# Patient Record
Sex: Male | Born: 1970 | Race: White | Hispanic: No | Marital: Single | Smoking: Current every day smoker
Health system: Southern US, Community
[De-identification: ages and names within clinical notes are randomized; demographics above are authoritative.]

## PROBLEM LIST (undated history)

## (undated) DIAGNOSIS — M549 Dorsalgia, unspecified: Secondary | ICD-10-CM

## (undated) DIAGNOSIS — C8590 Non-Hodgkin lymphoma, unspecified, unspecified site: Secondary | ICD-10-CM

## (undated) DIAGNOSIS — K219 Gastro-esophageal reflux disease without esophagitis: Secondary | ICD-10-CM

## (undated) DIAGNOSIS — C801 Malignant (primary) neoplasm, unspecified: Secondary | ICD-10-CM

## (undated) DIAGNOSIS — C8330 Diffuse large B-cell lymphoma, unspecified site: Secondary | ICD-10-CM

## (undated) DIAGNOSIS — IMO0002 Reserved for concepts with insufficient information to code with codable children: Secondary | ICD-10-CM

## (undated) DIAGNOSIS — M542 Cervicalgia: Secondary | ICD-10-CM

## (undated) DIAGNOSIS — IMO0001 Reserved for inherently not codable concepts without codable children: Secondary | ICD-10-CM

## (undated) DIAGNOSIS — S149XXA Injury of unspecified nerves of neck, initial encounter: Secondary | ICD-10-CM

## (undated) DIAGNOSIS — J189 Pneumonia, unspecified organism: Secondary | ICD-10-CM

## (undated) DIAGNOSIS — M898X9 Other specified disorders of bone, unspecified site: Secondary | ICD-10-CM

## (undated) DIAGNOSIS — G629 Polyneuropathy, unspecified: Secondary | ICD-10-CM

## (undated) DIAGNOSIS — G8929 Other chronic pain: Secondary | ICD-10-CM

## (undated) DIAGNOSIS — R1310 Dysphagia, unspecified: Secondary | ICD-10-CM

## (undated) DIAGNOSIS — G971 Other reaction to spinal and lumbar puncture: Secondary | ICD-10-CM

## (undated) DIAGNOSIS — G589 Mononeuropathy, unspecified: Secondary | ICD-10-CM

## (undated) DIAGNOSIS — R52 Pain, unspecified: Secondary | ICD-10-CM

## (undated) DIAGNOSIS — M545 Low back pain: Secondary | ICD-10-CM

## (undated) DIAGNOSIS — M792 Neuralgia and neuritis, unspecified: Secondary | ICD-10-CM

## (undated) HISTORY — DX: Reserved for concepts with insufficient information to code with codable children: IMO0002

## (undated) HISTORY — DX: Other reaction to spinal and lumbar puncture: G97.1

## (undated) HISTORY — PX: OTHER SURGICAL HISTORY: SHX169

## (undated) HISTORY — DX: Low back pain: M54.5

## (undated) HISTORY — PX: NO PAST SURGERIES: SHX2092

## (undated) HISTORY — DX: Other specified disorders of bone, unspecified site: M89.8X9

## (undated) HISTORY — DX: Reserved for inherently not codable concepts without codable children: IMO0001

## (undated) HISTORY — DX: Polyneuropathy, unspecified: G62.9

## (undated) HISTORY — DX: Neuralgia and neuritis, unspecified: M79.2

---

## 2000-08-08 ENCOUNTER — Encounter: Payer: Self-pay | Admitting: *Deleted

## 2000-08-08 ENCOUNTER — Emergency Department (HOSPITAL_COMMUNITY): Admission: EM | Admit: 2000-08-08 | Discharge: 2000-08-08 | Payer: Self-pay | Admitting: *Deleted

## 2000-08-09 ENCOUNTER — Ambulatory Visit (HOSPITAL_COMMUNITY): Admission: RE | Admit: 2000-08-09 | Discharge: 2000-08-09 | Payer: Self-pay | Admitting: Emergency Medicine

## 2000-08-09 ENCOUNTER — Encounter: Payer: Self-pay | Admitting: Emergency Medicine

## 2000-08-12 ENCOUNTER — Emergency Department (HOSPITAL_COMMUNITY): Admission: EM | Admit: 2000-08-12 | Discharge: 2000-08-12 | Payer: Self-pay | Admitting: Emergency Medicine

## 2001-11-23 ENCOUNTER — Encounter: Payer: Self-pay | Admitting: Emergency Medicine

## 2001-11-23 ENCOUNTER — Emergency Department (HOSPITAL_COMMUNITY): Admission: EM | Admit: 2001-11-23 | Discharge: 2001-11-23 | Payer: Self-pay | Admitting: Emergency Medicine

## 2001-11-24 ENCOUNTER — Ambulatory Visit (HOSPITAL_BASED_OUTPATIENT_CLINIC_OR_DEPARTMENT_OTHER): Admission: RE | Admit: 2001-11-24 | Discharge: 2001-11-24 | Payer: Self-pay | Admitting: Oral Surgery

## 2003-03-29 ENCOUNTER — Emergency Department (HOSPITAL_COMMUNITY): Admission: EM | Admit: 2003-03-29 | Discharge: 2003-03-29 | Payer: Self-pay | Admitting: Emergency Medicine

## 2005-07-15 ENCOUNTER — Emergency Department (HOSPITAL_COMMUNITY): Admission: EM | Admit: 2005-07-15 | Discharge: 2005-07-15 | Payer: Self-pay | Admitting: Emergency Medicine

## 2005-07-18 ENCOUNTER — Emergency Department (HOSPITAL_COMMUNITY): Admission: EM | Admit: 2005-07-18 | Discharge: 2005-07-18 | Payer: Self-pay | Admitting: Emergency Medicine

## 2010-07-27 ENCOUNTER — Emergency Department (HOSPITAL_COMMUNITY)
Admission: EM | Admit: 2010-07-27 | Discharge: 2010-07-27 | Disposition: A | Discharge status: Home / Self Care | Payer: Self-pay | Attending: Emergency Medicine | Admitting: Emergency Medicine

## 2010-07-27 DIAGNOSIS — F17200 Nicotine dependence, unspecified, uncomplicated: Secondary | ICD-10-CM | POA: Insufficient documentation

## 2010-07-27 DIAGNOSIS — Y92009 Unspecified place in unspecified non-institutional (private) residence as the place of occurrence of the external cause: Secondary | ICD-10-CM | POA: Insufficient documentation

## 2010-07-27 DIAGNOSIS — S0120XA Unspecified open wound of nose, initial encounter: Secondary | ICD-10-CM | POA: Insufficient documentation

## 2012-04-17 ENCOUNTER — Emergency Department (HOSPITAL_COMMUNITY)
Admission: EM | Admit: 2012-04-17 | Discharge: 2012-04-17 | Disposition: A | Discharge status: Home / Self Care | Payer: Medicaid Other | Attending: Emergency Medicine | Admitting: Emergency Medicine

## 2012-04-17 ENCOUNTER — Encounter (HOSPITAL_COMMUNITY): Payer: Self-pay | Admitting: Emergency Medicine

## 2012-04-17 ENCOUNTER — Emergency Department (HOSPITAL_COMMUNITY): Payer: Medicaid Other

## 2012-04-17 DIAGNOSIS — F17200 Nicotine dependence, unspecified, uncomplicated: Secondary | ICD-10-CM | POA: Insufficient documentation

## 2012-04-17 DIAGNOSIS — R1310 Dysphagia, unspecified: Secondary | ICD-10-CM | POA: Insufficient documentation

## 2012-04-17 DIAGNOSIS — R6883 Chills (without fever): Secondary | ICD-10-CM | POA: Insufficient documentation

## 2012-04-17 DIAGNOSIS — J36 Peritonsillar abscess: Secondary | ICD-10-CM | POA: Insufficient documentation

## 2012-04-17 LAB — POCT I-STAT, CHEM 8
Chloride: 99 mEq/L (ref 96–112)
HCT: 39 % (ref 39.0–52.0)
Hemoglobin: 13.3 g/dL (ref 13.0–17.0)
Potassium: 4.4 mEq/L (ref 3.5–5.1)
Sodium: 136 mEq/L (ref 135–145)

## 2012-04-17 LAB — CBC WITH DIFFERENTIAL/PLATELET
Basophils Absolute: 0 10*3/uL (ref 0.0–0.1)
Basophils Relative: 0 % (ref 0–1)
Hemoglobin: 13.2 g/dL (ref 13.0–17.0)
MCHC: 34.1 g/dL (ref 30.0–36.0)
Monocytes Relative: 8 % (ref 3–12)
Neutro Abs: 5.5 10*3/uL (ref 1.7–7.7)
Neutrophils Relative %: 74 % (ref 43–77)
Platelets: 281 10*3/uL (ref 150–400)

## 2012-04-17 MED ORDER — HYDROMORPHONE HCL PF 1 MG/ML IJ SOLN
1.0000 mg | Freq: Once | INTRAMUSCULAR | Status: AC
  Administered 2012-04-17: 1 mg via INTRAVENOUS
  Filled 2012-04-17: qty 1

## 2012-04-17 MED ORDER — TRAMADOL HCL 50 MG PO TABS: 50.0000 mg | ORAL_TABLET | Freq: Four times a day (QID) | ORAL | Status: DC | PRN

## 2012-04-17 MED ORDER — PREDNISONE 20 MG PO TABS: ORAL_TABLET | ORAL | Status: DC

## 2012-04-17 MED ORDER — DEXAMETHASONE SODIUM PHOSPHATE 10 MG/ML IJ SOLN
10.0000 mg | Freq: Once | INTRAMUSCULAR | Status: AC
  Administered 2012-04-17: 10 mg via INTRAVENOUS
  Filled 2012-04-17: qty 1

## 2012-04-17 MED ORDER — AMOXICILLIN 500 MG PO CAPS: 500.0000 mg | ORAL_CAPSULE | Freq: Three times a day (TID) | ORAL | Status: DC

## 2012-04-17 MED ORDER — DEXTROSE 5 % IV SOLN
750.0000 mg | Freq: Once | INTRAVENOUS | Status: AC
  Administered 2012-04-17: 750 mg via INTRAVENOUS
  Filled 2012-04-17: qty 750

## 2012-04-17 MED ORDER — IOHEXOL 300 MG/ML  SOLN
75.0000 mL | Freq: Once | INTRAMUSCULAR | Status: AC | PRN
  Administered 2012-04-17: 75 mL via INTRAVENOUS

## 2012-04-17 MED ORDER — SODIUM CHLORIDE 0.9 % IV BOLUS (SEPSIS)
1000.0000 mL | Freq: Once | INTRAVENOUS | Status: AC
  Administered 2012-04-17: 1000 mL via INTRAVENOUS

## 2012-04-17 NOTE — ED Provider Notes (Signed)
Medical screening examination/treatment/procedure(s) were performed by non-physician practitioner and as supervising physician I was immediately available for consultation/collaboration.   Dione Booze, MD 04/17/12 2024

## 2012-04-17 NOTE — ED Provider Notes (Signed)
History    This chart was scribed for non-physician practitioner working with Dione Booze, MD by Donne Anon, ED Scribe. This patient was seen in room TR11C/TR11C and the patient's care was started at 1632.   CSN: [id number]  Arrival date & time 04/17/12  1425   First MD Initiated Contact with Patient 04/17/12 1632      Chief Complaint  Patient presents with  . Sore Throat     The history is provided by the patient. No language interpreter was used.   Nicholas King is a 42 y.o. male who presents to the Emergency Department complaining of gradual onset, gradually worsening, throat pain which began a few days ago. He was seen at a hospital in [facility] 2 days ago where he was diagnosed "pus pockets" on his tonsils, verified by CT scan. They did not drain anything and he was referred to an ENT. He was prescribed hydrocodone and clindamycin and reports little relief. He reports associated chills and difficulty swallowing.  History reviewed. No pertinent past medical history.  Past Surgical History  Procedure Laterality Date  . Brain surgery      History reviewed. No pertinent family history.  History  Substance Use Topics  . Smoking status: Current Every Day Smoker    Types: Cigarettes  . Smokeless tobacco: Not on file  . Alcohol Use: Yes     Comment: 48oz beer/day      Review of Systems  Constitutional: Positive for chills. Negative for fever.  HENT: Positive for sore throat and trouble swallowing.   Gastrointestinal: Negative for vomiting.  All other systems reviewed and are negative.    Allergies  Review of patient's allergies indicates no known allergies.  Home Medications  No current outpatient prescriptions on file.  BP 126/85  Pulse 88  Temp(Src) 97.9 F (36.6 C) (Oral)  Resp 18  Wt 135 lb (61.236 kg)  SpO2 99%  Physical Exam  Nursing note and vitals reviewed. Constitutional: He is oriented to person, place, and time. He appears well-developed  and well-nourished. No distress.  Muffled voice.  HENT:  Head: Normocephalic and atraumatic.  Mouth/Throat: Oropharyngeal exudate present.  Tonsils enlarged and inflamed bilateral with exudate. White coating with an odor covering entire oropharynx.  Eyes: Conjunctivae and EOM are normal.  Neck: Normal range of motion. Neck supple.  Cardiovascular: Normal rate, regular rhythm and normal heart sounds.   Pulmonary/Chest: Effort normal and breath sounds normal.  Abdominal: Soft. Bowel sounds are normal. He exhibits no distension.  Musculoskeletal: Normal range of motion. He exhibits no edema.  Neurological: He is alert and oriented to person, place, and time.  Skin: Skin is warm and dry.  Psychiatric: He has a normal mood and affect. His behavior is normal.    ED Course  Procedures (including critical care time) DIAGNOSTIC STUDIES: Oxygen Saturation is 99% on room air, normal by my interpretation.    COORDINATION OF CARE: 4:38 PM Discussed treatment plan which includes antibiotics and referral to ENT specialist with pt at bedside and pt agreed to plan.     Labs Reviewed - No data to display Ct Soft Tissue Neck W Contrast  04/17/2012  *RADIOLOGY REPORT*  Clinical Data: Sore throat.  Rule out tonsillar abscess  CT NECK WITH CONTRAST  Technique:  Multidetector CT imaging of the neck was performed with intravenous contrast.  Contrast: 75mL OMNIPAQUE IOHEXOL 300 MG/ML  SOLN  Comparison: None.  Findings: There is asymmetric soft tissue swelling of the left tonsil.  No abscess is present.  Right tonsil is normal.  No pathologic adenopathy in the neck.  Scattered lymph nodes are present in the neck bilaterally without abscess formation or pathologic enlargement.  Parotid and submandibular glands are normal bilaterally.  Larynx is normal  13 mm irregular nodule right lower thyroid.  This could be benign and malignant.  Thyroid ultrasound is suggested for further evaluation.  Left thyroid is normal.   Lung apices are clear.  No acute bony abnormality.  IMPRESSION: Extensive soft tissue swelling of the left tonsil region without abscess.  Shotty lymph nodes in the neck bilaterally.  13 mm irregular nodule right thyroid.  Recommend thyroid ultrasound for further evaluation.   Original Report Authenticated By: Janeece Riggers, M.D.      1. Tonsillar abscess       MDM  CT scan from [facility] on March 3 showing left greater than right retropharyngeal abscess with probable rupture of left tonsillar abscess. On March 11 repeat CT scan shows left tonsillar enlargement and edema improved somewhat, otherwise stable appearance compared to March 3. No significant improvement in the soft tissue fullness in edema involving the left retropharyngeal space. Possibly retropharyngeal adenopathy or retropharyngeal abscess. I discussed this with Dr. Preston Fleeting and we feel a repeat CT scan today is appropriate. He has been on clindamycin without any change. I will begin IV cefuroxime. Am checking labs including CBC with differential and a mastectomy. Pending CT scan, I will consult ENT. 8:12 PM CT scan showing extensive soft tissue swelling of the left tonsil, however no abscess present. This is improvement from scan obtained on 3/11. Patient already aware of the thyroid nodule which were also noted on prior CT scan obtained earlier this month. He received IV antibiotics along with IV dexamethasone. He is able to speak in full sentences without difficulty and swallowing secretions well. I will discharge him home with amoxicillin and prednisone. He will followup with ENT. I discussed this case with Dr. Preston Fleeting who agrees with plan of care. No need for ENT to consult in the emergency department at this time. Return precautions discussed. Patient states understanding of plan and is agreeable. I personally performed the services described in this documentation, which was scribed in my presence. The recorded information  has been reviewed and is accurate.         Trevor Mace, PA-C 04/17/12 2014

## 2012-04-17 NOTE — ED Notes (Signed)
Pt seen at [facility] twice in last 2 wks with recommendation to have tonsils removed. Pt here for second opinion/follow up. Able to speak in complete sentences, good airway maintained.

## 2012-04-17 NOTE — ED Notes (Signed)
Pt states that he was visiting a friend out of town and went to the hospital dx with peritonsillar abscess and told to followup with ENT. Arrives stating he needs referral for an ENT here and sx have not begun to improve.

## 2012-04-20 ENCOUNTER — Emergency Department (HOSPITAL_COMMUNITY)
Admission: EM | Admit: 2012-04-20 | Discharge: 2012-04-20 | Disposition: A | Discharge status: Home / Self Care | Payer: Medicaid Other | Attending: Emergency Medicine | Admitting: Emergency Medicine

## 2012-04-20 ENCOUNTER — Encounter (HOSPITAL_COMMUNITY): Payer: Self-pay

## 2012-04-20 DIAGNOSIS — R21 Rash and other nonspecific skin eruption: Secondary | ICD-10-CM | POA: Insufficient documentation

## 2012-04-20 DIAGNOSIS — F17200 Nicotine dependence, unspecified, uncomplicated: Secondary | ICD-10-CM | POA: Insufficient documentation

## 2012-04-20 DIAGNOSIS — Z79899 Other long term (current) drug therapy: Secondary | ICD-10-CM | POA: Insufficient documentation

## 2012-04-20 DIAGNOSIS — R059 Cough, unspecified: Secondary | ICD-10-CM | POA: Insufficient documentation

## 2012-04-20 DIAGNOSIS — T4995XA Adverse effect of unspecified topical agent, initial encounter: Secondary | ICD-10-CM | POA: Insufficient documentation

## 2012-04-20 DIAGNOSIS — J390 Retropharyngeal and parapharyngeal abscess: Secondary | ICD-10-CM | POA: Insufficient documentation

## 2012-04-20 LAB — BASIC METABOLIC PANEL
BUN: 7 mg/dL (ref 6–23)
CO2: 29 mEq/L (ref 19–32)
Chloride: 96 mEq/L (ref 96–112)
Creatinine, Ser: 0.67 mg/dL (ref 0.50–1.35)
Potassium: 3.8 mEq/L (ref 3.5–5.1)

## 2012-04-20 LAB — CBC WITH DIFFERENTIAL/PLATELET
HCT: 37.5 % — ABNORMAL LOW (ref 39.0–52.0)
Hemoglobin: 12.3 g/dL — ABNORMAL LOW (ref 13.0–17.0)
Lymphocytes Relative: 7 % — ABNORMAL LOW (ref 12–46)
Monocytes Absolute: 0.2 10*3/uL (ref 0.1–1.0)
Monocytes Relative: 3 % (ref 3–12)
Neutro Abs: 6.9 10*3/uL (ref 1.7–7.7)
Neutrophils Relative %: 90 % — ABNORMAL HIGH (ref 43–77)
RBC: 4.44 MIL/uL (ref 4.22–5.81)
WBC: 7.7 10*3/uL (ref 4.0–10.5)

## 2012-04-20 MED ORDER — CEPHALEXIN 500 MG PO CAPS: 500.0000 mg | ORAL_CAPSULE | Freq: Two times a day (BID) | ORAL | Status: DC

## 2012-04-20 MED ORDER — DIPHENHYDRAMINE HCL 25 MG PO CAPS: 25.0000 mg | ORAL_CAPSULE | Freq: Four times a day (QID) | ORAL | Status: DC

## 2012-04-20 MED ORDER — SULFAMETHOXAZOLE-TMP DS 800-160 MG PO TABS
1.0000 | ORAL_TABLET | Freq: Once | ORAL | Status: AC
  Administered 2012-04-20: 1 via ORAL
  Filled 2012-04-20: qty 1

## 2012-04-20 MED ORDER — FAMOTIDINE 20 MG PO TABS: 20.0000 mg | ORAL_TABLET | Freq: Two times a day (BID) | ORAL | Status: DC

## 2012-04-20 MED ORDER — SULFAMETHOXAZOLE-TRIMETHOPRIM 800-160 MG PO TABS: 1.0000 | ORAL_TABLET | Freq: Two times a day (BID) | ORAL | Status: DC

## 2012-04-20 MED ORDER — LIDOCAINE VISCOUS 2 % MT SOLN: 20.0000 mL | OROMUCOSAL | Status: DC | PRN

## 2012-04-20 MED ORDER — FAMOTIDINE IN NACL 20-0.9 MG/50ML-% IV SOLN
20.0000 mg | Freq: Once | INTRAVENOUS | Status: AC
  Administered 2012-04-20: 20 mg via INTRAVENOUS
  Filled 2012-04-20: qty 50

## 2012-04-20 MED ORDER — LIDOCAINE VISCOUS 2 % MT SOLN
20.0000 mL | Freq: Once | OROMUCOSAL | Status: AC
  Administered 2012-04-20: 20 mL via OROMUCOSAL
  Filled 2012-04-20: qty 15

## 2012-04-20 MED ORDER — METHYLPREDNISOLONE SODIUM SUCC 125 MG IJ SOLR
125.0000 mg | Freq: Once | INTRAMUSCULAR | Status: AC
  Administered 2012-04-20: 125 mg via INTRAVENOUS
  Filled 2012-04-20: qty 2

## 2012-04-20 MED ORDER — CEPHALEXIN 500 MG PO CAPS
500.0000 mg | ORAL_CAPSULE | Freq: Once | ORAL | Status: AC
  Administered 2012-04-20: 500 mg via ORAL
  Filled 2012-04-20: qty 1

## 2012-04-20 MED ORDER — SODIUM CHLORIDE 0.9 % IV SOLN
Freq: Once | INTRAVENOUS | Status: AC
  Administered 2012-04-20: 21:00:00 via INTRAVENOUS

## 2012-04-20 NOTE — ED Provider Notes (Signed)
History     CSN: [id number]  Arrival date & time 04/20/12  1821   First MD Initiated Contact with Patient 04/20/12 1954      Chief Complaint  Patient presents with  . Allergic Reaction    (Consider location/radiation/quality/duration/timing/severity/associated sxs/prior treatment) HPI  The patient presents 3 days after an initial presentation to our network for sore throat, now with concern of continuing sore throat, as well as diffuse rash.  Prior to 2 weeks ago the patient was in his usual state of health.  About that time he developed sore throat, full sensation.  He went to another facility in an other area of the state.  He was diagnosed with bilateral retropharyngeal abscesses and started on oral antibiotics.  He notes that in the interval he continued to have sore throat, but no new fever, no new chest pain, dyspnea, syncope, vomiting, diarrhea. He was advised to follow up with an ENT specialist, but did not do so 3 days ago the patient started having a scattered, erythematous, pruritic rash on his left knee.  That day he presented to our other facility to 2 concerns of lack of progress of his sore throat.  That evaluation included a CT scan, which demonstrated diffuse inflammatory changes in the neck, patent airway, concern for infection.  Given the patient's patent airway, the absence of distress he was discharged with outpatient ENT followup. Now, over the past 2 days he developed a diffuse pruritic erythematous scattered minimally raised rash.  There are no lesions in the mucosa.  There is no fever, there is no dyspnea, there is no confusion.  No change with Benadryl or steroids.  The patient had been prescribed amoxicillin, and took one dose.  However, the patient states that his rash began prior to that individual dose.  History reviewed. No pertinent past medical history.  Past Surgical History  Procedure Laterality Date  . Brain surgery      No family history on  file.  History  Substance Use Topics  . Smoking status: Current Every Day Smoker    Types: Cigarettes  . Smokeless tobacco: Not on file  . Alcohol Use: Yes     Comment: 48oz beer/day      Review of Systems  Constitutional:       Per HPI, otherwise negative  HENT:       Per HPI, otherwise negative  Respiratory:       Per HPI, otherwise negative  Cardiovascular:       Per HPI, otherwise negative  Gastrointestinal: Negative for vomiting.  Endocrine:       Negative aside from HPI  Genitourinary:       Neg aside from HPI   Musculoskeletal:       Per HPI, otherwise negative  Skin: Positive for rash.  Neurological: Negative for syncope.    Allergies  Hydrocodone  Home Medications   Current Outpatient Rx  Name  Route  Sig  Dispense  Refill  . amoxicillin (AMOXIL) 500 MG capsule   Oral   Take 1 capsule (500 mg total) by mouth 3 (three) times daily.   21 capsule   0   . clindamycin (CLEOCIN) 150 MG capsule   Oral   Take 150-300 mg by mouth See admin instructions. 2 capsules three times a day for 2 days then 1 capsule 4 times a day for 8 days         . HYDROcodone-acetaminophen (NORCO/VICODIN) 5-325 MG per tablet   Oral  Take 1 tablet by mouth every 6 (six) hours as needed for pain.         . predniSONE (DELTASONE) 20 MG tablet      Take 3 tabs PO x 2 days followed by 2 tabs PO x 2 days followed by 1 tab PO x 2 days   12 tablet   0   . Pseudoeph-Doxylamine-DM-APAP (DAYQUIL/NYQUIL COLD/FLU RELIEF PO)   Oral   Take 1 capsule by mouth 2 (two) times daily as needed (for cold/flu symptoms).         . traMADol (ULTRAM) 50 MG tablet   Oral   Take 1 tablet (50 mg total) by mouth every 6 (six) hours as needed for pain.   15 tablet   0     BP 161/103  Pulse 114  Temp(Src) 98 F (36.7 C) (Oral)  Resp 24  Ht 5\' 8"  (1.727 m)  Wt 130 lb (58.968 kg)  BMI 19.77 kg/m2  SpO2 100%  Physical Exam  Nursing note and vitals reviewed. Constitutional: He is  oriented to person, place, and time. He appears well-developed and well-nourished.  Very thin male who appears older than stated age  HENT:  Head: Normocephalic and atraumatic.  Mouth/Throat: Oropharyngeal exudate present.  Tonsils enlarged and inflamed bilateral with exudate. White coating with an odor covering entire oropharynx.  Eyes: Conjunctivae and EOM are normal.  Neck: Neck supple. No tracheal deviation present. No thyromegaly present.  Cardiovascular: Normal rate, regular rhythm and normal heart sounds.   Pulmonary/Chest: Effort normal and breath sounds normal. No stridor.  Abdominal: Soft. Bowel sounds are normal. He exhibits no distension.  Musculoskeletal: Normal range of motion. He exhibits no edema.  Lymphadenopathy:    He has cervical adenopathy.  Neurological: He is alert and oriented to person, place, and time.  Skin: Skin is warm and dry. Rash noted. No purpura noted. Rash is maculopapular. Rash is not nodular and not vesicular. No cyanosis. Nails show no clubbing.  Diffuse maculopapular rash, with areas of confluence.  No drainage, no mucosal lesions  Psychiatric: He has a normal mood and affect. His behavior is normal.    ED Course  Procedures (including critical care time)  Following initial evaluation I reviewed the patient's chart, including looking at the CT scan from 3 days ago.  The patient has prior evidence of retropharyngeal abscess, though with seeming improvement of the abscess, with development of diffuse inflammatory changes.   Labs Reviewed  CBC WITH DIFFERENTIAL  BASIC METABOLIC PANEL  C-REACTIVE PROTEIN  LACTIC ACID, PLASMA   No results found.   No diagnosis found.  On repeat exam the patient's rash appears better with diminished erythema.  He is eating a cheeseburger.  10:28 PM Patient remains in no distress.  I shared all results with him and his family.  We discussed the working differential diagnosis and the need for f/u / return  precautions.  MDM  This patient presents with concern of rash, while also having a ongoing oral pharyngeal infection.  On exam the patient is afebrile, with clear phonation, no dysphagia, no dyspnea.  The patient is mildly tachycardic, though this improved with IV fluids.  Given his presentation there suspicion for bacteremia versus allergic reaction versus infectious exanthem.  With improvement following IV fluids, antihistamines, steroids, and no decompensation, with no leukocytosis, no lactic acidosis, no fever, there is low suspicion for acute systemic illness, the patient does need to followup with ENT, as scheduled for tomorrow.  Gerhard Munch, MD 04/20/12 2229

## 2012-04-20 NOTE — ED Notes (Addendum)
Patient presents to ER with c/o of an allergic reaction. Pt has significant hives/rash all over body that started on Thursday night. Pt began taking hydrocodone early last week but only took 2 pills. Pt went to [facility] this past Thursday night for a throat infection and was given amoxicillin and prednisone and since then the rash has spread all over his body. Pt is itching and burning all over. Denies chest pain. No audible wheezing.

## 2012-04-21 LAB — C-REACTIVE PROTEIN: CRP: 2.9 mg/dL — ABNORMAL HIGH

## 2012-04-28 ENCOUNTER — Encounter (HOSPITAL_COMMUNITY): Payer: Self-pay | Admitting: Pharmacy Technician

## 2012-04-28 ENCOUNTER — Other Ambulatory Visit: Payer: Self-pay | Admitting: Otolaryngology

## 2012-05-02 NOTE — Pre-Procedure Instructions (Signed)
ROLLO King  05/02/2012   Your procedure is scheduled on:  Thursday, April 3rd  Report to [facility] at 0530 AM.  Call this number if you have problems the morning of surgery: [phone]   Remember:   Do not eat food or drink liquids after midnight.    Take these medicines the morning of surgery with A SIP OF WATER: Prednisone, Pepcid   Do not wear jewelry, make-up or nail polish.  Do not wear lotions, powders, or perfumes,deodorant.  Do not shave 48 hours prior to surgery. Men may shave face and neck.  Do not bring valuables to the hospital.  Contacts, dentures or bridgework may not be worn into surgery.  Leave suitcase in the car. After surgery it may be brought to your room.  For patients admitted to the hospital, checkout time is 11:00 AM the day of discharge.   Patients discharged the day of surgery will not be allowed to drive home.    Special Instructions: Shower using CHG 2 nights before surgery and the night before surgery.  If you shower the day of surgery use CHG.  Use special wash - you have one bottle of CHG for all showers.  You should use approximately 1/3 of the bottle for each shower.   Please read over the following fact sheets that you were given: Pain Booklet, Coughing and Deep Breathing and Surgical Site Infection Prevention

## 2012-05-05 ENCOUNTER — Encounter (HOSPITAL_COMMUNITY)
Admission: RE | Admit: 2012-05-05 | Discharge: 2012-05-05 | Disposition: A | Discharge status: Home / Self Care | Payer: Medicaid Other | Source: Ambulatory Visit | Attending: Otolaryngology | Admitting: Otolaryngology

## 2012-05-05 ENCOUNTER — Encounter (HOSPITAL_COMMUNITY): Payer: Self-pay

## 2012-05-05 HISTORY — DX: Gastro-esophageal reflux disease without esophagitis: K21.9

## 2012-05-05 LAB — COMPREHENSIVE METABOLIC PANEL
ALT: 22 U/L (ref 0–53)
AST: 36 U/L (ref 0–37)
Albumin: 2.8 g/dL — ABNORMAL LOW (ref 3.5–5.2)
Alkaline Phosphatase: 51 U/L (ref 39–117)
Chloride: 101 mEq/L (ref 96–112)
Potassium: 4.7 mEq/L (ref 3.5–5.1)
Sodium: 139 mEq/L (ref 135–145)
Total Bilirubin: 0.3 mg/dL (ref 0.3–1.2)

## 2012-05-05 LAB — CBC
Platelets: 180 10*3/uL (ref 150–400)
RBC: 4.73 MIL/uL (ref 4.22–5.81)
RDW: 14.9 % (ref 11.5–15.5)
WBC: 5.2 10*3/uL (ref 4.0–10.5)

## 2012-05-05 LAB — SURGICAL PCR SCREEN: Staphylococcus aureus: NEGATIVE

## 2012-05-05 NOTE — Progress Notes (Addendum)
Primary Physician - Does not have a primary physician Does not have a cardiologist  No prior cardiac workup  Patient states he has lost 30 pounds over the last month and multiple questions regarding upcoming surgery and severe pain in throat.  Encouraged patient to call Dr. Jearld Fenton office.

## 2012-05-08 ENCOUNTER — Encounter (HOSPITAL_COMMUNITY): Payer: Self-pay | Admitting: Anesthesiology

## 2012-05-08 ENCOUNTER — Encounter (HOSPITAL_COMMUNITY): Payer: Self-pay | Admitting: *Deleted

## 2012-05-08 ENCOUNTER — Ambulatory Visit (HOSPITAL_COMMUNITY)
Admission: RE | Admit: 2012-05-08 | Discharge: 2012-05-08 | Disposition: A | Discharge status: Home / Self Care | Payer: Medicaid Other | Source: Ambulatory Visit | Attending: Otolaryngology | Admitting: Otolaryngology

## 2012-05-08 ENCOUNTER — Ambulatory Visit (HOSPITAL_COMMUNITY): Payer: Medicaid Other | Admitting: Anesthesiology

## 2012-05-08 ENCOUNTER — Encounter (HOSPITAL_COMMUNITY)
Admission: RE | Disposition: A | Discharge status: Home / Self Care | Payer: Self-pay | Source: Ambulatory Visit | Attending: Otolaryngology

## 2012-05-08 DIAGNOSIS — K219 Gastro-esophageal reflux disease without esophagitis: Secondary | ICD-10-CM | POA: Insufficient documentation

## 2012-05-08 DIAGNOSIS — J392 Other diseases of pharynx: Secondary | ICD-10-CM

## 2012-05-08 DIAGNOSIS — Z885 Allergy status to narcotic agent status: Secondary | ICD-10-CM | POA: Insufficient documentation

## 2012-05-08 DIAGNOSIS — R634 Abnormal weight loss: Secondary | ICD-10-CM | POA: Insufficient documentation

## 2012-05-08 DIAGNOSIS — C8589 Other specified types of non-Hodgkin lymphoma, extranodal and solid organ sites: Secondary | ICD-10-CM | POA: Insufficient documentation

## 2012-05-08 DIAGNOSIS — Z79899 Other long term (current) drug therapy: Secondary | ICD-10-CM | POA: Insufficient documentation

## 2012-05-08 DIAGNOSIS — F1210 Cannabis abuse, uncomplicated: Secondary | ICD-10-CM | POA: Insufficient documentation

## 2012-05-08 HISTORY — PX: DIRECT LARYNGOSCOPY: SHX5326

## 2012-05-08 SURGERY — LARYNGOSCOPY, DIRECT
Anesthesia: General | Site: Throat | Laterality: Left | Wound class: Contaminated

## 2012-05-08 MED ORDER — EPHEDRINE SULFATE 50 MG/ML IJ SOLN
INTRAMUSCULAR | Status: DC | PRN
  Administered 2012-05-08: 15 mg via INTRAVENOUS

## 2012-05-08 MED ORDER — EPINEPHRINE HCL (NASAL) 0.1 % NA SOLN
NASAL | Status: DC | PRN
  Administered 2012-05-08: 30 mL via TOPICAL

## 2012-05-08 MED ORDER — LIDOCAINE HCL (CARDIAC) 20 MG/ML IV SOLN
INTRAVENOUS | Status: DC | PRN
  Administered 2012-05-08: 70 mg via INTRAVENOUS

## 2012-05-08 MED ORDER — EPINEPHRINE HCL (NASAL) 0.1 % NA SOLN
NASAL | Status: AC
  Filled 2012-05-08: qty 30

## 2012-05-08 MED ORDER — DEXAMETHASONE SODIUM PHOSPHATE 4 MG/ML IJ SOLN
INTRAMUSCULAR | Status: DC | PRN
Start: 2012-05-08 — End: 2012-05-08
  Administered 2012-05-08: 8 mg via INTRAVENOUS

## 2012-05-08 MED ORDER — OXYCODONE-ACETAMINOPHEN 7.5-325 MG PO TABS: 1.0000 | ORAL_TABLET | ORAL | Status: DC | PRN

## 2012-05-08 MED ORDER — MIDAZOLAM HCL 5 MG/5ML IJ SOLN
INTRAMUSCULAR | Status: DC | PRN
  Administered 2012-05-08: 2 mg via INTRAVENOUS

## 2012-05-08 MED ORDER — HYDROMORPHONE HCL PF 1 MG/ML IJ SOLN
0.2500 mg | INTRAMUSCULAR | Status: DC | PRN
  Administered 2012-05-08 (×2): 0.5 mg via INTRAVENOUS

## 2012-05-08 MED ORDER — LIDOCAINE HCL 4 % MT SOLN
OROMUCOSAL | Status: DC | PRN
  Administered 2012-05-08: 4 mL via TOPICAL

## 2012-05-08 MED ORDER — FENTANYL CITRATE 0.05 MG/ML IJ SOLN
INTRAMUSCULAR | Status: DC | PRN
  Administered 2012-05-08: 125 ug via INTRAVENOUS

## 2012-05-08 MED ORDER — FENTANYL CITRATE 0.05 MG/ML IJ SOLN: 50.0000 ug | Freq: Once | INTRAMUSCULAR | Status: DC

## 2012-05-08 MED ORDER — ARTIFICIAL TEARS OP OINT
TOPICAL_OINTMENT | OPHTHALMIC | Status: DC | PRN
  Administered 2012-05-08: 1 via OPHTHALMIC

## 2012-05-08 MED ORDER — PROPOFOL 10 MG/ML IV BOLUS
INTRAVENOUS | Status: DC | PRN
  Administered 2012-05-08: 100 mg via INTRAVENOUS
  Administered 2012-05-08: 50 mg via INTRAVENOUS
  Administered 2012-05-08: 30 mg via INTRAVENOUS
  Administered 2012-05-08: 50 mg via INTRAVENOUS

## 2012-05-08 MED ORDER — HYDROMORPHONE HCL PF 1 MG/ML IJ SOLN
INTRAMUSCULAR | Status: AC
  Filled 2012-05-08: qty 1

## 2012-05-08 MED ORDER — SUCCINYLCHOLINE CHLORIDE 20 MG/ML IJ SOLN
INTRAMUSCULAR | Status: DC | PRN
  Administered 2012-05-08: 100 mg via INTRAVENOUS

## 2012-05-08 MED ORDER — OXYMETAZOLINE HCL 0.05 % NA SOLN
NASAL | Status: AC
  Filled 2012-05-08: qty 15

## 2012-05-08 MED ORDER — MIDAZOLAM HCL 2 MG/2ML IJ SOLN: 1.0000 mg | INTRAMUSCULAR | Status: DC | PRN

## 2012-05-08 MED ORDER — 0.9 % SODIUM CHLORIDE (POUR BTL) OPTIME
TOPICAL | Status: DC | PRN
  Administered 2012-05-08: 1000 mL

## 2012-05-08 MED ORDER — ONDANSETRON HCL 4 MG/2ML IJ SOLN
INTRAMUSCULAR | Status: DC | PRN
  Administered 2012-05-08: 4 mg via INTRAVENOUS

## 2012-05-08 MED ORDER — LACTATED RINGERS IV SOLN
INTRAVENOUS | Status: DC | PRN
  Administered 2012-05-08 (×2): via INTRAVENOUS

## 2012-05-08 SURGICAL SUPPLY — 31 items
BLADE SURG 15 STRL LF DISP TIS (BLADE) IMPLANT
BLADE SURG 15 STRL SS (BLADE)
CANISTER SUCTION 2500CC (MISCELLANEOUS) ×2 IMPLANT
CLOTH BEACON ORANGE TIMEOUT ST (SAFETY) ×2 IMPLANT
COAGULATOR SUCT SWTCH 10FR 6 (ELECTROSURGICAL) ×1 IMPLANT
CONT SPEC 4OZ CLIKSEAL STRL BL (MISCELLANEOUS) ×2 IMPLANT
COVER MAYO STAND STRL (DRAPES) ×3 IMPLANT
COVER TABLE BACK 60X90 (DRAPES) ×2 IMPLANT
DECANTER SPIKE VIAL GLASS SM (MISCELLANEOUS) ×2 IMPLANT
DRAPE PROXIMA HALF (DRAPES) ×2 IMPLANT
ELECT COATED BLADE 2.86 ST (ELECTRODE) IMPLANT
ELECT REM PT RETURN 9FT ADLT (ELECTROSURGICAL) ×2
ELECTRODE REM PT RTRN 9FT ADLT (ELECTROSURGICAL) IMPLANT
GLOVE BIO SURGEON STRL SZ7 (GLOVE) ×1 IMPLANT
GLOVE BIOGEL PI IND STRL 7.0 (GLOVE) IMPLANT
GLOVE BIOGEL PI INDICATOR 7.0 (GLOVE) ×1
GLOVE ECLIPSE 7.5 STRL STRAW (GLOVE) ×1 IMPLANT
GLOVE SS BIOGEL STRL SZ 7.5 (GLOVE) ×1 IMPLANT
GLOVE SUPERSENSE BIOGEL SZ 7.5 (GLOVE) ×1
GUARD TEETH (MISCELLANEOUS) ×1 IMPLANT
KIT ROOM TURNOVER OR (KITS) ×2 IMPLANT
NS IRRIG 1000ML POUR BTL (IV SOLUTION) ×2 IMPLANT
PAD ARMBOARD 7.5X6 YLW CONV (MISCELLANEOUS) ×4 IMPLANT
PATTIES SURGICAL .5 X3 (DISPOSABLE) ×2 IMPLANT
PENCIL FOOT CONTROL (ELECTRODE) IMPLANT
SPECIMEN JAR SMALL (MISCELLANEOUS) ×2 IMPLANT
SPONGE GAUZE 4X4 12PLY (GAUZE/BANDAGES/DRESSINGS) ×2 IMPLANT
SPONGE TONSIL 1 RF SGL (DISPOSABLE) ×2 IMPLANT
SURGILUBE 2OZ TUBE FLIPTOP (MISCELLANEOUS) ×2 IMPLANT
TOWEL OR 17X24 6PK STRL BLUE (TOWEL DISPOSABLE) ×4 IMPLANT
TUBE CONNECTING 12X1/4 (SUCTIONS) ×2 IMPLANT

## 2012-05-08 NOTE — Transfer of Care (Signed)
Immediate Anesthesia Transfer of Care Note  Patient: Nicholas King  Procedure(s) Performed: Procedure(s): DIRECT LARYNGOSCOPY with biopsy left tonsil (Left)  Patient Location: PACU  Anesthesia Type:General  Level of Consciousness: awake, alert , oriented and patient cooperative  Airway & Oxygen Therapy: Patient Spontanous Breathing and Patient connected to nasal cannula oxygen  Post-op Assessment: Report given to PACU RN and Post -op Vital signs reviewed and stable  Post vital signs: Reviewed and stable  Complications: No apparent anesthesia complications

## 2012-05-08 NOTE — Op Note (Signed)
Preop/postop diagnoses: Posterior and left pharyngeal mass Procedure: Direct laryngoscopy with biopsy of posterior pharyngeal mass Anesthesia: Gen. Estimated blood loss: Less than 10 cc Indications: 42 year old with a mass in his posterior pharyngeal wall that was treated with antibiotics through primary care physician and emergency room facilities. He underwent a CT scan which showed no evidence of any abscess but a mass in the left tonsil area and posterior pharyngeal wall. He's had significant pain and mostly left-sided ear pain. He is lost approximately 35 pounds. He was informed of the risks, benefits, and options of the direct laryngoscopy procedure. All his questions are answered and consent was obtained. Operation: Patient was taken to the operating room placed in the supine position after general endotracheal tube anesthesia the Dedo scope was inserted and the patient was evaluated. The hypopharynx had no evidence of any lesions. The post cricoid area looked without any lesions. The glottis had no evidence of mass on the vocal cords. The mass which was covered with an exudative material started approximately the level of the epiglottis on the posterior pharyngeal wall and extended up the lateral left pharyngeal wall then expanded cross the midline about the level the base of tongue and then extended up past the soft palate into the nasopharyngeal region. It was very firm and it did seem to be indurated type mass which means it did not have obvious exophytic tissue on its surface. This was evident once all the exudate material was suctioned off and cleaned off the surface. Biopsies were taken from the mid line mass area and sent for frozen section. Frozen section had atypical cells and they would not call it a malignancy but it had high suspicion. They wanted more tissue as a lymphoma was also suspected. Deeper biopsies were taken from the posterior pharyngeal wall with a much greater amount of tissue  harvested. The tissue seemed almost avascular as it bled very little from the biopsies. The bleeding areas were controlled with suction cautery. The examination of this area and all these biopsies was performed after placing a Crowe-Davis mouth gag. A patient of this area it did reveal the mass to extend deep and probably into the muscle area of the posterior pharyngeal wall into the left tonsil area and deep into the parapharyngeal space. Its upward extent into the nasopharynx was not assessed. He had good hemostasis after completion of the biopsies which were sent fresh and from both the left side and right side posterior pharynx. He was awake and brought to cover stable condition counts correct

## 2012-05-08 NOTE — Preoperative (Signed)
Beta Blockers   Reason not to administer Beta Blockers:Not Applicable 

## 2012-05-08 NOTE — Anesthesia Procedure Notes (Signed)
Procedure Name: Intubation Date/Time: 05/08/2012 7:48 AM Performed by: Tyrone Nine Pre-anesthesia Checklist: Patient identified, Timeout performed, Emergency Drugs available, Suction available and Patient being monitored Patient Re-evaluated:Patient Re-evaluated prior to inductionOxygen Delivery Method: Circle system utilized Preoxygenation: Pre-oxygenation with 100% oxygen Intubation Type: IV induction Ventilation: Mask ventilation without difficulty Laryngoscope Size: Mac and 3 Grade View: Grade II Tube type: Oral Tube size: 7.5 mm Number of attempts: 1 Airway Equipment and Method: Stylet Placement Confirmation: ETT inserted through vocal cords under direct vision,  positive ETCO2 and breath sounds checked- equal and bilateral Secured at: 21 cm Tube secured with: Tape Dental Injury: Teeth and Oropharynx as per pre-operative assessment  Comments: Pinched lip on intubation due to fractured carious tooth upper right.

## 2012-05-08 NOTE — Anesthesia Postprocedure Evaluation (Signed)
  Anesthesia Post-op Note  Patient: Nicholas King  Procedure(s) Performed: Procedure(s): DIRECT LARYNGOSCOPY with biopsy left tonsil (Left)  Patient Location: PACU  Anesthesia Type:General  Level of Consciousness: awake and alert   Airway and Oxygen Therapy: Patient Spontanous Breathing  Post-op Pain: mild  Post-op Assessment: Post-op Vital signs reviewed, Patient's Cardiovascular Status Stable, Respiratory Function Stable, Patent Airway, No signs of Nausea or vomiting and Pain level controlled  Post-op Vital Signs: stable  Complications: No apparent anesthesia complications

## 2012-05-08 NOTE — Anesthesia Preprocedure Evaluation (Addendum)
Anesthesia Evaluation  Patient identified by MRN, date of birth, ID band Patient awake    Reviewed: Allergy & Precautions, H&P , NPO status , Patient's Chart, lab work & pertinent test results  Airway Mallampati: I TM Distance: >3 FB Neck ROM: Limited    Dental  (+) Poor Dentition and Dental Advisory Given,    Pulmonary shortness of breath and with exertion, COPDCurrent Smoker,  + rhonchi         Cardiovascular Exercise Tolerance: Good Rhythm:Regular Rate:Normal  ? undx htn   Neuro/Psych negative neurological ROS  negative psych ROS   GI/Hepatic GERD-  Medicated and Controlled,Dysphagia   Endo/Other  negative endocrine ROS  Renal/GU negative Renal ROS     Musculoskeletal negative musculoskeletal ROS (+)   Abdominal   Peds  Hematology negative hematology ROS (+)   Anesthesia Other Findings Upper front Left central incisor and lateral incisor carious. Fragmented tooth upper lateral.  30 lb weight loss in last 2 weeks.  Difficulty swallowing.  Reproductive/Obstetrics                        Anesthesia Physical Anesthesia Plan  ASA: II  Anesthesia Plan: General   Post-op Pain Management:    Induction: Intravenous  Airway Management Planned: Oral ETT  Additional Equipment:   Intra-op Plan:   Post-operative Plan: Extubation in OR  Informed Consent: I have reviewed the patients History and Physical, chart, labs and discussed the procedure including the risks, benefits and alternatives for the proposed anesthesia with the patient or authorized representative who has indicated his/her understanding and acceptance.     Plan Discussed with: CRNA and Surgeon  Anesthesia Plan Comments:         Anesthesia Quick Evaluation

## 2012-05-08 NOTE — Progress Notes (Signed)
NOTIFIED DR. BYERS OF ORDERS NEEDING SIGNED.

## 2012-05-08 NOTE — H&P (Signed)
Nicholas King is an 42 y.o. male.   Chief Complaint: sore throat HPI: hx of mass in pharynx consistent with tumor  Past Medical History  Diagnosis Date  . GERD (gastroesophageal reflux disease)     Past Surgical History  Procedure Laterality Date  . No past surgeries      History reviewed. No pertinent family history. Social History:  reports that he has been smoking Cigarettes.  He has a 37.5 pack-year smoking history. He does not have any smokeless tobacco history on file. He reports that he uses illicit drugs (Marijuana). He reports that he does not drink alcohol.  Allergies:  Allergies  Allergen Reactions  . Hydrocodone Rash    Medications Prior to Admission  Medication Sig Dispense Refill  . cephALEXin (KEFLEX) 500 MG capsule Take 1 capsule (500 mg total) by mouth 2 (two) times daily.  14 capsule  0  . diphenhydrAMINE (BENADRYL) 25 mg capsule Take 1 capsule (25 mg total) by mouth every 6 (six) hours.  12 capsule  0  . lidocaine (XYLOCAINE) 2 % solution Take 20 mLs by mouth as needed for pain.  100 mL  0  . predniSONE (DELTASONE) 20 MG tablet Take 3 tabs PO x 2 days followed by 2 tabs PO x 2 days followed by 1 tab PO x 2 days  12 tablet  0  . sulfamethoxazole-trimethoprim (SEPTRA DS) 800-160 MG per tablet Take 1 tablet by mouth 2 (two) times daily.  14 tablet  0  . famotidine (PEPCID) 20 MG tablet Take 1 tablet (20 mg total) by mouth 2 (two) times daily.  6 tablet  0    No results found for this or any previous visit (from the past 48 hour(s)). No results found.  Review of Systems  Constitutional: Negative.   HENT: Positive for ear pain and sore throat.   Eyes: Negative.   Respiratory: Negative.   Cardiovascular: Negative.   Skin: Negative.     Blood pressure 111/73, pulse 69, temperature 97.3 F (36.3 C), temperature source Oral, resp. rate 20, SpO2 100.00%. Physical Exam  Constitutional: He appears well-developed.  HENT:  Head: Normocephalic.  Nose: Nose  normal.  Mouth/Throat: Oropharyngeal exudate present.  Large area in posterior pharynx consistent with mass  Cardiovascular: Normal rate.   Respiratory: Effort normal.     Assessment/Plan Pharyngeal mass- here for DL and have discussed the procedure  Suzanna Obey 05/08/2012, 7:29 AM

## 2012-05-09 ENCOUNTER — Encounter (HOSPITAL_COMMUNITY): Payer: Self-pay | Admitting: Otolaryngology

## 2012-05-14 ENCOUNTER — Telehealth: Payer: Self-pay | Admitting: Oncology

## 2012-05-14 NOTE — Telephone Encounter (Signed)
C/D 05/14/12 for appt. 05/15/12

## 2012-05-14 NOTE — Telephone Encounter (Signed)
S/W PT IN REF TO NP APPT. ON 05/15/12@1 :30 REFERRING DR BYERS DX-LYMPHOMA NO NEED TO SEND NP PACKET. PT COMING TOMORROW

## 2012-05-15 ENCOUNTER — Telehealth: Payer: Self-pay | Admitting: Oncology

## 2012-05-15 ENCOUNTER — Ambulatory Visit (HOSPITAL_BASED_OUTPATIENT_CLINIC_OR_DEPARTMENT_OTHER): Payer: Self-pay | Admitting: Oncology

## 2012-05-15 ENCOUNTER — Other Ambulatory Visit (HOSPITAL_BASED_OUTPATIENT_CLINIC_OR_DEPARTMENT_OTHER): Payer: Self-pay | Admitting: Lab

## 2012-05-15 ENCOUNTER — Encounter: Payer: Self-pay | Admitting: Oncology

## 2012-05-15 ENCOUNTER — Telehealth: Payer: Self-pay | Admitting: *Deleted

## 2012-05-15 ENCOUNTER — Ambulatory Visit: Payer: Self-pay

## 2012-05-15 VITALS — BP 110/70 | HR 110 | Temp 102.2°F | Resp 19 | Ht 67.0 in | Wt 96.1 lb

## 2012-05-15 DIAGNOSIS — C8589 Other specified types of non-Hodgkin lymphoma, extranodal and solid organ sites: Secondary | ICD-10-CM

## 2012-05-15 DIAGNOSIS — C8330 Diffuse large B-cell lymphoma, unspecified site: Secondary | ICD-10-CM

## 2012-05-15 DIAGNOSIS — K1379 Other lesions of oral mucosa: Secondary | ICD-10-CM

## 2012-05-15 DIAGNOSIS — E46 Unspecified protein-calorie malnutrition: Secondary | ICD-10-CM

## 2012-05-15 DIAGNOSIS — F17200 Nicotine dependence, unspecified, uncomplicated: Secondary | ICD-10-CM

## 2012-05-15 DIAGNOSIS — J36 Peritonsillar abscess: Secondary | ICD-10-CM

## 2012-05-15 LAB — LACTATE DEHYDROGENASE (CC13): LDH: 296 U/L — ABNORMAL HIGH (ref 125–245)

## 2012-05-15 MED ORDER — PREDNISONE 20 MG PO TABS: 40.0000 mg/m2 | ORAL_TABLET | Freq: Every day | ORAL | Status: DC

## 2012-05-15 MED ORDER — MORPHINE SULFATE ER 15 MG PO TBCR: 15.0000 mg | EXTENDED_RELEASE_TABLET | Freq: Two times a day (BID) | ORAL | Status: DC

## 2012-05-15 MED ORDER — OXYCODONE-ACETAMINOPHEN 7.5-325 MG PO TABS: 1.0000 | ORAL_TABLET | Freq: Four times a day (QID) | ORAL | Status: DC | PRN

## 2012-05-15 MED ORDER — LIDOCAINE-PRILOCAINE 2.5-2.5 % EX CREA: TOPICAL_CREAM | CUTANEOUS | Status: DC | PRN

## 2012-05-15 MED ORDER — ONDANSETRON HCL 8 MG PO TABS: 8.0000 mg | ORAL_TABLET | Freq: Two times a day (BID) | ORAL | Status: DC | PRN

## 2012-05-15 MED ORDER — PROCHLORPERAZINE MALEATE 10 MG PO TABS: 10.0000 mg | ORAL_TABLET | Freq: Four times a day (QID) | ORAL | Status: DC | PRN

## 2012-05-15 MED ORDER — AMOXICILLIN-POT CLAVULANATE 875-125 MG PO TABS: 1.0000 | ORAL_TABLET | Freq: Two times a day (BID) | ORAL | Status: DC

## 2012-05-15 NOTE — Telephone Encounter (Signed)
gv and printed appt schedule for pt for April...michelle added tx...gv echo order to Mrs. Bonita Quin...s/w Victorino Dike in IR she will gv msg to Marcy tomorrow and she will contact pt...pt aware cs will contact him with d/t of PET and Biopsy

## 2012-05-15 NOTE — Patient Instructions (Addendum)
1.  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   Lymphoma is not commonly known to be hereditary in most cases.  - Stage:  To be determined with further work up:  PET scan.  If PET scan shows stage 2 or 3 disease, I will then recommend bone marrow biopsy to rule out stage IV disease. - Prognosis:  To be determined with further staging work up.  The chance of cure can be up to 90% for early stage disease - Treatment:  Lymphoma is often not cured by surgery since it is often a systemic disease. Radiation if normally reserved for low grade, localized indolent lymphoma as upfront treatment or as consolidative therapy for bulky disease after chemotherapy.  - Chemotherapy:  R-CHOP chemo is given once every 3 weeks; with day #2 injection of Neulasta to decrease risk of infection.  This regimen contains:  Rituxan, Cytoxan, Adriamycin, Vincristine, Prednisone.  The first 4 agents are given intravenously on day 1 here at the [facility].  Prednisone pill is taken at home, in the morning, on days 1-5 (ONLY) of each chemo cycle.   - Potential side effects of R-CHOP which include but not limited to infusion reaction, rare seizure disorder, reactivated hepatitis, fatigue, low blood count, hair loss, mouth sore, infection, bleeding, irritated bladder lining with blood in urine, constipation, numbness and tingling of fingers and toes, secondary cancer, congestive heart failure.   - Number of cycles of chemo depends on stage/prognosis.  If stage I disease, then 3 cycles of chemo followed by radiation.  If stage III or IV disease, then chemo alone for at least 3 cycles without radiation.  - I also recommend intrathecal chemo methotrexate (in addition to R-CHOP) to decrease risk of lymphoma in the brain in the future.   - To assess response to chemo, a repeat scan will be obtained after a few cycles of chemo.   - In preparation for therapy:  * PET scan to complete staging work up.   * Bone marrow  biopsy to rule out lymphoma in bone marrow space.   * Referral to 2-D echocardiogram to assess baseline heart function.   * Referral to Interventional Radiology for Portacath placement.   * Referral to chemo class to learn about practical tips while on chemo.   * Pick up nausea medications from your preferred pharmacy.   - Follow up:  In about 10 days to go over result of work up and start chemo that day.

## 2012-05-15 NOTE — Progress Notes (Signed)
[facility]  [phone] [phone]     INITIAL HEMATOLOGY CONSULTATION    Referral MD:   Dr. Suzanna Obey, M.D.   Reason for Referral: newly diagnosed tonsil DLBCL.     HPI: Nicholas King is a 42 year-old man with no PMH.  He developed left side sore throat; progressive; 2 months. Hoarse voice. Dysphagia with solid (better with liquid).  40 lb weight loss 3 weeks. Boost x 1-2 cans/day.  Left neck node.  CT soft tissue the neck on 04/17/2012 showed extensive soft tissue swelling of the left tonsil without abscess. There were shotty lymph nodes in the neck bilaterally.   Biopsy by Dr. Jearld Fenton on 05/08/2012 showed large B-cell lymphoma in the posterior pharynx, and in the right posterior pharyngeal wall. He was kindly referred to the cancer Center for evaluation.  Nicholas King presented to the clinic for the first time today with his sister and his girl friend. He reported that he ran out of Percocet and is having moderate to severe pain when he swallows. He is able to eat much. He is only drinks about 1-2 cans of boost a day. He has fatigue and hasn't worked for the past 2 months due to be mouth pain. He denies palpable lymph node swelling anywhere else besides left neck. He has had low-grade persistent fever.  He is foul-smelling breath. He denies nausea vomiting, headache, vision changes, confusion, shortness of breath, chest pain, abdominal pain, melena, hematochezia, skin rash, low back pain, dysuria, pyuria, neuropathy, focal motor weakness. The rest of the 14 point review of system was negative.   Past Medical History  Diagnosis Date  . GERD (gastroesophageal reflux disease)   :    Past Surgical History  Procedure Laterality Date  . No past surgeries    . Direct laryngoscopy Left 05/08/2012    Procedure: DIRECT LARYNGOSCOPY with biopsy left tonsil;  Surgeon: Suzanna Obey, MD;  Location: [facility] OR;  Service: ENT;  Laterality: Left;  . Right eyebrown  bone    :   CURRENT MEDS: Current Outpatient Prescriptions  Medication Sig Dispense Refill  . diphenhydrAMINE (BENADRYL) 25 mg capsule Take 1 capsule (25 mg total) by mouth every 6 (six) hours.  12 capsule  0  . lidocaine (XYLOCAINE) 2 % solution Take 20 mLs by mouth as needed for pain.  100 mL  0  . oxyCODONE-acetaminophen (PERCOCET) 7.5-325 MG per tablet Take 1 tablet by mouth every 4 (four) hours as needed for pain.  30 tablet  0  . ondansetron (ZOFRAN) 8 MG tablet Take 1 tablet (8 mg total) by mouth every 12 (twelve) hours as needed. Take two times a day starting the day after chemo for 3 days. Then take two times a day as needed for nausea or vomiting.  30 tablet  1  . predniSONE (DELTASONE) 20 MG tablet Take 3 tablets (60 mg total) by mouth daily. Take on days 1-5 of chemotherapy.  45 tablet  0  . prochlorperazine (COMPAZINE) 10 MG tablet Take 1 tablet (10 mg total) by mouth every 6 (six) hours as needed (Nausea or vomiting).  30 tablet  3   No current facility-administered medications for this visit.      Allergies  Allergen Reactions  . Amoxicillin Rash  . Hydrocodone Rash  :  Family History  Problem Relation Age of Onset  . Cancer Father     lung  . Diabetes Father   . Cancer Maternal Aunt  gyn cancer, pancreas  . Cancer Paternal Aunt     pancreas cancer  . Cancer Paternal Uncle     unk  :  History   Social History  . Marital Status: Single    Spouse Name: N/A    Number of Children: 0  . Years of Education: N/A   Occupational History  .      [employer] [place]; maintenance   Social History Main Topics  . Smoking status: Current Every Day Smoker -- 1.50 packs/day for 25 years    Types: Cigarettes  . Smokeless tobacco: Not on file  . Alcohol Use: No  . Drug Use: Yes    Special: Marijuana     Comment: few days  . Sexually Active: No   Other Topics Concern  . Not on file   Social History Narrative  . No narrative on file  :  REVIEW OF SYSTEM:   The rest of the 14-point review of sytem was negative.   Exam: ECOG 1-2  General:  Thin, cachectic man, in no acute distress.  Eyes:  no scleral icterus.  ENT:  There was thickening of left tonsil.  There was foul odor.  Neck was without thyromegaly.  Lymphatics:  Negative for supraclavicular or axillary adenopathy. Positive for a 2cm left level II node.   Respiratory: lungs were clear bilaterally without wheezing or crackles.  Cardiovascular:  Regular rate and rhythm, S1/S2, without murmur, rub or gallop.  There was no pedal edema.  GI:  abdomen was soft, flat, nontender, nondistended, without organomegaly.  Muscoloskeletal:  no spinal tenderness of palpation of vertebral spine.  Skin exam was without echymosis, petichae.  Neuro exam was nonfocal.  Patient was able to get on and off exam table without assistance.  Gait was normal.  Patient was alerted and oriented.  Attention was good.   Language was appropriate.  Mood was normal without depression.  Speech was not pressured.  Thought content was not tangential.    LABS:  Lab Results  Component Value Date   WBC 5.2 05/05/2012   HGB 12.9* 05/05/2012   HCT 38.0* 05/05/2012   PLT 180 05/05/2012   GLUCOSE 90 05/05/2012   ALT 22 05/05/2012   AST 36 05/05/2012   NA 139 05/05/2012   K 4.7 05/05/2012   CL 101 05/05/2012   CREATININE 0.90 05/05/2012   BUN 25* 05/05/2012   CO2 30 05/05/2012    Ct Soft Tissue Neck W Contrast  04/17/2012  *RADIOLOGY REPORT*  Clinical Data: Sore throat.  Rule out tonsillar abscess  CT NECK WITH CONTRAST  Technique:  Multidetector CT imaging of the neck was performed with intravenous contrast.  Contrast: 75mL OMNIPAQUE IOHEXOL 300 MG/ML  SOLN  Comparison: None.  Findings: There is asymmetric soft tissue swelling of the left tonsil.  No abscess is present.  Right tonsil is normal.  No pathologic adenopathy in the neck.  Scattered lymph nodes are present in the neck bilaterally without abscess formation or pathologic enlargement.   Parotid and submandibular glands are normal bilaterally.  Larynx is normal  13 mm irregular nodule right lower thyroid.  This could be benign and malignant.  Thyroid ultrasound is suggested for further evaluation.  Left thyroid is normal.  Lung apices are clear.  No acute bony abnormality.  IMPRESSION: Extensive soft tissue swelling of the left tonsil region without abscess.  Shotty lymph nodes in the neck bilaterally.  13 mm irregular nodule right thyroid.  Recommend thyroid ultrasound for further  evaluation.   Original Report Authenticated By: Janeece Riggers, M.D.      ASSESSMENT AND PLAN:   1.  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   Lymphoma is not commonly known to be hereditary in most cases.  - Stage:  To be determined with further work up:  PET scan.  If PET scan shows stage 2 or 3 disease, I will then recommend bone marrow biopsy to rule out stage IV disease. - Prognosis:  To be determined with further staging work up.  The chance of cure can be up to 90% for early stage disease - Treatment:  Lymphoma is often not cured by surgery since it is often a systemic disease. Radiation if normally reserved for low grade, localized indolent lymphoma as upfront treatment or as consolidative therapy for bulky disease after chemotherapy.  - Chemotherapy:  R-CHOP chemo is given once every 3 weeks; with day #2 injection of Neulasta to decrease risk of infection.  This regimen contains:  Rituxan, Cytoxan, Adriamycin, Vincristine, Prednisone.  The first 4 agents are given intravenously on day 1 here at the [facility].  Prednisone pill is taken at home, in the morning, on days 1-5 (ONLY) of each chemo cycle.   - Potential side effects of R-CHOP which include but not limited to infusion reaction, rare seizure disorder, reactivated hepatitis, fatigue, low blood count, hair loss, mouth sore, infection, bleeding, irritated bladder lining with blood in urine, constipation, numbness and tingling  of fingers and toes, secondary cancer, congestive heart failure.   - Number of cycles of chemo depends on stage/prognosis.  If stage I disease, then 3 cycles of chemo followed by radiation.  If stage III or IV disease, then chemo alone for at least 3 cycles without radiation.  - I also recommend intrathecal chemo methotrexate (in addition to R-CHOP) to decrease risk of lymphoma in the brain in the future.   - To assess response to chemo, a repeat scan will be obtained after a few cycles of chemo.   - In preparation for therapy:  * PET scan to complete staging work up.   * Bone marrow biopsy to rule out lymphoma in bone marrow space.   * Referral to 2-D echocardiogram to assess baseline heart function.   * Referral to Interventional Radiology for Portacath placement.   * Referral to chemo class to learn about practical tips while on chemo.   * Pick up nausea medications from your preferred pharmacy.   - Follow up:  In about 10 days to go over result of work up and start chemo that day.   2.  Fever:  Most likely tumor fever.  However, given his foul-smelling breath, I prescribed Augmentin en case he is having abscess formation from rapid tumor progression.   3.  Moderate calorie protein malnutrition:  I referred him to Nutrition.  I advised him to increase his boost to 3-4 cans/day.  4.  Smoking:  I strongly urged him to stop smoking.    5.  Dysphagia, pain control:  I started him on MS Contin 15mg  PO BID and reserve Percocet every 6 hours prn breakthrough pain.   6.  Follow up:  In about 10 days to go over last minute question and start chemo.      The length of time of the face-to-face encounter was 60 minutes. More than 50% of time was spent counseling and coordination of care.     Thank you for this referral.

## 2012-05-15 NOTE — Telephone Encounter (Signed)
Per staff phone call and POF I have schedueld appts.  JMW  

## 2012-05-15 NOTE — Telephone Encounter (Signed)
lvm for pt regarding to 4.15 echo.Marland KitchenMarland KitchenMarland Kitchen

## 2012-05-15 NOTE — Progress Notes (Signed)
Checked in new pt with no insurance.  I gave him a Medicaid application and an EPP application.  He will turn in the [facility] application in [place].  I advised him to turn in completed application to Raquel for processing.

## 2012-05-16 ENCOUNTER — Other Ambulatory Visit: Payer: Self-pay

## 2012-05-16 ENCOUNTER — Emergency Department (HOSPITAL_COMMUNITY): Payer: Medicaid Other

## 2012-05-16 ENCOUNTER — Inpatient Hospital Stay (HOSPITAL_COMMUNITY): Payer: Medicaid Other

## 2012-05-16 ENCOUNTER — Inpatient Hospital Stay (HOSPITAL_COMMUNITY)
Admission: EM | Admit: 2012-05-16 | Discharge: 2012-05-20 | DRG: 177 | Disposition: A | Discharge status: Home / Self Care | Payer: Medicaid Other | Attending: Internal Medicine | Admitting: Internal Medicine

## 2012-05-16 ENCOUNTER — Encounter (HOSPITAL_COMMUNITY): Payer: Self-pay

## 2012-05-16 DIAGNOSIS — D649 Anemia, unspecified: Secondary | ICD-10-CM | POA: Diagnosis present

## 2012-05-16 DIAGNOSIS — Z681 Body mass index (BMI) 19 or less, adult: Secondary | ICD-10-CM

## 2012-05-16 DIAGNOSIS — T50905A Adverse effect of unspecified drugs, medicaments and biological substances, initial encounter: Secondary | ICD-10-CM

## 2012-05-16 DIAGNOSIS — R1310 Dysphagia, unspecified: Secondary | ICD-10-CM | POA: Diagnosis present

## 2012-05-16 DIAGNOSIS — F17200 Nicotine dependence, unspecified, uncomplicated: Secondary | ICD-10-CM | POA: Diagnosis present

## 2012-05-16 DIAGNOSIS — L270 Generalized skin eruption due to drugs and medicaments taken internally: Secondary | ICD-10-CM | POA: Diagnosis not present

## 2012-05-16 DIAGNOSIS — R21 Rash and other nonspecific skin eruption: Secondary | ICD-10-CM | POA: Diagnosis present

## 2012-05-16 DIAGNOSIS — K219 Gastro-esophageal reflux disease without esophagitis: Secondary | ICD-10-CM | POA: Diagnosis present

## 2012-05-16 DIAGNOSIS — T360X5A Adverse effect of penicillins, initial encounter: Secondary | ICD-10-CM | POA: Diagnosis not present

## 2012-05-16 DIAGNOSIS — C8589 Other specified types of non-Hodgkin lymphoma, extranodal and solid organ sites: Secondary | ICD-10-CM | POA: Diagnosis present

## 2012-05-16 DIAGNOSIS — R636 Underweight: Secondary | ICD-10-CM | POA: Diagnosis present

## 2012-05-16 DIAGNOSIS — B370 Candidal stomatitis: Secondary | ICD-10-CM | POA: Diagnosis present

## 2012-05-16 DIAGNOSIS — J15211 Pneumonia due to Methicillin susceptible Staphylococcus aureus: Principal | ICD-10-CM | POA: Diagnosis present

## 2012-05-16 DIAGNOSIS — Z8572 Personal history of non-Hodgkin lymphomas: Secondary | ICD-10-CM | POA: Diagnosis present

## 2012-05-16 DIAGNOSIS — R079 Chest pain, unspecified: Secondary | ICD-10-CM | POA: Diagnosis present

## 2012-05-16 DIAGNOSIS — F17210 Nicotine dependence, cigarettes, uncomplicated: Secondary | ICD-10-CM | POA: Diagnosis present

## 2012-05-16 DIAGNOSIS — E43 Unspecified severe protein-calorie malnutrition: Secondary | ICD-10-CM | POA: Diagnosis present

## 2012-05-16 DIAGNOSIS — C8330 Diffuse large B-cell lymphoma, unspecified site: Secondary | ICD-10-CM

## 2012-05-16 DIAGNOSIS — D630 Anemia in neoplastic disease: Secondary | ICD-10-CM | POA: Diagnosis present

## 2012-05-16 DIAGNOSIS — J984 Other disorders of lung: Secondary | ICD-10-CM | POA: Diagnosis present

## 2012-05-16 DIAGNOSIS — E41 Nutritional marasmus: Secondary | ICD-10-CM | POA: Diagnosis present

## 2012-05-16 DIAGNOSIS — R64 Cachexia: Secondary | ICD-10-CM | POA: Diagnosis present

## 2012-05-16 DIAGNOSIS — E041 Nontoxic single thyroid nodule: Secondary | ICD-10-CM | POA: Diagnosis present

## 2012-05-16 DIAGNOSIS — R739 Hyperglycemia, unspecified: Secondary | ICD-10-CM | POA: Insufficient documentation

## 2012-05-16 DIAGNOSIS — R509 Fever, unspecified: Secondary | ICD-10-CM | POA: Diagnosis present

## 2012-05-16 HISTORY — DX: Non-Hodgkin lymphoma, unspecified, unspecified site: C85.90

## 2012-05-16 HISTORY — DX: Dysphagia, unspecified: R13.10

## 2012-05-16 HISTORY — DX: Malignant (primary) neoplasm, unspecified: C80.1

## 2012-05-16 LAB — URINE CULTURE
Colony Count: NO GROWTH
Culture: NO GROWTH

## 2012-05-16 LAB — CBC
HCT: 35.5 % — ABNORMAL LOW (ref 39.0–52.0)
Hemoglobin: 11.7 g/dL — ABNORMAL LOW (ref 13.0–17.0)
MCH: 27.1 pg (ref 26.0–34.0)
MCHC: 33 g/dL (ref 30.0–36.0)
MCV: 82.4 fL (ref 78.0–100.0)
Platelets: 300 10*3/uL (ref 150–400)
RBC: 3.54 MIL/uL — ABNORMAL LOW (ref 4.22–5.81)
RDW: 15 % (ref 11.5–15.5)
WBC: 7.6 10*3/uL (ref 4.0–10.5)

## 2012-05-16 LAB — BASIC METABOLIC PANEL
BUN: 16 mg/dL (ref 6–23)
Calcium: 8.6 mg/dL (ref 8.4–10.5)
Creatinine, Ser: 0.92 mg/dL (ref 0.50–1.35)
GFR calc non Af Amer: >90 mL/min (ref >90)
Glucose, Bld: 99 mg/dL (ref 70–99)
Potassium: 4.8 mEq/L (ref 3.5–5.1)

## 2012-05-16 LAB — CREATININE, SERUM
Creatinine, Ser: 0.72 mg/dL (ref 0.50–1.35)
GFR calc Af Amer: >90 mL/min (ref >90)
GFR calc non Af Amer: >90 mL/min (ref >90)

## 2012-05-16 LAB — PROTIME-INR: Prothrombin Time: 16.6 seconds — ABNORMAL HIGH (ref 11.6–15.2)

## 2012-05-16 LAB — URINALYSIS, ROUTINE W REFLEX MICROSCOPIC
Glucose, UA: NEGATIVE mg/dL
Ketones, ur: NEGATIVE mg/dL
Leukocytes, UA: NEGATIVE
Protein, ur: 30 mg/dL — AB
Urobilinogen, UA: 2 mg/dL — ABNORMAL HIGH (ref 0.0–1.0)

## 2012-05-16 LAB — HEPATITIS B SURFACE ANTIBODY,QUALITATIVE: Hep B S Ab: NONREACTIVE

## 2012-05-16 LAB — MAGNESIUM: Magnesium: 1.8 mg/dL (ref 1.5–2.5)

## 2012-05-16 LAB — PHOSPHORUS: Phosphorus: 3 mg/dL (ref 2.3–4.6)

## 2012-05-16 LAB — HEPATITIS B SURFACE ANTIGEN: Hepatitis B Surface Ag: NEGATIVE

## 2012-05-16 LAB — HEPATIC FUNCTION PANEL
Bilirubin, Direct: 0.1 mg/dL (ref 0.0–0.3)
Indirect Bilirubin: 0.2 mg/dL — ABNORMAL LOW (ref 0.3–0.9)

## 2012-05-16 LAB — URINE MICROSCOPIC-ADD ON

## 2012-05-16 LAB — T3, FREE: T3, Free: 2.4 pg/mL (ref 2.3–4.2)

## 2012-05-16 LAB — TSH: TSH: 3.454 u[IU]/mL (ref 0.350–4.500)

## 2012-05-16 LAB — TROPONIN I: Troponin I: <0.3 ng/mL (ref <0.30)

## 2012-05-16 LAB — APTT: aPTT: 47 seconds — ABNORMAL HIGH (ref 24–37)

## 2012-05-16 LAB — BETA 2 MICROGLOBULIN, SERUM: Beta-2 Microglobulin: 3.6 mg/L — ABNORMAL HIGH (ref 1.01–1.73)

## 2012-05-16 LAB — T3: T3, Total: 63.5 ng/dl — ABNORMAL LOW (ref 80.0–204.0)

## 2012-05-16 MED ORDER — SODIUM CHLORIDE 0.9 % IV BOLUS (SEPSIS)
1000.0000 mL | Freq: Once | INTRAVENOUS | Status: AC
  Administered 2012-05-16: 1000 mL via INTRAVENOUS

## 2012-05-16 MED ORDER — FLUCONAZOLE IN SODIUM CHLORIDE 200-0.9 MG/100ML-% IV SOLN
200.0000 mg | Freq: Once | INTRAVENOUS | Status: AC
  Administered 2012-05-16: 200 mg via INTRAVENOUS
  Filled 2012-05-16: qty 100

## 2012-05-16 MED ORDER — MORPHINE SULFATE 2 MG/ML IJ SOLN
1.0000 mg | INTRAMUSCULAR | Status: DC | PRN
  Administered 2012-05-16: 1 mg via INTRAVENOUS
  Filled 2012-05-16: qty 1

## 2012-05-16 MED ORDER — ALUM & MAG HYDROXIDE-SIMETH 200-200-20 MG/5ML PO SUSP: 30.0000 mL | Freq: Four times a day (QID) | ORAL | Status: DC | PRN

## 2012-05-16 MED ORDER — BOOST PLUS PO LIQD
237.0000 mL | Freq: Four times a day (QID) | ORAL | Status: DC
  Administered 2012-05-16 – 2012-05-19 (×10): 237 mL via ORAL
  Filled 2012-05-16 (×19): qty 237

## 2012-05-16 MED ORDER — SORBITOL 70 % SOLN: 30.0000 mL | Freq: Every day | Status: DC | PRN

## 2012-05-16 MED ORDER — IOHEXOL 300 MG/ML  SOLN
80.0000 mL | Freq: Once | INTRAMUSCULAR | Status: AC | PRN
  Administered 2012-05-16: 80 mL via INTRAVENOUS

## 2012-05-16 MED ORDER — PROCHLORPERAZINE MALEATE 10 MG PO TABS: 10.0000 mg | ORAL_TABLET | Freq: Four times a day (QID) | ORAL | Status: DC | PRN

## 2012-05-16 MED ORDER — FLUCONAZOLE 40 MG/ML PO SUSR
100.0000 mg | Freq: Every day | ORAL | Status: DC
  Administered 2012-05-17 – 2012-05-20 (×4): 100 mg via ORAL
  Filled 2012-05-16 (×4): qty 2.5

## 2012-05-16 MED ORDER — MORPHINE SULFATE 4 MG/ML IJ SOLN
4.0000 mg | Freq: Once | INTRAMUSCULAR | Status: AC
  Administered 2012-05-16: 4 mg via INTRAVENOUS
  Filled 2012-05-16: qty 1

## 2012-05-16 MED ORDER — MAGIC MOUTHWASH W/LIDOCAINE
15.0000 mL | Freq: Four times a day (QID) | ORAL | Status: DC | PRN
  Administered 2012-05-16 – 2012-05-17 (×2): 15 mL via ORAL
  Filled 2012-05-16 (×2): qty 15

## 2012-05-16 MED ORDER — CLINDAMYCIN PHOSPHATE 900 MG/50ML IV SOLN
900.0000 mg | Freq: Three times a day (TID) | INTRAVENOUS | Status: DC
  Administered 2012-05-16 – 2012-05-20 (×12): 900 mg via INTRAVENOUS
  Filled 2012-05-16 (×14): qty 50

## 2012-05-16 MED ORDER — ENOXAPARIN SODIUM 40 MG/0.4ML ~~LOC~~ SOLN
40.0000 mg | SUBCUTANEOUS | Status: DC
  Administered 2012-05-16: 40 mg via SUBCUTANEOUS
  Filled 2012-05-16: qty 0.4

## 2012-05-16 MED ORDER — IPRATROPIUM BROMIDE 0.02 % IN SOLN: 0.5000 mg | RESPIRATORY_TRACT | Status: DC | PRN

## 2012-05-16 MED ORDER — SODIUM CHLORIDE 0.9 % IV SOLN
500.0000 mg | Freq: Two times a day (BID) | INTRAVENOUS | Status: DC
  Administered 2012-05-16 – 2012-05-19 (×7): 500 mg via INTRAVENOUS
  Filled 2012-05-16 (×8): qty 500

## 2012-05-16 MED ORDER — SODIUM CHLORIDE 0.9 % IV SOLN
1000.0000 mL | INTRAVENOUS | Status: DC
  Administered 2012-05-16 – 2012-05-18 (×6): 1000 mL via INTRAVENOUS

## 2012-05-16 MED ORDER — ALBUTEROL SULFATE (5 MG/ML) 0.5% IN NEBU: 2.5000 mg | INHALATION_SOLUTION | RESPIRATORY_TRACT | Status: DC | PRN

## 2012-05-16 MED ORDER — CEFTRIAXONE SODIUM 1 G IJ SOLR
1.0000 g | INTRAMUSCULAR | Status: DC
  Administered 2012-05-16 – 2012-05-18 (×3): 1 g via INTRAVENOUS
  Filled 2012-05-16 (×5): qty 10

## 2012-05-16 MED ORDER — NICOTINE 21 MG/24HR TD PT24
21.0000 mg | MEDICATED_PATCH | Freq: Every day | TRANSDERMAL | Status: DC
  Administered 2012-05-16 – 2012-05-20 (×5): 21 mg via TRANSDERMAL
  Filled 2012-05-16 (×5): qty 1

## 2012-05-16 MED ORDER — SODIUM CHLORIDE 0.9 % IV SOLN
1000.0000 mL | Freq: Once | INTRAVENOUS | Status: AC
  Administered 2012-05-16: 1000 mL via INTRAVENOUS

## 2012-05-16 MED ORDER — ENSURE PUDDING PO PUDG
1.0000 | Freq: Three times a day (TID) | ORAL | Status: DC
  Administered 2012-05-17 – 2012-05-18 (×3): 1 via ORAL
  Filled 2012-05-16 (×13): qty 1

## 2012-05-16 MED ORDER — OXYCODONE HCL 5 MG PO TABS: 2.5000 mg | ORAL_TABLET | ORAL | Status: DC | PRN

## 2012-05-16 MED ORDER — PANTOPRAZOLE SODIUM 40 MG PO TBEC
40.0000 mg | DELAYED_RELEASE_TABLET | Freq: Every day | ORAL | Status: DC
  Administered 2012-05-16 – 2012-05-20 (×5): 40 mg via ORAL
  Filled 2012-05-16 (×5): qty 1

## 2012-05-16 MED ORDER — FLEET ENEMA 7-19 GM/118ML RE ENEM: 1.0000 | ENEMA | Freq: Once | RECTAL | Status: AC | PRN

## 2012-05-16 MED ORDER — OXYCODONE-ACETAMINOPHEN 7.5-325 MG PO TABS: 1.0000 | ORAL_TABLET | ORAL | Status: DC | PRN

## 2012-05-16 MED ORDER — ONDANSETRON HCL 4 MG/2ML IJ SOLN: 4.0000 mg | Freq: Four times a day (QID) | INTRAMUSCULAR | Status: DC | PRN

## 2012-05-16 MED ORDER — ACETAMINOPHEN 650 MG RE SUPP: 650.0000 mg | Freq: Four times a day (QID) | RECTAL | Status: DC | PRN

## 2012-05-16 MED ORDER — ADULT MULTIVITAMIN W/MINERALS CH: 1.0000 | ORAL_TABLET | Freq: Every day | ORAL | Status: DC

## 2012-05-16 MED ORDER — POLYETHYLENE GLYCOL 3350 17 G PO PACK
17.0000 g | PACK | Freq: Every day | ORAL | Status: DC | PRN
  Filled 2012-05-16: qty 1

## 2012-05-16 MED ORDER — ACETAMINOPHEN 325 MG PO TABS: 650.0000 mg | ORAL_TABLET | Freq: Four times a day (QID) | ORAL | Status: DC | PRN

## 2012-05-16 MED ORDER — ONDANSETRON HCL 4 MG PO TABS: 4.0000 mg | ORAL_TABLET | Freq: Four times a day (QID) | ORAL | Status: DC | PRN

## 2012-05-16 MED ORDER — LEVOFLOXACIN IN D5W 500 MG/100ML IV SOLN
500.0000 mg | Freq: Once | INTRAVENOUS | Status: AC
  Administered 2012-05-16: 500 mg via INTRAVENOUS
  Filled 2012-05-16: qty 100

## 2012-05-16 MED ORDER — MORPHINE SULFATE 2 MG/ML IJ SOLN
1.0000 mg | INTRAMUSCULAR | Status: DC | PRN
  Administered 2012-05-16 – 2012-05-17 (×4): 2 mg via INTRAVENOUS
  Filled 2012-05-16 (×4): qty 1

## 2012-05-16 MED ORDER — OXYCODONE-ACETAMINOPHEN 5-325 MG PO TABS: 1.0000 | ORAL_TABLET | ORAL | Status: DC | PRN

## 2012-05-16 MED ORDER — DOCUSATE SODIUM 100 MG PO CAPS
100.0000 mg | ORAL_CAPSULE | Freq: Two times a day (BID) | ORAL | Status: DC
  Administered 2012-05-16 – 2012-05-17 (×3): 100 mg via ORAL
  Filled 2012-05-16 (×5): qty 1

## 2012-05-16 MED ORDER — MORPHINE SULFATE ER 15 MG PO TBCR: 15.0000 mg | EXTENDED_RELEASE_TABLET | Freq: Two times a day (BID) | ORAL | Status: DC

## 2012-05-16 MED ORDER — ADULT MULTIVITAMIN LIQUID CH
5.0000 mL | Freq: Every day | ORAL | Status: DC
  Administered 2012-05-16 – 2012-05-20 (×5): 5 mL via ORAL
  Filled 2012-05-16 (×5): qty 5

## 2012-05-16 NOTE — ED Notes (Signed)
Notified by [name] that she is unable to do CT scan at this time.

## 2012-05-16 NOTE — H&P (Signed)
[facility] History and Physical  Nicholas King BJY:[id number] DOB: March 12, 1970 DOA: 05/16/2012  Referring physician: Dr Devoria Albe PCP: No PCP Per Patient  Specialists: Oncology: Dr Gaylyn Rong ENT: Dr Jearld Fenton  Chief Complaint: chest pain  HPI: Nicholas King is a 42 y.o. 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 Patient states that he developed left-sided sore throat which has been progressive in nature over the past 2 months with a hoarse voice and dysphagia to solid foods. Patient also had an approximately 40 pound weight loss over the past 3 weeks and is on boost supplements. Patient was noted per CT of the soft tissue neck on 04/17/2012 to have extensive soft tissue swelling of the left tonsil without abscess. Patient was also noted to have shotty lymph nodes in the neck bilaterally. Patient underwent a biopsy per Dr. Jearld Fenton on 05/08/2012 which showed a large B-cell lymphoma in the posterior pharynx and in the right posterior pharyngeal wall. Patient was seen in consultation for initial oncology consult by Dr. Carney Bern 1 day prior to admission at which point in time patient was noted to be having fevers, foul-smelling breath and placed empirically on Augmentin for possible abscess formation. Patient was getting preparation to start therapy with PET scan for staging workup, bone marrow biopsy to rule out lymphoma in the bone marrow space, her for 2-D echo to assess baseline heart function, referral to interventional radiology for Port-A-Cath placement, referral to chemotherapy class and antinausea medications per Dr. Gaylyn Rong. Patient states that he took one pill of Augmentin and developed a rash on his knees. Patient does state that the chest pain he developed awoke him on the morning of admission it was  sharp as well as dull in nature left-sided nonradiating lasting several hours. Patient does endorse fevers with temperatures going up as high as 103.4 and as low as 101, night sweats, weight loss, some associated shortness of breath. Patient also endorses an occasional productive cough of yellowish to whitish sputum. Patient does endorse some chills. Patient denies any abdominal pain, no nausea, no vomiting, no palpitations, no diarrhea, no constipation, no melena, no hematemesis, no hematochezia. Patient does endorse generalized weakness and significantly decreased oral intake. Patient was given some morphine in the ED with resolution of his chest pain. In the ED chest x-ray which was done showed focal airspace opacification in the left upper lung zone suspicious for mass. Suggestion of associated cavitation. CT of the chest which was done showed multiple Shiley cavitary lesions within the left upper lobe measuring up to 3.9 cm in size. Patchy airspace opacity extending posterior left hilum with nearly entirely cavitary 8mm focus in the left lung apex. Peripheral nodules also noted within the right lung. Mediastinal and bilateral hilar lymphadenopathy with nodes measuring up to 1.5 cm also noted. A 1.5 cm hypodensity within the right thyroid lobe was also noted. ED physician called for admission and further evaluation and management due to concern for possible tuberculosis.   Review of Systems: The patient denies anorexia, fever, weight loss,, vision loss, decreased hearing, hoarseness, chest pain, syncope, dyspnea on exertion, peripheral edema, balance deficits, hemoptysis, abdominal pain, melena, hematochezia, severe indigestion/heartburn, hematuria, incontinence, genital sores, muscle weakness, suspicious skin lesions, transient blindness, difficulty walking, depression, unusual weight change, abnormal bleeding, enlarged lymph nodes, angioedema, and breast masses.  Past Medical History  Diagnosis Date   . GERD (gastroesophageal reflux disease)   . Cancer   . Lymphoma   . Dysphagia, unspecified 05/16/2012   Past Surgical History  Procedure Laterality Date  . No past surgeries    . Direct laryngoscopy Left 05/08/2012    Procedure: DIRECT LARYNGOSCOPY with biopsy left tonsil;  Surgeon: Suzanna Obey, MD;  Location: [facility] OR;  Service: ENT;  Laterality: Left;  . Right eyebrown bone     Social History:  reports that he has been smoking Cigarettes.  He has a 37.5 pack-year smoking history. He does not have any smokeless tobacco history on file. He reports that he uses illicit drugs (Marijuana). He reports that he does not drink alcohol.  Allergies  Allergen Reactions  . Penicillins   . Amoxicillin Rash  . Hydrocodone Rash    Family History  Problem Relation Age of Onset  . Cancer Father     lung  . Diabetes Father   . Cancer Maternal Aunt     gyn cancer, pancreas  . Cancer Paternal Aunt     pancreas cancer  . Cancer Paternal Uncle     unk    Prior to Admission medications   Medication Sig Start Date End Date Taking? Authorizing Provider  amoxicillin-clavulanate (AUGMENTIN) 875-125 MG per tablet Take 1 tablet by mouth 2 (two) times daily. 05/15/12  Yes Exie Parody, MD  oxyCODONE-acetaminophen (PERCOCET) 7.5-325 MG per tablet Take 1 tablet by mouth every 6 (six) hours as needed for pain. 05/15/12  Yes Exie Parody, MD  lidocaine-prilocaine (EMLA) cream Apply topically as needed. 05/15/12   Exie Parody, MD  morphine (MS CONTIN) 15 MG 12 hr tablet Take 1 tablet (15 mg total) by mouth 2 (two) times daily. 05/15/12   Exie Parody, MD  ondansetron (ZOFRAN) 8 MG tablet Take 1 tablet (8 mg total) by mouth every 12 (twelve) hours as needed. Take two times a day starting the day after chemo for 3 days. Then take two times a day as needed for nausea or vomiting. 05/15/12   Exie Parody, MD  predniSONE (DELTASONE) 20 MG tablet Take 3 tablets (60 mg total) by mouth daily. Take on days 1-5 of chemotherapy. 05/15/12    Exie Parody, MD  prochlorperazine (COMPAZINE) 10 MG tablet Take 1 tablet (10 mg total) by mouth every 6 (six) hours as needed (Nausea or vomiting). 05/15/12   Exie Parody, MD   Physical Exam: Filed Vitals:   05/16/12 1610 05/16/12 0831 05/16/12 0900 05/16/12 1036  BP:  90/54 95/57 109/58  Pulse: 95 80 80 87  Temp: 101.2 F (38.4 C) 99.3 F (37.4 C)  99.3 F (37.4 C)  TempSrc: Oral Oral    Resp: 20 20 16 18   Height:    5\' 7"  (1.702 m)  Weight:    45.133 kg (99 lb 8 oz)  SpO2: 98% 98% 99% 96%     General:  Cachectic in no acute cardiopulmonary distress.  Eyes: Pupils equal round and reactive to light and accommodation. No scleral icterus.  ENT: Oropharynx with a foul odor, some thickening of the left lung chills, thrush, whitish exudate in the posterior pharynx. Dry mucous membranes.  Neck: Supple with no lymphadenopathy.  Cardiovascular: Regular rate rhythm no murmurs rubs or gallops.  Respiratory: Clear to auscultation bilaterally. No wheezes. No crackles. No rhonchi.  Abdomen: Scaphoid. Nondistended, nontender, soft, positive bowel sounds.  Skin: No rashes or lesions.  Musculoskeletal:  4/5 BUE stress. Father 5 bilateral lower extremity strength.  Psychiatric: Normal mood. Normal affect. Fair insight. Fair judgment.  Neurologic: Alert and oriented x3. Cranial nerves II through XII are grossly intact. No focal deficits. Gait not tested secondary to safety.  Labs on Admission:  Basic Metabolic Panel:  Recent Labs Lab 05/16/12 0417  NA 132*  K 4.8  CL 94*  CO2 29  GLUCOSE 99  BUN 16  CREATININE 0.92  CALCIUM 8.6   Liver Function Tests: No results found for this basename: AST, ALT, ALKPHOS, BILITOT, PROT, ALBUMIN,  in the last 168 hours No results found for this basename: LIPASE, AMYLASE,  in the last 168 hours No results found for this basename: AMMONIA,  in the last 168 hours CBC:  Recent Labs Lab 05/16/12 0417  WBC 8.0  HGB 11.7*  HCT 35.5*  MCV 82.4   PLT 309   Cardiac Enzymes:  Recent Labs Lab 05/16/12 0417  TROPONINI <0.30    BNP (last 3 results) No results found for this basename: PROBNP,  in the last 8760 hours CBG: No results found for this basename: GLUCAP,  in the last 168 hours  Radiological Exams on Admission: Ct Chest W Contrast  05/16/2012  *RADIOLOGY REPORT*  Clinical Data: Chest pain; abnormal chest radiograph.  Recent diagnosis of lymphoma.  40 lbs weight loss over three months, with night sweats, fever and cough.  CT CHEST WITH CONTRAST  Technique:  Multidetector CT imaging of the chest was performed following the standard protocol during bolus administration of intravenous contrast.  Contrast: 80mL OMNIPAQUE IOHEXOL 300 MG/ML  SOLN  Comparison: Chest radiograph performed earlier today at 04:35 a.m.  Findings: Multiple centrally cavitary lesions are identified within the left upper lobe, measuring up to 3.9 cm in size.  Patchy airspace opacification extends toward the hilum, with an additional small focal opacity seen along the left major fissure.  There appears to be a nearly entirely cavitary 8 mm focus near the left lung apex.  The appearance is rather atypical for malignancy, and raises suspicion for tuberculosis, particularly as this would match the patient's symptoms.  Minimal nodular opacity, measuring at 6 mm, at the right upper lobe (image 30 of 67) could also reflect a small focus of infection. Minimal peripheral nodules are seen within the right lung, nonspecific in appearance.  The right lung is otherwise clear.  No pleural effusion or pneumothorax is identified.  Scattered mediastinal and bilateral hilar lymphadenopathy is seen, with mildly enlarged right paratracheal and aortopulmonary window nodes measuring up to 1.3 cm in short axis, and bilateral hilar nodes measuring up to 1.5 cm in short axis.  The mediastinum is otherwise unremarkable in appearance.  No pericardial effusion is identified.  The great vessels  are grossly unremarkable.  A 1.5 cm hypodensity within the right thyroid lobe is nonspecific. The thyroid gland is otherwise unremarkable in appearance.  No axillary lymphadenopathy is seen.  The visualized portions of the liver and spleen are unremarkable in appearance.  The visualized portions of the adrenal glands and both kidneys are within normal limits.  No acute osseous abnormalities are identified.  IMPRESSION:  1.  Multiple centrally cavitary lesions within the left upper lobe, measuring up to 3.9 cm in size.  Additional patchy airspace opacity extends towards the left hilum, with a nearly entirely cavitary 8 mm focus near the left lung apex.  Minimal nodular opacity also noted at the right upper lobe.  This raises suspicion for tuberculosis, particularly given  the patient's symptoms; the appearance is atypical for malignancy. 2.  Minimal peripheral nodules within the right lung, nonspecific in appearance. 3.  Scattered mediastinal and bilateral hilar lymphadenopathy, with nodes measuring up to 1.5 cm in short axis.  This may reflect tuberculosis, or possibly the patient's underlying lymphoma. 4.  1.5 cm hypodensity within the right thyroid lobe.  Consider further evaluation with thyroid ultrasound.  If patient is clinically hyperthyroid, consider nuclear medicine thyroid uptake and scan.  These results were called by telephone on 05/16/2012 at 06:52 a.m. to Dr. Devoria Albe, who verbally acknowledged these results.   Original Report Authenticated By: Tonia Ghent, M.D.    Dg Chest Port 1 View  05/16/2012  *RADIOLOGY REPORT*  Clinical Data: Left-sided chest pain and pressure.  Recent diagnosis of lymphoma.  PORTABLE CHEST - 1 VIEW  Comparison: Chest radiograph performed 07/15/2005  Findings: The lungs are well-aerated.  Focal airspace opacification is noted at the left upper lung zone; its appearance is suspicious for a mass.  There is suggestion of associated cavitation, and it could reflect either  malignancy or less likely tuberculosis, depending on the patient's symptoms.  No pleural effusion or pneumothorax is seen.  The cardiomediastinal silhouette is within normal limits.  No acute osseous abnormalities are seen.  IMPRESSION: Focal airspace opacification at the left upper lung zone, suspicious for a mass.  Suggestion of associated cavitation; this could reflect either malignancy or less likely tuberculosis, depending on the patient's symptoms.   Original Report Authenticated By: Tonia Ghent, M.D.     EKG: Independently reviewed. Sinus tachycardia  Assessment/Plan Principal Problem:   Cavitary lesion of lung Active Problems:   Other malignant lymphomas, unspecified site, extranodal and solid organ sites   Fever   Cachexia   Diffuse large B cell lymphoma   Thrush, oral   Dysphagia, unspecified   Chest pain   Malnutrition   GERD (gastroesophageal reflux disease)   Thyroid nodule   #1 cavitary lung lesion Questionable etiology. May be likely a cavitary pneumonia as patient is in an immunocompromise state versus TB. Patient has been placed in the airborne isolation room. Quanterferon TB is pending. AFB is pending. Will get a sputum Gram stain and culture. Blood cultures x2 are pending. Will place empirically on IV vancomycin, IV Rocephin, IV clindamycin. We'll consult with infectious diseases for further evaluation and management.  #2 fever Likely secondary to large B cell lymphoma versus secondary to problem #1. Sputum Gram stain and cultures are pending. AFB is pending. Quantity on TB is pending. Blood cultures x2 are pending. Will place empirically on IV vancomycin, IV Rocephin, IV clindamycin. ID consultation is pending. Follow.  #3 malnutrition Likely secondary to problem #1. Continue boost. [facility] consult with nutrition.  #4 tobacco abuse Tobacco cessation. We'll place on a nicotine patch.  #5 dysphagia/pain control Likely secondary to large B cell lymphoma. Will get a  speech therapy evaluation. Will place on full liquid diet for now. Pain management.  #6 diffuse large B cell lymphoma Will check a 2-D echo to assess baseline heart function. Will place on antibiotics. Will consult with oncology for further evaluation and management. Spoke with Dr Darrold Span.  #7 oral thrush Place on Diflucan.  #8 gastroesophageal reflux disease PPI.  #9 right thyroid lobe lesion Will check a TSH, free T4, T3. Will check a thyroid ultrasound. Follow.  #10 chest pain Likely secondary to problem #1. EKG with no ST-T wave abnormalities. Will cycle cardiac enzymes. Check a 2-D echo. Follow. Pain  management.  #11 prophylaxis PPI for GI prophylaxis. Lovenox for DVT prophylaxis.  Code Status: Full Family Communication: Updated patient, sister and girlfriend at bedside. Disposition Plan: Admit to med surg  Time spent: 70 mins  [facility] Pager [phone]  If 7PM-7AM, please contact night-coverage [email or web address] Password [email or web address] 05/16/2012, 1:23 PM

## 2012-05-16 NOTE — Progress Notes (Signed)
CARE MANAGEMENT NOTE 05/16/2012  Patient:  Nicholas King, Nicholas King   Account Number:  [id number]  Date Initiated:  05/16/2012  Documentation initiated by:  Ezekiel Ina  Subjective/Objective Assessment:   Pt admitted with cco chest pain, fever, lymphoma, malnautrition, thrush     Action/Plan:   home   Anticipated DC Date:  05/19/2012   Anticipated DC Plan:  HOME W HOME HEALTH SERVICES      DC Planning Services  CM consult      Choice offered to / List presented to:             Status of service:  In process, will continue to follow Medicare Important Message given?  NA - LOS <3 / Initial given by admissions (If response is "NO", the following Medicare IM given date fields will be blank) Date Medicare IM given:   Date Additional Medicare IM given:    Discharge Disposition:    Per UR Regulation:  Reviewed for med. necessity/level of care/duration of stay  If discussed at Long Length of Stay Meetings, dates discussed:    Comments:  05/16/12 MMcGibboney, RN, BSN Chart reviewed. [facility] called for medication assistance//will also check MATCH program when pt is reaady for discharge. Pt may benefit with HHRN, HHCSW at discharge.

## 2012-05-16 NOTE — ED Notes (Signed)
Patient to CT at this time

## 2012-05-16 NOTE — ED Provider Notes (Signed)
History     CSN: [id number]  Arrival date & time 05/16/12  0351   First MD Initiated Contact with Patient 05/16/12 [phone]      Chief Complaint  Patient presents with  . Chest Pain    (Consider location/radiation/quality/duration/timing/severity/associated sxs/prior treatment) HPI  Patient reports Nicholas King was working at [employer] and started having a sore throat and trouble swallowing at least a month ago. Nicholas King was seen in the emergency department had a CT scan done. Nicholas King states Nicholas King was given prescriptions. Nicholas King however continued to have sore throat and states Nicholas King had another CT scan done in [place]. Nicholas King can receive more prescriptions. Nicholas King was referred to Dr. Jearld Fenton, ENT and had a laryngoscopy in the office one week ago with biopsy. Nicholas King states the biopsy showed Nicholas King had a lymphoma and Nicholas King was called 4 days ago and Nicholas King was referred to Dr. Gaylyn Rong, oncologist whom Nicholas King saw yesterday. Nicholas King reports is going to get a PET scan and that Nicholas King needed to gain weight. Nicholas King states Nicholas King has lost 40 pounds in the last 3-4 weeks and his weight is now only 94 pounds. Nicholas King states they want his weight over 100 so Nicholas King can start chemotherapy. Nicholas King relates Nicholas King was called after Nicholas King might home and was prescribed a prescription for Augmentin because Nicholas King had fever of 102 when seen in the office. Nicholas King relates Nicholas King is allergic to amoxicillin and Nicholas King did take one dose of the Augmentin last night.  Patient reports Nicholas King has been having night sweats for a few weeks. Nicholas King also describes cough with white and yellow sputum production. Nicholas King states Nicholas King did have some nausea earlier this morning. Nicholas King denies rhinorrhea, vomiting or diarrhea. Nicholas King states Nicholas King was awakened from sleep at 3 AM with a left-sided sharp chest pain that was fleeting Nicholas King then had a constant pressure chest pain that lasted about an hour and 45 minutes which Nicholas King was ages resolved now. Nicholas King denies radiation of the pain, Nicholas King states Nicholas King's never had it before.  Patient states Nicholas King's had a PPD in the past that was  negative.  PCP none Oncologist Dr. Gaylyn Rong  Past Medical History  Diagnosis Date  . GERD (gastroesophageal reflux disease)   . Cancer   . Lymphoma     Past Surgical History  Procedure Laterality Date  . No past surgeries    . Direct laryngoscopy Left 05/08/2012    Procedure: DIRECT LARYNGOSCOPY with biopsy left tonsil;  Surgeon: Suzanna Obey, MD;  Location: [facility] OR;  Service: ENT;  Laterality: Left;  . Right eyebrown bone      Family History  Problem Relation Age of Onset  . Cancer Father     lung  . Diabetes Father   . Cancer Maternal Aunt     gyn cancer, pancreas  . Cancer Paternal Aunt     pancreas cancer  . Cancer Paternal Uncle     unk    History  Substance Use Topics  . Smoking status: Current Every Day Smoker -- 1.50 packs/day for 25 years    Types: Cigarettes  . Smokeless tobacco: Not on file  . Alcohol Use: No   Used to drink a few beers every night has quit since Nicholas King was diagnosed with cancer this week Patient trying to quit smoking Nicholas King was smoking one pack per day Patient employed painting water towers   Review of Systems  All other systems reviewed and are negative.    Allergies  Penicillins; Amoxicillin; and Hydrocodone  Home  Medications   Current Outpatient Rx  Name  Route  Sig  Dispense  Refill  . amoxicillin-clavulanate (AUGMENTIN) 875-125 MG per tablet   Oral   Take 1 tablet by mouth 2 (two) times daily.   14 tablet   0   . diphenhydrAMINE (BENADRYL) 25 mg capsule   Oral   Take 1 capsule (25 mg total) by mouth every 6 (six) hours.   12 capsule   0   . lidocaine (XYLOCAINE) 2 % solution   Oral   Take 20 mLs by mouth as needed for pain.   100 mL   0   . oxyCODONE-acetaminophen (PERCOCET) 7.5-325 MG per tablet   Oral   Take 1 tablet by mouth every 6 (six) hours as needed for pain.   90 tablet   0   . lidocaine-prilocaine (EMLA) cream   Topical   Apply topically as needed.   30 g   2   . morphine (MS CONTIN) 15 MG 12 hr tablet    Oral   Take 1 tablet (15 mg total) by mouth 2 (two) times daily.   60 tablet   0   . ondansetron (ZOFRAN) 8 MG tablet   Oral   Take 1 tablet (8 mg total) by mouth every 12 (twelve) hours as needed. Take two times a day starting the day after chemo for 3 days. Then take two times a day as needed for nausea or vomiting.   30 tablet   1   . predniSONE (DELTASONE) 20 MG tablet   Oral   Take 3 tablets (60 mg total) by mouth daily. Take on days 1-5 of chemotherapy.   45 tablet   0   . prochlorperazine (COMPAZINE) 10 MG tablet   Oral   Take 1 tablet (10 mg total) by mouth every 6 (six) hours as needed (Nausea or vomiting).   30 tablet   3     BP 109/78  Pulse 119  Temp(Src) 102.7 F (39.3 C) (Oral)  Resp 18  Ht 5\' 7"  (1.702 m)  Wt 94 lb (42.638 kg)  BMI 14.72 kg/m2  SpO2 100%  Vital signs normal except tachycardia and fever   Physical Exam  Nursing note and vitals reviewed. Constitutional: Nicholas King is oriented to person, place, and time.  Non-toxic appearance. Nicholas King does not appear ill. No distress.  Cachectic  HENT:  Head: Normocephalic and atraumatic.  Right Ear: External ear normal.  Left Ear: External ear normal.  Nose: Nose normal. No mucosal edema or rhinorrhea.  Mouth/Throat: Mucous membranes are normal. No dental abscesses or edematous.  Very dry, and mucous membranes. There is white material seen in the posterior pharynx which patient states has been present for while  Eyes: Conjunctivae and EOM are normal. Pupils are equal, round, and reactive to light.  Neck: Normal range of motion and full passive range of motion without pain. Neck supple.  Cardiovascular: Normal rate, regular rhythm and normal heart sounds.  Exam reveals no gallop and no friction rub.   No murmur heard. Pulmonary/Chest: Effort normal and breath sounds normal. No respiratory distress. Nicholas King has no wheezes. Nicholas King has no rhonchi. Nicholas King has no rales. Nicholas King exhibits no tenderness and no crepitus.  Abdominal: Soft.  Normal appearance and bowel sounds are normal. Nicholas King exhibits no distension. There is no tenderness. There is no rebound and no guarding.  Musculoskeletal: Normal range of motion. Nicholas King exhibits no edema and no tenderness.  Moves all extremities well.   Neurological: Nicholas King  is alert and oriented to person, place, and time. Nicholas King has normal strength. No cranial nerve deficit.  Skin: Skin is warm, dry and intact. No rash noted. No erythema. No pallor.  Psychiatric: Nicholas King has a normal mood and affect. His speech is normal and behavior is normal. His mood appears not anxious.    ED Course  Procedures (including critical care time)  Medications  0.9 %  sodium chloride infusion (0 mLs Intravenous Stopped 05/16/12 0738)    Followed by  0.9 %  sodium chloride infusion (1,000 mLs Intravenous New Bag/Given 05/16/12 0739)  levofloxacin (LEVAQUIN) IVPB 500 mg (500 mg Intravenous New Bag/Given 05/16/12 0739)  morphine 4 MG/ML injection 4 mg (4 mg Intravenous Given 05/16/12 0512)  iohexol (OMNIPAQUE) 300 MG/ML solution 80 mL (80 mLs Intravenous Contrast Given 05/16/12 4098)     Pt states Nicholas King has had a negative PPD in the past, however Nicholas King was placed in respiratory isolation until Nicholas King gets her CT scan to clarify his abnormal CXR and possible TB.   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   07:17 Dr Kerry Hough, states to have hospitalist at [facility] admit so Nicholas King can be seen by oncology and ID.  07:36 Although I and [facility] had requested the hospitalist from [facility], the flow manager sent the call to the hospitalist at [facility], Dr Dhungel  07:46 Dr Thedore Mins, accepts in transfer to [facility] to airborn isolation, med-surg, team 8, to page flow manager at [phone] on arrival   Results for orders placed during the hospital encounter of 05/16/12  CULTURE, BLOOD (ROUTINE X 2)      Result Value Range   Specimen Description BLOOD RIGHT ANTECUBITAL     Special  Requests BOTTLES DRAWN AEROBIC AND ANAEROBIC [facility] EACH     Culture PENDING     Report Status PENDING    CULTURE, BLOOD (ROUTINE X 2)      Result Value Range   Specimen Description BLOOD RIGHT ANTECUBITAL     Special Requests BOTTLES DRAWN AEROBIC AND ANAEROBIC 8CC EACH     Culture PENDING     Report Status PENDING    CBC      Result Value Range   WBC 8.0  4.0 - 10.5 K/uL   RBC 4.31  4.22 - 5.81 MIL/uL   Hemoglobin 11.7 (*) 13.0 - 17.0 g/dL   HCT 78.2 (*) 95.6 - 21.3 %   MCV 82.4  78.0 - 100.0 fL   MCH 27.1  26.0 - 34.0 pg   MCHC 33.0  30.0 - 36.0 g/dL   RDW 08.6  57.8 - 46.9 %   Platelets 309  150 - 400 K/uL  BASIC METABOLIC PANEL      Result Value Range   Sodium 132 (*) 135 - 145 mEq/L   Potassium 4.8  3.5 - 5.1 mEq/L   Chloride 94 (*) 96 - 112 mEq/L   CO2 29  19 - 32 mEq/L   Glucose, Bld 99  70 - 99 mg/dL   BUN 16  6 - 23 mg/dL   Creatinine, Ser 6.29  0.50 - 1.35 mg/dL   Calcium 8.6  8.4 - 52.8 mg/dL   GFR calc non Af Amer >90  >90 mL/min   GFR calc Af Amer >90  >90 mL/min  TROPONIN I      Result Value Range   Troponin I <0.30  <0.30 ng/mL  URINALYSIS, ROUTINE W REFLEX MICROSCOPIC      Result Value Range   Color, Urine YELLOW  YELLOW   APPearance CLEAR  CLEAR   Specific Gravity, Urine 1.015  1.005 - 1.030   pH 7.0  5.0 - 8.0   Glucose, UA NEGATIVE  NEGATIVE mg/dL   Hgb urine dipstick NEGATIVE  NEGATIVE   Bilirubin Urine NEGATIVE  NEGATIVE   Ketones, ur NEGATIVE  NEGATIVE mg/dL   Protein, ur 30 (*) NEGATIVE mg/dL   Urobilinogen, UA 2.0 (*) 0.0 - 1.0 mg/dL   Nitrite NEGATIVE  NEGATIVE   Leukocytes, UA NEGATIVE  NEGATIVE  URINE MICROSCOPIC-ADD ON      Result Value Range   Squamous Epithelial / LPF RARE  RARE   WBC, UA 0-2  <3 WBC/hpf   Bacteria, UA MANY (*) RARE   Urine-Other MUCOUS PRESENT     Laboratory interpretation all normal except mild hyponatremia and low chloride consistent with dehydration   Dg Chest Port 1 View  05/16/2012  *RADIOLOGY REPORT*   Clinical Data: Left-sided chest pain and pressure.  Recent diagnosis of lymphoma.  PORTABLE CHEST - 1 VIEW  Comparison: Chest radiograph performed 07/15/2005  Findings: The lungs are well-aerated.  Focal airspace opacification is noted at the left upper lung zone; its appearance is suspicious for a mass.  There is suggestion of associated cavitation, and it could reflect either malignancy or less likely tuberculosis, depending on the patient's symptoms.  No pleural effusion or pneumothorax is seen.  The cardiomediastinal silhouette is within normal limits.  No acute osseous abnormalities are seen.  IMPRESSION: Focal airspace opacification at the left upper lung zone, suspicious for a mass.  Suggestion of associated cavitation; this could reflect either malignancy or less likely tuberculosis, depending on the patient's symptoms.   Original Report Authenticated By: Tonia Ghent, M.D.     Ct Chest W Contrast  05/16/2012  *RADIOLOGY REPORT*  Clinical Data: Chest pain; abnormal chest radiograph.  Recent diagnosis of lymphoma.  40 lbs weight loss over three months, with night sweats, fever and cough.  CT CHEST WITH CONTRAST  Technique:  Multidetector CT imaging of the chest was performed following the standard protocol during bolus administration of intravenous contrast.  Contrast: 80mL OMNIPAQUE IOHEXOL 300 MG/ML  SOLN  Comparison: Chest radiograph performed earlier today at 04:35 a.m.  Findings: Multiple centrally cavitary lesions are identified within the left upper lobe, measuring up to 3.9 cm in size.  Patchy airspace opacification extends toward the hilum, with an additional small focal opacity seen along the left major fissure.  There appears to be a nearly entirely cavitary 8 mm focus near the left lung apex.  The appearance is rather atypical for malignancy, and raises suspicion for tuberculosis, particularly as this would match the patient's symptoms.  Minimal nodular opacity, measuring at 6 mm, at the  right upper lobe (image 30 of 67) could also reflect a small focus of infection. Minimal peripheral nodules are seen within the right lung, nonspecific in appearance.  The right lung is otherwise clear.  No pleural effusion or pneumothorax is identified.  Scattered mediastinal and bilateral hilar lymphadenopathy is seen, with mildly enlarged right paratracheal and aortopulmonary window nodes measuring up to 1.3 cm in short axis, and bilateral hilar nodes measuring up to 1.5 cm in short axis.  The mediastinum is otherwise unremarkable in appearance.  No pericardial effusion is identified.  The great vessels are grossly unremarkable.  A 1.5 cm hypodensity within the right thyroid lobe  is nonspecific. The thyroid gland is otherwise unremarkable in appearance.  No axillary lymphadenopathy is seen.  The visualized portions of the liver and spleen are unremarkable in appearance.  The visualized portions of the adrenal glands and both kidneys are within normal limits.  No acute osseous abnormalities are identified.  IMPRESSION:  1.  Multiple centrally cavitary lesions within the left upper lobe, measuring up to 3.9 cm in size.  Additional patchy airspace opacity extends towards the left hilum, with a nearly entirely cavitary 8 mm focus near the left lung apex.  Minimal nodular opacity also noted at the right upper lobe.  This raises suspicion for tuberculosis, particularly given the patient's symptoms; the appearance is atypical for malignancy. 2.  Minimal peripheral nodules within the right lung, nonspecific in appearance. 3.  Scattered mediastinal and bilateral hilar lymphadenopathy, with nodes measuring up to 1.5 cm in short axis.  This may reflect tuberculosis, or possibly the patient's underlying lymphoma. 4.  1.5 cm hypodensity within the right thyroid lobe.  Consider further evaluation with thyroid ultrasound.  If patient is clinically hyperthyroid, consider nuclear medicine thyroid uptake and scan.  These results  were called by telephone on 05/16/2012 at 06:52 a.m. to Dr. Devoria Albe, who verbally acknowledged these results.   Original Report Authenticated By: Tonia Ghent, M.D.      Ct Soft Tissue Neck W Contrast  04/17/2012    IMPRESSION: Extensive soft tissue swelling of the left tonsil region without abscess.  Shotty lymph nodes in the neck bilaterally.  13 mm irregular nodule right thyroid.  Recommend thyroid ultrasound for further evaluation.   Original Report Authenticated By: Janeece Riggers, M.D.      Date: 05/16/2012  Rate: 114  Rhythm: sinus tachycardia  QRS Axis: normal  Intervals: normal  ST/T Wave abnormalities: normal  Conduction Disutrbances:LVH  Narrative Interpretation:   Old EKG Reviewed: none available    1. Fever   2. Cavitary lesion of lung   3. Diffuse large B cell lymphoma   4. Cachexia     Plan admission   Devoria Albe, MD, FACEP   MDM          Ward Givens, MD 05/16/12 [phone]

## 2012-05-16 NOTE — ED Notes (Signed)
Pt states he awoke suddenly with left side chest pressure,  States he saw his cancer doctor yesterday and has been running high fever.

## 2012-05-16 NOTE — Progress Notes (Signed)
Went by patient's room to complete consult. Saw precaution sign and spoke to charge nurse. Because of the potential risk for TB we decided I would not visit at this time--at least until that is ruled out. Will continue to follow.

## 2012-05-16 NOTE — Consult Note (Signed)
[facility]    Date of Admission:  05/16/2012   Total days of antibiotics 2        Day 1 vancomycin        Day 1 ceftriaxone        Day 1 clindamycin       Reason for Consult: Cavitary pneumonia in the setting of recently diagnosed B-cell lymphoma    Referring Physician: Dr. Ramiro Harvest  Principal Problem:   Cavitary lesion of lung Active Problems:   Fever   Diffuse large B cell lymphoma   Other malignant lymphomas, unspecified site, extranodal and solid organ sites   Cachexia   Thrush, oral   Dysphagia, unspecified   Chest pain   Malnutrition   GERD (gastroesophageal reflux disease)   Thyroid nodule   Anemia   Hyperglycemia   Cigarette smoker   . cefTRIAXone (ROCEPHIN)  IV  1 g Intravenous Q24H  . clindamycin (CLEOCIN) IV  900 mg Intravenous Q8H  . docusate sodium  100 mg Oral BID  . enoxaparin (LOVENOX) injection  40 mg Subcutaneous Q24H  . [START ON 05/17/2012] fluconazole  100 mg Oral Daily  . lactose free nutrition  237 mL Oral QID  . nicotine  21 mg Transdermal Daily  . pantoprazole  40 mg Oral Daily  . vancomycin  500 mg Intravenous Q12H    Recommendations: 1. Continue current antibiotics 2. Sputum for Gram stain, routine culture, AFB stain and AFB culture 3. Agree with airborne isolation 4. Blood cultures 5. Check HIV antibody   Assessment: His presentation and CT findings suggest acute bacterial pneumonia. I think tuberculosis is less likely would agree with isolation for now. He does have some hilar adenopathy on the left side and may have some degree of obstruction causing postobstructive pneumonia. He feels like he can produce some sputum so we need to collect as soon as possible and obtain blood cultures. I will follow with you this weekend.    HPI: Nicholas King is a 42 y.o. male who has been feeling progressively worse for the past 3 months. He began to notice some scratchy throat and dry cough that has  slowly progressed. Over the last month he has had increasing problems with throat pain has made it difficult to eat and drink. He has lost about 40 pounds unintentionally. He was seen in the ED here are and found to have a pharyngeal mass. 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 For the past 3 days he has developed fever, chills, sweats and increased cough. Early this morning he woke with severe left chest pain that was somewhat pleuritic in nature. He presented back to the hospital where a chest CT showed a large cavitary mass in the left upper lobe. There is no air-fluid level in the cavity. He does have some hilar adenopathy but no clear evidence of bronchial obstruction and volume loss suggesting postobstructive pneumonia. He has noted very bad breath recently. He says he has some bad teeth but no current dental pain. He does not recall any episodes of choking.  He has no known exposure to anyone with tuberculosis. He had a negative PPD skin test in 2008 when he was incarcerated. Has a remote history of pneumonia. He is long-time cigarette smoker and also smokes marijuana. He has no history of MRSA infections. He says that he was in good health until he started to have 6 several  months ago. He works as a Therapist, nutritional.  He was started on Augmentin because of his fevers yesterday when he saw Dr. Gaylyn Rong for his first oncology appointment. He developed a pruritic red rash on his legs after the first dose.   Review of Systems: Pertinent items are noted in HPI.  Past Medical History  Diagnosis Date  . GERD (gastroesophageal reflux disease)   . Cancer   . Lymphoma   . Dysphagia, unspecified 05/16/2012    History  Substance Use Topics  . Smoking status: Current Every Day Smoker -- 1.50 packs/day for 25 years    Types: Cigarettes  . Smokeless tobacco: Not on file  . Alcohol Use: No    Family History  Problem Relation Age of Onset  . Cancer Father      lung  . Diabetes Father   . Cancer Maternal Aunt     gyn cancer, pancreas  . Cancer Paternal Aunt     pancreas cancer  . Cancer Paternal Uncle     unk   Allergies  Allergen Reactions  . Penicillins   . Amoxicillin Rash  . Hydrocodone Rash    OBJECTIVE: Blood pressure 103/53, pulse 90, temperature 100.4 F (38 C), temperature source Oral, resp. rate 18, height 5\' 7"  (1.702 m), weight 45.133 kg (99 lb 8 oz), SpO2 100.00%. General: He is alert and comfortable. He is cachectic.  Skin: Has fading erythematous rash over his knee caps. He is tanned.  Lymph nodes: 1 small nontender left axillary node Oral: He has a coated tongue.  Lungs: Clear  Cor: Regular S1 and S2 no murmurs  Abdomen: Soft and nontender  Microbiology: Recent Results (from the past 240 hour(s))  CULTURE, BLOOD (ROUTINE X 2)     Status: None   Collection Time    05/16/12  5:02 AM      Result Value Range Status   Specimen Description BLOOD RIGHT ANTECUBITAL   Final   Special Requests BOTTLES DRAWN AEROBIC AND ANAEROBIC 6CC EACH   Final   Culture NO GROWTH <24 HRS   Final   Report Status PENDING   Incomplete  CULTURE, BLOOD (ROUTINE X 2)     Status: None   Collection Time    05/16/12  5:02 AM      Result Value Range Status   Specimen Description BLOOD RIGHT ANTECUBITAL   Final   Special Requests BOTTLES DRAWN AEROBIC AND ANAEROBIC 8CC EACH   Final   Culture NO GROWTH <24 HRS   Final   Report Status PENDING   Incomplete    Cliffton Asters, MD [facility] [phone] pager   [phone] cell 05/16/2012, 3:35 PM

## 2012-05-16 NOTE — ED Notes (Signed)
Patient was given a Sprite 

## 2012-05-16 NOTE — Progress Notes (Signed)
INITIAL NUTRITION ASSESSMENT  Pt meets criteria for severe MALNUTRITION in the context of chronic illness as evidenced by 26.7% weight loss in the past 6 weeks in addition to pt with visible severe muscle wasting and subcutaneous fat loss in temporal, patellar, anterior thigh, and posterior calf region.  DOCUMENTATION CODES Per approved criteria  -Severe malnutrition in the context of chronic illness -Underweight   INTERVENTION: - Continue Boost Plus QID - Multivitamin 1 tablet PO daily - Ensure pudding TID - Magic Cup BID - Encouraged high calorie/protein diet, pt states his goal is to weigh 100 pounds.  - Medications for painful swallowing per MD - pt states lidocaine only helps temporarily, recommend MD consider Carafate and Magic Mouthwash.  - Per pt request, provided pt with Bible and pamphlets from chaplain. Emotional and spiritual support provided.  - Recommend PT consult as pt reports lying in bed all day for the past 2 weeks.  - Will continue to monitor   NUTRITION DIAGNOSIS: Predicted suboptimal energy intake related to dysphagia, painful swallowing, lethargy as evidenced by pt report of how he was eating at home and report of nutritional barriers.   Goal: 1. Resolution of painful swallowing 2. Pt able to safely consume 100% of meals/supplements  Monitor:  Weights, labs, intake   Reason for Assessment: Nutrition risk   42 y.o. 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 Pt found to have large cavitary mass in left upper lobe on CT suspicious for tuberculosis and is on airborne isolation precautions.   Met with pt who reports 40 pound unintended weight loss in the past 6 weeks. Pt also c/o painful swallowing and dysphagia for the past 2 months. Pt with thrush per RN.  Pt reports at home he was mainly only consuming Gatorade and soups and only in the past few days started adding 3-4 Boost/day to his nutritional intake. Pt reports yesterday the only food he ate was a bacon burger and potato and had to chew it very thoroughly r/t his dysphagia. Pt has only had part of a peanut butter [employer] milkshake today. Pt reports just lying around in bed all day for the past 2 weeks and feeling very weak and just wanted to sleep. Performed nutrition focused physical exam.   Nutrition Focused Physical Exam:  Subcutaneous Fat:  Orbital Region: mild/moderate wasting Upper Arm Region: mild/moderate wasting Thoracic and Lumbar Region: NA  Muscle:  Temple Region: severe wasting Clavicle Bone Region: mild/moderate wasting Clavicle and Acromion Bone Region: mild/moderate wasting Scapular Bone Region: NA Dorsal Hand: mild/moderate wasting Patellar Region: severe wasting Anterior Thigh Region: severe wasting Posterior Calf Region: severe wasting  Edema: None observed   Height: Ht Readings from Last 1 Encounters:  05/16/12 5\' 7"  (1.702 m)    Weight: Wt Readings from Last 1 Encounters:  05/16/12 99 lb 8 oz (45.133 kg)    Ideal Body Weight: 148 lb  % Ideal Body Weight: 67  Wt Readings from Last 10 Encounters:  05/16/12 99 lb 8 oz (45.133 kg)  05/15/12 96 lb 1.6 oz (43.591 kg)  05/05/12 100 lb 8 oz (45.587 kg)  04/20/12 130 lb (58.968 kg)  04/17/12 135 lb (61.236 kg)    Usual Body Weight: 135 lb  % Usual Body Weight: 73  BMI:  Body mass index is 15.58  kg/(m^2).  Estimated Nutritional Needs: Kcal: 1800-2025 Protein: 90-100g Fluid: 1.8-2L/day  Skin: Intact  Diet Order: Full Liquid  EDUCATION NEEDS: -No education needs identified at this time  No intake or output data in the 24 hours ending 05/16/12 1709  Last BM: 4/10  Labs:   Recent Labs Lab 05/16/12 0417 05/16/12 1329 05/16/12 1330  NA 132*  --   --   K 4.8  --   --   CL 94*  --    --   CO2 29  --   --   BUN 16  --   --   CREATININE 0.92  --  0.72  CALCIUM 8.6  --   --   MG  --   --  1.8  PHOS  --  3.0  --   GLUCOSE 99  --   --     CBG (last 3)  No results found for this basename: GLUCAP,  in the last 72 hours  Scheduled Meds: . cefTRIAXone (ROCEPHIN)  IV  1 g Intravenous Q24H  . clindamycin (CLEOCIN) IV  900 mg Intravenous Q8H  . docusate sodium  100 mg Oral BID  . enoxaparin (LOVENOX) injection  40 mg Subcutaneous Q24H  . [START ON 05/17/2012] fluconazole  100 mg Oral Daily  . lactose free nutrition  237 mL Oral QID  . nicotine  21 mg Transdermal Daily  . pantoprazole  40 mg Oral Daily  . vancomycin  500 mg Intravenous Q12H    Continuous Infusions: . sodium chloride 1,000 mL (05/16/12 0739)    Past Medical History  Diagnosis Date  . GERD (gastroesophageal reflux disease)   . Cancer   . Lymphoma   . Dysphagia, unspecified 05/16/2012    Past Surgical History  Procedure Laterality Date  . No past surgeries    . Direct laryngoscopy Left 05/08/2012    Procedure: DIRECT LARYNGOSCOPY with biopsy left tonsil;  Surgeon: Suzanna Obey, MD;  Location: [facility] OR;  Service: ENT;  Laterality: Left;  . Right eyebrown bone       Levon Hedger MS, RD, LDN [phone] Pager [phone] After Hours Pager

## 2012-05-16 NOTE — ED Notes (Signed)
Patient c/o left sided chest pressure that woke him up from sleep.  Patient states he has a recent diagnosis of lymphoma; had appointment with cancer MD at [facility] yesterday.  Patient was given multiple prescriptions that he has not had filled yet.  Patient denies nausea or vomiting, but states has been having shortness of breath.  Patient states at times he feels like he is wheezing.  Patient states his lymphoma is in his throat.  Patient states he has lost 40 pounds in the past 3 months.  A&O; skin w/d.  Patient has emaciated appearance.  Respirations even and unlabored; able to speak in complete sentences without difficulty.

## 2012-05-16 NOTE — Progress Notes (Signed)
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 Gaylyn Rong, he presented to [facility] with low-grade fevers, during his workup a CT scan of the chest was done per radiologist highly suspicious for tuberculosis, patient being transferred here for further management, patient does appear cachectic weighs only 94 pounds, denies any previous was PPDs, sputum AFB and blood quanteferon ordered. Will need ID, pulmonary and Dr. Gaylyn Rong.

## 2012-05-16 NOTE — Consult Note (Signed)
[facility]  [phone]    ONCOLOGY  HOSPITAL CONSULTATION NOTE  Nicholas King                                MR#: [id number]  DOB: 1970-11-03                       CSN#: [id number]  Referring MD: Dr. Emmie Niemann Hospitalists  IM Teaching Service    MD Primary MD: Dr.  Jaquita Rector for Consult:Diffuse large B-cell lymphoma    NWG:Nicholas King is a 42 y.o. male just seen at the office of Dr. Lodema Pilot at the 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  He had Hoarse voice, dysphagia with solid (better with liquid),a 40 lb weight loss 3 weeks ( Boost x 1-2 cans/day) . Left neck node was noted. CT soft tissue the neck on 04/17/2012 showed extensive soft tissue swelling of the left tonsil without abscess. There were shotty lymph nodes in the neck bilaterally. Biopsy by Dr. Jearld Fenton on 05/08/2012 showed large B-cell lymphoma in the posterior pharynx, and in the right posterior pharyngeal wall. . He has fatigue and has not been able to  work for the past 2 months due to be odynophagia. He denies palpable lymph node swelling anywhere else besides left neck. He has had low-grade persistent fever and foul breath for which he was placed empirically on Augmentin for possible abscess formation. He had no nausea vomiting, headache, vision changes, confusion, shortness of breath, chest pain, abdominal pain, melena, hematochezia, skin rash, low back pain, dysuria, pyuria, neuropathy, focal motor weakness.  Dr. Gaylyn Rong recommended other workup in preparation for therapy, including PET scan to complete staging work up,Bone marrow biopsy to rule out lymphoma in bone marrow space, Referral to 2-D echocardiogram to assess baseline heart function, Referral to Interventional Radiology for Portacath placement,Referral to chemo class to learn  about practical tips while on chemo, which likely is to be Rituxan, Cytoxan, Adriamycin, Vincristine, Prednisone. The first 4 agents are given intravenously on day 1 here at the [facility]. Prednisone pill is taken at home, in the morning, on days 1-5 (ONLY) of each chemo cycle. Number of cycles of chemo depends on stage/prognosis. If stage I disease, then 3 cycles of chemo followed by radiation. If stage III or IV disease, then chemo alone for at least 3 cycles without radiation Intrathecal chemo methotrexate (in addition to R-CHOP) to decrease risk of lymphoma in the brain in the future was recommended as well. ECOG was 1-2.  He was transferred from [facility] with spiking fevers ranging from 101 to 103.4 F, chills, night sweats, mild shortness of breath  and 4 to 5 hour history of dull  left chest pain. He had yellow productive sputum as well. In addition, he reported bilateral knee rash after only one dose of Augmentin. Pain resolved after initiation of Morphine.  CT of the chest with contrast on 05/16/12 showed  multiple Shiley cavitary lesions within the left upper lobe measuring up to 3.9 cm in size. Patchy airspace opacity extending posterior left hilum with nearly entirely cavitary 8mm focus in the left lung apex. Peripheral nodules also noted within the right lung. Mediastinal and bilateral hilar lymphadenopathy with  nodes measuring up to 1.5 cm also noted. A 1.5 cm hypodensity within the right thyroid lobe was also noted. ID is to see the patient.  He denies any previous  PPDs,thus one was ordered.  sputum AFB and blood quanteferon ordered. He was placed  empirically on IV vancomycin, IV Rocephin, IV clindamycin and Diflucan . A 2-D echo was ordered to assess baseline heart function. He is on Lovenox for DVT prophylaxis.      PMH:  Past Medical History  Diagnosis Date  . GERD (gastroesophageal reflux disease)   . Cancer   . Lymphoma   . Dysphagia, unspecified 05/16/2012     Surgeries:  Past Surgical History  Procedure Laterality Date  . No past surgeries    . Direct laryngoscopy Left 05/08/2012    Procedure: DIRECT LARYNGOSCOPY with biopsy left tonsil;  Surgeon: Suzanna Obey, MD;  Location: [facility] OR;  Service: ENT;  Laterality: Left;  . Right eyebrown bone      Allergies:  Allergies  Allergen Reactions  . Penicillins   . Amoxicillin Rash  . Hydrocodone Rash    Medications:   Prior to Admission:  Prescriptions prior to admission  Medication Sig Dispense Refill  . amoxicillin-clavulanate (AUGMENTIN) 875-125 MG per tablet Take 1 tablet by mouth 2 (two) times daily.  14 tablet  0  . oxyCODONE-acetaminophen (PERCOCET) 7.5-325 MG per tablet Take 1 tablet by mouth every 6 (six) hours as needed for pain.  90 tablet  0  . lidocaine-prilocaine (EMLA) cream Apply topically as needed.  30 g  2  . morphine (MS CONTIN) 15 MG 12 hr tablet Take 1 tablet (15 mg total) by mouth 2 (two) times daily.  60 tablet  0  . ondansetron (ZOFRAN) 8 MG tablet Take 1 tablet (8 mg total) by mouth every 12 (twelve) hours as needed. Take two times a day starting the day after chemo for 3 days. Then take two times a day as needed for nausea or vomiting.  30 tablet  1  . predniSONE (DELTASONE) 20 MG tablet Take 3 tablets (60 mg total) by mouth daily. Take on days 1-5 of chemotherapy.  45 tablet  0  . prochlorperazine (COMPAZINE) 10 MG tablet Take 1 tablet (10 mg total) by mouth every 6 (six) hours as needed (Nausea or vomiting).  30 tablet  3    ZOX:[id number], acetaminophen, albuterol, alum & mag hydroxide-simeth, ipratropium, morphine injection, ondansetron (ZOFRAN) IV, ondansetron, oxyCODONE, oxyCODONE-acetaminophen, polyethylene glycol, prochlorperazine, sodium phosphate, sorbitol  ROS: See HPI for significant positives, rest of ROS is negative.   Family History:    Family History  Problem Relation Age of Onset  . Cancer Father     lung  . Diabetes Father   . Cancer  Maternal Aunt     gyn cancer, pancreas  . Cancer Paternal Aunt     pancreas cancer  . Cancer Paternal Uncle     unk      Social History:  reports that he has been smoking Cigarettes.  He has a 37.5 pack-year smoking history. He does not have any smokeless tobacco history on file. He reports that he uses illicit drugs (Marijuana). He reports that he does not drink alcohol.  Physical Exam    Filed Vitals:   05/16/12 1036  BP: 109/58  Pulse: 87  Temp: 99.3 F (37.4 C)  Resp: 18     Filed Weights   05/16/12 0406 05/16/12 1036  Weight: 94 lb (42.638 kg) 99 lb 8 oz (  45.133 kg)   General:  89 -year-old  in no acute distress A. and O. x3  well-developed,cachectic HEENT: Normocephalic, atraumatic, PERRLA. Sclerae anicteric. Oral cavity thickening of left tonsil with possible white material. There was foul odor.   without thrush or lesions. NECK:supple. no thyromegaly, no cervical or supraclavicular adenopathy  LUNGS: clear bilaterally . No wheezing, rhonchi or rales. No axillary masses. BREASTS: not examined. CARDIOVASCULAR: regular rate and rhythm,no murmur , rubs or gallops ABDOMEN: soft, nontender, bowel sounds x4. No HSM. No masses palpable.  GU/rectal: deferred. EXTREMITIES: no clubbing cyanosis or edema. No bruising or petechial rash MUSCULOSKELETAL: no spinal tenderness.  NEURO: Non Focal. No Horner's.   Labs:  CBC   Recent Labs Lab 05/16/12 0417  WBC 8.0  HGB 11.7*  HCT 35.5*  PLT 309  MCV 82.4  MCH 27.1  MCHC 33.0  RDW 15.0     CMP    Recent Labs Lab 05/16/12 0417  NA 132*  K 4.8  CL 94*  CO2 29  GLUCOSE 99  BUN 16  CREATININE 0.92  CALCIUM 8.6        Component Value Date/Time   BILITOT 0.3 05/05/2012 1203     Imaging Studies:  Ct Soft Tissue Neck W Contrast  04/17/2012  *RADIOLOGY REPORT*  Clinical Data: Sore throat.  Rule out tonsillar abscess  CT NECK WITH CONTRAST  Technique:  Multidetector CT imaging of the neck was performed with  intravenous contrast.  Contrast: 75mL OMNIPAQUE IOHEXOL 300 MG/ML  SOLN  Comparison: None.  Findings: There is asymmetric soft tissue swelling of the left tonsil.  No abscess is present.  Right tonsil is normal.  No pathologic adenopathy in the neck.  Scattered lymph nodes are present in the neck bilaterally without abscess formation or pathologic enlargement.  Parotid and submandibular glands are normal bilaterally.  Larynx is normal  13 mm irregular nodule right lower thyroid.  This could be benign and malignant.  Thyroid ultrasound is suggested for further evaluation.  Left thyroid is normal.  Lung apices are clear.  No acute bony abnormality.  IMPRESSION: Extensive soft tissue swelling of the left tonsil region without abscess.  Shotty lymph nodes in the neck bilaterally.  13 mm irregular nodule right thyroid.  Recommend thyroid ultrasound for further evaluation.   Original Report Authenticated By: Janeece Riggers, M.D.    Ct Chest W Contrast  05/16/2012  *RADIOLOGY REPORT*  Clinical Data: Chest pain; abnormal chest radiograph.  Recent diagnosis of lymphoma.  40 lbs weight loss over three months, with night sweats, fever and cough.  CT CHEST WITH CONTRAST  Technique:  Multidetector CT imaging of the chest was performed following the standard protocol during bolus administration of intravenous contrast.  Contrast: 80mL OMNIPAQUE IOHEXOL 300 MG/ML  SOLN  Comparison: Chest radiograph performed earlier today at 04:35 a.m.  Findings: Multiple centrally cavitary lesions are identified within the left upper lobe, measuring up to 3.9 cm in size.  Patchy airspace opacification extends toward the hilum, with an additional small focal opacity seen along the left major fissure.  There appears to be a nearly entirely cavitary 8 mm focus near the left lung apex.  The appearance is rather atypical for malignancy, and raises suspicion for tuberculosis, particularly as this would match the patient's symptoms.  Minimal nodular  opacity, measuring at 6 mm, at the right upper lobe (image 30 of 67) could also reflect a small focus of infection. Minimal peripheral nodules are seen within the right lung, nonspecific  in appearance.  The right lung is otherwise clear.  No pleural effusion or pneumothorax is identified.  Scattered mediastinal and bilateral hilar lymphadenopathy is seen, with mildly enlarged right paratracheal and aortopulmonary window nodes measuring up to 1.3 cm in short axis, and bilateral hilar nodes measuring up to 1.5 cm in short axis.  The mediastinum is otherwise unremarkable in appearance.  No pericardial effusion is identified.  The great vessels are grossly unremarkable.  A 1.5 cm hypodensity within the right thyroid lobe is nonspecific. The thyroid gland is otherwise unremarkable in appearance.  No axillary lymphadenopathy is seen.  The visualized portions of the liver and spleen are unremarkable in appearance.  The visualized portions of the adrenal glands and both kidneys are within normal limits.  No acute osseous abnormalities are identified.  IMPRESSION:  1.  Multiple centrally cavitary lesions within the left upper lobe, measuring up to 3.9 cm in size.  Additional patchy airspace opacity extends towards the left hilum, with a nearly entirely cavitary 8 mm focus near the left lung apex.  Minimal nodular opacity also noted at the right upper lobe.  This raises suspicion for tuberculosis, particularly given the patient's symptoms; the appearance is atypical for malignancy. 2.  Minimal peripheral nodules within the right lung, nonspecific in appearance. 3.  Scattered mediastinal and bilateral hilar lymphadenopathy, with nodes measuring up to 1.5 cm in short axis.  This may reflect tuberculosis, or possibly the patient's underlying lymphoma. 4.  1.5 cm hypodensity within the right thyroid lobe.  Consider further evaluation with thyroid ultrasound.  If patient is clinically hyperthyroid, consider nuclear medicine  thyroid uptake and scan.  These results were called by telephone on 05/16/2012 at 06:52 a.m. to Dr. Devoria Albe, who verbally acknowledged these results.   Original Report Authenticated By: Tonia Ghent, M.D.    Dg Chest Port 1 View  05/16/2012  *RADIOLOGY REPORT*  Clinical Data: Left-sided chest pain and pressure.  Recent diagnosis of lymphoma.  PORTABLE CHEST - 1 VIEW  Comparison: Chest radiograph performed 07/15/2005  Findings: The lungs are well-aerated.  Focal airspace opacification is noted at the left upper lung zone; its appearance is suspicious for a mass.  There is suggestion of associated cavitation, and it could reflect either malignancy or less likely tuberculosis, depending on the patient's symptoms.  No pleural effusion or pneumothorax is seen.  The cardiomediastinal silhouette is within normal limits.  No acute osseous abnormalities are seen.  IMPRESSION: Focal airspace opacification at the left upper lung zone, suspicious for a mass.  Suggestion of associated cavitation; this could reflect either malignancy or less likely tuberculosis, depending on the patient's symptoms.   Original Report Authenticated By: Tonia Ghent, M.D.       A/P: 42 y.o. male with a new diagnosis of Diffuse large B-cell lymphomaWorkup was initiated but his status was complicated by fevers, chills, and left sided pain. CT of the chest revealed cavitary lesions. Workup for TB is pending. We were informed of the patient's admission.  Dr. Darrold Span, for Dr. Gaylyn Rong who is out of the office today, is to see the patient following this consult with recommendations Addendum to this note to be written. Thank you for the referral.  Marcos Eke, PA-C 05/16/2012 1:40 PM  Appreciate notification of admission by hospitalist service. Discussed with Dr Janee Morn earlier today. ID recommendations and workup in process noted, including TB testing, HIV and routine cultures. Spiking fevers may be from the diffuse large B cell NHL, but  need to be  sure no other obvious infectious etiology prior to starting chemotherapy. He needs PET, which probably is best done outpatient for insurance reasons, and likely bone marrow exam. He will need to be started on allopurinol shortly prior to start of chemo. Echocardiogram can be done inpatient, and [facility] inpatient as long as infection workup negative.  Our service will follow with you.  Thank you L.Darrold Span, MD [phone]

## 2012-05-16 NOTE — ED Notes (Signed)
Patient wants to know if he was being transferred to another hospital and if he needed to be wearing a mask. Explained to him that as long as he is in the pressure room he did not need one. But if he is transferred that they will make sure he has one on.Explained that the nurse would be in to see him.

## 2012-05-16 NOTE — Progress Notes (Signed)
ANTIBIOTIC CONSULT NOTE - INITIAL  Pharmacy Consult for Vancomycin and Ceftriaxone Indication: fever in immunocompromised patient, r/o tuberculosis.  Allergies  Allergen Reactions  . Penicillins   . Amoxicillin Rash  . Hydrocodone Rash    Patient Measurements: Height: 5\' 7"  (170.2 cm) Weight: 99 lb 8 oz (45.133 kg) IBW/kg (Calculated) : 66.1   Vital Signs: Temp: 99.3 F (37.4 C) (04/11 1036) Temp src: Oral (04/11 0831) BP: 109/58 mmHg (04/11 1036) Pulse Rate: 87 (04/11 1036) Intake/Output from previous day:   Intake/Output from this shift:    Labs:  Recent Labs  05/16/12 0417  WBC 8.0  HGB 11.7*  PLT 309  CREATININE 0.92   Estimated Creatinine Clearance: 67.4 ml/min (by C-G formula based on Cr of 0.92). No results found for this basename: VANCOTROUGH, Leodis Binet, VANCORANDOM, GENTTROUGH, GENTPEAK, GENTRANDOM, TOBRATROUGH, TOBRAPEAK, TOBRARND, AMIKACINPEAK, AMIKACINTROU, AMIKACIN,  in the last 72 hours   Microbiology: Recent Results (from the past 720 hour(s))  SURGICAL PCR SCREEN     Status: None   Collection Time    05/05/12 11:54 AM      Result Value Range Status   MRSA, PCR NEGATIVE  NEGATIVE Final   Staphylococcus aureus NEGATIVE  NEGATIVE Final   Comment:            The Xpert SA Assay (FDA     approved for NASAL specimens     in patients over 61 years of age),     is one component of     a comprehensive surveillance     program.  Test performance has     been validated by [employer] for patients greater     than or equal to 37 year old.     It is not intended     to diagnose infection nor to     guide or monitor treatment.  CULTURE, BLOOD (ROUTINE X 2)     Status: None   Collection Time    05/16/12  5:02 AM      Result Value Range Status   Specimen Description BLOOD RIGHT ANTECUBITAL   Final   Special Requests BOTTLES DRAWN AEROBIC AND ANAEROBIC 6CC EACH   Final   Culture NO GROWTH <24 HRS   Final   Report Status PENDING   Incomplete   CULTURE, BLOOD (ROUTINE X 2)     Status: None   Collection Time    05/16/12  5:02 AM      Result Value Range Status   Specimen Description BLOOD RIGHT ANTECUBITAL   Final   Special Requests BOTTLES DRAWN AEROBIC AND ANAEROBIC 8CC EACH   Final   Culture NO GROWTH <24 HRS   Final   Report Status PENDING   Incomplete    Medical History: Past Medical History  Diagnosis Date  . GERD (gastroesophageal reflux disease)   . Cancer   . Lymphoma   . Dysphagia, unspecified 05/16/2012    Medications:  Prescriptions prior to admission  Medication Sig Dispense Refill  . amoxicillin-clavulanate (AUGMENTIN) 875-125 MG per tablet Take 1 tablet by mouth 2 (two) times daily.  14 tablet  0  . oxyCODONE-acetaminophen (PERCOCET) 7.5-325 MG per tablet Take 1 tablet by mouth every 6 (six) hours as needed for pain.  90 tablet  0  . lidocaine-prilocaine (EMLA) cream Apply topically as needed.  30 g  2  . morphine (MS CONTIN) 15 MG 12 hr tablet Take 1 tablet (15 mg total) by mouth 2 (two) times  daily.  60 tablet  0  . ondansetron (ZOFRAN) 8 MG tablet Take 1 tablet (8 mg total) by mouth every 12 (twelve) hours as needed. Take two times a day starting the day after chemo for 3 days. Then take two times a day as needed for nausea or vomiting.  30 tablet  1  . predniSONE (DELTASONE) 20 MG tablet Take 3 tablets (60 mg total) by mouth daily. Take on days 1-5 of chemotherapy.  45 tablet  0  . prochlorperazine (COMPAZINE) 10 MG tablet Take 1 tablet (10 mg total) by mouth every 6 (six) hours as needed (Nausea or vomiting).  30 tablet  3   Anti-infectives   Start     Dose/Rate Route Frequency Ordered Stop   05/17/12 1000  fluconazole (DIFLUCAN) 40 MG/ML suspension 100 mg     100 mg Oral Daily 05/16/12 1259     05/16/12 1400  clindamycin (CLEOCIN) IVPB 900 mg     900 mg 100 mL/hr over 30 Minutes Intravenous 3 times per day 05/16/12 1257     05/16/12 1300  fluconazole (DIFLUCAN) IVPB 200 mg     200 mg 100 mL/hr over  60 Minutes Intravenous  Once 05/16/12 1259     05/16/12 0700  levofloxacin (LEVAQUIN) IVPB 500 mg     500 mg 100 mL/hr over 60 Minutes Intravenous  Once 05/16/12 4098 05/16/12 1191     Assessment:  42 yo M recently diagnosed with B-cell lymphoma planning to see Dr. Gaylyn Rong and start R-CHOP soon, presented [facility]11 with low-grade fevers, and CT showed cavitary lung lesion suspicious for tuberculosis, patient transferred to [facility] for further management.  Received Levaquin x 1.  Starting fluconazole and clindamycin per MD.  Pharmacy asked to dose Rocephin and Vanc.  SCr is wnl, CrCl 100 ml/min/1.62m2.  Blood and sputum/AFB cultures in process.  Goal of Therapy:  Vancomycin trough level 15-20 mcg/ml  Plan:   Vancomycin 500mg  IV q12h.  Rocephin 1g IV q24h.  Measure Vanc trough at steady state.  Follow up renal fxn and culture results.  Consider changing Rocephin to Levaquin for better coverage of possible TB. Is an ID consult indicated?  Charolotte Eke, PharmD, pager [phone]. 05/16/2012,1:19 PM.

## 2012-05-17 DIAGNOSIS — R509 Fever, unspecified: Secondary | ICD-10-CM

## 2012-05-17 DIAGNOSIS — I369 Nonrheumatic tricuspid valve disorder, unspecified: Secondary | ICD-10-CM

## 2012-05-17 DIAGNOSIS — J159 Unspecified bacterial pneumonia: Secondary | ICD-10-CM

## 2012-05-17 DIAGNOSIS — J984 Other disorders of lung: Secondary | ICD-10-CM

## 2012-05-17 DIAGNOSIS — R21 Rash and other nonspecific skin eruption: Secondary | ICD-10-CM | POA: Diagnosis present

## 2012-05-17 DIAGNOSIS — R636 Underweight: Secondary | ICD-10-CM | POA: Diagnosis present

## 2012-05-17 DIAGNOSIS — C8589 Other specified types of non-Hodgkin lymphoma, extranodal and solid organ sites: Secondary | ICD-10-CM

## 2012-05-17 DIAGNOSIS — J189 Pneumonia, unspecified organism: Secondary | ICD-10-CM

## 2012-05-17 DIAGNOSIS — T50905A Adverse effect of unspecified drugs, medicaments and biological substances, initial encounter: Secondary | ICD-10-CM

## 2012-05-17 LAB — CBC
Hemoglobin: 10.3 g/dL — ABNORMAL LOW (ref 13.0–17.0)
MCH: 26.6 pg (ref 26.0–34.0)
Platelets: 260 10*3/uL (ref 150–400)
RBC: 3.87 MIL/uL — ABNORMAL LOW (ref 4.22–5.81)
WBC: 4.4 10*3/uL (ref 4.0–10.5)

## 2012-05-17 LAB — COMPREHENSIVE METABOLIC PANEL
AST: 19 U/L (ref 0–37)
BUN: 6 mg/dL (ref 6–23)
CO2: 26 mEq/L (ref 19–32)
Calcium: 7.7 mg/dL — ABNORMAL LOW (ref 8.4–10.5)
Chloride: 98 mEq/L (ref 96–112)
Creatinine, Ser: 0.68 mg/dL (ref 0.50–1.35)
GFR calc Af Amer: >90 mL/min (ref >90)
GFR calc non Af Amer: >90 mL/min (ref >90)
Glucose, Bld: 92 mg/dL (ref 70–99)
Total Bilirubin: 0.4 mg/dL (ref 0.3–1.2)

## 2012-05-17 LAB — HIV ANTIBODY (ROUTINE TESTING W REFLEX): HIV: NONREACTIVE

## 2012-05-17 LAB — GLUCOSE, CAPILLARY: Glucose-Capillary: 78 mg/dL (ref 70–99)

## 2012-05-17 MED ORDER — MORPHINE SULFATE 2 MG/ML IJ SOLN
2.0000 mg | INTRAMUSCULAR | Status: DC | PRN
  Administered 2012-05-17 – 2012-05-20 (×22): 2 mg via INTRAVENOUS
  Filled 2012-05-17 (×23): qty 1

## 2012-05-17 NOTE — Progress Notes (Signed)
  Echocardiogram 2D Echocardiogram has been performed.  Nathaneil Feagans 05/17/2012, 8:36 AM

## 2012-05-17 NOTE — Evaluation (Signed)
Clinical/Bedside Swallow Evaluation Patient Details  Name: Nicholas King MRN: [id number] Date of Birth: 14-Aug-1970  Today's Date: 05/17/2012 Time: 1340-1404 SLP Time Calculation (min): 24 min  Past Medical History:  Past Medical History  Diagnosis Date  . GERD (gastroesophageal reflux disease)   . Cancer   . Lymphoma   . Dysphagia, unspecified 05/16/2012   Past Surgical History:  Past Surgical History  Procedure Laterality Date  . No past surgeries    . Direct laryngoscopy Left 05/08/2012    Procedure: DIRECT LARYNGOSCOPY with biopsy left tonsil;  Surgeon: Suzanna Obey, MD;  Location: [facility] OR;  Service: ENT;  Laterality: Left;  . Right eyebrown bone     HPI:  Nicholas King is a 42 y.o. male who has been feeling progressively worse for the past 3 months. He began to notice some scratchy throat and dry cough that has slowly progressed. Over the last month he has had increasing problems with throat pain has made it difficult to eat and drink. He has lost about 40 pounds unintentionally. He was seen in the ED here are and found to have a pharyngeal mass. 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 For the past 3 days he has developed fever, chills, sweats and increased cough. Early this morning he woke with severe left chest pain that was somewhat pleuritic in nature. He presented back to the hospital where a chest CT showed a large cavitary mass in the left upper lobe. There is no air-fluid level in the cavity. He does have some hilar adenopathy but no clear evidence of bronchial obstruction and volume loss suggesting postobstructive pneumonia. He has noted very bad breath recently. He says he has some bad teeth but no current dental pain. He does not recall any episodes of choking. Today pt reports that his ability to swallow has improved since getting pain medication. He has requested a return to solid foods and MD has started a Dys 3 (mechanical soft) diet.    Assessment /  Plan / Recommendation Clinical Impression  Pt demonstrates adequate oral and oropharyngeal function with thin liquids and soft solids. He reports pain that previously limited his oral intake is now relieved with medication. Pt did not demonstrate any evidence of aspiration or difficutly managing soft solids (bowl of pinto beans, banana pudding, potato salad). Pt is making good choices regarding diet texture. SLP provided further education regarding preparing soft solids at home (extra gravy, sauce, softening foods with liquids, over cooking vegetables, using a blender to make smoothies for fruit and vegetables).   Noted that there are no plans for surgical intervention, but pt likely to have chemo and radiation. Discussed signs of aspiration with pt for furture reference, handouts provided. Confident that pt will have appropriate education regarding management of oral health from preradiation education (candida, mucositis, xerostomia, change in swallow function following radiation). Appropriate education at this stage complete, will sign off.     Aspiration Risk  Mild    Diet Recommendation Dysphagia 3 (Mechanical Soft);Nectar-thick liquid   Liquid Administration via: Cup;Straw Medication Administration: Whole meds with liquid Supervision: Patient able to self feed Compensations: Slow rate;Small sips/bites;Follow solids with liquid (extra gravy/sauce. moist solids) Postural Changes and/or Swallow Maneuvers: Seated upright 90 degrees    Other  Recommendations Recommended Consults: FEES (Pt may benefit from FEES prior to/following radiation ) Oral Care Recommendations: Oral care QID Other Recommendations: Have oral suction available   Follow Up Recommendations  Outpatient SLP (if dysphagia persists. )  Frequency and Duration        Pertinent Vitals/Pain NA    SLP Swallow Goals     Swallow Study Prior Functional Status       General HPI: Nicholas King is a 42 y.o. male who has  been feeling progressively worse for the past 3 months. He began to notice some scratchy throat and dry cough that has slowly progressed. Over the last month he has had increasing problems with throat pain has made it difficult to eat and drink. He has lost about 40 pounds unintentionally. He was seen in the ED here are and found to have a pharyngeal mass. 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 For the past 3 days he has developed fever, chills, sweats and increased cough. Early this morning he woke with severe left chest pain that was somewhat pleuritic in nature. He presented back to the hospital where a chest CT showed a large cavitary mass in the left upper lobe. There is no air-fluid level in the cavity. He does have some hilar adenopathy but no clear evidence of bronchial obstruction and volume loss suggesting postobstructive pneumonia. He has noted very bad breath recently. He says he has some bad teeth but no current dental pain. He does not recall any episodes of choking. Today pt reports that his ability to swallow has improved since getting pain medication. He has requested a return to solid foods and MD has started a Dys 3 (mechanical soft) diet.  Type of Study: Bedside swallow evaluation Diet Prior to this Study: Dysphagia 3 (soft);Thin liquids Temperature Spikes Noted: Yes Respiratory Status: Room air History of Recent Intubation: No Behavior/Cognition: Alert;Cooperative;Pleasant mood Oral Cavity - Dentition: Adequate natural dentition Self-Feeding Abilities: Able to feed self Patient Positioning: Upright in bed Baseline Vocal Quality: Clear Volitional Cough: Strong Volitional Swallow: Able to elicit    Oral/Motor/Sensory Function Overall Oral Motor/Sensory Function: Appears within functional limits for tasks assessed   Ice Chips     Thin Liquid Thin Liquid: Within functional limits Presentation: Cup;Straw;Self Fed    Nectar Thick Nectar Thick Liquid: Not tested    Honey Thick Honey Thick Liquid: Not tested   Puree Puree: Within functional limits   Solid   GO    Solid: Within functional limits      [facility], [place] CCC-SLP [phone]  Claudine Mouton 05/17/2012,2:22 PM

## 2012-05-17 NOTE — Progress Notes (Signed)
[facility] PROGRESS NOTE  AZAI GAFFIN ZOX:[id number] DOB: [birth year] DOA: 05/16/2012 PCP: No PCP Per Patient  Brief narrative: Nicholas King is an 42 y.o. male with a past medical history of large B cell lymphoma, diagnosed by biopsy of a pharyngeal mass on 05/08/2012. He presented to the hospital on 05/16/2012 with left-sided chest pain, fever, chills, and increased cough and was found to have a cavitary pneumonia. Both oncology and infectious disease consultantions were requested on admission.  Assessment/Plan: Principal Problem:   Cavitary lesion of lung with fever and chest pain -Admitted and placed on empiric antibiotics. -Followup sputum for Gram stain, culture, AFB stain and culture. Quantiferon TB test pending. -Continue airborne isolation. -Followup blood cultures and HIV antibody. -Followup two-dimensional echocardiogram to assess baseline heart function. Active Problems:   Other malignant lymphomas, unspecified site, extranodal and solid organ sites / Diffuse large B cell lymphoma -Dr. Clelia Croft following.  Staging workup is ongoing. -Treatment to be initiated once his fever workup is complete and active infection under control.   Cachexia with severe malnutrition malnutrition in the context of chronic illness/underweight -Seen and evaluated by dietitian on 05/16/2012. Continue interventions as recommended.   Thrush, oral -Continue Diflucan and Magic mouthwash.   Dysphagia, unspecified -Speech therapy evaluation pending.   GERD (gastroesophageal reflux disease) -Continue PPI therapy.   Thyroid nodule -T3 low, free T3, free T4 and TSH within normal limits. -Status post thyroid ultrasound which showed a solid/cystic mass. Will need a fine-needle aspiration.   Anemia -Likely anemia of chronic disease. Hemoglobin stable.   Cigarette smoker -Tobacco cessation counseling and nursing staff. Continue nicotine patch.   Adverse drug reaction/rash -Macular erythematous  rash, temporally related to treatment with Augmentin.  Code Status: Full. Family Communication: None at bedside. Disposition Plan: Home when stable.   Medical Consultants:  Dr. Cliffton Asters, ID  Dr. Eli Hose, Oncology.  Other Consultants:  Speech therapy  Occupational therapy  Anti-infectives:  Diflucan 05/16/2012--->  Vancomycin 05/16/2012--->  Rocephin 05/16/2012--->  Clindamycin 05/16/2012--->  Levaquin for/12/2012--->05/16/12   HPI/Subjective: Nicholas King is mainly complaining of dysphasia and odynophagia. No shortness of breath or cough. Continues to run low-grade fevers. No nausea or vomiting.  Objective: Filed Vitals:   05/16/12 1036 05/16/12 1442 05/16/12 1934 05/17/12 0613  BP: 109/58 103/53 104/61 95/53  Pulse: 87 90 86 81  Temp: 99.3 F (37.4 C) 100.4 F (38 C) 99.4 F (37.4 C) 98.9 F (37.2 C)  TempSrc:  Oral Oral Oral  Resp: 18 18 18 16   Height: 5\' 7"  (1.702 m)     Weight: 45.133 kg (99 lb 8 oz)   48.535 kg (107 lb)  SpO2: 96% 100% 100% 100%    Intake/Output Summary (Last 24 hours) at 05/17/12 0807 Last data filed at 05/16/12 1438  Gross per 24 hour  Intake    240 ml  Output      0 ml  Net    240 ml    Exam: Gen:  NAD, cachectic appearing Cardiovascular:  RRR, No M/R/G Respiratory:  Lungs CTAB Gastrointestinal:  Abdomen soft, NT/ND, + BS Extremities:  No C/E/C Skin: Diffuse macular erythematous rash   Data Reviewed: Basic Metabolic Panel:  Recent Labs Lab 05/16/12 0417 05/16/12 1329 05/16/12 1330 05/17/12 0410  NA 132*  --   --  132*  K 4.8  --   --  3.6  CL 94*  --   --  98  CO2 29  --   --  26  GLUCOSE 99  --   --  92  BUN 16  --   --  6  CREATININE 0.92  --  0.72 0.68  CALCIUM 8.6  --   --  7.7*  MG  --   --  1.8  --   PHOS  --  3.0  --   --    GFR Estimated Creatinine Clearance: 83.4 ml/min (by C-G formula based on Cr of 0.68). Liver Function Tests:  Recent Labs Lab 05/16/12 1329 05/17/12 0410  AST  16 19  ALT 14 13  ALKPHOS 45 44  BILITOT 0.3 0.4  PROT 5.5* 5.4*  ALBUMIN 2.0* 2.0*   Coagulation profile  Recent Labs Lab 05/16/12 1329  INR 1.38    CBC:  Recent Labs Lab 05/16/12 0417 05/16/12 1330 05/17/12 0410  WBC 8.0 7.6 4.4  HGB 11.7* 9.5* 10.3*  HCT 35.5* 29.5* 32.1*  MCV 82.4 83.3 82.9  PLT 309 300 260   Cardiac Enzymes:  Recent Labs Lab 05/16/12 0417 05/16/12 1330 05/16/12 1842  TROPONINI <0.30 <0.30 <0.30   Thyroid function studies  Recent Labs  05/16/12 0718 05/16/12 1329  TSH 3.454  --   T3FREE  --  2.4   Microbiology Recent Results (from the past 240 hour(s))  CULTURE, BLOOD (ROUTINE X 2)     Status: None   Collection Time    05/16/12  5:02 AM      Result Value Range Status   Specimen Description BLOOD RIGHT ANTECUBITAL   Final   Special Requests BOTTLES DRAWN AEROBIC AND ANAEROBIC 6CC EACH   Final   Culture NO GROWTH <24 HRS   Final   Report Status PENDING   Incomplete  CULTURE, BLOOD (ROUTINE X 2)     Status: None   Collection Time    05/16/12  5:02 AM      Result Value Range Status   Specimen Description BLOOD RIGHT ANTECUBITAL   Final   Special Requests BOTTLES DRAWN AEROBIC AND ANAEROBIC 8CC EACH   Final   Culture NO GROWTH <24 HRS   Final   Report Status PENDING   Incomplete  URINE CULTURE     Status: None   Collection Time    05/16/12  5:20 AM      Result Value Range Status   Specimen Description URINE, CLEAN CATCH   Final   Special Requests NONE   Final   Culture  Setup Time 05/16/2012 15:36   Final   Colony Count NO GROWTH   Final   Culture NO GROWTH   Final   Report Status 05/16/2012 FINAL   Final     Procedures and Diagnostic Studies: Ct Chest W Contrast  05/16/2012  *RADIOLOGY REPORT*  Clinical Data: Chest pain; abnormal chest radiograph.  Recent diagnosis of lymphoma.  40 lbs weight loss over three months, with night sweats, fever and cough.  CT CHEST WITH CONTRAST  Technique:  Multidetector CT imaging of the  chest was performed following the standard protocol during bolus administration of intravenous contrast.  Contrast: 80mL OMNIPAQUE IOHEXOL 300 MG/ML  SOLN  Comparison: Chest radiograph performed earlier today at 04:35 a.m.  Findings: Multiple centrally cavitary lesions are identified within the left upper lobe, measuring up to 3.9 cm in size.  Patchy airspace opacification extends toward the hilum, with an additional small focal opacity seen along the left major fissure.  There appears to be a nearly entirely cavitary 8 mm focus near the left lung apex.  The  appearance is rather atypical for malignancy, and raises suspicion for tuberculosis, particularly as this would match the patient's symptoms.  Minimal nodular opacity, measuring at 6 mm, at the right upper lobe (image 30 of 67) could also reflect a small focus of infection. Minimal peripheral nodules are seen within the right lung, nonspecific in appearance.  The right lung is otherwise clear.  No pleural effusion or pneumothorax is identified.  Scattered mediastinal and bilateral hilar lymphadenopathy is seen, with mildly enlarged right paratracheal and aortopulmonary window nodes measuring up to 1.3 cm in short axis, and bilateral hilar nodes measuring up to 1.5 cm in short axis.  The mediastinum is otherwise unremarkable in appearance.  No pericardial effusion is identified.  The great vessels are grossly unremarkable.  A 1.5 cm hypodensity within the right thyroid lobe is nonspecific. The thyroid gland is otherwise unremarkable in appearance.  No axillary lymphadenopathy is seen.  The visualized portions of the liver and spleen are unremarkable in appearance.  The visualized portions of the adrenal glands and both kidneys are within normal limits.  No acute osseous abnormalities are identified.  IMPRESSION:  1.  Multiple centrally cavitary lesions within the left upper lobe, measuring up to 3.9 cm in size.  Additional patchy airspace opacity extends towards  the left hilum, with a nearly entirely cavitary 8 mm focus near the left lung apex.  Minimal nodular opacity also noted at the right upper lobe.  This raises suspicion for tuberculosis, particularly given the patient's symptoms; the appearance is atypical for malignancy. 2.  Minimal peripheral nodules within the right lung, nonspecific in appearance. 3.  Scattered mediastinal and bilateral hilar lymphadenopathy, with nodes measuring up to 1.5 cm in short axis.  This may reflect tuberculosis, or possibly the patient's underlying lymphoma. 4.  1.5 cm hypodensity within the right thyroid lobe.  Consider further evaluation with thyroid ultrasound.  If patient is clinically hyperthyroid, consider nuclear medicine thyroid uptake and scan.  These results were called by telephone on 05/16/2012 at 06:52 a.m. to Dr. Devoria Albe, who verbally acknowledged these results.   Original Report Authenticated By: Tonia Ghent, M.D.    US Soft Tissue Head/neck  05/16/2012  *RADIOLOGY REPORT*  Clinical Data: Thyroid nodule identified on neck CT scan.  THYROID ULTRASOUND  Technique: Ultrasound examination of the thyroid gland and adjacent soft tissues was performed.  Comparison:  Neck CT scan 04/17/2012.  Findings:  Right thyroid lobe:  Measures 6.2 x 1.9 x 1.6 cm. Left thyroid lobe:  Measures 4.9 x 1.8 x 1.0 cm. Isthmus:  Measures 0.4 cm in thickness.  Focal nodules:  A cystic and solid nodule measuring 2.5 x 1.2 x 2.2 cm is seen in the lower pole of the right lobe of the thyroid.  No microcalcifications are identified in association with the lesion.  Lymphadenopathy:  Small lymph nodes are seen in both the right and left neck.  Large is in the left neck measuring 1.4 cm in diameter.  IMPRESSION: 2.5 cm solid and cystic nodule in the lower pole of the right lobe of the thyroid.  Recommend fine needle aspiration.  This recommendation follows the consensus statement:  Management of Thyroid Nodules Detected as [place]:  Society of  Radiologists in Ultrasound Consensus Conference Statement.  Radiology 2005; 237:794- 800.   Original Report Authenticated By: Holley Dexter, M.D.    Dg Chest Port 1 View  05/16/2012  *RADIOLOGY REPORT*  Clinical Data: Left-sided chest pain and pressure.  Recent diagnosis of lymphoma.  PORTABLE  CHEST - 1 VIEW  Comparison: Chest radiograph performed 07/15/2005  Findings: The lungs are well-aerated.  Focal airspace opacification is noted at the left upper lung zone; its appearance is suspicious for a mass.  There is suggestion of associated cavitation, and it could reflect either malignancy or less likely tuberculosis, depending on the patient's symptoms.  No pleural effusion or pneumothorax is seen.  The cardiomediastinal silhouette is within normal limits.  No acute osseous abnormalities are seen.  IMPRESSION: Focal airspace opacification at the left upper lung zone, suspicious for a mass.  Suggestion of associated cavitation; this could reflect either malignancy or less likely tuberculosis, depending on the patient's symptoms.   Original Report Authenticated By: Tonia Ghent, M.D.     Scheduled Meds: . cefTRIAXone (ROCEPHIN)  IV  1 g Intravenous Q24H  . clindamycin (CLEOCIN) IV  900 mg Intravenous Q8H  . docusate sodium  100 mg Oral BID  . feeding supplement  1 Container Oral TID BM  . fluconazole  100 mg Oral Daily  . lactose free nutrition  237 mL Oral QID  . multivitamin  5 mL Oral Daily  . nicotine  21 mg Transdermal Daily  . pantoprazole  40 mg Oral Daily  . vancomycin  500 mg Intravenous Q12H   Continuous Infusions: . sodium chloride 1,000 mL (05/17/12 0358)    Time spent: 40 minutes.   LOS: 1 day   RAMA,CHRISTINA  [facility] Pager [phone].  If 8PM-8AM, please contact night-coverage at [email or web address], password [facility] 05/17/2012, 8:07 AM

## 2012-05-17 NOTE — Progress Notes (Signed)
Patient ID: Nicholas King, male   DOB: July 25, 1970, 43 y.o.   MRN: [id number]         [facility]    Date of Admission:  05/16/2012   Total days of antibiotics 3        Day 2 vancomycin        Day 2 ceftriaxone        Day 2 clindamycin  Principal Problem:   Cavitary lesion of lung Active Problems:   Fever   Diffuse large B cell lymphoma   Other malignant lymphomas, unspecified site, extranodal and solid organ sites   Cachexia   Thrush, oral   Dysphagia, unspecified   Chest pain   Severe malnutrition in the context of chronic illness   GERD (gastroesophageal reflux disease)   Thyroid nodule   Anemia   Cigarette smoker   Underweight   Adverse drug reaction   Rash and nonspecific skin eruption   . cefTRIAXone (ROCEPHIN)  IV  1 g Intravenous Q24H  . clindamycin (CLEOCIN) IV  900 mg Intravenous Q8H  . docusate sodium  100 mg Oral BID  . feeding supplement  1 Container Oral TID BM  . fluconazole  100 mg Oral Daily  . lactose free nutrition  237 mL Oral QID  . multivitamin  5 mL Oral Daily  . nicotine  21 mg Transdermal Daily  . pantoprazole  40 mg Oral Daily  . vancomycin  500 mg Intravenous Q12H    Subjective: He says that he is feeling much better and has improved strength. He has not had any further chest pain. He is hungry and thinks that he may want to advance his diet by tomorrow morning. He is still bringing up some thick white sputum.  Objective: Temp:  [98.9 F (37.2 C)-100.4 F (38 C)] 98.9 F (37.2 C) (04/12 0613) Pulse Rate:  [81-90] 81 (04/12 0613) Resp:  [16-18] 16 (04/12 0613) BP: (95-104)/(53-61) 95/53 mmHg (04/12 0613) SpO2:  [100 %] 100 % (04/12 0613) Weight:  [48.535 kg (107 lb)] 48.535 kg (107 lb) (04/12 2130)  General: He looks better and more comfortable Skin: No acute rash Lungs: Clear Cor: Regular S1 and S2 no murmurs Abdomen: Nontender  Lab Results Lab Results  Component Value Date   WBC 4.4 05/17/2012   HGB 10.3* 05/17/2012   HCT 32.1* 05/17/2012   MCV 82.9 05/17/2012   PLT 260 05/17/2012    Lab Results  Component Value Date   CREATININE 0.68 05/17/2012   BUN 6 05/17/2012   NA 132* 05/17/2012   K 3.6 05/17/2012   CL 98 05/17/2012   CO2 26 05/17/2012    Lab Results  Component Value Date   ALT 13 05/17/2012   AST 19 05/17/2012   ALKPHOS 44 05/17/2012   BILITOT 0.4 05/17/2012      Microbiology: Recent Results (from the past 240 hour(s))  CULTURE, BLOOD (ROUTINE X 2)     Status: None   Collection Time    05/16/12  5:02 AM      Result Value Range Status   Specimen Description BLOOD RIGHT ANTECUBITAL   Final   Special Requests BOTTLES DRAWN AEROBIC AND ANAEROBIC 6CC EACH   Final   Culture NO GROWTH <24 HRS   Final   Report Status PENDING   Incomplete  CULTURE, BLOOD (ROUTINE X 2)     Status: None   Collection Time    05/16/12  5:02 AM  Result Value Range Status   Specimen Description BLOOD RIGHT ANTECUBITAL   Final   Special Requests BOTTLES DRAWN AEROBIC AND ANAEROBIC 8CC EACH   Final   Culture NO GROWTH <24 HRS   Final   Report Status PENDING   Incomplete  URINE CULTURE     Status: None   Collection Time    05/16/12  5:20 AM      Result Value Range Status   Specimen Description URINE, CLEAN CATCH   Final   Special Requests NONE   Final   Culture  Setup Time 05/16/2012 15:36   Final   Colony Count NO GROWTH   Final   Culture NO GROWTH   Final   Report Status 05/16/2012 FINAL   Final   Assessment: He is improving on broad empiric therapy for cavitary pneumonia. Blood cultures are negative so far and a sputum sample has just been sent to the lab this morning. I suspect that he has bacterial pneumonia as opposed to tuberculosis but will keep him in isolation pending final stains and cultures.  Plan: 1. Continue current empiric antibiotics 2. Await results of sputum and blood analyses  Cliffton Asters, MD [facility] [phone] pager   [phone] cell 05/17/2012, 11:08 AM

## 2012-05-17 NOTE — Evaluation (Signed)
Physical Therapy Evaluation Patient Details Name: Nicholas King MRN: [id number] DOB: 09-25-1970 Today's Date: 05/17/2012 Time: 9147-8295 PT Time Calculation (min): 24 min  PT Assessment / Plan / Recommendation Clinical Impression  pt presents at grossly at supervision to mod I level for functional mobility; Pt has been getting up in room with familiy present without difficulty; Encouraged pt to continue to mobilize  to maintain current status; discussed with RN as well    PT Assessment  Patent does not need any further PT services    Follow Up Recommendations  No PT follow up    Does the patient have the potential to tolerate intense rehabilitation      Barriers to Discharge        Equipment Recommendations       Recommendations for Other Services     Frequency      Precautions / Restrictions Precautions Precautions: None   Pertinent Vitals/Pain No c/o pain; pt had just had meds prior to PT     Mobility  Bed Mobility Bed Mobility: Supine to Sit Supine to Sit: 6: Modified independent (Device/Increase time) Transfers Transfers: Sit to Stand;Stand to Sit Sit to Stand: 6: Modified independent (Device/Increase time) Stand to Sit: 6: Modified independent (Device/Increase time) Ambulation/Gait Ambulation/Gait Assistance: 5: Supervision;6: Modified independent (Device/Increase time) Ambulation Distance (Feet): 400 Feet Assistive device: None Gait Pattern: Within Functional Limits    Exercises     PT Diagnosis:    PT Problem List:   PT Treatment Interventions:     PT Goals    Visit Information  Last PT Received On: 05/17/12 Assistance Needed: +1    Subjective Data  Subjective: I feel pretty good Patient Stated Goal: home   Prior Functioning  Home Living Available Help at Discharge: Family;Available 24 hours/day Type of Home: House Home Access: Stairs to enter [employer] of Steps: 4 Home Layout: One level Prior Function Level of  Independence: Independent Communication Communication: No difficulties    Cognition  Cognition Overall Cognitive Status: Appears within functional limits for tasks assessed/performed Arousal/Alertness: Awake/alert Orientation Level: Appears intact for tasks assessed Behavior During Session: [facility] for tasks performed    Extremity/Trunk Assessment Right Upper Extremity Assessment RUE ROM/Strength/Tone: [facility] for tasks assessed Left Upper Extremity Assessment LUE ROM/Strength/Tone: [facility] for tasks assessed Right Lower Extremity Assessment RLE ROM/Strength/Tone: [facility] for tasks assessed Left Lower Extremity Assessment LLE ROM/Strength/Tone: [facility] for tasks assessed   Balance    End of Session PT - End of Session Activity Tolerance: Patient tolerated treatment well Patient left: in chair;with call bell/phone within reach Nurse Communication: Mobility status  GP     [facility] 05/17/2012, 3:54 PM

## 2012-05-17 NOTE — Progress Notes (Signed)
IP PROGRESS NOTE  Subjective:   Patient feels Ok. Had night sweats and low grade temp (100.5).   Objective:  Vital signs in last 24 hours: Temp:  [98.9 F (37.2 C)-100.4 F (38 C)] 98.9 F (37.2 C) (04/12 7846) Pulse Rate:  [80-90] 81 (04/12 0613) Resp:  [16-20] 16 (04/12 0613) BP: (90-109)/(53-61) 95/53 mmHg (04/12 0613) SpO2:  [96 %-100 %] 100 % (04/12 0613) Weight:  [99 lb 8 oz (45.133 kg)-107 lb (48.535 kg)] 107 lb (48.535 kg) (04/12 9629) Weight change: 5 lb 8 oz (2.495 kg) Last BM Date: 05/15/12  Intake/Output from previous day: 04/11 0701 - 04/12 0700 In: 240 [P.O.:240] Out: -   Mouth: mucous membranes moist, pharynx normal without lesions Resp: clear to auscultation bilaterally Cardio: regular rate and rhythm, S1, S2 normal, no murmur, click, rub or gallop GI: soft, non-tender; bowel sounds normal; no masses,  no organomegaly Extremities: extremities normal, atraumatic, no cyanosis or edema   Lab Results:  Recent Labs  05/16/12 1330 05/17/12 0410  WBC 7.6 4.4  HGB 9.5* 10.3*  HCT 29.5* 32.1*  PLT 300 260    BMET  Recent Labs  05/16/12 0417 05/16/12 1330 05/17/12 0410  NA 132*  --  132*  K 4.8  --  3.6  CL 94*  --  98  CO2 29  --  26  GLUCOSE 99  --  92  BUN 16  --  6  CREATININE 0.92 0.72 0.68  CALCIUM 8.6  --  7.7*    Studies/Results: Ct Chest W Contrast  05/16/2012  *RADIOLOGY REPORT*  Clinical Data: Chest pain; abnormal chest radiograph.  Recent diagnosis of lymphoma.  40 lbs weight loss over three months, with night sweats, fever and cough.  CT CHEST WITH CONTRAST  Technique:  Multidetector CT imaging of the chest was performed following the standard protocol during bolus administration of intravenous contrast.  Contrast: 80mL OMNIPAQUE IOHEXOL 300 MG/ML  SOLN  Comparison: Chest radiograph performed earlier today at 04:35 a.m.  Findings: Multiple centrally cavitary lesions are identified within the left upper lobe, measuring up to 3.9 cm in  size.  Patchy airspace opacification extends toward the hilum, with an additional small focal opacity seen along the left major fissure.  There appears to be a nearly entirely cavitary 8 mm focus near the left lung apex.  The appearance is rather atypical for malignancy, and raises suspicion for tuberculosis, particularly as this would match the patient's symptoms.  Minimal nodular opacity, measuring at 6 mm, at the right upper lobe (image 30 of 67) could also reflect a small focus of infection. Minimal peripheral nodules are seen within the right lung, nonspecific in appearance.  The right lung is otherwise clear.  No pleural effusion or pneumothorax is identified.  Scattered mediastinal and bilateral hilar lymphadenopathy is seen, with mildly enlarged right paratracheal and aortopulmonary window nodes measuring up to 1.3 cm in short axis, and bilateral hilar nodes measuring up to 1.5 cm in short axis.  The mediastinum is otherwise unremarkable in appearance.  No pericardial effusion is identified.  The great vessels are grossly unremarkable.  A 1.5 cm hypodensity within the right thyroid lobe is nonspecific. The thyroid gland is otherwise unremarkable in appearance.  No axillary lymphadenopathy is seen.  The visualized portions of the liver and spleen are unremarkable in appearance.  The visualized portions of the adrenal glands and both kidneys are within normal limits.  No acute osseous abnormalities are identified.  IMPRESSION:  1.  Multiple centrally cavitary lesions within the left upper lobe, measuring up to 3.9 cm in size.  Additional patchy airspace opacity extends towards the left hilum, with a nearly entirely cavitary 8 mm focus near the left lung apex.  Minimal nodular opacity also noted at the right upper lobe.  This raises suspicion for tuberculosis, particularly given the patient's symptoms; the appearance is atypical for malignancy. 2.  Minimal peripheral nodules within the right lung, nonspecific  in appearance. 3.  Scattered mediastinal and bilateral hilar lymphadenopathy, with nodes measuring up to 1.5 cm in short axis.  This may reflect tuberculosis, or possibly the patient's underlying lymphoma. 4.  1.5 cm hypodensity within the right thyroid lobe.  Consider further evaluation with thyroid ultrasound.  If patient is clinically hyperthyroid, consider nuclear medicine thyroid uptake and scan.  These results were called by telephone on 05/16/2012 at 06:52 a.m. to Dr. Devoria Albe, who verbally acknowledged these results.   Original Report Authenticated By: Tonia Ghent, M.D.    US Soft Tissue Head/neck  05/16/2012  *RADIOLOGY REPORT*  Clinical Data: Thyroid nodule identified on neck CT scan.  THYROID ULTRASOUND  Technique: Ultrasound examination of the thyroid gland and adjacent soft tissues was performed.  Comparison:  Neck CT scan 04/17/2012.  Findings:  Right thyroid lobe:  Measures 6.2 x 1.9 x 1.6 cm. Left thyroid lobe:  Measures 4.9 x 1.8 x 1.0 cm. Isthmus:  Measures 0.4 cm in thickness.  Focal nodules:  A cystic and solid nodule measuring 2.5 x 1.2 x 2.2 cm is seen in the lower pole of the right lobe of the thyroid.  No microcalcifications are identified in association with the lesion.  Lymphadenopathy:  Small lymph nodes are seen in both the right and left neck.  Large is in the left neck measuring 1.4 cm in diameter.  IMPRESSION: 2.5 cm solid and cystic nodule in the lower pole of the right lobe of the thyroid.  Recommend fine needle aspiration.  This recommendation follows the consensus statement:  Management of Thyroid Nodules Detected as [place]:  Society of Radiologists in Ultrasound Consensus Conference Statement.  Radiology 2005; 237:794- 800.   Original Report Authenticated By: Holley Dexter, M.D.    Dg Chest Port 1 View  05/16/2012  *RADIOLOGY REPORT*  Clinical Data: Left-sided chest pain and pressure.  Recent diagnosis of lymphoma.  PORTABLE CHEST - 1 VIEW  Comparison: Chest radiograph  performed 07/15/2005  Findings: The lungs are well-aerated.  Focal airspace opacification is noted at the left upper lung zone; its appearance is suspicious for a mass.  There is suggestion of associated cavitation, and it could reflect either malignancy or less likely tuberculosis, depending on the patient's symptoms.  No pleural effusion or pneumothorax is seen.  The cardiomediastinal silhouette is within normal limits.  No acute osseous abnormalities are seen.  IMPRESSION: Focal airspace opacification at the left upper lung zone, suspicious for a mass.  Suggestion of associated cavitation; this could reflect either malignancy or less likely tuberculosis, depending on the patient's symptoms.   Original Report Authenticated By: Tonia Ghent, M.D.     Medications: I have reviewed the patient's current medications.  Assessment/Plan:  42 year old with:  1. Diffuse large B-cell lymphoma, recently diagnosed. His staging work up is ongoing. Treatment will start once his fever work up is complete and active infection has subsided.   2. CT findings suggest acute bacterial pneumonia: On Rocephin at this time. Still has low grade temp. ID work up is ongoing.  LOS: 1 day   [facility] 05/17/2012, 7:34 AM

## 2012-05-18 LAB — EXPECTORATED SPUTUM ASSESSMENT W GRAM STAIN, RFLX TO RESP C

## 2012-05-18 MED ORDER — DOCUSATE SODIUM 100 MG PO CAPS
100.0000 mg | ORAL_CAPSULE | Freq: Two times a day (BID) | ORAL | Status: DC | PRN
  Filled 2012-05-18: qty 1

## 2012-05-18 MED ORDER — SODIUM CHLORIDE 0.9 % IV SOLN
1000.0000 mL | INTRAVENOUS | Status: DC
  Administered 2012-05-18 – 2012-05-19 (×4): 1000 mL via INTRAVENOUS

## 2012-05-18 NOTE — Progress Notes (Signed)
Patient ID: Nicholas King, male   DOB: 1970/10/25, 42 y.o.   MRN: [id number]         [facility]    Date of Admission:  05/16/2012   Total days of antibiotics 4        Day 3 vancomycin        Day 3 ceftriaxone        Day  3 clindamycin  Principal Problem:   Cavitary lesion of lung Active Problems:   Fever   Diffuse large B cell lymphoma   Other malignant lymphomas, unspecified site, extranodal and solid organ sites   Cachexia   Thrush, oral   Dysphagia, unspecified   Chest pain   Severe malnutrition in the context of chronic illness   GERD (gastroesophageal reflux disease)   Thyroid nodule   Anemia   Cigarette smoker   Underweight   Adverse drug reaction   Rash and nonspecific skin eruption   . cefTRIAXone (ROCEPHIN)  IV  1 g Intravenous Q24H  . clindamycin (CLEOCIN) IV  900 mg Intravenous Q8H  . feeding supplement  1 Container Oral TID BM  . fluconazole  100 mg Oral Daily  . lactose free nutrition  237 mL Oral QID  . multivitamin  5 mL Oral Daily  . nicotine  21 mg Transdermal Daily  . pantoprazole  40 mg Oral Daily  . vancomycin  500 mg Intravenous Q12H    Subjective: He says that he is feeling markedly better. His cough is nearly resolved. He is still having pain with swallowing but is eating much better.  Objective: Temp:  [98 F (36.7 C)-98.9 F (37.2 C)] 98.9 F (37.2 C) (04/13 0620) Pulse Rate:  [77-93] 77 (04/13 0620) Resp:  [18] 18 (04/12 1402) BP: (107-110)/(59-66) 107/59 mmHg (04/13 0620) SpO2:  [100 %] 100 % (04/13 0620)  General: He has just finished a shower and he is up walking in his room. He appears much stronger Skin: No rash Lungs: Clear Cor: Distant but regular S1 and S2 no murmurs  Lab Results Lab Results  Component Value Date   WBC 4.4 05/17/2012   HGB 10.3* 05/17/2012   HCT 32.1* 05/17/2012   MCV 82.9 05/17/2012   PLT 260 05/17/2012    Lab Results  Component Value Date   CREATININE 0.68 05/17/2012   BUN 6 05/17/2012   NA 132* 05/17/2012   K 3.6 05/17/2012   CL 98 05/17/2012   CO2 26 05/17/2012    Lab Results  Component Value Date   ALT 13 05/17/2012   AST 19 05/17/2012   ALKPHOS 44 05/17/2012   BILITOT 0.4 05/17/2012    HIV antibody negative   Microbiology: Recent Results (from the past 240 hour(s))  CULTURE, BLOOD (ROUTINE X 2)     Status: None   Collection Time    05/16/12  5:02 AM      Result Value Range Status   Specimen Description BLOOD RIGHT ANTECUBITAL   Final   Special Requests BOTTLES DRAWN AEROBIC AND ANAEROBIC 6CC EACH   Final   Culture NO GROWTH 1 DAY   Final   Report Status PENDING   Incomplete  CULTURE, BLOOD (ROUTINE X 2)     Status: None   Collection Time    05/16/12  5:02 AM      Result Value Range Status   Specimen Description BLOOD RIGHT ANTECUBITAL   Final   Special Requests BOTTLES DRAWN AEROBIC AND ANAEROBIC 8CC EACH  Final   Culture NO GROWTH 1 DAY   Final   Report Status PENDING   Incomplete  URINE CULTURE     Status: None   Collection Time    05/16/12  5:20 AM      Result Value Range Status   Specimen Description URINE, CLEAN CATCH   Final   Special Requests NONE   Final   Culture  Setup Time 05/16/2012 15:36   Final   Colony Count NO GROWTH   Final   Culture NO GROWTH   Final   Report Status 05/16/2012 FINAL   Final  CULTURE, EXPECTORATED SPUTUM-ASSESSMENT     Status: None   Collection Time    05/18/12  6:35 AM      Result Value Range Status   Specimen Description SPUTUM   Final   Special Requests NONE   Final   Sputum evaluation     Final   Value: THIS SPECIMEN IS ACCEPTABLE. RESPIRATORY CULTURE REPORT TO FOLLOW.   Report Status 05/18/2012 FINAL   Final   Assessment: He has had marked improvement with empiric therapy targeting bacterial acquired pneumonia and lung abscess. This is at all likely to be tuberculosis since he would not have improved on these antibiotics. I will discontinue his isolation and track down the Gram stain results  of his sputum. We'll consider switching him to oral flora quinolone therapy tomorrow. He will need at least 2 weeks of therapy and close followup before starting chemotherapy.  Plan: 1. Continue current empiric antibiotics 2. Await results of sputum Gram stain and culture and final blood cultures 3. Discontinue airborne isolation  Cliffton Asters, MD [facility] [phone] pager   [phone] cell 05/18/2012, 12:00 PM

## 2012-05-18 NOTE — Progress Notes (Signed)
[facility] PROGRESS NOTE  ROONEY SWAILS ZOX:[id number] DOB: Nov 16, 1970 DOA: 05/16/2012 PCP: No PCP Per Patient  Brief narrative: GURNIE DURIS is an 42 y.o. male with a past medical history of large B cell lymphoma, diagnosed by biopsy of a pharyngeal mass on 05/08/2012. He presented to the hospital on 05/16/2012 with left-sided chest pain, fever, chills, and increased cough and was found to have a cavitary pneumonia. Both oncology and infectious disease consultantions were requested on admission.  Assessment/Plan: Principal Problem:   Cavitary lesion of lung with fever and chest pain -Admitted and placed on empiric antibiotics. Clinically improving on empiric therapy. -Followup sputum for Gram stain, culture, AFB stain and culture. Quantiferon TB test pending. -Continue airborne isolation. -Followup blood culture. -HIV antibody negative. -Followup two-dimensional echocardiogram to assess baseline heart function. Active Problems:   Other malignant lymphomas, unspecified site, extranodal and solid organ sites / Diffuse large B cell lymphoma -Dr. Clelia Croft following.  Staging workup is ongoing. -Treatment to be initiated once his fever workup is complete and active infection under control.   Cachexia with severe malnutrition malnutrition in the context of chronic illness/underweight -Seen and evaluated by dietitian on 05/16/2012. Continue interventions as recommended.   Thrush, oral -Continue Diflucan and Magic mouthwash.   Dysphagia, unspecified -Seen by speech therapy on 05/17/2012. Adequate oral and oral pharyngeal function with thin liquids and soft solids.   GERD (gastroesophageal reflux disease) -Continue PPI therapy.   Thyroid nodule -T3 low, free T3, free T4 and TSH within normal limits. -Status post thyroid ultrasound which showed a solid/cystic mass. Will need a fine-needle aspiration (requested per IR).   Anemia -Likely anemia of chronic disease. Hemoglobin stable.    Cigarette smoker -Tobacco cessation counseling and nursing staff. Continue nicotine patch.   Adverse drug reaction/rash -Macular erythematous rash, temporally related to treatment with Augmentin.  Code Status: Full. Family Communication: None at bedside. Disposition Plan: Home when stable.   Medical Consultants:  Dr. Cliffton Asters, ID  Dr. Eli Hose, Oncology.  Other Consultants:  Speech therapy  Occupational therapy  Physical therapy  Anti-infectives:  Diflucan 05/16/2012--->  Vancomycin 05/16/2012--->  Rocephin 05/16/2012--->  Clindamycin 05/16/2012--->  Levaquin for/12/2012--->05/16/12   HPI/Subjective: [name] continues to have throat pain.  Fever curve down.  Eating soft foods.  No nausea or vomiting. Bowels moved twice yesterday and once so far today.  Objective: Filed Vitals:   05/17/12 0613 05/17/12 1402 05/17/12 2100 05/18/12 0620  BP: 95/53 110/62 109/66 107/59  Pulse: 81 93 84 77  Temp: 98.9 F (37.2 C) 98 F (36.7 C) 98.7 F (37.1 C) 98.9 F (37.2 C)  TempSrc: Oral Oral Oral Oral  Resp: 16 18    Height:      Weight: 48.535 kg (107 lb)     SpO2: 100% 100% 100% 100%   No intake or output data in the 24 hours ending 05/18/12 0901  Exam: Gen:  NAD, cachectic appearing Cardiovascular:  RRR, No M/R/G Respiratory:  Lungs CTAB Gastrointestinal:  Abdomen soft, NT/ND, + BS Extremities:  No C/E/C Skin: Diffuse macular erythematous rash   Data Reviewed: Basic Metabolic Panel:  Recent Labs Lab 05/16/12 0417 05/16/12 1329 05/16/12 1330 05/17/12 0410  NA 132*  --   --  132*  K 4.8  --   --  3.6  CL 94*  --   --  98  CO2 29  --   --  26  GLUCOSE 99  --   --  92  BUN  16  --   --  6  CREATININE 0.92  --  0.72 0.68  CALCIUM 8.6  --   --  7.7*  MG  --   --  1.8  --   PHOS  --  3.0  --   --    GFR Estimated Creatinine Clearance: 83.4 ml/min (by C-G formula based on Cr of 0.68). Liver Function Tests:  Recent Labs Lab  05/16/12 1329 05/17/12 0410  AST 16 19  ALT 14 13  ALKPHOS 45 44  BILITOT 0.3 0.4  PROT 5.5* 5.4*  ALBUMIN 2.0* 2.0*   Coagulation profile  Recent Labs Lab 05/16/12 1329  INR 1.38    CBC:  Recent Labs Lab 05/16/12 0417 05/16/12 1330 05/17/12 0410  WBC 8.0 7.6 4.4  HGB 11.7* 9.5* 10.3*  HCT 35.5* 29.5* 32.1*  MCV 82.4 83.3 82.9  PLT 309 300 260   Cardiac Enzymes:  Recent Labs Lab 05/16/12 0417 05/16/12 1330 05/16/12 1842  TROPONINI <0.30 <0.30 <0.30   Thyroid function studies  Recent Labs  05/16/12 0718 05/16/12 1329  TSH 3.454  --   T3FREE  --  2.4   Microbiology Recent Results (from the past 240 hour(s))  CULTURE, BLOOD (ROUTINE X 2)     Status: None   Collection Time    05/16/12  5:02 AM      Result Value Range Status   Specimen Description BLOOD RIGHT ANTECUBITAL   Final   Special Requests BOTTLES DRAWN AEROBIC AND ANAEROBIC 6CC EACH   Final   Culture NO GROWTH 1 DAY   Final   Report Status PENDING   Incomplete  CULTURE, BLOOD (ROUTINE X 2)     Status: None   Collection Time    05/16/12  5:02 AM      Result Value Range Status   Specimen Description BLOOD RIGHT ANTECUBITAL   Final   Special Requests BOTTLES DRAWN AEROBIC AND ANAEROBIC 8CC EACH   Final   Culture NO GROWTH 1 DAY   Final   Report Status PENDING   Incomplete  URINE CULTURE     Status: None   Collection Time    05/16/12  5:20 AM      Result Value Range Status   Specimen Description URINE, CLEAN CATCH   Final   Special Requests NONE   Final   Culture  Setup Time 05/16/2012 15:36   Final   Colony Count NO GROWTH   Final   Culture NO GROWTH   Final   Report Status 05/16/2012 FINAL   Final  CULTURE, EXPECTORATED SPUTUM-ASSESSMENT     Status: None   Collection Time    05/18/12  6:35 AM      Result Value Range Status   Specimen Description SPUTUM   Final   Special Requests NONE   Final   Sputum evaluation     Final   Value: THIS SPECIMEN IS ACCEPTABLE. RESPIRATORY CULTURE  REPORT TO FOLLOW.   Report Status 05/18/2012 FINAL   Final     Procedures and Diagnostic Studies: Ct Chest W Contrast  05/16/2012  *RADIOLOGY REPORT*  Clinical Data: Chest pain; abnormal chest radiograph.  Recent diagnosis of lymphoma.  40 lbs weight loss over three months, with night sweats, fever and cough.  CT CHEST WITH CONTRAST  Technique:  Multidetector CT imaging of the chest was performed following the standard protocol during bolus administration of intravenous contrast.  Contrast: 80mL OMNIPAQUE IOHEXOL 300 MG/ML  SOLN  Comparison: Chest radiograph  performed earlier today at 04:35 a.m.  Findings: Multiple centrally cavitary lesions are identified within the left upper lobe, measuring up to 3.9 cm in size.  Patchy airspace opacification extends toward the hilum, with an additional small focal opacity seen along the left major fissure.  There appears to be a nearly entirely cavitary 8 mm focus near the left lung apex.  The appearance is rather atypical for malignancy, and raises suspicion for tuberculosis, particularly as this would match the patient's symptoms.  Minimal nodular opacity, measuring at 6 mm, at the right upper lobe (image 30 of 67) could also reflect a small focus of infection. Minimal peripheral nodules are seen within the right lung, nonspecific in appearance.  The right lung is otherwise clear.  No pleural effusion or pneumothorax is identified.  Scattered mediastinal and bilateral hilar lymphadenopathy is seen, with mildly enlarged right paratracheal and aortopulmonary window nodes measuring up to 1.3 cm in short axis, and bilateral hilar nodes measuring up to 1.5 cm in short axis.  The mediastinum is otherwise unremarkable in appearance.  No pericardial effusion is identified.  The great vessels are grossly unremarkable.  A 1.5 cm hypodensity within the right thyroid lobe is nonspecific. The thyroid gland is otherwise unremarkable in appearance.  No axillary lymphadenopathy is  seen.  The visualized portions of the liver and spleen are unremarkable in appearance.  The visualized portions of the adrenal glands and both kidneys are within normal limits.  No acute osseous abnormalities are identified.  IMPRESSION:  1.  Multiple centrally cavitary lesions within the left upper lobe, measuring up to 3.9 cm in size.  Additional patchy airspace opacity extends towards the left hilum, with a nearly entirely cavitary 8 mm focus near the left lung apex.  Minimal nodular opacity also noted at the right upper lobe.  This raises suspicion for tuberculosis, particularly given the patient's symptoms; the appearance is atypical for malignancy. 2.  Minimal peripheral nodules within the right lung, nonspecific in appearance. 3.  Scattered mediastinal and bilateral hilar lymphadenopathy, with nodes measuring up to 1.5 cm in short axis.  This may reflect tuberculosis, or possibly the patient's underlying lymphoma. 4.  1.5 cm hypodensity within the right thyroid lobe.  Consider further evaluation with thyroid ultrasound.  If patient is clinically hyperthyroid, consider nuclear medicine thyroid uptake and scan.  These results were called by telephone on 05/16/2012 at 06:52 a.m. to Dr. Devoria Albe, who verbally acknowledged these results.   Original Report Authenticated By: Tonia Ghent, M.D.    US Soft Tissue Head/neck  05/16/2012  *RADIOLOGY REPORT*  Clinical Data: Thyroid nodule identified on neck CT scan.  THYROID ULTRASOUND  Technique: Ultrasound examination of the thyroid gland and adjacent soft tissues was performed.  Comparison:  Neck CT scan 04/17/2012.  Findings:  Right thyroid lobe:  Measures 6.2 x 1.9 x 1.6 cm. Left thyroid lobe:  Measures 4.9 x 1.8 x 1.0 cm. Isthmus:  Measures 0.4 cm in thickness.  Focal nodules:  A cystic and solid nodule measuring 2.5 x 1.2 x 2.2 cm is seen in the lower pole of the right lobe of the thyroid.  No microcalcifications are identified in association with the lesion.   Lymphadenopathy:  Small lymph nodes are seen in both the right and left neck.  Large is in the left neck measuring 1.4 cm in diameter.  IMPRESSION: 2.5 cm solid and cystic nodule in the lower pole of the right lobe of the thyroid.  Recommend fine needle aspiration.  This recommendation follows the consensus statement:  Management of Thyroid Nodules Detected as [place]:  Society of Radiologists in Ultrasound Consensus Conference Statement.  Radiology 2005; 237:794- 800.   Original Report Authenticated By: Holley Dexter, M.D.    Dg Chest Port 1 View  05/16/2012  *RADIOLOGY REPORT*  Clinical Data: Left-sided chest pain and pressure.  Recent diagnosis of lymphoma.  PORTABLE CHEST - 1 VIEW  Comparison: Chest radiograph performed 07/15/2005  Findings: The lungs are well-aerated.  Focal airspace opacification is noted at the left upper lung zone; its appearance is suspicious for a mass.  There is suggestion of associated cavitation, and it could reflect either malignancy or less likely tuberculosis, depending on the patient's symptoms.  No pleural effusion or pneumothorax is seen.  The cardiomediastinal silhouette is within normal limits.  No acute osseous abnormalities are seen.  IMPRESSION: Focal airspace opacification at the left upper lung zone, suspicious for a mass.  Suggestion of associated cavitation; this could reflect either malignancy or less likely tuberculosis, depending on the patient's symptoms.   Original Report Authenticated By: Tonia Ghent, M.D.     Scheduled Meds: . cefTRIAXone (ROCEPHIN)  IV  1 g Intravenous Q24H  . clindamycin (CLEOCIN) IV  900 mg Intravenous Q8H  . docusate sodium  100 mg Oral BID  . feeding supplement  1 Container Oral TID BM  . fluconazole  100 mg Oral Daily  . lactose free nutrition  237 mL Oral QID  . multivitamin  5 mL Oral Daily  . nicotine  21 mg Transdermal Daily  . pantoprazole  40 mg Oral Daily  . vancomycin  500 mg Intravenous Q12H   Continuous  Infusions: . sodium chloride 1,000 mL (05/18/12 0548)    Time spent: 25 minutes.   LOS: 2 days   Cambria Osten  [facility] Pager [phone].  If 8PM-8AM, please contact night-coverage at [email or web address], password [facility] 05/18/2012, 9:01 AM

## 2012-05-18 NOTE — Progress Notes (Signed)
Clinical Social Work Department BRIEF PSYCHOSOCIAL ASSESSMENT 05/18/2012  Patient:  Nicholas King, Nicholas King     Account Number:  [id number]     Admit date:  05/16/2012  Clinical Social Worker:  Doroteo Glassman  Date/Time:  05/18/2012 09:50 AM  Referred by:  Physician  Date Referred:  05/18/2012 Referred for  Other - See comment   Other Referral:   Financial concerns   Interview type:  Patient Other interview type:    PSYCHOSOCIAL DATA Living Status:  FAMILY Admitted from facility:   Level of care:   Primary support name:  Elwyn Lade Primary support relationship to patient:  PARENT Degree of support available:   unknown    CURRENT CONCERNS Current Concerns  Financial Resources   Other Concerns:    SOCIAL WORK ASSESSMENT / PLAN Met with Pt to discuss financial concerns.    Pt stated that he doesn't have any insurance and that he also needs assistance with his medications.  He would like assistance in applying for [place].    CSW to notify RNCM to assist Pt with his medications and CSW to notify financial counselor to assist Pt with [employer] application.    No further CSW needs identified.    CSW thanked Pt for his time.    Notified RNCM, Olegario Messier.    LM for financial counselor.    CSW to sign off.   Assessment/plan status:  No Further Intervention Required Other assessment/ plan:   Information/referral to community resources:    PATIENT'S/FAMILY'S RESPONSE TO PLAN OF CARE: Pt thanked CSW for time and assistance.   Providence Crosby, LCSWA Clinical Social Work [phone]

## 2012-05-18 NOTE — Care Management Note (Addendum)
    Page 1 of 1   05/18/2012     5:28:38 PM   CARE MANAGEMENT NOTE 05/18/2012  Patient:  Nicholas King, Nicholas King   Account Number:  [id number]  Date Initiated:  05/16/2012  Documentation initiated by:  Ezekiel Ina  Subjective/Objective Assessment:   Pt admitted with cco chest pain, fever, lymphoma, malnautrition, thrush     Action/Plan:   home.   Anticipated DC Date:  05/19/2012   Anticipated DC Plan:  HOME/SELF CARE  In-house referral  Financial Counselor      DC Planning Services  CM consult      Choice offered to / List presented to:             Status of service:  In process, will continue to follow Medicare Important Message given?  NA - LOS <3 / Initial given by admissions (If response is "NO", the following Medicare IM given date fields will be blank) Date Medicare IM given:   Date Additional Medicare IM given:    Discharge Disposition:    Per UR Regulation:  Reviewed for med. necessity/level of care/duration of stay  If discussed at Long Length of Stay Meetings, dates discussed:    Comments:  05/19/12 Kailoni Vahle RN,BSN NCM [phone] NOTED PRIOR CM NOTE(SEE NOTE).IN ADDITION, PROVIDED PATIENT W/$4[facility] MED LIST,MED ASST WEBSITE,DEPT OF SOCIAL SERVICES TEL#,OTHER COMMUNITY RESOURCES,UNEMPLOYED,LEFT VM W/FINANCIAL COUNSELOR [id number] OF REFERRAL.EXPLAINED MATCH PROGRAM POLICY:1XUSE WITHIN 12MONTHS/$3 CO-PAY/MUST FILL SCRIPTS W/IN 7DAYS-PATIENT VOICED UNDERSTANDING.NO PT/OT F/U.  05/16/12 MMcGibboney, RN, BSN Chart reviewed. [facility] called for medication assistance//will also check MATCH program when pt is ready for discharge. Pt may benefit with HHRN, HHCSW at discharge.

## 2012-05-18 NOTE — Progress Notes (Signed)
IP PROGRESS NOTE  Subjective:   Patient feels a lot better. Afebrile and eating well.   Objective:  Vital signs in last 24 hours: Temp:  [98 F (36.7 C)-98.9 F (37.2 C)] 98.9 F (37.2 C) (04/13 0620) Pulse Rate:  [77-93] 77 (04/13 0620) Resp:  [18] 18 (04/12 1402) BP: (107-110)/(59-66) 107/59 mmHg (04/13 0620) SpO2:  [100 %] 100 % (04/13 0620) Weight change:  Last BM Date: 05/17/12  Intake/Output from previous day:    Mouth: mucous membranes moist, pharynx normal without lesions Resp: clear to auscultation bilaterally Cardio: regular rate and rhythm, S1, S2 normal, no murmur, click, rub or gallop GI: soft, non-tender; bowel sounds normal; no masses,  no organomegaly Extremities: extremities normal, atraumatic, no cyanosis or edema   Lab Results:  Recent Labs  05/16/12 1330 05/17/12 0410  WBC 7.6 4.4  HGB 9.5* 10.3*  HCT 29.5* 32.1*  PLT 300 260    BMET  Recent Labs  05/16/12 0417 05/16/12 1330 05/17/12 0410  NA 132*  --  132*  K 4.8  --  3.6  CL 94*  --  98  CO2 29  --  26  GLUCOSE 99  --  92  BUN 16  --  6  CREATININE 0.92 0.72 0.68  CALCIUM 8.6  --  7.7*    Studies/Results: US Soft Tissue Head/neck  05/16/2012  *RADIOLOGY REPORT*  Clinical Data: Thyroid nodule identified on neck CT scan.  THYROID ULTRASOUND  Technique: Ultrasound examination of the thyroid gland and adjacent soft tissues was performed.  Comparison:  Neck CT scan 04/17/2012.  Findings:  Right thyroid lobe:  Measures 6.2 x 1.9 x 1.6 cm. Left thyroid lobe:  Measures 4.9 x 1.8 x 1.0 cm. Isthmus:  Measures 0.4 cm in thickness.  Focal nodules:  A cystic and solid nodule measuring 2.5 x 1.2 x 2.2 cm is seen in the lower pole of the right lobe of the thyroid.  No microcalcifications are identified in association with the lesion.  Lymphadenopathy:  Small lymph nodes are seen in both the right and left neck.  Large is in the left neck measuring 1.4 cm in diameter.  IMPRESSION: 2.5 cm solid and  cystic nodule in the lower pole of the right lobe of the thyroid.  Recommend fine needle aspiration.  This recommendation follows the consensus statement:  Management of Thyroid Nodules Detected as [place]:  Society of Radiologists in Ultrasound Consensus Conference Statement.  Radiology 2005; 237:794- 800.   Original Report Authenticated By: Holley Dexter, M.D.     Medications: I have reviewed the patient's current medications.  Assessment/Plan:  42 year old with:  1. Diffuse large B-cell lymphoma, recently diagnosed. His staging work up is ongoing. Treatment will start once his fever work up is complete and active infection has subsided. That can be done as outpatient.   2. CT findings suggest acute bacterial pneumonia: On Rocephin at this time. TB work up ongoing but less likely. He is clinically improving.      LOS: 2 days   [facility] 05/18/2012, 8:58 AM

## 2012-05-18 NOTE — Evaluation (Signed)
Occupational Therapy Evaluation Patient Details Name: Nicholas King MRN: [id number] DOB: [birth year] Today's Date: 05/18/2012 Time: 2595-6387 OT Time Calculation (min): 14 min  OT Assessment / Plan / Recommendation Clinical Impression  This 42 year old mane was admitted with L side chest pain.  He has a h/o large B cell lymphoma and recently found to have a pharyngeal mass.  Will follow pt in acute to address safety with showering and dynamic balance when gathering clothes.  do not anticipate pt will need follow up OT after acute stay    OT Assessment  Patient needs continued OT Services    Follow Up Recommendations  No OT follow up    Barriers to Discharge      Equipment Recommendations   (recommend tub bench:  will further assess.  Pt does not haveinsurance) .     Recommendations for Other Services    Frequency  Min 2X/week    Precautions / Restrictions Precautions Precautions: Fall Restrictions Weight Bearing Restrictions: No   Pertinent Vitals/Pain No pain    ADL  Equipment Used:  (none) Transfers/Ambulation Related to ADLs: Pt mod I ambulating in room.  Attempted to step over garbage can twice to simulate tub.  Pt lost balance both times.  Educated on tub bench vs. seat with assist to step in, vs. sponge bathing ADL Comments: pt able to don clothing with set up    OT Diagnosis: Generalized weakness  OT Problem List: Impaired balance (sitting and/or standing) OT Treatment Interventions: Self-care/ADL training;Patient/family education;Balance training   OT Goals Acute Rehab OT Goals OT Goal Formulation: With patient Time For Goal Achievement: 06/01/12 Potential to Achieve Goals: Good ADL Goals Pt Will Perform Tub/Shower Transfer: with modified independence;Transfer tub bench (vs supervision stepping into tub for seat) ADL Goal: Tub/Shower Transfer - Progress: Goal set today Miscellaneous OT Goals Miscellaneous OT Goal #1: pt will gather clothes at supervision  level OT Goal: Miscellaneous Goal #1 - Progress: Goal set today  Visit Information  Last OT Received On: 05/18/12 Assistance Needed: +1    Subjective Data  Subjective: I feel like I'm getting back to baseline Patient Stated Goal: start chemo   Prior Functioning     Home Living Lives With:  (mother) Available Help at Discharge: Family;Available 24 hours/day Bathroom Shower/Tub: Engineer, manufacturing systems: Standard Prior Function Level of Independence: Independent Communication Communication: No difficulties         Vision/Perception     Cognition  Cognition Overall Cognitive Status: Appears within functional limits for tasks assessed/performed Behavior During Session: [facility] for tasks performed    Extremity/Trunk Assessment Right Upper Extremity Assessment RUE ROM/Strength/Tone: [facility] for tasks assessed Left Upper Extremity Assessment LUE ROM/Strength/Tone: WFL for tasks assessed     Mobility Bed Mobility Supine to Sit: 7: Independent;HOB flat Transfers Sit to Stand: 6: Modified independent (Device/Increase time)     Exercise     Balance     End of Session OT - End of Session Activity Tolerance: Patient tolerated treatment well Patient left: in bed;with call bell/phone within reach;with family/visitor present  GO     Merrit Friesen 05/18/2012, 11:25 AM Marica Otter, OTR/L [phone] 05/18/2012

## 2012-05-19 ENCOUNTER — Telehealth: Payer: Self-pay | Admitting: Oncology

## 2012-05-19 ENCOUNTER — Inpatient Hospital Stay (HOSPITAL_COMMUNITY): Payer: Medicaid Other

## 2012-05-19 ENCOUNTER — Telehealth: Payer: Self-pay | Admitting: Dietician

## 2012-05-19 LAB — CULTURE, RESPIRATORY W GRAM STAIN

## 2012-05-19 MED ORDER — VANCOMYCIN HCL 500 MG IV SOLR
500.0000 mg | Freq: Once | INTRAVENOUS | Status: AC
  Administered 2012-05-19: 500 mg via INTRAVENOUS
  Filled 2012-05-19: qty 500

## 2012-05-19 MED ORDER — VANCOMYCIN HCL IN DEXTROSE 750-5 MG/150ML-% IV SOLN
750.0000 mg | Freq: Three times a day (TID) | INTRAVENOUS | Status: DC
  Administered 2012-05-20: 750 mg via INTRAVENOUS
  Filled 2012-05-19 (×3): qty 150

## 2012-05-19 NOTE — Progress Notes (Signed)
Request for US guided FNA of recently found right thyroid nodule. Discussed procedure with pt in detail. Will proceed with biopsy today. Consent signed in chart  Brayton El PA-C 05/19/2012 12:19 PM

## 2012-05-19 NOTE — Progress Notes (Signed)
[facility] PROGRESS NOTE  Nicholas King WUJ:[id number] DOB: [birth year] DOA: 05/16/2012 PCP: No PCP Per Patient  Brief narrative: Nicholas King is an 42 y.o. male with a past medical history of large B cell lymphoma, diagnosed by biopsy of a pharyngeal mass on 05/08/2012. He presented to the hospital on 05/16/2012 with left-sided chest pain, fever, chills, and increased cough and was found to have a cavitary pneumonia. Both oncology and infectious disease consultantions were requested on admission. Patient has improved on empiric antibiotics.  Assessment/Plan: Principal Problem:   Staph Aureus pneumonia with cavitary lesion of lung, fever and chest pain -Admitted and placed on empiric antibiotics. Clinically improving on empiric therapy. -Followup final sputum culture (+S. Aureus---sensitivities pending), AFB culture. AFB smear negative. -D/C rocephin.  Continue Clindamycin and Vancomycin until ID makes further recommendations regarding choice of antibiotic, duration of therapy. -F/U Quantiferon TB test. -D/C airborne isolation. -Followup blood cultures (negative to date). -HIV antibody negative. -Two-dimensional echocardiogram: EF 50-55%, normal LV function. Active Problems:   Other malignant lymphomas, unspecified site, extranodal and solid organ sites / Diffuse large B cell lymphoma -Dr. Clelia Croft following.  Staging workup is ongoing. -Treatment to be initiated once his fever workup is complete and active infection under control.   Cachexia with severe malnutrition malnutrition in the context of chronic illness/underweight -Seen and evaluated by dietitian on 05/16/2012. Continue interventions as recommended.   Thrush, oral -Continue Diflucan and Magic mouthwash.   Dysphagia, unspecified -Seen by speech therapy on 05/17/2012. Adequate oral and oral pharyngeal function with thin liquids and soft solids.   GERD (gastroesophageal reflux disease) -Continue PPI therapy.   Thyroid  nodule -T3 low, free T3, free T4 and TSH within normal limits. -Status post thyroid ultrasound which showed a solid/cystic mass. Will need a fine-needle aspiration (requested per IR).   Anemia -Likely anemia of chronic disease. Hemoglobin stable.   Cigarette smoker -Tobacco cessation counseling and nursing staff. Continue nicotine patch.   Adverse drug reaction/rash -Macular erythematous rash, temporally related to treatment with Augmentin.  Code Status: Full. Family Communication: None at bedside. Disposition Plan: Home when stable.   Medical Consultants:  Dr. Cliffton Asters, ID  Dr. Eli Hose, Oncology.  Other Consultants:  Speech therapy  Occupational therapy  Physical therapy  Anti-infectives:  Diflucan 05/16/2012--->  Vancomycin 05/16/2012--->  Rocephin 05/16/2012--->  Clindamycin 05/16/2012--->  Levaquin for/12/2012--->05/16/12   HPI/Subjective: Nicholas King feels well enough to go home. No dyspnea. No nausea or vomiting. No diarrhea. Appetite improved. Still has some dysphasia, but can tolerate orals without difficulty.  Objective: Filed Vitals:   05/18/12 0620 05/18/12 1448 05/18/12 2139 05/19/12 0559  BP: 107/59 114/65 105/71 106/70  Pulse: 77 87 79 66  Temp: 98.9 F (37.2 C) 98.3 F (36.8 C) 98.3 F (36.8 C) 98.4 F (36.9 C)  TempSrc: Oral Oral Oral Oral  Resp:  16 18 16   Height:      Weight:    48.8 kg (107 lb 9.4 oz)  SpO2: 100% 99% 100% 100%    Intake/Output Summary (Last 24 hours) at 05/19/12 0738 Last data filed at 05/18/12 1900  Gross per 24 hour  Intake   1195 ml  Output      0 ml  Net   1195 ml    Exam: Gen:  NAD, cachectic appearing Cardiovascular:  RRR, No M/R/G Respiratory:  Lungs CTAB Gastrointestinal:  Abdomen soft, NT/ND, + BS Extremities:  No C/E/C Skin: Diffuse macular erythematous rash   Data Reviewed: Basic Metabolic Panel:  Recent Labs Lab 05/16/12 0417 05/16/12 1329 05/16/12 1330 05/17/12 0410  NA  132*  --   --  132*  K 4.8  --   --  3.6  CL 94*  --   --  98  CO2 29  --   --  26  GLUCOSE 99  --   --  92  BUN 16  --   --  6  CREATININE 0.92  --  0.72 0.68  CALCIUM 8.6  --   --  7.7*  MG  --   --  1.8  --   PHOS  --  3.0  --   --    GFR Estimated Creatinine Clearance: 83.9 ml/min (by C-G formula based on Cr of 0.68). Liver Function Tests:  Recent Labs Lab 05/16/12 1329 05/17/12 0410  AST 16 19  ALT 14 13  ALKPHOS 45 44  BILITOT 0.3 0.4  PROT 5.5* 5.4*  ALBUMIN 2.0* 2.0*   Coagulation profile  Recent Labs Lab 05/16/12 1329  INR 1.38    CBC:  Recent Labs Lab 05/16/12 0417 05/16/12 1330 05/17/12 0410  WBC 8.0 7.6 4.4  HGB 11.7* 9.5* 10.3*  HCT 35.5* 29.5* 32.1*  MCV 82.4 83.3 82.9  PLT 309 300 260   Cardiac Enzymes:  Recent Labs Lab 05/16/12 0417 05/16/12 1330 05/16/12 1842  TROPONINI <0.30 <0.30 <0.30   Thyroid function studies  Recent Labs  05/16/12 1329  T3FREE 2.4   Microbiology Recent Results (from the past 240 hour(s))  CULTURE, BLOOD (ROUTINE X 2)     Status: None   Collection Time    05/16/12  5:02 AM      Result Value Range Status   Specimen Description BLOOD RIGHT ANTECUBITAL   Final   Special Requests BOTTLES DRAWN AEROBIC AND ANAEROBIC 6CC EACH   Final   Culture NO GROWTH 1 DAY   Final   Report Status PENDING   Incomplete  CULTURE, BLOOD (ROUTINE X 2)     Status: None   Collection Time    05/16/12  5:02 AM      Result Value Range Status   Specimen Description BLOOD RIGHT ANTECUBITAL   Final   Special Requests BOTTLES DRAWN AEROBIC AND ANAEROBIC 8CC EACH   Final   Culture NO GROWTH 1 DAY   Final   Report Status PENDING   Incomplete  URINE CULTURE     Status: None   Collection Time    05/16/12  5:20 AM      Result Value Range Status   Specimen Description URINE, CLEAN CATCH   Final   Special Requests NONE   Final   Culture  Setup Time 05/16/2012 15:36   Final   Colony Count NO GROWTH   Final   Culture NO GROWTH    Final   Report Status 05/16/2012 FINAL   Final  CULTURE, RESPIRATORY (NON-EXPECTORATED)     Status: None   Collection Time    05/16/12  7:39 AM      Result Value Range Status   Specimen Description SPUTUM EXPECTORATED   Final   Special Requests NONE   Final   Gram Stain PENDING   Incomplete   Culture     Final   Value: MODERATE STAPHYLOCOCCUS AUREUS     Note: RIFAMPIN AND GENTAMICIN SHOULD NOT BE USED AS SINGLE DRUGS FOR TREATMENT OF STAPH INFECTIONS.   Report Status PENDING   Incomplete  AFB CULTURE WITH SMEAR     Status:  None   Collection Time    05/17/12  7:33 AM      Result Value Range Status   Specimen Description ENDOTRACHEAL   Final   Special Requests Immunocompromised   Final   ACID FAST SMEAR NO ACID FAST BACILLI SEEN   Final   Culture     Final   Value: CULTURE WILL BE EXAMINED FOR 6 WEEKS BEFORE ISSUING A FINAL REPORT   Report Status PENDING   Incomplete  CULTURE, EXPECTORATED SPUTUM-ASSESSMENT     Status: None   Collection Time    05/18/12  6:35 AM      Result Value Range Status   Specimen Description SPUTUM   Final   Special Requests NONE   Final   Sputum evaluation     Final   Value: THIS SPECIMEN IS ACCEPTABLE. RESPIRATORY CULTURE REPORT TO FOLLOW.   Report Status 05/18/2012 FINAL   Final     Procedures and Diagnostic Studies: Ct Chest W Contrast  05/16/2012  *RADIOLOGY REPORT*  Clinical Data: Chest pain; abnormal chest radiograph.  Recent diagnosis of lymphoma.  40 lbs weight loss over three months, with night sweats, fever and cough.  CT CHEST WITH CONTRAST  Technique:  Multidetector CT imaging of the chest was performed following the standard protocol during bolus administration of intravenous contrast.  Contrast: 80mL OMNIPAQUE IOHEXOL 300 MG/ML  SOLN  Comparison: Chest radiograph performed earlier today at 04:35 a.m.  Findings: Multiple centrally cavitary lesions are identified within the left upper lobe, measuring up to 3.9 cm in size.  Patchy airspace  opacification extends toward the hilum, with an additional small focal opacity seen along the left major fissure.  There appears to be a nearly entirely cavitary 8 mm focus near the left lung apex.  The appearance is rather atypical for malignancy, and raises suspicion for tuberculosis, particularly as this would match the patient's symptoms.  Minimal nodular opacity, measuring at 6 mm, at the right upper lobe (image 30 of 67) could also reflect a small focus of infection. Minimal peripheral nodules are seen within the right lung, nonspecific in appearance.  The right lung is otherwise clear.  No pleural effusion or pneumothorax is identified.  Scattered mediastinal and bilateral hilar lymphadenopathy is seen, with mildly enlarged right paratracheal and aortopulmonary window nodes measuring up to 1.3 cm in short axis, and bilateral hilar nodes measuring up to 1.5 cm in short axis.  The mediastinum is otherwise unremarkable in appearance.  No pericardial effusion is identified.  The great vessels are grossly unremarkable.  A 1.5 cm hypodensity within the right thyroid lobe is nonspecific. The thyroid gland is otherwise unremarkable in appearance.  No axillary lymphadenopathy is seen.  The visualized portions of the liver and spleen are unremarkable in appearance.  The visualized portions of the adrenal glands and both kidneys are within normal limits.  No acute osseous abnormalities are identified.  IMPRESSION:  1.  Multiple centrally cavitary lesions within the left upper lobe, measuring up to 3.9 cm in size.  Additional patchy airspace opacity extends towards the left hilum, with a nearly entirely cavitary 8 mm focus near the left lung apex.  Minimal nodular opacity also noted at the right upper lobe.  This raises suspicion for tuberculosis, particularly given the patient's symptoms; the appearance is atypical for malignancy. 2.  Minimal peripheral nodules within the right lung, nonspecific in appearance. 3.   Scattered mediastinal and bilateral hilar lymphadenopathy, with nodes measuring up to 1.5 cm in short axis.  This may reflect tuberculosis, or possibly the patient's underlying lymphoma. 4.  1.5 cm hypodensity within the right thyroid lobe.  Consider further evaluation with thyroid ultrasound.  If patient is clinically hyperthyroid, consider nuclear medicine thyroid uptake and scan.  These results were called by telephone on 05/16/2012 at 06:52 a.m. to Dr. Devoria Albe, who verbally acknowledged these results.   Original Report Authenticated By: Tonia Ghent, M.D.    US Soft Tissue Head/neck  05/16/2012  *RADIOLOGY REPORT*  Clinical Data: Thyroid nodule identified on neck CT scan.  THYROID ULTRASOUND  Technique: Ultrasound examination of the thyroid gland and adjacent soft tissues was performed.  Comparison:  Neck CT scan 04/17/2012.  Findings:  Right thyroid lobe:  Measures 6.2 x 1.9 x 1.6 cm. Left thyroid lobe:  Measures 4.9 x 1.8 x 1.0 cm. Isthmus:  Measures 0.4 cm in thickness.  Focal nodules:  A cystic and solid nodule measuring 2.5 x 1.2 x 2.2 cm is seen in the lower pole of the right lobe of the thyroid.  No microcalcifications are identified in association with the lesion.  Lymphadenopathy:  Small lymph nodes are seen in both the right and left neck.  Large is in the left neck measuring 1.4 cm in diameter.  IMPRESSION: 2.5 cm solid and cystic nodule in the lower pole of the right lobe of the thyroid.  Recommend fine needle aspiration.  This recommendation follows the consensus statement:  Management of Thyroid Nodules Detected as [place]:  Society of Radiologists in Ultrasound Consensus Conference Statement.  Radiology 2005; 237:794- 800.   Original Report Authenticated By: Holley Dexter, M.D.    Dg Chest Port 1 View  05/16/2012  *RADIOLOGY REPORT*  Clinical Data: Left-sided chest pain and pressure.  Recent diagnosis of lymphoma.  PORTABLE CHEST - 1 VIEW  Comparison: Chest radiograph performed 07/15/2005   Findings: The lungs are well-aerated.  Focal airspace opacification is noted at the left upper lung zone; its appearance is suspicious for a mass.  There is suggestion of associated cavitation, and it could reflect either malignancy or less likely tuberculosis, depending on the patient's symptoms.  No pleural effusion or pneumothorax is seen.  The cardiomediastinal silhouette is within normal limits.  No acute osseous abnormalities are seen.  IMPRESSION: Focal airspace opacification at the left upper lung zone, suspicious for a mass.  Suggestion of associated cavitation; this could reflect either malignancy or less likely tuberculosis, depending on the patient's symptoms.   Original Report Authenticated By: Tonia Ghent, M.D.     Scheduled Meds: . cefTRIAXone (ROCEPHIN)  IV  1 g Intravenous Q24H  . clindamycin (CLEOCIN) IV  900 mg Intravenous Q8H  . feeding supplement  1 Container Oral TID BM  . fluconazole  100 mg Oral Daily  . lactose free nutrition  237 mL Oral QID  . multivitamin  5 mL Oral Daily  . nicotine  21 mg Transdermal Daily  . pantoprazole  40 mg Oral Daily  . vancomycin  500 mg Intravenous Q12H   Continuous Infusions: . sodium chloride 1,000 mL (05/19/12 0535)    Time spent: 25 minutes.   LOS: 3 days   RAMA,CHRISTINA  [facility] Pager [phone].  If 8PM-8AM, please contact night-coverage at [email or web address], password [facility] 05/19/2012, 7:38 AM

## 2012-05-19 NOTE — Progress Notes (Signed)
PHARMACY BRIEF NOTE - Drug Level Result  Consult for:  Vancomycin Indication:  Cavitary pneumonia  The current Vancomycin dose is 500 mg IV every 12 hours.  The Vancomycin trough level drawn prior to the 4 pm dose today is reported as < 5.0 mcg/ml.  This level is below the therapeutic range (15-20 mcg/ml).  Plan:  Give 1000 mg IV x 1 as a loading dose.  Increase the maintenance dose to 750 mg IV every 8 hours.  Repeat the trough level as needed.  Polo Riley R.Ph. 05/19/2012 6:09 PM

## 2012-05-19 NOTE — Progress Notes (Signed)
Patient ID: Nicholas King, male   DOB: [birth year], 42 y.o.   MRN: [id number]         [facility]    Date of Admission:  05/16/2012   Total days of antibiotics 5         Principal Problem:   Staph Aureus Pneumonia with Cavitary lesion of lung, fever and chest pain Active Problems:   Fever   Diffuse large B cell lymphoma   Other malignant lymphomas, unspecified site, extranodal and solid organ sites   Cachexia   Thrush, oral   Dysphagia, unspecified   Chest pain   Severe malnutrition in the context of chronic illness   GERD (gastroesophageal reflux disease)   Thyroid nodule   Anemia   Cigarette smoker   Underweight   Adverse drug reaction   Rash and nonspecific skin eruption   . clindamycin (CLEOCIN) IV  900 mg Intravenous Q8H  . feeding supplement  1 Container Oral TID BM  . fluconazole  100 mg Oral Daily  . lactose free nutrition  237 mL Oral QID  . multivitamin  5 mL Oral Daily  . nicotine  21 mg Transdermal Daily  . pantoprazole  40 mg Oral Daily  . vancomycin  500 mg Intravenous Q12H    Subjective: He is feeling much better. His cough and sputum production have decreased dramatically since admission. He is eager to go home.  Objective: Temp:  [98.3 F (36.8 C)-98.4 F (36.9 C)] 98.4 F (36.9 C) (04/14 0559) Pulse Rate:  [66-87] 66 (04/14 0559) Resp:  [16-18] 16 (04/14 0559) BP: (105-114)/(65-71) 106/70 mmHg (04/14 0559) SpO2:  [99 %-100 %] 100 % (04/14 0559) Weight:  [48.8 kg (107 lb 9.4 oz)] 48.8 kg (107 lb 9.4 oz) (04/14 0559)  General: He is in good spirits. He is looking much better. Skin: No rash Lungs: Clear Cor: Regular S1-S2 no murmurs  Microbiology: Recent Results (from the past 240 hour(s))  CULTURE, BLOOD (ROUTINE X 2)     Status: None   Collection Time    05/16/12  5:02 AM      Result Value Range Status   Specimen Description BLOOD RIGHT ANTECUBITAL   Final   Special Requests BOTTLES DRAWN AEROBIC AND  ANAEROBIC 6CC EACH   Final   Culture NO GROWTH 1 DAY   Final   Report Status PENDING   Incomplete  CULTURE, BLOOD (ROUTINE X 2)     Status: None   Collection Time    05/16/12  5:02 AM      Result Value Range Status   Specimen Description BLOOD RIGHT ANTECUBITAL   Final   Special Requests BOTTLES DRAWN AEROBIC AND ANAEROBIC 8CC EACH   Final   Culture NO GROWTH 1 DAY   Final   Report Status PENDING   Incomplete  URINE CULTURE     Status: None   Collection Time    05/16/12  5:20 AM      Result Value Range Status   Specimen Description URINE, CLEAN CATCH   Final   Special Requests NONE   Final   Culture  Setup Time 05/16/2012 15:36   Final   Colony Count NO GROWTH   Final   Culture NO GROWTH   Final   Report Status 05/16/2012 FINAL   Final  CULTURE, RESPIRATORY (NON-EXPECTORATED)     Status: None   Collection Time    05/16/12  7:39 AM      Result Value Range Status  Specimen Description SPUTUM EXPECTORATED   Final   Special Requests NONE   Final   Gram Stain PENDING   Incomplete   Culture     Final   Value: MODERATE STAPHYLOCOCCUS AUREUS     Note: RIFAMPIN AND GENTAMICIN SHOULD NOT BE USED AS SINGLE DRUGS FOR TREATMENT OF STAPH INFECTIONS.   Report Status PENDING   Incomplete  AFB CULTURE WITH SMEAR     Status: None   Collection Time    05/17/12  7:33 AM      Result Value Range Status   Specimen Description ENDOTRACHEAL   Final   Special Requests Immunocompromised   Final   ACID FAST SMEAR NO ACID FAST BACILLI SEEN   Final   Culture     Final   Value: CULTURE WILL BE EXAMINED FOR 6 WEEKS BEFORE ISSUING A FINAL REPORT   Report Status PENDING   Incomplete  CULTURE, EXPECTORATED SPUTUM-ASSESSMENT     Status: None   Collection Time    05/18/12  6:35 AM      Result Value Range Status   Specimen Description SPUTUM   Final   Special Requests NONE   Final   Sputum evaluation     Final   Value: THIS SPECIMEN IS ACCEPTABLE. RESPIRATORY CULTURE REPORT TO FOLLOW.   Report Status  05/18/2012 FINAL   Final  CULTURE, RESPIRATORY (NON-EXPECTORATED)     Status: None   Collection Time    05/18/12  6:35 AM      Result Value Range Status   Specimen Description SPUTUM   Final   Special Requests NONE   Final   Gram Stain     Final   Value: MODERATE WBC PRESENT, PREDOMINANTLY PMN     RARE SQUAMOUS EPITHELIAL CELLS PRESENT     MODERATE GRAM NEGATIVE RODS     FEW GRAM POSITIVE RODS   Culture Culture reincubated for better growth   Final   Report Status PENDING   Incomplete   Assessment: He is improving on empiric therapy for lung abscess. His first sputum culture is growing staph aureus but susceptibilities are pending. Unfortunately a Gram stain was not done on that initial specimen from April 11. A repeat specimen on April 13 showed gram-positive cocci and gram-negative rods on stain. That culture is growing an alpha hemolytic strep species. No gram-negative rods have been isolated. His AFB stain is negative. His lung abscess could be do to appear staph aureus infection but with multiple organisms seen on stain and cultures this may be a mixed aerobic anaerobic abscess. I would continue vancomycin and clindamycin pending final culture results.  Plan: 1. Continue vancomycin and clindamycin 2. I will followup tomorrow  Cliffton Asters, MD [facility] [phone] pager   [phone] cell 05/19/2012, 9:45 AM

## 2012-05-19 NOTE — Telephone Encounter (Signed)
appt made for NUT on 4.21.14 while pt is in chemo

## 2012-05-19 NOTE — Progress Notes (Signed)
ANTIBIOTIC CONSULT NOTE - Follow Up  Pharmacy Consult for Vancomycin  Indication: Cavitary PNA  Allergies  Allergen Reactions  . Penicillins   . Amoxicillin Rash  . Hydrocodone Rash    Patient Measurements: Height: 5\' 7"  (170.2 cm) Weight: 107 lb 9.4 oz (48.8 kg) IBW/kg (Calculated) : 66.1   Vital Signs: Temp: 98.4 F (36.9 C) (04/14 0559) Temp src: Oral (04/14 0559) BP: 106/70 mmHg (04/14 0559) Pulse Rate: 66 (04/14 0559) Intake/Output from previous day: 04/13 0701 - 04/14 0700 In: 1195 [P.O.:720; I.V.:475] Out: -  Intake/Output from this shift: Total I/O In: 360 [P.O.:360] Out: -   Labs:  Recent Labs  05/16/12 1330 05/17/12 0410  WBC 7.6 4.4  HGB 9.5* 10.3*  PLT 300 260  CREATININE 0.72 0.68   Estimated Creatinine Clearance: 83.9 ml/min (by C-G formula based on Cr of 0.68). No results found for this basename: VANCOTROUGH, Leodis Binet, VANCORANDOM, GENTTROUGH, GENTPEAK, GENTRANDOM, TOBRATROUGH, TOBRAPEAK, TOBRARND, AMIKACINPEAK, AMIKACINTROU, AMIKACIN,  in the last 72 hours   Microbiology: Recent Results (from the past 720 hour(s))  SURGICAL PCR SCREEN     Status: None   Collection Time    05/05/12 11:54 AM      Result Value Range Status   MRSA, PCR NEGATIVE  NEGATIVE Final   Staphylococcus aureus NEGATIVE  NEGATIVE Final   Comment:            The Xpert SA Assay (FDA     approved for NASAL specimens     in patients over 44 years of age),     is one component of     a comprehensive surveillance     program.  Test performance has     been validated by [employer] for patients greater     than or equal to 110 year old.     It is not intended     to diagnose infection nor to     guide or monitor treatment.  CULTURE, BLOOD (ROUTINE X 2)     Status: None   Collection Time    05/16/12  5:02 AM      Result Value Range Status   Specimen Description BLOOD RIGHT ANTECUBITAL   Final   Special Requests BOTTLES DRAWN AEROBIC AND ANAEROBIC 6CC EACH   Final    Culture NO GROWTH 3 DAYS   Final   Report Status PENDING   Incomplete  CULTURE, BLOOD (ROUTINE X 2)     Status: None   Collection Time    05/16/12  5:02 AM      Result Value Range Status   Specimen Description BLOOD RIGHT ANTECUBITAL   Final   Special Requests BOTTLES DRAWN AEROBIC AND ANAEROBIC 8CC EACH   Final   Culture NO GROWTH 3 DAYS   Final   Report Status PENDING   Incomplete  URINE CULTURE     Status: None   Collection Time    05/16/12  5:20 AM      Result Value Range Status   Specimen Description URINE, CLEAN CATCH   Final   Special Requests NONE   Final   Culture  Setup Time 05/16/2012 15:36   Final   Colony Count NO GROWTH   Final   Culture NO GROWTH   Final   Report Status 05/16/2012 FINAL   Final  CULTURE, RESPIRATORY (NON-EXPECTORATED)     Status: None   Collection Time    05/16/12  7:39 AM  Result Value Range Status   Specimen Description SPUTUM EXPECTORATED   Final   Special Requests NONE   Final   Gram Stain     Final   Value: RARE WBC PRESENT,BOTH PMN AND MONONUCLEAR     RARE SQUAMOUS EPITHELIAL CELLS PRESENT     FEW YEAST     FEW GRAM POSITIVE RODS     FEW GRAM POSITIVE COCCI IN PAIRS   Culture     Final   Value: MODERATE STAPHYLOCOCCUS AUREUS     Note: RIFAMPIN AND GENTAMICIN SHOULD NOT BE USED AS SINGLE DRUGS FOR TREATMENT OF STAPH INFECTIONS.   Report Status 05/19/2012 FINAL   Final   Organism ID, Bacteria STAPHYLOCOCCUS AUREUS   Final  AFB CULTURE WITH SMEAR     Status: None   Collection Time    05/17/12  7:33 AM      Result Value Range Status   Specimen Description ENDOTRACHEAL   Final   Special Requests Immunocompromised   Final   ACID FAST SMEAR NO ACID FAST BACILLI SEEN   Final   Culture     Final   Value: CULTURE WILL BE EXAMINED FOR 6 WEEKS BEFORE ISSUING A FINAL REPORT   Report Status PENDING   Incomplete  CULTURE, EXPECTORATED SPUTUM-ASSESSMENT     Status: None   Collection Time    05/18/12  6:35 AM      Result Value Range Status    Specimen Description SPUTUM   Final   Special Requests NONE   Final   Sputum evaluation     Final   Value: THIS SPECIMEN IS ACCEPTABLE. RESPIRATORY CULTURE REPORT TO FOLLOW.   Report Status 05/18/2012 FINAL   Final  CULTURE, RESPIRATORY (NON-EXPECTORATED)     Status: None   Collection Time    05/18/12  6:35 AM      Result Value Range Status   Specimen Description SPUTUM   Final   Special Requests NONE   Final   Gram Stain     Final   Value: MODERATE WBC PRESENT, PREDOMINANTLY PMN     RARE SQUAMOUS EPITHELIAL CELLS PRESENT     MODERATE GRAM NEGATIVE RODS     FEW GRAM POSITIVE RODS   Culture Culture reincubated for better growth   Final   Report Status PENDING   Incomplete    Medical History: Past Medical History  Diagnosis Date  . GERD (gastroesophageal reflux disease)   . Cancer   . Lymphoma   . Dysphagia, unspecified 05/16/2012    Medications:  Prescriptions prior to admission  Medication Sig Dispense Refill  . amoxicillin-clavulanate (AUGMENTIN) 875-125 MG per tablet Take 1 tablet by mouth 2 (two) times daily.  14 tablet  0  . oxyCODONE-acetaminophen (PERCOCET) 7.5-325 MG per tablet Take 1 tablet by mouth every 6 (six) hours as needed for pain.  90 tablet  0  . lidocaine-prilocaine (EMLA) cream Apply topically as needed.  30 g  2  . morphine (MS CONTIN) 15 MG 12 hr tablet Take 1 tablet (15 mg total) by mouth 2 (two) times daily.  60 tablet  0  . ondansetron (ZOFRAN) 8 MG tablet Take 1 tablet (8 mg total) by mouth every 12 (twelve) hours as needed. Take two times a day starting the day after chemo for 3 days. Then take two times a day as needed for nausea or vomiting.  30 tablet  1  . predniSONE (DELTASONE) 20 MG tablet Take 3 tablets (60 mg total) by mouth  daily. Take on days 1-5 of chemotherapy.  45 tablet  0  . prochlorperazine (COMPAZINE) 10 MG tablet Take 1 tablet (10 mg total) by mouth every 6 (six) hours as needed (Nausea or vomiting).  30 tablet  3   Anti-infectives    Start     Dose/Rate Route Frequency Ordered Stop   05/17/12 1000  fluconazole (DIFLUCAN) 40 MG/ML suspension 100 mg     100 mg Oral Daily 05/16/12 1259     05/16/12 1600  vancomycin (VANCOCIN) 500 mg in sodium chloride 0.9 % 100 mL IVPB     500 mg 100 mL/hr over 60 Minutes Intravenous Every 12 hours 05/16/12 1323     05/16/12 1500  cefTRIAXone (ROCEPHIN) 1 g in dextrose 5 % 50 mL IVPB  Status:  Discontinued     1 g 100 mL/hr over 30 Minutes Intravenous Every 24 hours 05/16/12 1323 05/19/12 0741   05/16/12 1400  clindamycin (CLEOCIN) IVPB 900 mg     900 mg 100 mL/hr over 30 Minutes Intravenous 3 times per day 05/16/12 1257     05/16/12 1400  fluconazole (DIFLUCAN) IVPB 200 mg     200 mg 100 mL/hr over 60 Minutes Intravenous  Once 05/16/12 1259 05/16/12 1531   05/16/12 0700  levofloxacin (LEVAQUIN) IVPB 500 mg     500 mg 100 mL/hr over 60 Minutes Intravenous  Once 05/16/12 0659 05/16/12 1610     Assessment: 42 yo M recently diagnosed with B-cell lymphoma planning to see Dr. Gaylyn Rong and start R-CHOP soon, presented to [facility] 4/11 with low-grade fevers, and CT showed cavitary lung lesion  Day #4 vancomycin 500mg  IV q12h - pharmacy dosing  Pt is also on D#4 Fluconazole 100mg  daily and Clindaycin 957m gIV q8h - MD dosing  Scr WNL, CrCL 85ml/min (>128ml/min/1.73m2)  WBC wnl, afebrile  Cx:   4/12 endotrach sample = No AFB  4/13 Sputum cx in process  4/11 Sputum cx = MSSA  4/11 AFB pending  4/11 Urine cx (NG Final)  4/11 Blood cx x 2 NGTD  4/11 Quantiferon TB gold in process  Noted ID MD doubts TB given improvement on current abx  Goal of Therapy:  Vancomycin trough level 15-20 mcg/ml  Plan:   Continue Vancomycin 500mg  IV q12h.  VT tonight  Gwen Her PharmD  [phone] 05/19/2012 12:52 PM

## 2012-05-19 NOTE — Procedures (Signed)
Interventional Radiology Procedure Note  Procedure: US guided FNA right thyroid nodule Complications: None Recommendations: - resume normal activity - No restrictions  Signed,  Sterling Big, MD Vascular & Interventional Radiologist [facility] Radiology

## 2012-05-19 NOTE — Progress Notes (Signed)
Occupational Therapy Treatment and Discharge Summary Patient Details Name: BEJAMIN HACKBART MRN: [id number] DOB: [birth year] Today's Date: 05/19/2012 Time: 4034-7425 OT Time Calculation (min): 9 min  OT Assessment / Plan / Recommendation Comments on Treatment Session Pt no longer in need of OT.    Follow Up Recommendations  No OT follow up    Barriers to Discharge       Equipment Recommendations  Other (comment) (No need for tub bench at this time)    Recommendations for Other Services    Frequency Other (comment) (d/c OT)   Plan Discharge plan remains appropriate    Precautions / Restrictions Precautions Precautions: Fall Precaution Comments: Pt more steady on his feet today Restrictions Weight Bearing Restrictions: No   Pertinent Vitals/Pain Pt with 5/10 pain in throat.    ADL  Eating/Feeding: Performed;Independent Where Assessed - Eating/Feeding: Edge of bed Grooming: Performed;Wash/dry hands;Independent Where Assessed - Grooming: Unsupported standing Tub/Shower Transfer: Performed;Independent Tub/Shower Transfer Method: Ambulating Transfers/Ambulation Related to ADLs: Pt able to safely simulate shower transfer standing.  Do not feel pt needs a tub bench.  Rec shower chair (pt has) if he gets fatigued when showering. ADL Comments: Pt gathered all clothes Ily from closets.    OT Diagnosis:    OT Problem List:   OT Treatment Interventions:     OT Goals Acute Rehab OT Goals OT Goal Formulation: With patient Time For Goal Achievement: 06/01/12 Potential to Achieve Goals: Good ADL Goals Pt Will Perform Tub/Shower Transfer: with modified independence;Transfer tub bench ADL Goal: Tub/Shower Transfer - Progress: Met Miscellaneous OT Goals Miscellaneous OT Goal #1: pt will gather clothes at supervision level OT Goal: Miscellaneous Goal #1 - Progress: Met  Visit Information  Last OT Received On: 05/19/12 Assistance Needed: +1    Subjective Data      Prior  Functioning       Cognition  Cognition Overall Cognitive Status: Appears within functional limits for tasks assessed/performed Arousal/Alertness: Awake/alert Orientation Level: Appears intact for tasks assessed Behavior During Session: [facility] for tasks performed    Mobility  Bed Mobility Bed Mobility: Supine to Sit;Sit to Supine Supine to Sit: 7: Independent;HOB flat Sit to Supine: 7: Independent Transfers Transfers: Sit to Stand;Stand to Sit Sit to Stand: 7: Independent Stand to Sit: 7: Independent Details for Transfer Assistance: no assist needed with mobility    Exercises      Balance     End of Session OT - End of Session Activity Tolerance: Patient tolerated treatment well Patient left: in bed;with call bell/phone within reach;with family/visitor present Nurse Communication: Mobility status  GO     Hope Budds 05/19/2012, 9:37 AM [phone]

## 2012-05-20 ENCOUNTER — Other Ambulatory Visit: Payer: Self-pay

## 2012-05-20 ENCOUNTER — Inpatient Hospital Stay (HOSPITAL_COMMUNITY): Admission: RE | Admit: 2012-05-20 | Payer: Medicaid Other | Source: Ambulatory Visit

## 2012-05-20 DIAGNOSIS — J15211 Pneumonia due to Methicillin susceptible Staphylococcus aureus: Principal | ICD-10-CM

## 2012-05-20 LAB — CULTURE, RESPIRATORY W GRAM STAIN

## 2012-05-20 MED ORDER — MAGIC MOUTHWASH W/LIDOCAINE: 15.0000 mL | Freq: Four times a day (QID) | ORAL | Status: DC | PRN

## 2012-05-20 MED ORDER — SULFAMETHOXAZOLE-TRIMETHOPRIM 800-160 MG PO TABS: 1.0000 | ORAL_TABLET | Freq: Two times a day (BID) | ORAL | Status: DC

## 2012-05-20 MED ORDER — FLUCONAZOLE 100 MG PO TABS: 100.0000 mg | ORAL_TABLET | ORAL | Status: DC

## 2012-05-20 NOTE — Discharge Summary (Signed)
Physician Discharge Summary  Nicholas King NWG:[id number] DOB: 05/11/1970 DOA: 05/16/2012  PCP: No PCP Per Patient  Admit date: 05/16/2012 Discharge date: 05/20/2012  Recommendations for Outpatient Follow-up:  1. Followup with Dr. Orvan Falconer of infectious diseases in 1 week. 2. Followup with Dr. Clelia Croft of oncology in 2 weeks. 3. Case manager to assist with medications. Patient is uninsured and did not fill his previous prescriptions. 4. Followup final AFB sputum cultures. 5. Followup final blood culture results (negative to date). 6. Followup pathology from thyroid biopsy.  Discharge Diagnoses:  Principal Problem:    Staph Aureus Pneumonia with Cavitary lesion of lung, fever and chest pain Active Problems:    Other malignant lymphomas, unspecified site, extranodal and solid organ sites    Fever    Cachexia    Diffuse large B cell lymphoma    Thrush, oral    Dysphagia, unspecified    Chest pain    Severe malnutrition in the context of chronic illness    GERD (gastroesophageal reflux disease)    Thyroid nodule    Anemia    Cigarette smoker    Underweight    Adverse drug reaction    Rash and nonspecific skin eruption   Discharge Condition: Improved.  Diet recommendation: Regular.  History of present illness:  Nicholas King is an 42 y.o. male with a past medical history of large B cell lymphoma, diagnosed by biopsy of a pharyngeal mass on 05/08/2012. He presented to the hospital on 05/16/2012 with left-sided chest pain, fever, chills, and increased cough and was found to have a cavitary pneumonia. Both oncology and infectious disease consultantions were requested on admission. Patient has improved on empiric antibiotics.  Hospital Course by problem:  Principal Problem:  Staph Aureus pneumonia with cavitary lesion of lung, fever and chest pain  -Admitted and placed on empiric antibiotics. Clinically improved on empiric therapy.  -Sputum cultures grew MSSA.   AFB smear negative. Followup final AFB cultures. -Treated with empiric Rocephin, vancomycin, and clindamycin while in the hospital. We'll send home on 3 weeks of therapy with Septra based on sensitivity data. Penicillin allergic. -Quantiferon TB test was negative.  -Followup blood cultures (negative to date).  -HIV antibody negative.  -Two-dimensional echocardiogram: EF 50-55%, normal LV function.  Active Problems:  Other malignant lymphomas, unspecified site, extranodal and solid organ sites / Diffuse large B cell lymphoma  -Dr. Clelia Croft following. Staging workup is ongoing.  -Treatment to be initiated once his fever workup is complete and active infection under control.  Cachexia with severe malnutrition malnutrition in the context of chronic illness/underweight  -Seen and evaluated by dietitian on 05/16/2012. Continue interventions as recommended.  Thrush, oral  -Treated with Diflucan and Magic mouthwash while in the hospital.  -Will place on once weekly suppressive therapy at discharge. Dysphagia, unspecified  -Seen by speech therapy on 05/17/2012. Adequate oral and oral pharyngeal function with thin liquids and soft solids.  GERD (gastroesophageal reflux disease)  -Treated with inpatient PPI therapy.  Thyroid nodule  -T3 low, free T3, free T4 and TSH within normal limits.  -Status post thyroid ultrasound which showed a solid/cystic mass. -Status post fine-needle aspiration with biopsy. Will need to be followed up on post discharge.  Anemia  -Likely anemia of chronic disease. Hemoglobin stable.  Cigarette smoker  -Tobacco cessation counseling and nursing staff. Maintained on a nicotine patch while in the hospital.  Adverse drug reaction/rash  -Macular erythematous rash, temporally related to treatment with Augmentin. -Penicillins now listed as allergy.  Medical Consultants:  Dr. Cliffton Asters, ID  Dr. Eli Hose, Oncology. Other Consultants:  Speech therapy  Occupational therapy   Physical therapy   Discharge Exam: Filed Vitals:   05/20/12 0721  BP: 118/85  Pulse: 90  Temp: 98.1 F (36.7 C)  Resp: 16   Filed Vitals:   05/19/12 1421 05/19/12 2315 05/20/12 0500 05/20/12 0721  BP: 130/87 129/79  118/85  Pulse: 87 73  90  Temp: 98.2 F (36.8 C) 98.3 F (36.8 C)  98.1 F (36.7 C)  TempSrc:  Oral  Oral  Resp: 18 16  16   Height:      Weight:   51.03 kg (112 lb 8 oz)   SpO2: 97% 100%  100%    Gen:  NAD Cardiovascular:  RRR, No M/R/G Respiratory: Lungs CTAB Gastrointestinal: Abdomen soft, NT/ND with normal active bowel sounds. Extremities: No C/E/C   Discharge Instructions      Discharge Orders   Future Appointments Provider Department Dept Phone   05/20/2012 12:00 PM [facility] [phone]   05/26/2012 10:30 AM Exie Parody, MD [place] [facility] [phone]   05/26/2012 12:00 PM [facility] [place] [facility] [phone]   05/26/2012 2:30 PM Anabel Bene, RD [place] [facility] [phone]   05/27/2012 2:00 PM [facility] Inj Nurse [place] [facility] [phone]   06/16/2012 10:00 AM [facility] D13 [place] [facility] [phone]   06/17/2012 2:00 PM [facility] Inj Nurse [facility] [place] [facility] [phone]   Future Orders Complete By Expires     Call MD for:  persistant nausea and vomiting  As directed     Call MD for:  severe uncontrolled pain  As directed     Call MD for:  temperature >100.4  As directed     Diet general  As directed     Increase activity slowly  As directed         Medication List    STOP taking these medications       amoxicillin-clavulanate 875-125 MG per tablet  Commonly known as:  AUGMENTIN      TAKE these medications       fluconazole 100 MG tablet  Commonly known as:  DIFLUCAN  Take 1 tablet (100 mg total) by  mouth once a week.     lidocaine-prilocaine cream  Commonly known as:  EMLA  Apply topically as needed.     magic mouthwash w/lidocaine Soln  Take 15 mLs by mouth 4 (four) times daily as needed.     morphine 15 MG 12 hr tablet  Commonly known as:  MS CONTIN  Take 1 tablet (15 mg total) by mouth 2 (two) times daily.     ondansetron 8 MG tablet  Commonly known as:  ZOFRAN  Take 1 tablet (8 mg total) by mouth every 12 (twelve) hours as needed. Take two times a day starting the day after chemo for 3 days. Then take two times a day as needed for nausea or vomiting.     oxyCODONE-acetaminophen 7.5-325 MG per tablet  Commonly known as:  PERCOCET  Take 1 tablet by mouth every 6 (six) hours as needed for pain.     predniSONE 20 MG tablet  Commonly known as:  DELTASONE  Take 3 tablets (60 mg total) by mouth daily. Take on days 1-5 of chemotherapy.     prochlorperazine 10  MG tablet  Commonly known as:  COMPAZINE  Take 1 tablet (10 mg total) by mouth every 6 (six) hours as needed (Nausea or vomiting).     sulfamethoxazole-trimethoprim 800-160 MG per tablet  Commonly known as:  BACTRIM DS  Take 1 tablet by mouth 2 (two) times daily.       Follow-up Information   Follow up with [facility], MD. Schedule an appointment as soon as possible for a visit in 2 weeks. ((Or Dr. Clelia Croft))    Contact information:   [address] [place] [address] [phone]       Follow up with Cliffton Asters, MD. Schedule an appointment as soon as possible for a visit in 1 week.   Contact information:   [address] [place] [address] [phone]        The results of significant diagnostics from this hospitalization (including imaging, microbiology, ancillary and laboratory) are listed below for reference.    Significant Diagnostic Studies: Ct Chest W Contrast  05/16/2012  *RADIOLOGY REPORT*  Clinical Data: Chest pain; abnormal chest radiograph.  Recent diagnosis of lymphoma.   40 lbs weight loss over three months, with night sweats, fever and cough.  CT CHEST WITH CONTRAST  Technique:  Multidetector CT imaging of the chest was performed following the standard protocol during bolus administration of intravenous contrast.  Contrast: 80mL OMNIPAQUE IOHEXOL 300 MG/ML  SOLN  Comparison: Chest radiograph performed earlier today at 04:35 a.m.  Findings: Multiple centrally cavitary lesions are identified within the left upper lobe, measuring up to 3.9 cm in size.  Patchy airspace opacification extends toward the hilum, with an additional small focal opacity seen along the left major fissure.  There appears to be a nearly entirely cavitary 8 mm focus near the left lung apex.  The appearance is rather atypical for malignancy, and raises suspicion for tuberculosis, particularly as this would match the patient's symptoms.  Minimal nodular opacity, measuring at 6 mm, at the right upper lobe (image 30 of 67) could also reflect a small focus of infection. Minimal peripheral nodules are seen within the right lung, nonspecific in appearance.  The right lung is otherwise clear.  No pleural effusion or pneumothorax is identified.  Scattered mediastinal and bilateral hilar lymphadenopathy is seen, with mildly enlarged right paratracheal and aortopulmonary window nodes measuring up to 1.3 cm in short axis, and bilateral hilar nodes measuring up to 1.5 cm in short axis.  The mediastinum is otherwise unremarkable in appearance.  No pericardial effusion is identified.  The great vessels are grossly unremarkable.  A 1.5 cm hypodensity within the right thyroid lobe is nonspecific. The thyroid gland is otherwise unremarkable in appearance.  No axillary lymphadenopathy is seen.  The visualized portions of the liver and spleen are unremarkable in appearance.  The visualized portions of the adrenal glands and both kidneys are within normal limits.  No acute osseous abnormalities are identified.  IMPRESSION:  1.   Multiple centrally cavitary lesions within the left upper lobe, measuring up to 3.9 cm in size.  Additional patchy airspace opacity extends towards the left hilum, with a nearly entirely cavitary 8 mm focus near the left lung apex.  Minimal nodular opacity also noted at the right upper lobe.  This raises suspicion for tuberculosis, particularly given the patient's symptoms; the appearance is atypical for malignancy. 2.  Minimal peripheral nodules within the right lung, nonspecific in appearance. 3.  Scattered mediastinal and bilateral hilar lymphadenopathy, with nodes measuring up  to 1.5 cm in short axis.  This may reflect tuberculosis, or possibly the patient's underlying lymphoma. 4.  1.5 cm hypodensity within the right thyroid lobe.  Consider further evaluation with thyroid ultrasound.  If patient is clinically hyperthyroid, consider nuclear medicine thyroid uptake and scan.  These results were called by telephone on 05/16/2012 at 06:52 a.m. to Dr. Devoria Albe, who verbally acknowledged these results.   Original Report Authenticated By: Tonia Ghent, M.D.    US Soft Tissue Head/neck  05/16/2012  *RADIOLOGY REPORT*  Clinical Data: Thyroid nodule identified on neck CT scan.  THYROID ULTRASOUND  Technique: Ultrasound examination of the thyroid gland and adjacent soft tissues was performed.  Comparison:  Neck CT scan 04/17/2012.  Findings:  Right thyroid lobe:  Measures 6.2 x 1.9 x 1.6 cm. Left thyroid lobe:  Measures 4.9 x 1.8 x 1.0 cm. Isthmus:  Measures 0.4 cm in thickness.  Focal nodules:  A cystic and solid nodule measuring 2.5 x 1.2 x 2.2 cm is seen in the lower pole of the right lobe of the thyroid.  No microcalcifications are identified in association with the lesion.  Lymphadenopathy:  Small lymph nodes are seen in both the right and left neck.  Large is in the left neck measuring 1.4 cm in diameter.  IMPRESSION: 2.5 cm solid and cystic nodule in the lower pole of the right lobe of the thyroid.  Recommend  fine needle aspiration.  This recommendation follows the consensus statement:  Management of Thyroid Nodules Detected as [place]:  Society of Radiologists in Ultrasound Consensus Conference Statement.  Radiology 2005; 237:794- 800.   Original Report Authenticated By: Holley Dexter, M.D.    US Thyroid Biopsy  05/19/2012  *RADIOLOGY REPORT*  Clinical Data:  42 year old male with a 2.5 cm mixed cystic and solid right thyroid nodule which meets consensus criteria for fine needle aspiration biopsy.  ULTRASOUND GUIDED NEEDLE ASPIRATE BIOPSY OF THE THYROID GLAND  Comparison: Thyroid ultrasound 05/16/2012  Thyroid biopsy was thoroughly discussed with the patient and questions were answered.  The benefits, risks, alternatives, and complications were also discussed.  The patient understands and wishes to proceed with the procedure.  Written consent was obtained.  Ultrasound was performed to localize and mark an adequate site for the biopsy.  The patient was then prepped and draped in a normal sterile fashion.  Local anesthesia was provided with 1% lidocaine. Using direct ultrasound guidance, for passes were made using 25 gauge needles into the nodule within the right lobe of the thyroid. Ultrasound was used to confirm needle placements on all occasions. Specimens were sent to Pathology for analysis.  Complications:  None  Findings:  2.5 cm mixed cystic and solid thyroid nodule.  IMPRESSION:  Technically successful ultrasound guided needle aspirate biopsy performed of the right thyroid nodule.  Signed,  Sterling Big, MD Vascular & Interventional Radiologist [facility] Radiology   Original Report Authenticated By: Malachy Moan, M.D.    Dg Chest Port 1 View  05/16/2012  *RADIOLOGY REPORT*  Clinical Data: Left-sided chest pain and pressure.  Recent diagnosis of lymphoma.  PORTABLE CHEST - 1 VIEW  Comparison: Chest radiograph performed 07/15/2005  Findings: The lungs are well-aerated.  Focal airspace opacification  is noted at the left upper lung zone; its appearance is suspicious for a mass.  There is suggestion of associated cavitation, and it could reflect either malignancy or less likely tuberculosis, depending on the patient's symptoms.  No pleural effusion or pneumothorax is seen.  The cardiomediastinal silhouette  is within normal limits.  No acute osseous abnormalities are seen.  IMPRESSION: Focal airspace opacification at the left upper lung zone, suspicious for a mass.  Suggestion of associated cavitation; this could reflect either malignancy or less likely tuberculosis, depending on the patient's symptoms.   Original Report Authenticated By: Tonia Ghent, M.D.     Labs:  Basic Metabolic Panel:  Recent Labs Lab 05/16/12 0417 05/16/12 1329 05/16/12 1330 05/17/12 0410  NA 132*  --   --  132*  K 4.8  --   --  3.6  CL 94*  --   --  98  CO2 29  --   --  26  GLUCOSE 99  --   --  92  BUN 16  --   --  6  CREATININE 0.92  --  0.72 0.68  CALCIUM 8.6  --   --  7.7*  MG  --   --  1.8  --   PHOS  --  3.0  --   --    GFR Estimated Creatinine Clearance: 87.7 ml/min (by C-G formula based on Cr of 0.68). Liver Function Tests:  Recent Labs Lab 05/16/12 1329 05/17/12 0410  AST 16 19  ALT 14 13  ALKPHOS 45 44  BILITOT 0.3 0.4  PROT 5.5* 5.4*  ALBUMIN 2.0* 2.0*   Coagulation profile  Recent Labs Lab 05/16/12 1329  INR 1.38    CBC:  Recent Labs Lab 05/16/12 0417 05/16/12 1330 05/17/12 0410  WBC 8.0 7.6 4.4  HGB 11.7* 9.5* 10.3*  HCT 35.5* 29.5* 32.1*  MCV 82.4 83.3 82.9  PLT 309 300 260   Cardiac Enzymes:  Recent Labs Lab 05/16/12 0417 05/16/12 1330 05/16/12 1842  TROPONINI <0.30 <0.30 <0.30   CBG:  Recent Labs Lab 05/17/12 0803  GLUCAP 78   Microbiology Recent Results (from the past 240 hour(s))  CULTURE, BLOOD (ROUTINE X 2)     Status: None   Collection Time    05/16/12  5:02 AM      Result Value Range Status   Specimen Description BLOOD RIGHT ANTECUBITAL    Final   Special Requests BOTTLES DRAWN AEROBIC AND ANAEROBIC 6CC EACH   Final   Culture NO GROWTH 4 DAYS   Final   Report Status PENDING   Incomplete  CULTURE, BLOOD (ROUTINE X 2)     Status: None   Collection Time    05/16/12  5:02 AM      Result Value Range Status   Specimen Description BLOOD RIGHT ANTECUBITAL   Final   Special Requests BOTTLES DRAWN AEROBIC AND ANAEROBIC 8CC EACH   Final   Culture NO GROWTH 4 DAYS   Final   Report Status PENDING   Incomplete  URINE CULTURE     Status: None   Collection Time    05/16/12  5:20 AM      Result Value Range Status   Specimen Description URINE, CLEAN CATCH   Final   Special Requests NONE   Final   Culture  Setup Time 05/16/2012 15:36   Final   Colony Count NO GROWTH   Final   Culture NO GROWTH   Final   Report Status 05/16/2012 FINAL   Final  CULTURE, RESPIRATORY (NON-EXPECTORATED)     Status: None   Collection Time    05/16/12  7:39 AM      Result Value Range Status   Specimen Description SPUTUM EXPECTORATED   Final   Special Requests NONE  Final   Gram Stain     Final   Value: RARE WBC PRESENT,BOTH PMN AND MONONUCLEAR     RARE SQUAMOUS EPITHELIAL CELLS PRESENT     FEW YEAST     FEW GRAM POSITIVE RODS     FEW GRAM POSITIVE COCCI IN PAIRS   Culture     Final   Value: MODERATE STAPHYLOCOCCUS AUREUS     Note: RIFAMPIN AND GENTAMICIN SHOULD NOT BE USED AS SINGLE DRUGS FOR TREATMENT OF STAPH INFECTIONS.   Report Status 05/19/2012 FINAL   Final   Organism ID, Bacteria STAPHYLOCOCCUS AUREUS   Final  AFB CULTURE WITH SMEAR     Status: None   Collection Time    05/17/12  7:33 AM      Result Value Range Status   Specimen Description ENDOTRACHEAL   Final   Special Requests Immunocompromised   Final   ACID FAST SMEAR NO ACID FAST BACILLI SEEN   Final   Culture     Final   Value: CULTURE WILL BE EXAMINED FOR 6 WEEKS BEFORE ISSUING A FINAL REPORT   Report Status PENDING   Incomplete  CULTURE, EXPECTORATED SPUTUM-ASSESSMENT      Status: None   Collection Time    05/18/12  6:35 AM      Result Value Range Status   Specimen Description SPUTUM   Final   Special Requests NONE   Final   Sputum evaluation     Final   Value: THIS SPECIMEN IS ACCEPTABLE. RESPIRATORY CULTURE REPORT TO FOLLOW.   Report Status 05/18/2012 FINAL   Final  CULTURE, RESPIRATORY (NON-EXPECTORATED)     Status: None   Collection Time    05/18/12  6:35 AM      Result Value Range Status   Specimen Description SPUTUM   Final   Special Requests NONE   Final   Gram Stain     Final   Value: MODERATE WBC PRESENT, PREDOMINANTLY PMN     RARE SQUAMOUS EPITHELIAL CELLS PRESENT     MODERATE GRAM NEGATIVE RODS     FEW GRAM POSITIVE RODS   Culture NORMAL OROPHARYNGEAL FLORA   Final   Report Status 05/20/2012 FINAL   Final    Time coordinating discharge: 40 minutes.  Signed:  Aleasha Fregeau  Pager [phone] [facility] 05/20/2012, 11:16 AM

## 2012-05-20 NOTE — Progress Notes (Signed)
Patient ID: Nicholas King, male   DOB: Jul 16, 1970, 42 y.o.   MRN: [id number]         [facility]    Date of Admission:  05/16/2012   Total days of antibiotics 6         Principal Problem:   Staph Aureus Pneumonia with Cavitary lesion of lung, fever and chest pain Active Problems:   Fever   Diffuse large B cell lymphoma   Other malignant lymphomas, unspecified site, extranodal and solid organ sites   Cachexia   Thrush, oral   Dysphagia, unspecified   Chest pain   Severe malnutrition in the context of chronic illness   GERD (gastroesophageal reflux disease)   Thyroid nodule   Anemia   Cigarette smoker   Underweight   Adverse drug reaction   Rash and nonspecific skin eruption   . clindamycin (CLEOCIN) IV  900 mg Intravenous Q8H  . feeding supplement  1 Container Oral TID BM  . fluconazole  100 mg Oral Daily  . lactose free nutrition  237 mL Oral QID  . multivitamin  5 mL Oral Daily  . nicotine  21 mg Transdermal Daily  . pantoprazole  40 mg Oral Daily  . vancomycin  750 mg Intravenous Q8H    Subjective: He is feeling much better.  Objective: Temp:  [98.1 F (36.7 C)-98.3 F (36.8 C)] 98.1 F (36.7 C) (04/15 0721) Pulse Rate:  [73-90] 90 (04/15 0721) Resp:  [16-18] 16 (04/15 0721) BP: (118-130)/(79-87) 118/85 mmHg (04/15 0721) SpO2:  [97 %-100 %] 100 % (04/15 0721) Weight:  [112 lb 8 oz (51.03 kg)] 112 lb 8 oz (51.03 kg) (04/15 0500)  Lab Results Lab Results  Component Value Date   WBC 4.4 05/17/2012   HGB 10.3* 05/17/2012   HCT 32.1* 05/17/2012   MCV 82.9 05/17/2012   PLT 260 05/17/2012    Lab Results  Component Value Date   CREATININE 0.68 05/17/2012   BUN 6 05/17/2012   NA 132* 05/17/2012   K 3.6 05/17/2012   CL 98 05/17/2012   CO2 26 05/17/2012    Lab Results  Component Value Date   ALT 13 05/17/2012   AST 19 05/17/2012   ALKPHOS 44 05/17/2012   BILITOT 0.4 05/17/2012      Microbiology: Recent Results (from the past 240  hour(s))  CULTURE, BLOOD (ROUTINE X 2)     Status: None   Collection Time    05/16/12  5:02 AM      Result Value Range Status   Specimen Description BLOOD RIGHT ANTECUBITAL   Final   Special Requests BOTTLES DRAWN AEROBIC AND ANAEROBIC 6CC EACH   Final   Culture NO GROWTH 4 DAYS   Final   Report Status PENDING   Incomplete  CULTURE, BLOOD (ROUTINE X 2)     Status: None   Collection Time    05/16/12  5:02 AM      Result Value Range Status   Specimen Description BLOOD RIGHT ANTECUBITAL   Final   Special Requests BOTTLES DRAWN AEROBIC AND ANAEROBIC 8CC EACH   Final   Culture NO GROWTH 4 DAYS   Final   Report Status PENDING   Incomplete  URINE CULTURE     Status: None   Collection Time    05/16/12  5:20 AM      Result Value Range Status   Specimen Description URINE, CLEAN CATCH   Final   Special Requests NONE  Final   Culture  Setup Time 05/16/2012 15:36   Final   Colony Count NO GROWTH   Final   Culture NO GROWTH   Final   Report Status 05/16/2012 FINAL   Final  CULTURE, RESPIRATORY (NON-EXPECTORATED)     Status: None   Collection Time    05/16/12  7:39 AM      Result Value Range Status   Specimen Description SPUTUM EXPECTORATED   Final   Special Requests NONE   Final   Gram Stain     Final   Value: RARE WBC PRESENT,BOTH PMN AND MONONUCLEAR     RARE SQUAMOUS EPITHELIAL CELLS PRESENT     FEW YEAST     FEW GRAM POSITIVE RODS     FEW GRAM POSITIVE COCCI IN PAIRS   Culture     Final   Value: MODERATE STAPHYLOCOCCUS AUREUS     Note: RIFAMPIN AND GENTAMICIN SHOULD NOT BE USED AS SINGLE DRUGS FOR TREATMENT OF STAPH INFECTIONS.   Report Status 05/19/2012 FINAL   Final   Organism ID, Bacteria STAPHYLOCOCCUS AUREUS   Final  AFB CULTURE WITH SMEAR     Status: None   Collection Time    05/17/12  7:33 AM      Result Value Range Status   Specimen Description ENDOTRACHEAL   Final   Special Requests Immunocompromised   Final   ACID FAST SMEAR NO ACID FAST BACILLI SEEN   Final    Culture     Final   Value: CULTURE WILL BE EXAMINED FOR 6 WEEKS BEFORE ISSUING A FINAL REPORT   Report Status PENDING   Incomplete  CULTURE, EXPECTORATED SPUTUM-ASSESSMENT     Status: None   Collection Time    05/18/12  6:35 AM      Result Value Range Status   Specimen Description SPUTUM   Final   Special Requests NONE   Final   Sputum evaluation     Final   Value: THIS SPECIMEN IS ACCEPTABLE. RESPIRATORY CULTURE REPORT TO FOLLOW.   Report Status 05/18/2012 FINAL   Final  CULTURE, RESPIRATORY (NON-EXPECTORATED)     Status: None   Collection Time    05/18/12  6:35 AM      Result Value Range Status   Specimen Description SPUTUM   Final   Special Requests NONE   Final   Gram Stain     Final   Value: MODERATE WBC PRESENT, PREDOMINANTLY PMN     RARE SQUAMOUS EPITHELIAL CELLS PRESENT     MODERATE GRAM NEGATIVE RODS     FEW GRAM POSITIVE RODS   Culture NORMAL OROPHARYNGEAL FLORA   Final   Report Status 05/20/2012 FINAL   Final    Studies/Results: US Thyroid Biopsy  05/19/2012  *RADIOLOGY REPORT*  Clinical Data:  42 year old male with a 2.5 cm mixed cystic and solid right thyroid nodule which meets consensus criteria for fine needle aspiration biopsy.  ULTRASOUND GUIDED NEEDLE ASPIRATE BIOPSY OF THE THYROID GLAND  Comparison: Thyroid ultrasound 05/16/2012  Thyroid biopsy was thoroughly discussed with the patient and questions were answered.  The benefits, risks, alternatives, and complications were also discussed.  The patient understands and wishes to proceed with the procedure.  Written consent was obtained.  Ultrasound was performed to localize and mark an adequate site for the biopsy.  The patient was then prepped and draped in a normal sterile fashion.  Local anesthesia was provided with 1% lidocaine. Using direct ultrasound guidance, for passes were made using 25  gauge needles into the nodule within the right lobe of the thyroid. Ultrasound was used to confirm needle placements on all  occasions. Specimens were sent to Pathology for analysis.  Complications:  None  Findings:  2.5 cm mixed cystic and solid thyroid nodule.  IMPRESSION:  Technically successful ultrasound guided needle aspirate biopsy performed of the right thyroid nodule.  Signed,  Sterling Big, MD Vascular & Interventional Radiologist [facility]   Original Report Authenticated By: Malachy Moan, M.D.     Assessment: He has grown methicillin sensitive staph aureus from sputum. I suspect that this is the culprit for his acute cavitary pneumonia. I will discharge him on oral cephalexin and follow him up in clinic next week. He will probably need several weeks of total antibiotic therapy to achieve cure.  Plan: 1. Agree with discharge today on oral cephalexin 2. I will arrange followup in our clinic on Thursday, April 24  Cliffton Asters, MD [facility] [phone] pager   [phone] cell 05/20/2012, 12:34 PM

## 2012-05-21 ENCOUNTER — Other Ambulatory Visit (HOSPITAL_COMMUNITY): Payer: Self-pay

## 2012-05-21 ENCOUNTER — Encounter: Payer: Self-pay | Admitting: Oncology

## 2012-05-21 ENCOUNTER — Other Ambulatory Visit: Payer: Self-pay | Admitting: *Deleted

## 2012-05-21 LAB — CULTURE, BLOOD (ROUTINE X 2): Culture: NO GROWTH

## 2012-05-21 NOTE — Progress Notes (Signed)
Processed EPP- 100% ind 05/21/12-11/20/12. I will send the letter and card to patient. All documents are scanned in.

## 2012-05-22 ENCOUNTER — Other Ambulatory Visit: Payer: Self-pay | Admitting: *Deleted

## 2012-05-22 ENCOUNTER — Other Ambulatory Visit: Payer: Self-pay | Admitting: Radiology

## 2012-05-23 ENCOUNTER — Telehealth: Payer: Self-pay | Admitting: *Deleted

## 2012-05-23 ENCOUNTER — Ambulatory Visit (HOSPITAL_COMMUNITY)
Admission: RE | Admit: 2012-05-23 | Discharge: 2012-05-23 | Disposition: A | Discharge status: Home / Self Care | Payer: Medicaid Other | Source: Ambulatory Visit | Attending: Oncology | Admitting: Oncology

## 2012-05-23 ENCOUNTER — Encounter (HOSPITAL_COMMUNITY): Payer: Self-pay

## 2012-05-23 DIAGNOSIS — J36 Peritonsillar abscess: Secondary | ICD-10-CM

## 2012-05-23 DIAGNOSIS — C8330 Diffuse large B-cell lymphoma, unspecified site: Secondary | ICD-10-CM

## 2012-05-23 DIAGNOSIS — C8589 Other specified types of non-Hodgkin lymphoma, extranodal and solid organ sites: Secondary | ICD-10-CM | POA: Insufficient documentation

## 2012-05-23 DIAGNOSIS — Z538 Procedure and treatment not carried out for other reasons: Secondary | ICD-10-CM | POA: Insufficient documentation

## 2012-05-23 DIAGNOSIS — K1379 Other lesions of oral mucosa: Secondary | ICD-10-CM

## 2012-05-23 DIAGNOSIS — R509 Fever, unspecified: Secondary | ICD-10-CM | POA: Insufficient documentation

## 2012-05-23 HISTORY — DX: Pneumonia, unspecified organism: J18.9

## 2012-05-23 LAB — CBC WITH DIFFERENTIAL/PLATELET
Basophils Absolute: 0 10*3/uL (ref 0.0–0.1)
Basophils Relative: 0 % (ref 0–1)
Eosinophils Absolute: 0 10*3/uL (ref 0.0–0.7)
Eosinophils Relative: 0 % (ref 0–5)
HCT: 39.6 % (ref 39.0–52.0)
MCH: 26.3 pg (ref 26.0–34.0)
MCHC: 32.1 g/dL (ref 30.0–36.0)
MCV: 82.2 fL (ref 78.0–100.0)
Monocytes Absolute: 0.6 10*3/uL (ref 0.1–1.0)
RDW: 15.3 % (ref 11.5–15.5)

## 2012-05-23 MED ORDER — SODIUM CHLORIDE 0.9 % IV SOLN
INTRAVENOUS | Status: DC
  Administered 2012-05-23: 20 mL/h via INTRAVENOUS

## 2012-05-23 NOTE — Telephone Encounter (Signed)
Called pt's home and s/w mother.  Pt was not home.   Wanted to make sure pt aware that he does not have appts here on Monday 4/21 to see Dr. Gaylyn Rong and chemo as originally scheduled.  Chemo and office visit will be delayed by 2 weeks to give pt a chance to recover from his lung abscess per Dr. Gaylyn Rong.  Mother verbalized understanding and asked that I try pt's cell phone,  But she will relay message to pt as well.  Called cell phone and no answer,  Mail box full.

## 2012-05-23 NOTE — Progress Notes (Signed)
Pt presented for port placement. Recently diagnosed with lymphoma but was hospitalized with pneumonia. Has been on PO antibiotics since discharge and states he is feeling ok, but upon vitals check in short stay, he is found to be febrile at 101. Labs reviewed, WBC ok. D/W Dr. Fredia Sorrow and then called Dr. Gaylyn Rong, who requests we delay his port placement for approx 10 days. He will follow up with ID next week and hopefully any infectious issues will be resolved.  I discussed port procedure with the pt and his family, who completely understand and are agreeable with plan to delay port/treatment.  He is rescheduled for 4/30.

## 2012-05-25 ENCOUNTER — Other Ambulatory Visit: Payer: Self-pay

## 2012-05-25 ENCOUNTER — Emergency Department (HOSPITAL_COMMUNITY): Payer: Medicaid Other

## 2012-05-25 ENCOUNTER — Emergency Department (HOSPITAL_COMMUNITY)
Admission: EM | Admit: 2012-05-25 | Discharge: 2012-05-25 | Disposition: A | Discharge status: Home / Self Care | Payer: Medicaid Other | Attending: Emergency Medicine | Admitting: Emergency Medicine

## 2012-05-25 ENCOUNTER — Encounter (HOSPITAL_COMMUNITY): Payer: Self-pay | Admitting: Emergency Medicine

## 2012-05-25 DIAGNOSIS — Z8719 Personal history of other diseases of the digestive system: Secondary | ICD-10-CM | POA: Insufficient documentation

## 2012-05-25 DIAGNOSIS — R55 Syncope and collapse: Secondary | ICD-10-CM

## 2012-05-25 DIAGNOSIS — R51 Headache: Secondary | ICD-10-CM | POA: Insufficient documentation

## 2012-05-25 DIAGNOSIS — F17200 Nicotine dependence, unspecified, uncomplicated: Secondary | ICD-10-CM | POA: Insufficient documentation

## 2012-05-25 DIAGNOSIS — Z880 Allergy status to penicillin: Secondary | ICD-10-CM | POA: Insufficient documentation

## 2012-05-25 DIAGNOSIS — Z79899 Other long term (current) drug therapy: Secondary | ICD-10-CM | POA: Insufficient documentation

## 2012-05-25 DIAGNOSIS — J984 Other disorders of lung: Secondary | ICD-10-CM

## 2012-05-25 DIAGNOSIS — R509 Fever, unspecified: Secondary | ICD-10-CM | POA: Insufficient documentation

## 2012-05-25 DIAGNOSIS — R079 Chest pain, unspecified: Secondary | ICD-10-CM | POA: Insufficient documentation

## 2012-05-25 DIAGNOSIS — R42 Dizziness and giddiness: Secondary | ICD-10-CM | POA: Insufficient documentation

## 2012-05-25 DIAGNOSIS — C8590 Non-Hodgkin lymphoma, unspecified, unspecified site: Secondary | ICD-10-CM

## 2012-05-25 DIAGNOSIS — J15211 Pneumonia due to Methicillin susceptible Staphylococcus aureus: Secondary | ICD-10-CM | POA: Insufficient documentation

## 2012-05-25 DIAGNOSIS — C8589 Other specified types of non-Hodgkin lymphoma, extranodal and solid organ sites: Secondary | ICD-10-CM | POA: Insufficient documentation

## 2012-05-25 DIAGNOSIS — Z8589 Personal history of malignant neoplasm of other organs and systems: Secondary | ICD-10-CM | POA: Insufficient documentation

## 2012-05-25 LAB — BASIC METABOLIC PANEL
Chloride: 101 mEq/L (ref 96–112)
Creatinine, Ser: 0.67 mg/dL (ref 0.50–1.35)
GFR calc Af Amer: >90 mL/min (ref >90)
GFR calc non Af Amer: >90 mL/min (ref >90)
Potassium: 4.3 mEq/L (ref 3.5–5.1)

## 2012-05-25 LAB — CBC WITH DIFFERENTIAL/PLATELET
Basophils Absolute: 0 10*3/uL (ref 0.0–0.1)
Basophils Relative: 0 % (ref 0–1)
MCHC: 32.9 g/dL (ref 30.0–36.0)
Monocytes Absolute: 0.5 10*3/uL (ref 0.1–1.0)
Neutro Abs: 3.2 10*3/uL (ref 1.7–7.7)
Neutrophils Relative %: 68 % (ref 43–77)
RDW: 15.5 % (ref 11.5–15.5)

## 2012-05-25 MED ORDER — SODIUM CHLORIDE 0.9 % IV BOLUS (SEPSIS)
1000.0000 mL | Freq: Once | INTRAVENOUS | Status: AC
  Administered 2012-05-25: 1000 mL via INTRAVENOUS

## 2012-05-25 NOTE — ED Notes (Signed)
Pt stood to be weighed, no complaints of dizziness. Talking and alert, eating am meal.

## 2012-05-25 NOTE — ED Provider Notes (Signed)
History     CSN: [id number]  Arrival date & time 05/25/12  0435   First MD Initiated Contact with Patient 05/25/12 0510      Chief Complaint  Patient presents with  . Loss of Consciousness    (Consider location/radiation/quality/duration/timing/severity/associated sxs/prior treatment) HPI Nicholas King is a 42 y.o. male  With a h/o weight loss, biopsy proven large B cell lymphoma, staph aureus pneumonia on antibiotics,   brought in by ambulance, who presents to the Emergency Department complaining of LOC while staying at a motel here in town.  He apparently got up out of bed and passed out on the floor.He does not recall the episode. He is c/o dizziness and a headache. His girlfriend woke to see him on the ground. He was at [facility] yesterday for possible port a cath placement. He is still running fevers from the pneumonia and the placement was postponed until 06/04/12. He is to have chemotherapy after he had gained weight up to 100 pounds. He has an appointment with Dr. Orvan Falconer, ID next week.   Oncology Dr. Gaylyn Rong Past Medical History  Diagnosis Date  . GERD (gastroesophageal reflux disease)   . Cancer   . Lymphoma   . Dysphagia, unspecified 05/16/2012  . Pneumonia     April 2014    Past Surgical History  Procedure Laterality Date  . No past surgeries    . Direct laryngoscopy Left 05/08/2012    Procedure: DIRECT LARYNGOSCOPY with biopsy left tonsil;  Surgeon: Suzanna Obey, MD;  Location: [facility] OR;  Service: ENT;  Laterality: Left;  . Right eyebrown bone      Family History  Problem Relation Age of Onset  . Cancer Father     lung  . Diabetes Father   . Cancer Maternal Aunt     gyn cancer, pancreas  . Cancer Paternal Aunt     pancreas cancer  . Cancer Paternal Uncle     unk    History  Substance Use Topics  . Smoking status: Current Every Day Smoker -- 1.50 packs/day for 25 years    Types: Cigarettes  . Smokeless tobacco: Not on file  . Alcohol Use: No      Review  of Systems  Constitutional: Negative for fever.       10 Systems reviewed and are negative for acute change except as noted in the HPI.  HENT: Negative for congestion.   Eyes: Negative for discharge and redness.  Respiratory: Negative for cough and shortness of breath.   Cardiovascular: Negative for chest pain.  Gastrointestinal: Negative for vomiting and abdominal pain.  Musculoskeletal: Negative for back pain.  Skin: Negative for rash.  Neurological: Positive for dizziness and headaches. Negative for syncope and numbness.  Psychiatric/Behavioral:       No behavior change.    Allergies  Penicillins; Amoxicillin; and Hydrocodone  Home Medications   Current Outpatient Rx  Name  Route  Sig  Dispense  Refill  . cephALEXin (KEFLEX) 500 MG capsule   Oral   Take 500 mg by mouth 4 (four) times daily.         . fluconazole (DIFLUCAN) 100 MG tablet   Oral   Take 100 mg by mouth once a week. Taken on Wednesdays.         Marland Kitchen morphine (MS CONTIN) 15 MG 12 hr tablet   Oral   Take 1 tablet (15 mg total) by mouth 2 (two) times daily.   60 tablet   0   .  oxyCODONE-acetaminophen (PERCOCET) 7.5-325 MG per tablet   Oral   Take 1 tablet by mouth every 6 (six) hours as needed for pain.   90 tablet   0   . prochlorperazine (COMPAZINE) 10 MG tablet   Oral   Take 1 tablet (10 mg total) by mouth every 6 (six) hours as needed (Nausea or vomiting).   30 tablet   3   . Alum & Mag Hydroxide-Simeth (MAGIC MOUTHWASH W/LIDOCAINE) SOLN   Oral   Take 15 mLs by mouth 4 (four) times daily as needed (for mouth pain.).         Marland Kitchen lidocaine-prilocaine (EMLA) cream   Topical   Apply 1 application topically as needed (for port-a-cath access.).         Marland Kitchen ondansetron (ZOFRAN) 8 MG tablet   Oral   Take 8 mg by mouth every 8 (eight) hours as needed for nausea.         . predniSONE (DELTASONE) 20 MG tablet   Oral   Take 3 tablets (60 mg total) by mouth daily. Take on days 1-5 of  chemotherapy.   45 tablet   0   . sulfamethoxazole-trimethoprim (BACTRIM DS) 800-160 MG per tablet   Oral   Take 1 tablet by mouth 2 (two) times daily. 21 day course of therapy started 05/20/12.           BP 95/63  Pulse 69  Temp(Src) 97.9 F (36.6 C) (Oral)  Resp 12  Ht 5\' 7"  (1.702 m)  Wt 98 lb (44.453 kg)  BMI 15.35 kg/m2  SpO2 100%  Physical Exam  Nursing note and vitals reviewed. Constitutional:  Thin, cachectic gentleman. Awake, alert.  HENT:  Head: Normocephalic.  Right Ear: External ear normal.  Left Ear: External ear normal.  Mouth/Throat: Oropharynx is clear and moist.  Eyes: EOM are normal. Pupils are equal, round, and reactive to light.  Neck: Neck supple.  Cardiovascular: Normal rate and intact distal pulses.   Pulmonary/Chest: Effort normal. He has no wheezes. He has rales. He exhibits no tenderness.  Abdominal: Soft. There is no tenderness. There is no rebound.  Musculoskeletal: He exhibits no tenderness.  Baseline ROM, no obvious new focal weakness.  Neurological:  Mental status and motor strength appears baseline for patient and situation.  Skin: No rash noted.  Psychiatric: He has a normal mood and affect.    ED Course  Procedures (including critical care time) Results for orders placed during the hospital encounter of 05/25/12  CBC WITH DIFFERENTIAL      Result Value Range   WBC 4.7  4.0 - 10.5 K/uL   RBC 3.51 (*) 4.22 - 5.81 MIL/uL   Hemoglobin 9.6 (*) 13.0 - 17.0 g/dL   HCT 47.8 (*) 29.5 - 62.1 %   MCV 83.2  78.0 - 100.0 fL   MCH 27.4  26.0 - 34.0 pg   MCHC 32.9  30.0 - 36.0 g/dL   RDW 30.8  65.7 - 84.6 %   Platelets 287  150 - 400 K/uL   Neutrophils Relative 68  43 - 77 %   Neutro Abs 3.2  1.7 - 7.7 K/uL   Lymphocytes Relative 21  12 - 46 %   Lymphs Abs 1.0  0.7 - 4.0 K/uL   Monocytes Relative 11  3 - 12 %   Monocytes Absolute 0.5  0.1 - 1.0 K/uL   Eosinophils Relative 0  0 - 5 %   Eosinophils Absolute 0.0  0.0 - 0.7  K/uL    Basophils Relative 0  0 - 1 %   Basophils Absolute 0.0  0.0 - 0.1 K/uL  BASIC METABOLIC PANEL      Result Value Range   Sodium 136  135 - 145 mEq/L   Potassium 4.3  3.5 - 5.1 mEq/L   Chloride 101  96 - 112 mEq/L   CO2 32  19 - 32 mEq/L   Glucose, Bld 101 (*) 70 - 99 mg/dL   BUN 5 (*) 6 - 23 mg/dL   Creatinine, Ser 4.09  0.50 - 1.35 mg/dL   Calcium 7.7 (*) 8.4 - 10.5 mg/dL   GFR calc non Af Amer >90  >90 mL/min   GFR calc Af Amer >90  >90 mL/min     Date: 05/25/2012   0449  Rate: 45  Rhythm: sinus bradycardia  QRS Axis: normal  Intervals: normal  ST/T Wave abnormalities: nonspecific T wave changes  Conduction Disutrbances:none  Narrative Interpretation:   Old EKG Reviewed: changes noted c/q 05/16/12 rate is slower  Dg Chest 1 View  05/25/2012  *RADIOLOGY REPORT*  Clinical Data:  Altered mental status  CHEST - 1 VIEW  Comparison: Most recent prior chest x-ray and chest CT 05/16/2012  Findings: Stable position of cavitary left upper lobe pulmonary mass.  Lungs are negative for new focal airspace consolidation, pneumothorax or large pleural effusion.  No pulmonary edema. Cardiac and mediastinal contours are within normal limits.  No acute osseous abnormality.  IMPRESSION: No acute cardiopulmonary process.  Stable cavitary left upper lobe mass.   Original Report Authenticated By: Malachy Moan, M.D.    Ct Head Wo Contrast  05/25/2012  *RADIOLOGY REPORT*  Clinical Data:  Syncope, fall, headache, bilateral neck pain, recently diagnosed with lymphoma  CT HEAD WITHOUT CONTRAST CT CERVICAL SPINE WITHOUT CONTRAST  Technique:  Multidetector CT imaging of the head and cervical spine was performed following the standard protocol without intravenous contrast.  Multiplanar CT image reconstructions of the cervical spine were also generated.  Comparison:  CT neck dated 04/17/2012.  CT HEAD  Findings: No evidence of parenchymal hemorrhage or extra-axial fluid collection. No mass lesion, mass effect, or  midline shift.  No CT evidence of acute infarction.  Cerebral volume is age appropriate.  No ventriculomegaly.  Leftward nasal septal deviation.  Visualized paranasal sinuses are essentially clear.  Opacification of the right mastoid air cells. Partial opacification of the left mastoid air cells.  No evidence of calvarial fracture.  IMPRESSION: No evidence of acute intracranial abnormality.  Mastoid effusions, right greater than left.  CT CERVICAL SPINE  Findings: Normal cervical lordosis.  No evidence of fracture or dislocation.  The vertebral body heights and intervertebral disc spaces are maintained.  The dens appears intact.  No prevertebral soft tissue swelling.  Mild degenerative changes at C6-7.  Enlargement of the left palatine tonsil (series 5/image 49) with multiple small bilateral cervical lymph nodes, likely related to history of lymphoma.  Visualized thyroid is unremarkable.  Visualized lung apices are clear.  IMPRESSION:  No evidence of traumatic injury to the cervical spine.  Enlargement of the left palatine tonsil with bilateral cervical lymphadenopathy, likely related to known lymphoma.   Original Report Authenticated By: Charline Bills, M.D.    Ct Cervical Spine Wo Contrast  05/25/2012  *RADIOLOGY REPORT*  Clinical Data:  Syncope, fall, headache, bilateral neck pain, recently diagnosed with lymphoma  CT HEAD WITHOUT CONTRAST CT CERVICAL SPINE WITHOUT CONTRAST  Technique:  Multidetector CT imaging of the head  and cervical spine was performed following the standard protocol without intravenous contrast.  Multiplanar CT image reconstructions of the cervical spine were also generated.  Comparison:  CT neck dated 04/17/2012.  CT HEAD  Findings: No evidence of parenchymal hemorrhage or extra-axial fluid collection. No mass lesion, mass effect, or midline shift.  No CT evidence of acute infarction.  Cerebral volume is age appropriate.  No ventriculomegaly.  Leftward nasal septal deviation.   Visualized paranasal sinuses are essentially clear.  Opacification of the right mastoid air cells. Partial opacification of the left mastoid air cells.  No evidence of calvarial fracture.  IMPRESSION: No evidence of acute intracranial abnormality.  Mastoid effusions, right greater than left.  CT CERVICAL SPINE  Findings: Normal cervical lordosis.  No evidence of fracture or dislocation.  The vertebral body heights and intervertebral disc spaces are maintained.  The dens appears intact.  No prevertebral soft tissue swelling.  Mild degenerative changes at C6-7.  Enlargement of the left palatine tonsil (series 5/image 49) with multiple small bilateral cervical lymph nodes, likely related to history of lymphoma.  Visualized thyroid is unremarkable.  Visualized lung apices are clear.  IMPRESSION:  No evidence of traumatic injury to the cervical spine.  Enlargement of the left palatine tonsil with bilateral cervical lymphadenopathy, likely related to known lymphoma.   Original Report Authenticated By: Charline Bills, M.D.     MDM  Patient with lymphoma, pneumonia, who had a syncopal episode. Labs are normal. Orthostatics were negative. He has received IVF, eaten a meal. CT of head and cervical spine with no acute injuries. Chest xray with continued stable cavitary lesion. He is currently on antibiotics. He is to follow up with ID next week and with oncology on 06/04/12. The patient appears reasonably screened and/or stabilized for discharge and I doubt any other medical condition or other [facility] requiring further screening, evaluation, or treatment in the ED at this time prior to discharge.  MDM Reviewed: nursing note and vitals Interpretation: labs, ECG, CT scan and x-ray          Nicoletta Dress. Colon Branch, MD 05/25/12 1610

## 2012-05-25 NOTE — ED Notes (Signed)
Patient's girlfriend reports patient got up to go to bathroom and fell out. Patient reports does not remember passing out, reports woke up lying on floor and did not recall what happened. Also reports dizziness and headache.

## 2012-05-26 ENCOUNTER — Telehealth: Payer: Self-pay | Admitting: Oncology

## 2012-05-26 ENCOUNTER — Ambulatory Visit: Payer: Self-pay

## 2012-05-26 ENCOUNTER — Encounter: Payer: Self-pay | Admitting: Nutrition

## 2012-05-26 ENCOUNTER — Ambulatory Visit: Payer: Self-pay | Admitting: Oncology

## 2012-05-26 NOTE — Telephone Encounter (Signed)
appts already changed per 4/18 pof. S/w pt today confirming next appt for 5/5.

## 2012-05-27 ENCOUNTER — Ambulatory Visit: Payer: Self-pay

## 2012-05-27 ENCOUNTER — Telehealth: Payer: Self-pay | Admitting: *Deleted

## 2012-05-27 NOTE — Telephone Encounter (Signed)
Call from Marciano Sequin to schedule appt w/ Dr. Gaylyn Rong for next week per pt's hospital d/c instructions.   Informed her appt already made to see dr. Gaylyn Rong on 5/05 and start chemo same day if everything goes well.  She states pt missed his chemo education appt d/t being in hospital.  Informed her I will send a new order to schedule the chemo education prior to 06/09/12.  She will come by scheduling tomorrow while pt having PET scan to get schedule.

## 2012-05-28 ENCOUNTER — Encounter (HOSPITAL_COMMUNITY)
Admission: RE | Admit: 2012-05-28 | Discharge: 2012-05-28 | Disposition: A | Discharge status: Home / Self Care | Payer: Medicaid Other | Source: Ambulatory Visit | Attending: Oncology | Admitting: Oncology

## 2012-05-28 ENCOUNTER — Telehealth: Payer: Self-pay | Admitting: Oncology

## 2012-05-28 DIAGNOSIS — J479 Bronchiectasis, uncomplicated: Secondary | ICD-10-CM | POA: Insufficient documentation

## 2012-05-28 DIAGNOSIS — C8330 Diffuse large B-cell lymphoma, unspecified site: Secondary | ICD-10-CM

## 2012-05-28 DIAGNOSIS — K1379 Other lesions of oral mucosa: Secondary | ICD-10-CM

## 2012-05-28 DIAGNOSIS — C8589 Other specified types of non-Hodgkin lymphoma, extranodal and solid organ sites: Secondary | ICD-10-CM | POA: Insufficient documentation

## 2012-05-28 DIAGNOSIS — J36 Peritonsillar abscess: Secondary | ICD-10-CM

## 2012-05-28 DIAGNOSIS — R599 Enlarged lymph nodes, unspecified: Secondary | ICD-10-CM | POA: Insufficient documentation

## 2012-05-28 LAB — GLUCOSE, CAPILLARY: Glucose-Capillary: 91 mg/dL (ref 70–99)

## 2012-05-28 MED ORDER — FLUDEOXYGLUCOSE F - 18 (FDG) INJECTION
19.3000 | Freq: Once | INTRAVENOUS | Status: AC | PRN
  Administered 2012-05-28: 19.3 via INTRAVENOUS

## 2012-05-29 ENCOUNTER — Telehealth: Payer: Self-pay | Admitting: *Deleted

## 2012-05-29 ENCOUNTER — Other Ambulatory Visit: Payer: Self-pay | Admitting: Radiology

## 2012-05-29 ENCOUNTER — Encounter: Payer: Self-pay | Admitting: Internal Medicine

## 2012-05-29 ENCOUNTER — Ambulatory Visit (INDEPENDENT_AMBULATORY_CARE_PROVIDER_SITE_OTHER): Payer: Self-pay | Admitting: Internal Medicine

## 2012-05-29 VITALS — BP 142/95 | HR 99 | Temp 98.8°F | Ht 67.0 in | Wt 97.2 lb

## 2012-05-29 DIAGNOSIS — J984 Other disorders of lung: Secondary | ICD-10-CM

## 2012-05-29 NOTE — Telephone Encounter (Signed)
Per Dr. Gaylyn Rong,  PET scan did not give clear results for staging.  Pt will need to proceed with Bone Marrow Biopsy as scheduled to help determine Stage of Lymphoma.  Instructed pt to keep all appts as scheduled next week for BMBx,  Chemo Ed class and Porta cath placement.  Return to [facility] for lab/office visit/ chemo on 5/05 as scheduled.   Pt  Verbalized understanding.

## 2012-05-29 NOTE — Progress Notes (Signed)
Patient ID: Nicholas King, male   DOB: 1970/07/31, 42 y.o.   MRN: [id number]         [facility] Infectious Disease  Patient Active Problem List  Diagnosis  . Other malignant lymphomas, unspecified site, extranodal and solid organ sites  . Fever  . Staph Aureus Pneumonia with Cavitary lesion of lung, fever and chest pain  . Cachexia  . Diffuse large B cell lymphoma  . Thrush, oral  . Dysphagia, unspecified  . Chest pain  . Severe malnutrition in the context of chronic illness  . GERD (gastroesophageal reflux disease)  . Thyroid nodule  . Anemia  . Hyperglycemia  . Cigarette smoker  . Underweight  . Adverse drug reaction  . Rash and nonspecific skin eruption    Patient's Medications  New Prescriptions   No medications on file  Previous Medications   ALUM & MAG HYDROXIDE-SIMETH (MAGIC MOUTHWASH W/LIDOCAINE) SOLN    Take 15 mLs by mouth 4 (four) times daily as needed (for mouth pain.).   CEPHALEXIN (KEFLEX) 500 MG CAPSULE    Take 500 mg by mouth 4 (four) times daily.   FLUCONAZOLE (DIFLUCAN) 100 MG TABLET    Take 100 mg by mouth once a week. Taken on Wednesdays.   LIDOCAINE-PRILOCAINE (EMLA) CREAM    Apply 1 application topically as needed (for port-a-cath access.).   MORPHINE (MS CONTIN) 15 MG 12 HR TABLET    Take 1 tablet (15 mg total) by mouth 2 (two) times daily.   ONDANSETRON (ZOFRAN) 8 MG TABLET    Take 8 mg by mouth every 8 (eight) hours as needed for nausea.   OXYCODONE-ACETAMINOPHEN (PERCOCET) 7.5-325 MG PER TABLET    Take 1 tablet by mouth every 6 (six) hours as needed for pain.   PREDNISONE (DELTASONE) 20 MG TABLET    Take 3 tablets (60 mg total) by mouth daily. Take on days 1-5 of chemotherapy.   PROCHLORPERAZINE (COMPAZINE) 10 MG TABLET    Take 1 tablet (10 mg total) by mouth every 6 (six) hours as needed (Nausea or vomiting).   SULFAMETHOXAZOLE-TRIMETHOPRIM (BACTRIM DS) 800-160 MG PER TABLET    Take 1 tablet by mouth 2 (two) times daily. 21 day course of  therapy started 05/20/12.  Modified Medications   No medications on file  Discontinued Medications   No medications on file    Subjective: Nicholas King is in for his hospital followup visit. 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 He responded promptly to empiric IV antibiotic therapy and I converted him to oral cephalexin which she has now been taking for 2 weeks. He had no evidence of tuberculosis or other infections when he was hospitalized in his HIV antibody was negative. He is feeling markedly better. He's had no further left-sided chest pain, cough, sputum production, shortness of breath or fever. He has had no problems tolerating his cephalexin.  Objective: Temp: 98.8 F (37.1 C) (04/24 1030) Temp src: Oral (04/24 1030) BP: 142/95 mmHg (04/24 1030) Pulse Rate: 99 (04/24 1030)  General:  He looks much improved Skin: No rash Lungs: Clear Cor: Regular S1 and S2 no murmurs   Assessment: He is much improved over the past 2 weeks on therapy for cavitary staph aureus pneumonia. He had a chest x-ray repeated 4 days ago which shows improvement in the left upper lobe infiltrating cavity. I will plan on completing  2 more weeks of therapy with cephalexin and then repeating another chest x-ray.  Plan: 1. Continue cephalexin for 2 more weeks 2. Followup on May 8   Cliffton Asters, MD [facility] for Infectious Disease [facility] [phone] pager   [phone] cell 05/29/2012, 11:12 AM

## 2012-05-29 NOTE — Telephone Encounter (Signed)
Pt requesting results of his PET scan from yesterday.  Asking if he still needs to keep appt for Bone Marrow Biopsy as scheduled?

## 2012-05-30 ENCOUNTER — Encounter (HOSPITAL_COMMUNITY): Payer: Self-pay | Admitting: Pharmacy Technician

## 2012-05-30 ENCOUNTER — Other Ambulatory Visit: Payer: Self-pay | Admitting: Radiology

## 2012-06-02 ENCOUNTER — Encounter (HOSPITAL_COMMUNITY): Payer: Self-pay

## 2012-06-02 ENCOUNTER — Ambulatory Visit (HOSPITAL_COMMUNITY)
Admission: RE | Admit: 2012-06-02 | Discharge: 2012-06-02 | Disposition: A | Discharge status: Home / Self Care | Payer: Medicaid Other | Source: Ambulatory Visit | Attending: Oncology | Admitting: Oncology

## 2012-06-02 DIAGNOSIS — R64 Cachexia: Secondary | ICD-10-CM | POA: Insufficient documentation

## 2012-06-02 DIAGNOSIS — C8330 Diffuse large B-cell lymphoma, unspecified site: Secondary | ICD-10-CM

## 2012-06-02 DIAGNOSIS — R5381 Other malaise: Secondary | ICD-10-CM | POA: Insufficient documentation

## 2012-06-02 DIAGNOSIS — K1379 Other lesions of oral mucosa: Secondary | ICD-10-CM

## 2012-06-02 DIAGNOSIS — R634 Abnormal weight loss: Secondary | ICD-10-CM | POA: Insufficient documentation

## 2012-06-02 DIAGNOSIS — C8589 Other specified types of non-Hodgkin lymphoma, extranodal and solid organ sites: Secondary | ICD-10-CM | POA: Insufficient documentation

## 2012-06-02 DIAGNOSIS — J36 Peritonsillar abscess: Secondary | ICD-10-CM

## 2012-06-02 DIAGNOSIS — R5383 Other fatigue: Secondary | ICD-10-CM | POA: Insufficient documentation

## 2012-06-02 LAB — CBC
HCT: 34 % — ABNORMAL LOW (ref 39.0–52.0)
MCH: 26.1 pg (ref 26.0–34.0)
MCV: 79.8 fL (ref 78.0–100.0)
Platelets: 385 10*3/uL (ref 150–400)
RDW: 14.9 % (ref 11.5–15.5)
WBC: 7.5 10*3/uL (ref 4.0–10.5)

## 2012-06-02 LAB — BONE MARROW EXAM: Bone Marrow Exam: 266

## 2012-06-02 MED ORDER — FENTANYL CITRATE 0.05 MG/ML IJ SOLN
INTRAMUSCULAR | Status: AC
  Filled 2012-06-02: qty 6

## 2012-06-02 MED ORDER — SODIUM CHLORIDE 0.9 % IV SOLN
Freq: Once | INTRAVENOUS | Status: AC
  Administered 2012-06-02: 08:00:00 via INTRAVENOUS

## 2012-06-02 MED ORDER — FENTANYL CITRATE 0.05 MG/ML IJ SOLN
INTRAMUSCULAR | Status: AC | PRN
  Administered 2012-06-02: 25 ug via INTRAVENOUS

## 2012-06-02 MED ORDER — MIDAZOLAM HCL 2 MG/2ML IJ SOLN
INTRAMUSCULAR | Status: AC
  Filled 2012-06-02: qty 6

## 2012-06-02 MED ORDER — MIDAZOLAM HCL 2 MG/2ML IJ SOLN
INTRAMUSCULAR | Status: AC | PRN
  Administered 2012-06-02: 0.5 mg via INTRAVENOUS

## 2012-06-02 NOTE — Procedures (Signed)
Technically successful CT guided bone marrow aspiration and biopsy of left iliac crest. No immediate complications. Awaiting pathology report.    

## 2012-06-02 NOTE — H&P (Signed)
Nicholas King is an 42 y.o. male.   Chief Complaint: "I'm having a bone marrow biopsy" HPI: Patient with history of large B cell lymphoma presents today for CT guided bone marrow biopsy for staging.  Past Medical History  Diagnosis Date  . GERD (gastroesophageal reflux disease)   . Cancer   . Lymphoma   . Dysphagia, unspecified 05/16/2012  . Pneumonia     April 2014    Past Surgical History  Procedure Laterality Date  . No past surgeries    . Direct laryngoscopy Left 05/08/2012    Procedure: DIRECT LARYNGOSCOPY with biopsy left tonsil;  Surgeon: Suzanna Obey, MD;  Location: [facility] OR;  Service: ENT;  Laterality: Left;  . Right eyebrown bone      Family History  Problem Relation Age of Onset  . Cancer Father     lung  . Diabetes Father   . Cancer Maternal Aunt     gyn cancer, pancreas  . Cancer Paternal Aunt     pancreas cancer  . Cancer Paternal Uncle     unk   Social History:  reports that he has been smoking Cigarettes.  He has a 12.5 pack-year smoking history. He does not have any smokeless tobacco history on file. He reports that he uses illicit drugs (Marijuana). He reports that he does not drink alcohol.  Allergies:  Allergies  Allergen Reactions  . Penicillins Hives  . Amoxicillin Rash  . Hydrocodone Rash    Current outpatient prescriptions:cephALEXin (KEFLEX) 500 MG capsule, Take 500 mg by mouth 4 (four) times daily., Disp: , Rfl: ;  morphine (MS CONTIN) 15 MG 12 hr tablet, Take 1 tablet (15 mg total) by mouth 2 (two) times daily., Disp: 60 tablet, Rfl: 0;  oxyCODONE-acetaminophen (PERCOCET) 7.5-325 MG per tablet, Take 1 tablet by mouth every 6 (six) hours as needed for pain., Disp: 90 tablet, Rfl: 0 prochlorperazine (COMPAZINE) 10 MG tablet, Take 1 tablet (10 mg total) by mouth every 6 (six) hours as needed (Nausea or vomiting)., Disp: 30 tablet, Rfl: 3;  Alum & Mag Hydroxide-Simeth (MAGIC MOUTHWASH W/LIDOCAINE) SOLN, Take 15 mLs by mouth 4 (four) times daily as needed  (for mouth pain.)., Disp: , Rfl: ;  fluconazole (DIFLUCAN) 100 MG tablet, Take 100 mg by mouth once a week. Taken on Wednesdays., Disp: , Rfl:  lidocaine-prilocaine (EMLA) cream, Apply 1 application topically as needed (for port-a-cath access.)., Disp: , Rfl: ;  ondansetron (ZOFRAN) 8 MG tablet, Take 8 mg by mouth every 8 (eight) hours as needed for nausea., Disp: , Rfl: ;  predniSONE (DELTASONE) 20 MG tablet, Take 3 tablets (60 mg total) by mouth daily. Take on days 1-5 of chemotherapy., Disp: 45 tablet, Rfl: 0   Results for orders placed during the hospital encounter of 06/02/12 (from the past 48 hour(s))  APTT     Status: Abnormal   Collection Time    06/02/12  7:30 AM      Result Value Range   aPTT 40 (*) 24 - 37 seconds   Comment:            IF BASELINE aPTT IS ELEVATED,     SUGGEST PATIENT RISK ASSESSMENT     BE USED TO DETERMINE APPROPRIATE     ANTICOAGULANT THERAPY.  CBC     Status: Abnormal   Collection Time    06/02/12  7:30 AM      Result Value Range   WBC 7.5  4.0 - 10.5 K/uL   RBC  4.26  4.22 - 5.81 MIL/uL   Hemoglobin 11.1 (*) 13.0 - 17.0 g/dL   HCT 16.1 (*) 09.6 - 04.5 %   MCV 79.8  78.0 - 100.0 fL   MCH 26.1  26.0 - 34.0 pg   MCHC 32.6  30.0 - 36.0 g/dL   RDW 40.9  81.1 - 91.4 %   Platelets 385  150 - 400 K/uL  PROTIME-INR     Status: None   Collection Time    06/02/12  7:30 AM      Result Value Range   Prothrombin Time 13.7  11.6 - 15.2 seconds   INR 1.06  0.00 - 1.49   No results found.  Review of Systems  Constitutional: Positive for weight loss and malaise/fatigue. Negative for fever.       Occ sweats  Respiratory: Negative for cough and shortness of breath.   Cardiovascular: Negative for chest pain.  Gastrointestinal: Negative for nausea, vomiting and abdominal pain.  Musculoskeletal: Negative for back pain.  Neurological: Negative for headaches.    Blood pressure 102/70, pulse 91, temperature 98.5 F (36.9 C), temperature source Oral, resp. rate  18, SpO2 100.00%. Physical Exam  Constitutional: He is oriented to person, place, and time.  Cachetic WM in NAD  Cardiovascular: Normal rate and regular rhythm.   Respiratory: Effort normal and breath sounds normal.  GI: Soft. Bowel sounds are normal. There is no tenderness.  Musculoskeletal: Normal range of motion. He exhibits no edema.  Neurological: He is alert and oriented to person, place, and time.     Assessment/Plan: Pt with hx of large B cell lymphoma. Plan is for CT guided bone marrow biopsy today. Details/risks of procedure d/w pt with his understanding and consent.  [name],D KEVIN 06/02/2012, 8:10 AM

## 2012-06-03 ENCOUNTER — Telehealth: Payer: Self-pay | Admitting: *Deleted

## 2012-06-03 ENCOUNTER — Other Ambulatory Visit: Payer: Self-pay | Admitting: Radiology

## 2012-06-03 ENCOUNTER — Telehealth: Payer: Self-pay | Admitting: Oncology

## 2012-06-03 ENCOUNTER — Other Ambulatory Visit: Payer: Self-pay

## 2012-06-03 NOTE — Telephone Encounter (Signed)
No additional notes

## 2012-06-04 ENCOUNTER — Other Ambulatory Visit: Payer: Self-pay | Admitting: Oncology

## 2012-06-04 ENCOUNTER — Telehealth: Payer: Self-pay | Admitting: *Deleted

## 2012-06-04 ENCOUNTER — Ambulatory Visit (HOSPITAL_COMMUNITY)
Admission: RE | Admit: 2012-06-04 | Discharge: 2012-06-04 | Disposition: A | Discharge status: Home / Self Care | Payer: Medicaid Other | Source: Ambulatory Visit | Attending: Oncology | Admitting: Oncology

## 2012-06-04 ENCOUNTER — Encounter (HOSPITAL_COMMUNITY): Payer: Self-pay

## 2012-06-04 DIAGNOSIS — C8589 Other specified types of non-Hodgkin lymphoma, extranodal and solid organ sites: Secondary | ICD-10-CM

## 2012-06-04 DIAGNOSIS — C8330 Diffuse large B-cell lymphoma, unspecified site: Secondary | ICD-10-CM

## 2012-06-04 DIAGNOSIS — K1379 Other lesions of oral mucosa: Secondary | ICD-10-CM

## 2012-06-04 DIAGNOSIS — K219 Gastro-esophageal reflux disease without esophagitis: Secondary | ICD-10-CM | POA: Insufficient documentation

## 2012-06-04 DIAGNOSIS — J36 Peritonsillar abscess: Secondary | ICD-10-CM

## 2012-06-04 DIAGNOSIS — F17200 Nicotine dependence, unspecified, uncomplicated: Secondary | ICD-10-CM | POA: Insufficient documentation

## 2012-06-04 DIAGNOSIS — R6889 Other general symptoms and signs: Secondary | ICD-10-CM | POA: Insufficient documentation

## 2012-06-04 MED ORDER — HEPARIN SOD (PORK) LOCK FLUSH 100 UNIT/ML IV SOLN
500.0000 [IU] | Freq: Once | INTRAVENOUS | Status: AC
  Administered 2012-06-04: 500 [IU] via INTRAVENOUS

## 2012-06-04 MED ORDER — HYDROMORPHONE HCL 4 MG PO TABS: 4.0000 mg | ORAL_TABLET | Freq: Four times a day (QID) | ORAL | Status: DC | PRN

## 2012-06-04 MED ORDER — MIDAZOLAM HCL 2 MG/2ML IJ SOLN
INTRAMUSCULAR | Status: AC
  Filled 2012-06-04: qty 4

## 2012-06-04 MED ORDER — VANCOMYCIN HCL IN DEXTROSE 1-5 GM/200ML-% IV SOLN
1000.0000 mg | Freq: Once | INTRAVENOUS | Status: AC
  Administered 2012-06-04: 1000 mg via INTRAVENOUS
  Filled 2012-06-04: qty 200

## 2012-06-04 MED ORDER — MORPHINE SULFATE ER 30 MG PO TBCR: 30.0000 mg | EXTENDED_RELEASE_TABLET | Freq: Two times a day (BID) | ORAL | Status: DC

## 2012-06-04 MED ORDER — SODIUM CHLORIDE 0.9 % IV SOLN
INTRAVENOUS | Status: DC
  Administered 2012-06-04: 12:00:00 via INTRAVENOUS

## 2012-06-04 MED ORDER — MIDAZOLAM HCL 2 MG/2ML IJ SOLN
INTRAMUSCULAR | Status: AC | PRN
  Administered 2012-06-04: 1 mg via INTRAVENOUS
  Administered 2012-06-04: 2 mg via INTRAVENOUS

## 2012-06-04 MED ORDER — FENTANYL CITRATE 0.05 MG/ML IJ SOLN
INTRAMUSCULAR | Status: AC
  Filled 2012-06-04: qty 4

## 2012-06-04 MED ORDER — LIDOCAINE HCL 1 % IJ SOLN
INTRAMUSCULAR | Status: AC
  Filled 2012-06-04: qty 20

## 2012-06-04 MED ORDER — FENTANYL CITRATE 0.05 MG/ML IJ SOLN
INTRAMUSCULAR | Status: AC | PRN
  Administered 2012-06-04: 100 ug via INTRAVENOUS

## 2012-06-04 NOTE — Telephone Encounter (Signed)
Walk in form left by patient earlier when en route for port-a-cath placement with instructions to call two different cell numbers.  Reached patient at [phone] who says he "believes he has become immune to the MS contin and the Percocet.  Throat hurts, can't eat and has general body aches.  Can't wait to start this chemotherapy but I'm not going to lie around in pain."  Says he takes the MS contin q 12 hrs but does take more of the percocet and may uses five pills each day.  Says if needed he'll talk with MD.

## 2012-06-04 NOTE — Progress Notes (Addendum)
Placed bulky gauze dressing over bone marrow biopsy site to left lower back at request of P.A. Pam Turbin.  Placed for pt comfort; site is dry, no drainage present, no redness noted.  Site is sore only.  Biopsy was done about 2 days ago.

## 2012-06-04 NOTE — H&P (Signed)
Nicholas King is an 42 y.o. male.   Chief Complaint: Lymphoma- recent diagnosis Throat lesion/tumor Scheduled now for port a cath placement HPI: GERD; dysphagia; wt loss; left iliac pain(post BM bx 4/28- neg); smoker  Past Medical History  Diagnosis Date  . GERD (gastroesophageal reflux disease)   . Cancer   . Lymphoma   . Dysphagia, unspecified 05/16/2012  . Pneumonia     April 2014    Past Surgical History  Procedure Laterality Date  . No past surgeries    . Direct laryngoscopy Left 05/08/2012    Procedure: DIRECT LARYNGOSCOPY with biopsy left tonsil;  Surgeon: Suzanna Obey, MD;  Location: [facility] OR;  Service: ENT;  Laterality: Left;  . Right eyebrown bone      Family History  Problem Relation Age of Onset  . Cancer Father     lung  . Diabetes Father   . Cancer Maternal Aunt     gyn cancer, pancreas  . Cancer Paternal Aunt     pancreas cancer  . Cancer Paternal Uncle     unk   Social History:  reports that he has been smoking Cigarettes.  He has a 12.5 pack-year smoking history. He does not have any smokeless tobacco history on file. He reports that he uses illicit drugs (Marijuana). He reports that he does not drink alcohol.  Allergies:  Allergies  Allergen Reactions  . Penicillins Hives  . Amoxicillin Rash  . Hydrocodone Rash     (Not in a hospital admission)  No results found for this or any previous visit (from the past 48 hour(s)). No results found.  Review of Systems  Constitutional: Positive for weight loss. Negative for fever.  HENT: Positive for sore throat.   Eyes: Negative for blurred vision.  Respiratory: Negative for shortness of breath.   Cardiovascular: Negative for chest pain.  Gastrointestinal: Negative for nausea, vomiting and abdominal pain.  Musculoskeletal: Positive for back pain.       Pain at left iliac BM site from 4/28  Neurological: Positive for weakness. Negative for dizziness and headaches.    There were no vitals taken for this  visit. Physical Exam  Constitutional: He is oriented to person, place, and time.  thin  HENT:  Visible tumor at back of throat  Neck:  Painful ROM  Cardiovascular: Normal rate, regular rhythm and normal heart sounds.   No murmur heard. Respiratory: Effort normal and breath sounds normal. He has no wheezes.  GI: Soft. Bowel sounds are normal. There is no tenderness.  Musculoskeletal: Normal range of motion.  Pain at L ilaic BM site No sign of infection No redness No swelling No hematoma  Neurological: He is alert and oriented to person, place, and time.  Skin: Skin is warm and dry.  Psychiatric: He has a normal mood and affect. His behavior is normal. Judgment and thought content normal.     Assessment/Plan Lymphoma; throat lesion Scheduled for [facility] placement Pt aware of procedure benefits and risks and agreeable to proceed Consent signed and in chart  Deshawnda Acrey A 06/04/2012, 12:27 PM

## 2012-06-04 NOTE — Procedures (Signed)
R IJ Port catheter placed No complication No blood loss. See complete dictation in [facility].

## 2012-06-04 NOTE — Telephone Encounter (Signed)
Dr. Gaylyn Rong signed new Rx for MS Contin 30 mg and Dilaudid 4 mg tablets.  Pt had gone home by the time this RN was able to bring Rx out.  Called pt and he had already gone home.  Explained increase in MS Contin, he can take two of the 15 mg tablets twice daily until he picks up new rx.  Informed of new rx for dilaudid instead of percocet.  Pt verbalized understanding and will ask for Rx when he comes in tomorrow for chemo class.    Rxs left in book locked in injection room.

## 2012-06-05 ENCOUNTER — Other Ambulatory Visit: Payer: Self-pay

## 2012-06-06 ENCOUNTER — Other Ambulatory Visit: Payer: Self-pay | Admitting: Lab

## 2012-06-06 ENCOUNTER — Ambulatory Visit: Payer: Self-pay | Admitting: Oncology

## 2012-06-09 ENCOUNTER — Other Ambulatory Visit: Payer: Self-pay | Admitting: Oncology

## 2012-06-09 ENCOUNTER — Other Ambulatory Visit (HOSPITAL_BASED_OUTPATIENT_CLINIC_OR_DEPARTMENT_OTHER): Payer: Self-pay | Admitting: Lab

## 2012-06-09 ENCOUNTER — Telehealth: Payer: Self-pay | Admitting: Oncology

## 2012-06-09 ENCOUNTER — Ambulatory Visit (HOSPITAL_BASED_OUTPATIENT_CLINIC_OR_DEPARTMENT_OTHER): Payer: Self-pay | Admitting: Oncology

## 2012-06-09 ENCOUNTER — Ambulatory Visit (HOSPITAL_BASED_OUTPATIENT_CLINIC_OR_DEPARTMENT_OTHER): Payer: Self-pay

## 2012-06-09 VITALS — BP 95/59 | HR 65 | Temp 97.2°F | Resp 14

## 2012-06-09 VITALS — BP 122/86 | HR 88 | Temp 97.7°F | Resp 18 | Ht 67.0 in | Wt 99.3 lb

## 2012-06-09 DIAGNOSIS — C8589 Other specified types of non-Hodgkin lymphoma, extranodal and solid organ sites: Secondary | ICD-10-CM

## 2012-06-09 DIAGNOSIS — Z5112 Encounter for antineoplastic immunotherapy: Secondary | ICD-10-CM

## 2012-06-09 DIAGNOSIS — Z5111 Encounter for antineoplastic chemotherapy: Secondary | ICD-10-CM

## 2012-06-09 DIAGNOSIS — C8330 Diffuse large B-cell lymphoma, unspecified site: Secondary | ICD-10-CM

## 2012-06-09 DIAGNOSIS — F17200 Nicotine dependence, unspecified, uncomplicated: Secondary | ICD-10-CM

## 2012-06-09 DIAGNOSIS — J852 Abscess of lung without pneumonia: Secondary | ICD-10-CM

## 2012-06-09 LAB — COMPREHENSIVE METABOLIC PANEL (CC13)
AST: 14 U/L (ref 5–34)
Albumin: 2.5 g/dL — ABNORMAL LOW (ref 3.5–5.0)
Alkaline Phosphatase: 55 U/L (ref 40–150)
BUN: 6.2 mg/dL — ABNORMAL LOW (ref 7.0–26.0)
Potassium: 4.2 mEq/L (ref 3.5–5.1)
Sodium: 138 mEq/L (ref 136–145)
Total Bilirubin: 0.39 mg/dL (ref 0.20–1.20)
Total Protein: 6.9 g/dL (ref 6.4–8.3)

## 2012-06-09 LAB — CBC WITH DIFFERENTIAL/PLATELET
BASO%: 0.3 % (ref 0.0–2.0)
MCHC: 31.9 g/dL — ABNORMAL LOW (ref 32.0–36.0)
MONO#: 0.7 10*3/uL (ref 0.1–0.9)
RBC: 4.57 10*6/uL (ref 4.20–5.82)
WBC: 6.3 10*3/uL (ref 4.0–10.3)
lymph#: 1.4 10*3/uL (ref 0.9–3.3)
nRBC: 0 % (ref 0–0)

## 2012-06-09 MED ORDER — SODIUM CHLORIDE 0.9 % IV SOLN
750.0000 mg/m2 | Freq: Once | INTRAVENOUS | Status: AC
  Administered 2012-06-09: 1080 mg via INTRAVENOUS
  Filled 2012-06-09: qty 54

## 2012-06-09 MED ORDER — DIPHENHYDRAMINE HCL 25 MG PO CAPS
50.0000 mg | ORAL_CAPSULE | Freq: Once | ORAL | Status: AC
  Administered 2012-06-09: 50 mg via ORAL

## 2012-06-09 MED ORDER — DOXORUBICIN HCL CHEMO IV INJECTION 2 MG/ML
50.0000 mg/m2 | Freq: Once | INTRAVENOUS | Status: AC
  Administered 2012-06-09: 72 mg via INTRAVENOUS
  Filled 2012-06-09: qty 36

## 2012-06-09 MED ORDER — HEPARIN SOD (PORK) LOCK FLUSH 100 UNIT/ML IV SOLN
500.0000 [IU] | Freq: Once | INTRAVENOUS | Status: AC | PRN
  Administered 2012-06-09: 500 [IU]
  Filled 2012-06-09: qty 5

## 2012-06-09 MED ORDER — VINCRISTINE SULFATE CHEMO INJECTION 1 MG/ML
2.0000 mg | Freq: Once | INTRAVENOUS | Status: AC
  Administered 2012-06-09: 2 mg via INTRAVENOUS
  Filled 2012-06-09: qty 2

## 2012-06-09 MED ORDER — ACETAMINOPHEN 325 MG PO TABS
650.0000 mg | ORAL_TABLET | Freq: Once | ORAL | Status: AC
  Administered 2012-06-09: 650 mg via ORAL

## 2012-06-09 MED ORDER — SODIUM CHLORIDE 0.9 % IV SOLN
375.0000 mg/m2 | Freq: Once | INTRAVENOUS | Status: AC
  Administered 2012-06-09: 500 mg via INTRAVENOUS
  Filled 2012-06-09: qty 50

## 2012-06-09 MED ORDER — ONDANSETRON 16 MG/50ML IVPB (CHCC)
16.0000 mg | Freq: Once | INTRAVENOUS | Status: AC
  Administered 2012-06-09: 16 mg via INTRAVENOUS

## 2012-06-09 MED ORDER — SODIUM CHLORIDE 0.9 % IV SOLN
Freq: Once | INTRAVENOUS | Status: AC
  Administered 2012-06-09: 10:00:00 via INTRAVENOUS

## 2012-06-09 MED ORDER — SODIUM CHLORIDE 0.9 % IJ SOLN
10.0000 mL | INTRAMUSCULAR | Status: DC | PRN
  Administered 2012-06-09: 10 mL
  Filled 2012-06-09: qty 10

## 2012-06-09 MED ORDER — DEXAMETHASONE SODIUM PHOSPHATE 20 MG/5ML IJ SOLN
20.0000 mg | Freq: Once | INTRAMUSCULAR | Status: AC
  Administered 2012-06-09: 20 mg via INTRAVENOUS

## 2012-06-09 MED ORDER — NICOTINE 14 MG/24HR TD PT24: 1.0000 | MEDICATED_PATCH | TRANSDERMAL | Status: DC

## 2012-06-09 NOTE — Patient Instructions (Addendum)
Vincristine injection What is this medicine? VINCRISTINE (vin KRIS teen) is a chemotherapy drug. It slows the growth of cancer cells. This medicine is used to treat many types of cancer like Hodgkin's disease, leukemia, non-Hodgkin's lymphoma, neuroblastoma (brain cancer), rhabdomyosarcoma, and Wilms' tumor. This medicine may be used for other purposes; ask your health care provider or pharmacist if you have questions. What should I tell my health care provider before I take this medicine? They need to know if you have any of these conditions: -blood disorders -gout -infection (especially chickenpox, cold sores, or herpes) -kidney disease -liver disease -lung disease -nervous system disease like Charcot-Marie-Tooth (CMT) -recent or ongoing radiation therapy -an unusual or allergic reaction to vincristine, other chemotherapy agents, other medicines, foods, dyes, or preservatives -pregnant or trying to get pregnant -breast-feeding How should I use this medicine? This drug is given as an infusion into a vein. It is administered in a hospital or clinic by a specially trained health care professional. If you have pain, swelling, burning, or any unusual feeling around the site of your injection, tell your health care professional right away. Talk to your pediatrician regarding the use of this medicine in children. While this drug may be prescribed for selected conditions, precautions do apply. Overdosage: If you think you have taken too much of this medicine contact a poison control center or emergency room at once. NOTE: This medicine is only for you. Do not share this medicine with others. What if I miss a dose? It is important not to miss your dose. Call your doctor or health care professional if you are unable to keep an appointment. What may interact with this medicine? Do not take this medicine with any of the following medications: -itraconazole -mibefradil -voriconazole This medicine  may also interact with the following medications: -cyclosporine -erythromycin -fluconazole -ketoconazole -medicines for HIV like delavirdine, efavirenz, nevirapine -medicines for seizures like ethotoin, fosphenotoin, phenytoin -medicines to increase blood counts like filgrastim, pegfilgrastim, sargramostim -other chemotherapy drugs like cisplatin, L-asparaginase, methotrexate, mitomycin, paclitaxel -pegaspargase -vaccines -zalcitabine, ddC Talk to your doctor or health care professional before taking any of these medicines: -acetaminophen -aspirin -ibuprofen -ketoprofen -naproxen This list may not describe all possible interactions. Give your health care provider a list of all the medicines, herbs, non-prescription drugs, or dietary supplements you use. Also tell them if you smoke, drink alcohol, or use illegal drugs. Some items may interact with your medicine. What should I watch for while using this medicine? Your condition will be monitored carefully while you are receiving this medicine. You will need important blood work done while you are taking this medicine. This drug may make you feel generally unwell. This is not uncommon, as chemotherapy can affect healthy cells as well as cancer cells. Report any side effects. Continue your course of treatment even though you feel ill unless your doctor tells you to stop. In some cases, you may be given additional medicines to help with side effects. Follow all directions for their use. Call your doctor or health care professional for advice if you get a fever, chills or sore throat, or other symptoms of a cold or flu. Do not treat yourself. Avoid taking products that contain aspirin, acetaminophen, ibuprofen, naproxen, or ketoprofen unless instructed by your doctor. These medicines may hide a fever. Do not become pregnant while taking this medicine. Women should inform their doctor if they wish to become pregnant or think they might be pregnant.  There is a potential for serious side effects to  an unborn child. Talk to your health care professional or pharmacist for more information. Do not breast-feed an infant while taking this medicine. Men may have a lower sperm count while taking this medicine. Talk to your doctor if you plan to father a child. What side effects may I notice from receiving this medicine? Side effects that you should report to your doctor or health care professional as soon as possible: -allergic reactions like skin rash, itching or hives, swelling of the face, lips, or tongue -breathing problems -confusion or changes in emotions or moods -constipation -cough -mouth sores -muscle weakness -nausea and vomiting -pain, swelling, redness or irritation at the injection site -pain, tingling, numbness in the hands or feet -problems with balance, talking, walking -seizures -stomach pain -trouble passing urine or change in the amount of urine Side effects that usually do not require medical attention (report to your doctor or health care professional if they continue or are bothersome): -diarrhea -hair loss -jaw pain -loss of appetite This list may not describe all possible side effects. Call your doctor for medical advice about side effects. You may report side effects to FDA at 1-800-FDA-1088. Where should I keep my medicine? This drug is given in a hospital or clinic and will not be stored at home. NOTE: This sheet is a summary. It may not cover all possible information. If you have questions about this medicine, talk to your doctor, pharmacist, or health care provider.  2012, [employer]. (10/20/2007 5:17:13 PM)Doxorubicin injection What is this medicine? DOXORUBICIN (dox oh ROO bi sin) is a chemotherapy drug. It is used to treat many kinds of cancer like Hodgkin's disease, leukemia, non-Hodgkin's lymphoma, neuroblastoma, sarcoma, and Wilms' tumor. It is also used to treat bladder cancer, breast cancer,  lung cancer, ovarian cancer, stomach cancer, and thyroid cancer. This medicine may be used for other purposes; ask your health care provider or pharmacist if you have questions. What should I tell my health care provider before I take this medicine? They need to know if you have any of these conditions: -blood disorders -heart disease, recent heart attack -infection (especially a virus infection such as chickenpox, cold sores, or herpes) -irregular heartbeat -liver disease -recent or ongoing radiation therapy -an unusual or allergic reaction to doxorubicin, other chemotherapy agents, other medicines, foods, dyes, or preservatives -pregnant or trying to get pregnant -breast-feeding How should I use this medicine? This drug is given as an infusion into a vein. It is administered in a hospital or clinic by a specially trained health care professional. If you have pain, swelling, burning or any unusual feeling around the site of your injection, tell your health care professional right away. Talk to your pediatrician regarding the use of this medicine in children. Special care may be needed. Overdosage: If you think you have taken too much of this medicine contact a poison control center or emergency room at once. NOTE: This medicine is only for you. Do not share this medicine with others. What if I miss a dose? It is important not to miss your dose. Call your doctor or health care professional if you are unable to keep an appointment. What may interact with this medicine? Do not take this medicine with any of the following medications: -cisapride -droperidol -halofantrine -pimozide -zidovudine This medicine may also interact with the following medications: -chloroquine -chlorpromazine -clarithromycin -cyclophosphamide -cyclosporine -erythromycin -medicines for depression, anxiety, or psychotic disturbances -medicines for irregular heart beat like amiodarone, bepridil, dofetilide,  encainide, flecainide, propafenone, quinidine -medicines  for seizures like ethotoin, fosphenytoin, phenytoin -medicines for nausea, vomiting like dolasetron, ondansetron, palonosetron -medicines to increase blood counts like filgrastim, pegfilgrastim, sargramostim -methadone -methotrexate -pentamidine -progesterone -vaccines -verapamil Talk to your doctor or health care professional before taking any of these medicines: -acetaminophen -aspirin -ibuprofen -ketoprofen -naproxen This list may not describe all possible interactions. Give your health care provider a list of all the medicines, herbs, non-prescription drugs, or dietary supplements you use. Also tell them if you smoke, drink alcohol, or use illegal drugs. Some items may interact with your medicine. What should I watch for while using this medicine? Your condition will be monitored carefully while you are receiving this medicine. You will need important blood work done while you are taking this medicine. This drug may make you feel generally unwell. This is not uncommon, as chemotherapy can affect healthy cells as well as cancer cells. Report any side effects. Continue your course of treatment even though you feel ill unless your doctor tells you to stop. Your urine may turn red for a few days after your dose. This is not blood. If your urine is dark or brown, call your doctor. In some cases, you may be given additional medicines to help with side effects. Follow all directions for their use. Call your doctor or health care professional for advice if you get a fever, chills or sore throat, or other symptoms of a cold or flu. Do not treat yourself. This drug decreases your body's ability to fight infections. Try to avoid being around people who are sick. This medicine may increase your risk to bruise or bleed. Call your doctor or health care professional if you notice any unusual bleeding. Be careful brushing and flossing your teeth or  using a toothpick because you may get an infection or bleed more easily. If you have any dental work done, tell your dentist you are receiving this medicine. Avoid taking products that contain aspirin, acetaminophen, ibuprofen, naproxen, or ketoprofen unless instructed by your doctor. These medicines may hide a fever. Men and women of childbearing age should use effective birth control methods while using taking this medicine. Do not become pregnant while taking this medicine. There is a potential for serious side effects to an unborn child. Talk to your health care professional or pharmacist for more information. Do not breast-feed an infant while taking this medicine. Do not let others touch your urine or other body fluids for 5 days after each treatment with this medicine. Caregivers should wear latex gloves to avoid touching body fluids during this time. What side effects may I notice from receiving this medicine? Side effects that you should report to your doctor or health care professional as soon as possible: -allergic reactions like skin rash, itching or hives, swelling of the face, lips, or tongue -low blood counts - this medicine may decrease the number of white blood cells, red blood cells and platelets. You may be at increased risk for infections and bleeding. -signs of infection - fever or chills, cough, sore throat, pain or difficulty passing urine -signs of decreased platelets or bleeding - bruising, pinpoint red spots on the skin, black, tarry stools, blood in the urine -signs of decreased red blood cells - unusually weak or tired, fainting spells, lightheadedness -breathing problems -chest pain -fast, irregular heartbeat -mouth sores -nausea, vomiting -pain, swelling, redness at site where injected -pain, tingling, numbness in the hands or feet -swelling of ankles, feet, or hands -unusual bleeding or bruising Side effects that usually do  not require medical attention (report to  your doctor or health care professional if they continue or are bothersome): -diarrhea -facial flushing -hair loss -loss of appetite -missed menstrual periods -nail discoloration or damage -red or watery eyes -red colored urine -stomach upset This list may not describe all possible side effects. Call your doctor for medical advice about side effects. You may report side effects to FDA at 1-800-FDA-1088. Where should I keep my medicine? This drug is given in a hospital or clinic and will not be stored at home. NOTE: This sheet is a summary. It may not cover all possible information. If you have questions about this medicine, talk to your doctor, pharmacist, or health care provider.  2013, [employer]. (05/13/2007 5:07:32 PM) Rituximab injection What is this medicine? RITUXIMAB (ri TUX i mab) is a monoclonal antibody. This medicine changes the way the body's immune system works. It is used commonly to treat non-Hodgkin's lymphoma and other conditions. In cancer cells, this drug targets a specific protein within cancer cells and stops the cancer cells from growing. It is also used to treat rhuematoid arthritis (RA). In RA, this medicine slow the inflammatory process and help reduce joint pain and swelling. This medicine is often used with other cancer or arthritis medications. This medicine may be used for other purposes; ask your health care provider or pharmacist if you have questions. What should I tell my health care provider before I take this medicine? They need to know if you have any of these conditions: -blood disorders -heart disease -history of hepatitis B -infection (especially a virus infection such as chickenpox, cold sores, or herpes) -irregular heartbeat -kidney disease -lung or breathing disease, like asthma -lupus -an unusual or allergic reaction to rituximab, mouse proteins, other medicines, foods, dyes, or preservatives -pregnant or trying to get  pregnant -breast-feeding How should I use this medicine? This medicine is for infusion into a vein. It is administered in a hospital or clinic by a specially trained health care professional. A special MedGuide will be given to you by the pharmacist with each prescription and refill. Be sure to read this information carefully each time. Talk to your pediatrician regarding the use of this medicine in children. This medicine is not approved for use in children. Overdosage: If you think you have taken too much of this medicine contact a poison control center or emergency room at once. NOTE: This medicine is only for you. Do not share this medicine with others. What if I miss a dose? It is important not to miss a dose. Call your doctor or health care professional if you are unable to keep an appointment. What may interact with this medicine? -cisplatin -medicines for blood pressure -some other medicines for arthritis -vaccines This list may not describe all possible interactions. Give your health care provider a list of all the medicines, herbs, non-prescription drugs, or dietary supplements you use. Also tell them if you smoke, drink alcohol, or use illegal drugs. Some items may interact with your medicine. What should I watch for while using this medicine? Report any side effects that you notice during your treatment right away, such as changes in your breathing, fever, chills, dizziness or lightheadedness. These effects are more common with the first dose. Visit your prescriber or health care professional for checks on your progress. You will need to have regular blood work. Report any other side effects. The side effects of this medicine can continue after you finish your treatment. Continue your course  of treatment even though you feel ill unless your doctor tells you to stop. Call your doctor or health care professional for advice if you get a fever, chills or sore throat, or other symptoms of a  cold or flu. Do not treat yourself. This drug decreases your body's ability to fight infections. Try to avoid being around people who are sick. This medicine may increase your risk to bruise or bleed. Call your doctor or health care professional if you notice any unusual bleeding. Be careful brushing and flossing your teeth or using a toothpick because you may get an infection or bleed more easily. If you have any dental work done, tell your dentist you are receiving this medicine. Avoid taking products that contain aspirin, acetaminophen, ibuprofen, naproxen, or ketoprofen unless instructed by your doctor. These medicines may hide a fever. Do not become pregnant while taking this medicine. Women should inform their doctor if they wish to become pregnant or think they might be pregnant. There is a potential for serious side effects to an unborn child. Talk to your health care professional or pharmacist for more information. Do not breast-feed an infant while taking this medicine. What side effects may I notice from receiving this medicine? Side effects that you should report to your doctor or health care professional as soon as possible: -allergic reactions like skin rash, itching or hives, swelling of the face, lips, or tongue -low blood counts - this medicine may decrease the number of white blood cells, red blood cells and platelets. You may be at increased risk for infections and bleeding. -signs of infection - fever or chills, cough, sore throat, pain or difficulty passing urine -signs of decreased platelets or bleeding - bruising, pinpoint red spots on the skin, black, tarry stools, blood in the urine -signs of decreased red blood cells - unusually weak or tired, fainting spells, lightheadedness -breathing problems -confused, not responsive -chest pain -fast, irregular heartbeat -feeling faint or lightheaded, falls -mouth sores -redness, blistering, peeling or loosening of the skin, including  inside the mouth -stomach pain -swelling of the ankles, feet, or hands -trouble passing urine or change in the amount of urine Side effects that usually do not require medical attention (report to your doctor or other health care professional if they continue or are bothersome): -anxiety -headache -loss of appetite -muscle aches -nausea -night sweats This list may not describe all possible side effects. Call your doctor for medical advice about side effects. You may report side effects to FDA at 1-800-FDA-1088. Where should I keep my medicine? This drug is given in a hospital or clinic and will not be stored at home. NOTE: This sheet is a summary. It may not cover all possible information. If you have questions about this medicine, talk to your doctor, pharmacist, or health care provider.  2013, [employer]. (09/22/2007 2:04:59 PM) Cyclophosphamide injection What is this medicine? CYCLOPHOSPHAMIDE (sye kloe FOSS fa mide) is a chemotherapy drug. It slows the growth of cancer cells. This medicine is used to treat many types of cancer like lymphoma, myeloma, leukemia, breast cancer, and ovarian cancer, to name a few. It is also used to treat nephrotic syndrome in children. This medicine may be used for other purposes; ask your health care provider or pharmacist if you have questions. What should I tell my health care provider before I take this medicine? They need to know if you have any of these conditions: -blood disorders -history of other chemotherapy -history of radiation therapy -infection -  kidney disease -liver disease -tumors in the bone marrow -an unusual or allergic reaction to cyclophosphamide, other chemotherapy, other medicines, foods, dyes, or preservatives -pregnant or trying to get pregnant -breast-feeding How should I use this medicine? This drug is usually given as an injection into a vein or muscle or by infusion into a vein. It is administered in a  hospital or clinic by a specially trained health care professional. Talk to your pediatrician regarding the use of this medicine in children. While this drug may be prescribed for selected conditions, precautions do apply. Overdosage: If you think you have taken too much of this medicine contact a poison control center or emergency room at once. NOTE: This medicine is only for you. Do not share this medicine with others. What if I miss a dose? It is important not to miss your dose. Call your doctor or health care professional if you are unable to keep an appointment. What may interact with this medicine? Do not take this medicine with any of the following medications: -mibefradil -nalidixic acid This medicine may also interact with the following medications: -doxorubicin -etanercept -medicines to increase blood counts like filgrastim, pegfilgrastim, sargramostim -medicines that block muscle or nerve pain -St. John's Wort -phenobarbital -succinylcholine chloride -trastuzumab -vaccines Talk to your doctor or health care professional before taking any of these medicines: -acetaminophen -aspirin -ibuprofen -ketoprofen -naproxen This list may not describe all possible interactions. Give your health care provider a list of all the medicines, herbs, non-prescription drugs, or dietary supplements you use. Also tell them if you smoke, drink alcohol, or use illegal drugs. Some items may interact with your medicine. What should I watch for while using this medicine? Visit your doctor for checks on your progress. This drug may make you feel generally unwell. This is not uncommon, as chemotherapy can affect healthy cells as well as cancer cells. Report any side effects. Continue your course of treatment even though you feel ill unless your doctor tells you to stop. Drink water or other fluids as directed. Urinate often, even at night. In some cases, you may be given additional medicines to help with  side effects. Follow all directions for their use. Call your doctor or health care professional for advice if you get a fever, chills or sore throat, or other symptoms of a cold or flu. Do not treat yourself. This drug decreases your body's ability to fight infections. Try to avoid being around people who are sick. This medicine may increase your risk to bruise or bleed. Call your doctor or health care professional if you notice any unusual bleeding. Be careful brushing and flossing your teeth or using a toothpick because you may get an infection or bleed more easily. If you have any dental work done, tell your dentist you are receiving this medicine. Avoid taking products that contain aspirin, acetaminophen, ibuprofen, naproxen, or ketoprofen unless instructed by your doctor. These medicines may hide a fever. Do not become pregnant while taking this medicine. Women should inform their doctor if they wish to become pregnant or think they might be pregnant. There is a potential for serious side effects to an unborn child. Talk to your health care professional or pharmacist for more information. Do not breast-feed an infant while taking this medicine. Men should inform their doctor if they wish to father a child. This medicine may lower sperm counts. If you are going to have surgery, tell your doctor or health care professional that you have taken this medicine. What  side effects may I notice from receiving this medicine? Side effects that you should report to your doctor or health care professional as soon as possible: -allergic reactions like skin rash, itching or hives, swelling of the face, lips, or tongue -low blood counts - this medicine may decrease the number of white blood cells, red blood cells and platelets. You may be at increased risk for infections and bleeding. -signs of infection - fever or chills, cough, sore throat, pain or difficulty passing urine -signs of decreased platelets or  bleeding - bruising, pinpoint red spots on the skin, black, tarry stools, blood in the urine -signs of decreased red blood cells - unusually weak or tired, fainting spells, lightheadedness -breathing problems -dark urine -mouth sores -pain, swelling, redness at site where injected -swelling of the ankles, feet, hands -trouble passing urine or change in the amount of urine -weight gain -yellowing of the eyes or skin Side effects that usually do not require medical attention (report to your doctor or health care professional if they continue or are bothersome): -changes in nail or skin color -diarrhea -hair loss -loss of appetite -missed menstrual periods -nausea, vomiting -stomach pain This list may not describe all possible side effects. Call your doctor for medical advice about side effects. You may report side effects to FDA at 1-800-FDA-1088. Where should I keep my medicine? This drug is given in a hospital or clinic and will not be stored at home. NOTE: This sheet is a summary. It may not cover all possible information. If you have questions about this medicine, talk to your doctor, pharmacist, or health care provider.  2012, Elsevier/Gold Standard. (04/29/2007 2:32:25 PM)

## 2012-06-09 NOTE — Progress Notes (Signed)
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   PAST THERAPY:  Biopsy only   CURRENT THERAPY: due to start chemo R-CHOP today; every 3 weeks; for goal of chemo about 4 cycles depending on follow up interval PET scan; Neulasta on day # 2 of each chemo cycle; possible consolidative radiation; and intrathecal prophylactic chemo with cycle #2 onward.   INTERVAL HISTORY: Nicholas King 42 y.o. male returns for regular follow up to start chemo.  He developed left lung abscess and was given a long course of antibiotic with Dr. Orvan Falconer.  He no longer has fever, chest pain, productive cough.  He still had severe sorethroat last week despite pain meds.  He was given higher dose of MS Contin to 30mg  PO BID and breakthrough with Dialaudid with significant improvement.  He has been able to swallow by mouth, increased weight.  He is able to perform chores around the house.    Patient denies fever, anorexia, weight loss, fatigue, headache, visual changes, confusion, drenching night sweats, palpable lymph node swelling, mucositis, odynophagia, dysphagia, nausea vomiting, jaundice, chest pain, palpitation, shortness of breath, dyspnea on exertion, productive cough, gum bleeding, epistaxis, hematemesis, hemoptysis, abdominal pain, abdominal swelling, early satiety, melena, hematochezia, hematuria, skin rash, spontaneous bleeding, joint swelling, joint pain, heat or cold intolerance, bowel bladder incontinence, back pain, focal motor weakness, paresthesia, depression.    Past Medical History  Diagnosis Date  . GERD (gastroesophageal reflux disease)   . Cancer   . Lymphoma   . Dysphagia, unspecified 05/16/2012  . Pneumonia     April 2014    Past Surgical History  Procedure Laterality Date  . No past surgeries    . Direct laryngoscopy Left 05/08/2012    Procedure: DIRECT LARYNGOSCOPY with biopsy left  tonsil;  Surgeon: Suzanna Obey, MD;  Location: [facility] OR;  Service: ENT;  Laterality: Left;  . Right eyebrown bone      Current Outpatient Prescriptions  Medication Sig Dispense Refill  . Alum & Mag Hydroxide-Simeth (MAGIC MOUTHWASH W/LIDOCAINE) SOLN Take 15 mLs by mouth 4 (four) times daily as needed (for mouth pain.).      Marland Kitchen cephALEXin (KEFLEX) 500 MG capsule Take 500 mg by mouth 4 (four) times daily.      . fluconazole (DIFLUCAN) 100 MG tablet Take 100 mg by mouth once a week. Taken on Wednesdays.      Marland Kitchen HYDROmorphone (DILAUDID) 4 MG tablet Take 1 tablet (4 mg total) by mouth every 6 (six) hours as needed for pain.  100 tablet  0  . lidocaine-prilocaine (EMLA) cream Apply 1 application topically as needed (for port-a-cath access.).      Marland Kitchen morphine (MS CONTIN) 30 MG 12 hr tablet Take 1 tablet (30 mg total) by mouth 2 (two) times daily.  60 tablet  0  . ondansetron (ZOFRAN) 8 MG tablet Take 8 mg by mouth every 8 (eight) hours as needed for nausea.      . predniSONE (DELTASONE) 20 MG tablet Take 3 tablets (60 mg total) by mouth daily. Take on days 1-5 of chemotherapy.  45 tablet  0  . prochlorperazine (COMPAZINE) 10 MG tablet Take 1 tablet (10 mg total) by mouth every 6 (six) hours as needed (Nausea or vomiting).  30 tablet  3   No current facility-administered medications for this visit.    ALLERGIES:  is allergic to penicillins; amoxicillin; and hydrocodone.  REVIEW  OF SYSTEMS:  The rest of the 14-point review of system was negative.   Filed Vitals:   06/09/12 0900  BP: 122/86  Pulse: 88  Temp: 97.7 F (36.5 C)  Resp: 18   Wt Readings from Last 3 Encounters:  06/09/12 99 lb 4.8 oz (45.042 kg)  06/04/12 94 lb (42.638 kg)  05/29/12 97 lb 4 oz (44.112 kg)   ECOG Performance status: 0-1  PHYSICAL EXAMINATION:   General:  Thin-appearing man, in no acute distress.  ENT: There was thickening of left tonsil. There was no longer foul odor. Neck was without thyromegaly. Lymphatics: Negative  for supraclavicular or axillary adenopathy. Positive for a 2cm left level II node. Respiratory: lungs were clear bilaterally without wheezing or crackles.  Cardiovascular:  Regular rate and rhythm, S1/S2, without murmur, rub or gallop.  There was no pedal edema.  GI:  abdomen was soft, flat, nontender, nondistended, without organomegaly.  Muscoloskeletal:  no spinal tenderness of palpation of vertebral spine.  Skin exam was without echymosis, petichae.  Neuro exam was nonfocal.  Patient was able to get on and off exam table without assistance.  Gait was normal.  Patient was alert and oriented.  Attention was good.   Language was appropriate.  Mood was normal without depression.  Speech was not pressured.  Thought content was not tangential.       LABORATORY/RADIOLOGY DATA:  Lab Results  Component Value Date   WBC 6.3 06/09/2012   HGB 11.8* 06/09/2012   HCT 37.0* 06/09/2012   PLT 349 Large platelets present 06/09/2012   GLUCOSE 101* 05/25/2012   ALKPHOS 44 05/17/2012   ALT 13 05/17/2012   AST 19 05/17/2012   NA 136 05/25/2012   K 4.3 05/25/2012   CL 101 05/25/2012   CREATININE 0.67 05/25/2012   BUN 5* 05/25/2012   CO2 32 05/25/2012   INR 1.06 06/02/2012    RADIOLOGY:  I personally reviewed the following PET scan and showed the patient and his girlfriend the images.   Nm Pet Image Initial (pi) Skull Base To Thigh  05/28/2012  *RADIOLOGY REPORT*  Clinical Data: Initial treatment strategy for diffuse large B-cell lymphoma. Staging scan.  NUCLEAR MEDICINE PET SKULL BASE TO THIGH  Fasting Blood Glucose:  91  Technique:  19.3 mCi F-18 FDG was injected intravenously. CT data was obtained and used for attenuation correction and anatomic localization only.  (This was not acquired as a diagnostic CT examination.) Additional exam technical data entered on technologist worksheet.  Comparison:  CT of the chest 05/16/2012.  Findings:  Neck: The soft tissues of the nasopharynx and oropharynx appear diffusely thickened,  and are contiguous with the adjacent lymphadenopathy.  This entire region is profoundly hypermetabolic (SUVmax = 28.0).  Multiple smaller isolated cervical lymph nodes are also noted on the left at level 1B (SUVmax = 8.6), on the right at level IIA (SUVmax = 15.2)and on the left at the level IV (SUVmax = 11.2); unfortunately, these nodes are small and difficult to measure on today's noncontrast CT examination.  Chest:  Again noted are areas of apparent mass like and nodular ground-glass attenuation with central cavitation and associated bronchiectasis in the lungs, most pronounced in the left upper lobe.  Other scattered areas of mild ground-glass attenuation with some central cavitation are also noted in the lungs bilaterally, most pronounced in the left lower lobe.  The largest region of cavitation in the left upper lobe demonstrates some associated hypermetabolism (SUVmax = 5.6).  In addition, there  is prominent nodal tissue in the hilar regions bilaterally (which cannot be accurately measured on today's noncontrast CT examination), which demonstrates hypermetabolism (SUVmax = 5.9 - 7.0).  Abdomen/Pelvis:  No abnormal hypermetabolic activity within the liver, pancreas, adrenal glands, or spleen.  No hypermetabolic lymph nodes in the abdomen or pelvis.  Skeleton:  No focal hypermetabolic activity to suggest skeletal metastasis.  IMPRESSION: 1.  Diffuse soft tissue thickening of the oropharynx, nasopharynx and parapharyngeal soft tissues, contiguous with multiple surrounding enlarged cervical lymph nodes and additional cervical lymphadenopathy, as above.  Findings are compatible with the patient's giving clinical diagnosis of large B-cell lymphoma. 2.  The findings in the thorax, including some hypermetabolic bilateral hilar lymphadenopathy as well as patchy multifocal predominant ground-glass attenuation hypermetabolic air space disease with multiple areas of cavitation are most concerning for an atypical  infectious process.  Differential considerations would include atypical organisms such as Mycobacterium tuberculosis, non- tuberculous mycobacterial infection, nocardia, and fungal infections.  Clinical correlation is recommended.  These findings are less likely to represent areas of lymphomatous infiltration in the lungs.   Original Report Authenticated By: Trudie Reed, M.D.    ASSESSMENT AND PLAN:   1.  Stage II DLBCL:   - Uptake in the mediastinum is most likely due to lung abscess with uptake much less than the head/neck lymphoma.  However, cannot definitively rule lymphoma.  - Recommendation:  3 cycles of chemo R-CHOP with Neulasta the day after.  We will restage with PET scan after 3 cycles to assess response.  If very good response, will give 4 cycles total and proceed with consolidative radiation.  If borderline response, will proceed with 6 cycles total. I also recommended prophylactic intrathecal chemo due to head/neck DLBCL which has slightly elevated risk of CNS lymphoma down the line.  Will start this with 2nd cycle of chemo to ensure the does well with the first cycle of R-CHOP. - I advised him on potential side effects of R-CHOP which include but not limited to fatigue, hair loss, mucositis, nausea/vomiting, diarrhea, cytopenia, infection, bleeding, neuropathy, infusion reaction.  He expressed informed understanding and wished to start as recommended.   2.  Lung abscess:  Finishing up Keflex, and Fluconazole per Dr. Orvan Falconer from [facility].   3.  Follow up:  At the [facility] in about 10 days for mid cycle check. I will see him on about 3 weeks prior to the 2nd cycle of chemo.     The length of time of the face-to-face encounter was 25 minutes. More than 50% of time was spent counseling and coordination of care.

## 2012-06-09 NOTE — Progress Notes (Signed)
Visit rescheduled to 06/09/2012.

## 2012-06-09 NOTE — Progress Notes (Signed)
Excellent blood return before during and after Adriamycin infusion. Excellent blood return before and after Vincristine infusion. Pt tolerated without difficulty. Pt denies complaints. Nicholas King

## 2012-06-09 NOTE — Telephone Encounter (Signed)
gv and printed appt sched and avs for pt....emailed MB to add tx...pt aware   °

## 2012-06-10 ENCOUNTER — Ambulatory Visit (HOSPITAL_BASED_OUTPATIENT_CLINIC_OR_DEPARTMENT_OTHER): Payer: Self-pay

## 2012-06-10 VITALS — BP 110/70 | HR 65 | Temp 97.7°F | Resp 18

## 2012-06-10 DIAGNOSIS — C8589 Other specified types of non-Hodgkin lymphoma, extranodal and solid organ sites: Secondary | ICD-10-CM

## 2012-06-10 LAB — CHROMOSOME ANALYSIS, BONE MARROW

## 2012-06-10 MED ORDER — PEGFILGRASTIM INJECTION 6 MG/0.6ML
6.0000 mg | Freq: Once | SUBCUTANEOUS | Status: AC
  Administered 2012-06-10: 6 mg via SUBCUTANEOUS
  Filled 2012-06-10: qty 0.6

## 2012-06-10 NOTE — Patient Instructions (Addendum)

## 2012-06-12 ENCOUNTER — Other Ambulatory Visit: Payer: Self-pay | Admitting: Lab

## 2012-06-12 ENCOUNTER — Ambulatory Visit: Payer: Self-pay | Admitting: Oncology

## 2012-06-12 ENCOUNTER — Ambulatory Visit: Payer: Self-pay | Admitting: Nutrition

## 2012-06-12 NOTE — Progress Notes (Signed)
This is a 42 year old male patient of Dr.Ha diagnosed with B-cell lymphoma.   Past medical history includes GERD, dysphasia, pneumonia, anemia, and cachexia.  Medications include Magic mouthwash, Diflucan, Zofran, Percocet, prednisone, and Compazine.  Labs include glucose 101 and BUN 5 on April 20.  Height: 67 inches. Weight: 99.3 pounds May 5. Usual body weight: 135 pounds 04/17/2012. BMI: 15.55.  Patient presents to nutrition appointment with his sister and girlfriend. Patient is status post first R-CHOP. He reports fatigue. He has had some nausea however nausea medication improves and resolves his nausea. He has developed constipation but is taking a stool softener. Patient is eating very small meals with boost plus one to 2 bottles daily. He also enjoys milkshakes and protein bars. He states he is eating better than he was. He reports difficulty swallowing meats.  Nutrition diagnosis: Malnutrition related to diagnosis of B-cell lymphoma and inadequate oral intake as evidenced by 6% weight loss from usual body weight and BMI of 15.55 as well as depletion of both fat and muscle stores on physical exam.  Intervention: I educated patient to increase oral intake in small amounts throughout the day. I have educated him on foods that are higher in calories and higher in protein. I've encouraged boost +3 times a day between meals. I educated patient on strategies for eating with swallowing difficulties. I encouraged extra gravies and sauces as tolerated. I provided coupons for boost and samples of Unjury protein powder. I answered patient's questions. Teach back method used. Fact sheets and contact information provided.  Monitoring, evaluation, goals: Patient will tolerate increased calories and protein to promote repletion of muscle mass and to minimize further weight loss.  Next visit: Friday, May 23 during chemotherapy.

## 2012-06-16 ENCOUNTER — Ambulatory Visit: Payer: Self-pay

## 2012-06-16 ENCOUNTER — Encounter: Payer: Self-pay | Admitting: Oncology

## 2012-06-16 NOTE — Progress Notes (Signed)
Called the patient and left a message to ck status of medicaid. Patient lives in [place].

## 2012-06-17 ENCOUNTER — Ambulatory Visit: Payer: Self-pay

## 2012-06-18 LAB — EXPECTORATED SPUTUM ASSESSMENT W GRAM STAIN, RFLX TO RESP C

## 2012-06-19 ENCOUNTER — Encounter: Payer: Self-pay | Admitting: Oncology

## 2012-06-19 ENCOUNTER — Ambulatory Visit (HOSPITAL_BASED_OUTPATIENT_CLINIC_OR_DEPARTMENT_OTHER): Payer: Self-pay | Admitting: Oncology

## 2012-06-19 ENCOUNTER — Other Ambulatory Visit (HOSPITAL_BASED_OUTPATIENT_CLINIC_OR_DEPARTMENT_OTHER): Payer: Self-pay

## 2012-06-19 VITALS — BP 108/70 | HR 85 | Temp 97.4°F | Resp 18 | Ht 67.0 in | Wt 92.7 lb

## 2012-06-19 DIAGNOSIS — C8330 Diffuse large B-cell lymphoma, unspecified site: Secondary | ICD-10-CM

## 2012-06-19 DIAGNOSIS — C8589 Other specified types of non-Hodgkin lymphoma, extranodal and solid organ sites: Secondary | ICD-10-CM

## 2012-06-19 DIAGNOSIS — J36 Peritonsillar abscess: Secondary | ICD-10-CM

## 2012-06-19 DIAGNOSIS — K1370 Unspecified lesions of oral mucosa: Secondary | ICD-10-CM

## 2012-06-19 DIAGNOSIS — K1379 Other lesions of oral mucosa: Secondary | ICD-10-CM

## 2012-06-19 LAB — CBC WITH DIFFERENTIAL/PLATELET
BASO%: 1.3 % (ref 0.0–2.0)
EOS%: 0.2 % (ref 0.0–7.0)
Eosinophils Absolute: 0 10*3/uL (ref 0.0–0.5)
MCH: 25.7 pg — ABNORMAL LOW (ref 27.2–33.4)
MCV: 77.3 fL — ABNORMAL LOW (ref 79.3–98.0)
MONO%: 15.4 % — ABNORMAL HIGH (ref 0.0–14.0)
NEUT#: 2 10*3/uL (ref 1.5–6.5)
RBC: 4.24 10*6/uL (ref 4.20–5.82)
RDW: 16.2 % — ABNORMAL HIGH (ref 11.0–14.6)

## 2012-06-19 LAB — COMPREHENSIVE METABOLIC PANEL (CC13)
ALT: 13 U/L (ref 0–55)
AST: 14 U/L (ref 5–34)
Albumin: 3.1 g/dL — ABNORMAL LOW (ref 3.5–5.0)
Alkaline Phosphatase: 67 U/L (ref 40–150)
Potassium: 3.6 mEq/L (ref 3.5–5.1)
Sodium: 139 mEq/L (ref 136–145)
Total Protein: 7.2 g/dL (ref 6.4–8.3)

## 2012-06-19 MED ORDER — FLUCONAZOLE 100 MG PO TABS: 100.0000 mg | ORAL_TABLET | Freq: Every day | ORAL | Status: DC

## 2012-06-19 MED ORDER — PROCHLORPERAZINE MALEATE 10 MG PO TABS: 10.0000 mg | ORAL_TABLET | Freq: Four times a day (QID) | ORAL | Status: DC | PRN

## 2012-06-19 MED ORDER — OXYCODONE-ACETAMINOPHEN 7.5-325 MG PO TABS: 1.0000 | ORAL_TABLET | ORAL | Status: DC | PRN

## 2012-06-19 NOTE — Progress Notes (Signed)
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   PAST THERAPY:  Biopsy only   CURRENT THERAPY: R-CHOP started on 06/09/12; every 3 weeks; for goal of chemo about 4 cycles depending on follow up interval PET scan; Neulasta on day # 2 of each chemo cycle; possible consolidative radiation; and intrathecal prophylactic chemo with cycle #2 onward.   INTERVAL HISTORY: Nicholas King 42 y.o. male returns for regular follow up.  He has received his first cycle of chemotherapy. States that he did well initially, but developed nausea about 3-4 days after his first cycle of chemotherapy. He has been using an anti-emetics which have been working well for him. He no longer has fever, chest pain, productive cough.  Severe throat pain seems better with MS Contin and Dilaudid for breakthrough pain. However, he states that he is having a pins and needles sensation to his bilateral thighs and thinks that the oxycodone he had previously worked better than the Dilaudid for breakthrough pain. States that he has tried oxycodone that was a prescription of a family member. He had relief with the oxycodone. He has been able to swallow by mouth, but is losing weight still.  He is able to perform chores around the house.    Patient denies fever, anorexia, weight loss, fatigue, headache, visual changes, confusion, drenching night sweats, palpable lymph node swelling, mucositis, odynophagia, dysphagia, nausea vomiting, jaundice, chest pain, palpitation, shortness of breath, dyspnea on exertion, productive cough, gum bleeding, epistaxis, hematemesis, hemoptysis, abdominal pain, abdominal swelling, early satiety, melena, hematochezia, hematuria, skin rash, spontaneous bleeding, joint swelling, joint pain, heat or cold intolerance, bowel bladder incontinence, back pain, focal motor weakness, paresthesia, depression.    Past Medical  History  Diagnosis Date  . GERD (gastroesophageal reflux disease)   . Cancer   . Lymphoma   . Dysphagia, unspecified 05/16/2012  . Pneumonia     April 2014    Past Surgical History  Procedure Laterality Date  . No past surgeries    . Direct laryngoscopy Left 05/08/2012    Procedure: DIRECT LARYNGOSCOPY with biopsy left tonsil;  Surgeon: Suzanna Obey, MD;  Location[facility] OR;  Service: ENT;  Laterality: Left;  . Right eyebrown bone      Current Outpatient Prescriptions  Medication Sig Dispense Refill  . Alum & Mag Hydroxide-Simeth (MAGIC MOUTHWASH W/LIDOCAINE) SOLN Take 15 mLs by mouth 4 (four) times daily as needed (for mouth pain.).      Marland Kitchen fluconazole (DIFLUCAN) 100 MG tablet Take 1 tablet (100 mg total) by mouth daily.  10 tablet  0  . lidocaine-prilocaine (EMLA) cream Apply 1 application topically as needed (for port-a-cath access.).      Marland Kitchen morphine (MS CONTIN) 30 MG 12 hr tablet Take 1 tablet (30 mg total) by mouth 2 (two) times daily.  60 tablet  0  . nicotine (NICODERM CQ) 14 mg/24hr patch Place 1 patch onto the skin daily.  28 patch  1  . ondansetron (ZOFRAN) 8 MG tablet Take 8 mg by mouth every 8 (eight) hours as needed for nausea.      Marland Kitchen oxyCODONE-acetaminophen (PERCOCET) 7.5-325 MG per tablet Take 1 tablet by mouth every 4 (four) hours as needed for pain.  60 tablet  0  . predniSONE (DELTASONE) 20 MG tablet Take 3 tablets (60 mg total) by mouth daily. Take on days 1-5 of chemotherapy.  45 tablet  0  .  prochlorperazine (COMPAZINE) 10 MG tablet Take 1 tablet (10 mg total) by mouth every 6 (six) hours as needed (Nausea or vomiting).  30 tablet  3   No current facility-administered medications for this visit.    ALLERGIES:  is allergic to penicillins; amoxicillin; and hydrocodone.  REVIEW OF SYSTEMS:  The rest of the 14-point review of system was negative.   Filed Vitals:   06/19/12 1135  BP: 108/70  Pulse: 85  Temp: 97.4 F (36.3 C)  Resp: 18   Wt Readings from Last 3  Encounters:  06/19/12 92 lb 11.2 oz (42.048 kg)  06/09/12 99 lb 4.8 oz (45.042 kg)  06/04/12 94 lb (42.638 kg)   ECOG Performance status: 0-1  PHYSICAL EXAMINATION:   General:  Thin-appearing man, in no acute distress.  ENT: He has thrush in the posterior pharynx. There was thickening of left tonsil. There was no longer foul odor. Neck was without thyromegaly. Lymphatics: Negative for supraclavicular or axillary adenopathy. Respiratory: lungs were clear bilaterally without wheezing or crackles.  Cardiovascular:  Regular rate and rhythm, S1/S2, without murmur, rub or gallop.  There was no pedal edema.  GI:  abdomen was soft, flat, nontender, nondistended, without organomegaly.  Muscoloskeletal:  no spinal tenderness of palpation of vertebral spine.  Skin exam was without echymosis, petichae.  Neuro exam was nonfocal.  Patient was able to get on and off exam table without assistance.  Gait was normal.  Patient was alert and oriented.  Attention was good.   Language was appropriate.  Mood was normal without depression.  Speech was not pressured.  Thought content was not tangential.     LABORATORY/RADIOLOGY DATA:  Lab Results  Component Value Date   WBC 3.4* 06/19/2012   HGB 10.9* 06/19/2012   HCT 32.8* 06/19/2012   PLT 208 06/19/2012   GLUCOSE 91 06/19/2012   ALKPHOS 67 06/19/2012   ALT 13 06/19/2012   AST 14 06/19/2012   NA 139 06/19/2012   K 3.6 06/19/2012   CL 98 06/19/2012   CREATININE 0.8 06/19/2012   BUN 7.2 06/19/2012   CO2 30* 06/19/2012   INR 1.06 06/02/2012    ASSESSMENT AND PLAN:   1.  Stage II DLBCL:   - Uptake in the mediastinum is most likely due to lung abscess with uptake much less than the head/neck lymphoma.  However, cannot definitively rule lymphoma.  - He is status post one cycle of R-CHOP with grade 1 anorexia and weight loss. Plan is for 3 cycles of chemo R-CHOP with Neulasta the day after.  We will restage with PET scan after 3 cycles to assess response.  If very good  response, will give 4 cycles total and proceed with consolidative radiation.  If borderline response, will proceed with 6 cycles total. I also recommended prophylactic intrathecal chemo due to head/neck DLBCL which has slightly elevated risk of CNS lymphoma down the line.  Will start this with 2nd cycle of chemo.  2. Lung abscess: Status post Keflex, and Fluconazole per Dr. Orvan Falconer from [facility].   3. Protein calorie malnutrition: He is followed by Vernell Leep. I have encouraged him to drink 2-3 cans of Ensure or boost per day.  4. throat pain: Improved with MS Contin 30 mg twice a day and Dilaudid. However, he thinks the Dilaudid is not working as well for him and prefers to go back on oxycodone. I have instructed him to stop the Dilaudid and have given him a new prescription for oxycodone/APAP 7.5/325 mg.  5. oral candidiasis: I have prescribed fluconazole 100 mg daily for 10 days.  6.  Follow up:  5/23 prior to cycle 2.     The length of time of the face-to-face encounter was 30 minutes. More than 50% of time was spent counseling and coordination of care.

## 2012-06-24 ENCOUNTER — Encounter: Payer: Self-pay | Admitting: Oncology

## 2012-06-24 ENCOUNTER — Telehealth: Payer: Self-pay | Admitting: *Deleted

## 2012-06-24 ENCOUNTER — Ambulatory Visit: Payer: Self-pay | Admitting: Internal Medicine

## 2012-06-24 NOTE — Telephone Encounter (Signed)
Message left requesting pt call [facility] for an appt.

## 2012-06-24 NOTE — Progress Notes (Signed)
Faxed rituxan patient assistance form to [place].

## 2012-06-27 ENCOUNTER — Ambulatory Visit (HOSPITAL_BASED_OUTPATIENT_CLINIC_OR_DEPARTMENT_OTHER): Payer: Self-pay

## 2012-06-27 ENCOUNTER — Ambulatory Visit: Payer: Self-pay | Admitting: Nutrition

## 2012-06-27 ENCOUNTER — Telehealth: Payer: Self-pay | Admitting: Oncology

## 2012-06-27 ENCOUNTER — Other Ambulatory Visit (HOSPITAL_BASED_OUTPATIENT_CLINIC_OR_DEPARTMENT_OTHER): Payer: Self-pay | Admitting: Lab

## 2012-06-27 ENCOUNTER — Ambulatory Visit (HOSPITAL_BASED_OUTPATIENT_CLINIC_OR_DEPARTMENT_OTHER): Payer: Self-pay | Admitting: Oncology

## 2012-06-27 VITALS — BP 84/51 | HR 58 | Temp 98.0°F | Resp 15 | Ht 67.0 in | Wt 97.4 lb

## 2012-06-27 VITALS — BP 93/55 | HR 58 | Temp 98.7°F | Resp 16

## 2012-06-27 DIAGNOSIS — Z5112 Encounter for antineoplastic immunotherapy: Secondary | ICD-10-CM

## 2012-06-27 DIAGNOSIS — C8330 Diffuse large B-cell lymphoma, unspecified site: Secondary | ICD-10-CM

## 2012-06-27 DIAGNOSIS — K1370 Unspecified lesions of oral mucosa: Secondary | ICD-10-CM

## 2012-06-27 DIAGNOSIS — C8589 Other specified types of non-Hodgkin lymphoma, extranodal and solid organ sites: Secondary | ICD-10-CM

## 2012-06-27 DIAGNOSIS — Z5111 Encounter for antineoplastic chemotherapy: Secondary | ICD-10-CM

## 2012-06-27 LAB — COMPREHENSIVE METABOLIC PANEL (CC13)
ALT: 7 U/L (ref 0–55)
AST: 10 U/L (ref 5–34)
Albumin: 2.5 g/dL — ABNORMAL LOW (ref 3.5–5.0)
Alkaline Phosphatase: 65 U/L (ref 40–150)
BUN: 7.2 mg/dL (ref 7.0–26.0)
CO2: 26 meq/L (ref 22–29)
Calcium: 7.7 mg/dL — ABNORMAL LOW (ref 8.4–10.4)
Chloride: 108 meq/L — ABNORMAL HIGH (ref 98–107)
Creatinine: 0.7 mg/dL (ref 0.7–1.3)
Glucose: 104 mg/dL — ABNORMAL HIGH (ref 70–99)
Potassium: 3.5 meq/L (ref 3.5–5.1)
Sodium: 140 meq/L (ref 136–145)
Total Bilirubin: <0.2 mg/dL (ref 0.20–1.20)
Total Protein: 5.4 g/dL — ABNORMAL LOW (ref 6.4–8.3)

## 2012-06-27 LAB — CBC WITH DIFFERENTIAL/PLATELET
BASO%: 0.2 % (ref 0.0–2.0)
LYMPH%: 13.6 % — ABNORMAL LOW (ref 14.0–49.0)
MCHC: 31.5 g/dL — ABNORMAL LOW (ref 32.0–36.0)
MCV: 81.7 fL (ref 79.3–98.0)
MONO%: 7.4 % (ref 0.0–14.0)
NEUT%: 78.6 % — ABNORMAL HIGH (ref 39.0–75.0)
Platelets: 303 10*3/uL (ref 140–400)
RBC: 3.77 10*6/uL — ABNORMAL LOW (ref 4.20–5.82)

## 2012-06-27 MED ORDER — SODIUM CHLORIDE 0.9 % IV SOLN
375.0000 mg/m2 | Freq: Once | INTRAVENOUS | Status: AC
  Administered 2012-06-27: 500 mg via INTRAVENOUS
  Filled 2012-06-27: qty 50

## 2012-06-27 MED ORDER — DEXAMETHASONE SODIUM PHOSPHATE 20 MG/5ML IJ SOLN
20.0000 mg | Freq: Once | INTRAMUSCULAR | Status: AC
  Administered 2012-06-27: 20 mg via INTRAVENOUS

## 2012-06-27 MED ORDER — ACETAMINOPHEN 325 MG PO TABS
650.0000 mg | ORAL_TABLET | Freq: Once | ORAL | Status: AC
  Administered 2012-06-27: 650 mg via ORAL

## 2012-06-27 MED ORDER — SODIUM CHLORIDE 0.9 % IJ SOLN
10.0000 mL | INTRAMUSCULAR | Status: DC | PRN
  Administered 2012-06-27: 10 mL
  Filled 2012-06-27: qty 10

## 2012-06-27 MED ORDER — SODIUM CHLORIDE 0.9 % IV SOLN
Freq: Once | INTRAVENOUS | Status: AC
  Administered 2012-06-27: 11:00:00 via INTRAVENOUS

## 2012-06-27 MED ORDER — VINCRISTINE SULFATE CHEMO INJECTION 1 MG/ML
2.0000 mg | Freq: Once | INTRAVENOUS | Status: AC
  Administered 2012-06-27: 2 mg via INTRAVENOUS
  Filled 2012-06-27: qty 2

## 2012-06-27 MED ORDER — DOXORUBICIN HCL CHEMO IV INJECTION 2 MG/ML
50.0000 mg/m2 | Freq: Once | INTRAVENOUS | Status: AC
  Administered 2012-06-27: 72 mg via INTRAVENOUS
  Filled 2012-06-27: qty 36

## 2012-06-27 MED ORDER — SODIUM CHLORIDE 0.9 % IV SOLN
750.0000 mg/m2 | Freq: Once | INTRAVENOUS | Status: AC
  Administered 2012-06-27: 1080 mg via INTRAVENOUS
  Filled 2012-06-27: qty 54

## 2012-06-27 MED ORDER — ONDANSETRON 16 MG/50ML IVPB (CHCC)
16.0000 mg | Freq: Once | INTRAVENOUS | Status: AC
  Administered 2012-06-27: 16 mg via INTRAVENOUS

## 2012-06-27 MED ORDER — DIPHENHYDRAMINE HCL 25 MG PO CAPS
50.0000 mg | ORAL_CAPSULE | Freq: Once | ORAL | Status: AC
  Administered 2012-06-27: 50 mg via ORAL

## 2012-06-27 MED ORDER — HEPARIN SOD (PORK) LOCK FLUSH 100 UNIT/ML IV SOLN
500.0000 [IU] | Freq: Once | INTRAVENOUS | Status: AC | PRN
  Administered 2012-06-27: 500 [IU]
  Filled 2012-06-27: qty 5

## 2012-06-27 NOTE — Progress Notes (Signed)
I attempted to followup with patient today while he was in chemotherapy. Patient was sleeping soundly and did not awaken to his name being called. I attempted followup twice. Chart reviewed and it is noted that the patient has lost 2 more pounds; weight documented as 97.4 pounds May 23rd which is decreased from 99.3 pounds May 5. Diagnosis of malnutrition continues. I will continue to work with patient in followup to minimize further weight loss.

## 2012-06-27 NOTE — Telephone Encounter (Signed)
[facility] Boatman [facility] called and asked that I give pt a schedule for lumbar puncture inj's due pt is here in tx and they cannot reach him. Copy of schedule taken to pt in inf re inj appt @ [facility].

## 2012-06-27 NOTE — Patient Instructions (Addendum)
[facility] Discharge Instructions for Patients Receiving Chemotherapy  Today you received the following chemotherapy agents: Rituxin, doxorubicin, cytoxan, vincristine  To help prevent nausea and vomiting after your treatment, we encourage you to take your nausea medication as often as prescribed..   If you develop nausea and vomiting that is not controlled by your nausea medication, call the clinic.  BELOW ARE SYMPTOMS THAT SHOULD BE REPORTED IMMEDIATELY:  *FEVER GREATER THAN 100.5 F  *CHILLS WITH OR WITHOUT FEVER  NAUSEA AND VOMITING THAT IS NOT CONTROLLED WITH YOUR NAUSEA MEDICATION  *UNUSUAL SHORTNESS OF BREATH  *UNUSUAL BRUISING OR BLEEDING  TENDERNESS IN MOUTH AND THROAT WITH OR WITHOUT PRESENCE OF ULCERS  *URINARY PROBLEMS  *BOWEL PROBLEMS  UNUSUAL RASH Items with * indicate a potential emergency and should be followed up as soon as possible.   The clinic phone number is [phone].

## 2012-06-28 ENCOUNTER — Ambulatory Visit: Payer: Self-pay

## 2012-06-28 ENCOUNTER — Telehealth: Payer: Self-pay | Admitting: Medical Oncology

## 2012-06-28 NOTE — Telephone Encounter (Signed)
Patient with scheduled neulasta injection appt today at 0830 did not show for appt. Called patient at home, generic vm with no name anoucement on vm, did not feel comfortable leaving message d/t privacy concerns. Called patient on cell phone provided, no ability to leave message d/t answering machine states message box full.   Mssg sent to Dr Gaylyn Rong

## 2012-06-29 NOTE — Progress Notes (Signed)
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   PAST THERAPY:  Biopsy only   CURRENT THERAPY: started chemo R-CHOP on 06/09/2012; q3 weeks; for goal of chemo at least 4 cycles depending on follow up interval PET scan; Neulasta on day # 2 of each chemo cycle; possible consolidative radiation; and intrathecal prophylactic chemo with cycle #2 onward.   INTERVAL HISTORY: Nicholas King 42 y.o. male returns for regular follow up with his girlfriend.  He reports significant improvement in his mouth sore from the lymphoma.  He is able to eat now.  He does not take MS Contin and only takes Percocet about 3-4x/day.  He no longer feels any palpable neck node.  He denied fever, mucositis, nausea/vomiting, SOB, Chest pain, abd pain, neuropathy, constipation, skin rash, bleeding symptoms.  The rest of the 14-point review of system was negative.    Past Medical History  Diagnosis Date  . GERD (gastroesophageal reflux disease)   . Cancer   . Lymphoma   . Dysphagia, unspecified 05/16/2012  . Pneumonia     April 2014    Past Surgical History  Procedure Laterality Date  . No past surgeries    . Direct laryngoscopy Left 05/08/2012    Procedure: DIRECT LARYNGOSCOPY with biopsy left tonsil;  Surgeon: Suzanna Obey, MD;  Location: [facility] OR;  Service: ENT;  Laterality: Left;  . Right eyebrown bone      Current Outpatient Prescriptions  Medication Sig Dispense Refill  . Alum & Mag Hydroxide-Simeth (MAGIC MOUTHWASH W/LIDOCAINE) SOLN Take 15 mLs by mouth 4 (four) times daily as needed (for mouth pain.).      Marland Kitchen fluconazole (DIFLUCAN) 100 MG tablet Take 1 tablet (100 mg total) by mouth daily.  10 tablet  0  . lidocaine-prilocaine (EMLA) cream Apply 1 application topically as needed (for port-a-cath access.).      Marland Kitchen morphine (MS CONTIN) 30 MG 12 hr tablet Take 1  tablet (30 mg total) by mouth 2 (two) times daily.  60 tablet  0  . nicotine (NICODERM CQ) 14 mg/24hr patch Place 1 patch onto the skin daily.  28 patch  1  . ondansetron (ZOFRAN) 8 MG tablet Take 8 mg by mouth every 8 (eight) hours as needed for nausea.      Marland Kitchen oxyCODONE-acetaminophen (PERCOCET) 7.5-325 MG per tablet Take 1 tablet by mouth every 4 (four) hours as needed for pain.  60 tablet  0  . predniSONE (DELTASONE) 20 MG tablet Take 3 tablets (60 mg total) by mouth daily. Take on days 1-5 of chemotherapy.  45 tablet  0  . prochlorperazine (COMPAZINE) 10 MG tablet Take 1 tablet (10 mg total) by mouth every 6 (six) hours as needed (Nausea or vomiting).  30 tablet  3   No current facility-administered medications for this visit.    ALLERGIES:  is allergic to penicillins; amoxicillin; and hydrocodone.  REVIEW OF SYSTEMS:  The rest of the 14-point review of system was negative.   Filed Vitals:   06/27/12 1021  BP: 84/51  Pulse: 58  Temp: 98 F (36.7 C)  Resp: 15   Wt Readings from Last 3 Encounters:  06/27/12 97 lb 6.4 oz (44.18 kg)  06/19/12 92 lb 11.2 oz (42.048 kg)  06/09/12 99 lb 4.8 oz (45.042 kg)   ECOG Performance status: 0-1  PHYSICAL EXAMINATION:  General:  Thin-appearing man, in no acute distress.  ENT: There was no tonsilar mass. There was no longer foul odor. Neck was without thyromegaly. Lymphatics: Negative for cervical,  supraclavicular or axillary adenopathy.  Respiratory: lungs were clear bilaterally without wheezing or crackles.  Cardiovascular:  Regular rate and rhythm, S1/S2, without murmur, rub or gallop.  There was no pedal edema.  GI:  abdomen was soft, flat, nontender, nondistended, without organomegaly.  Muscoloskeletal:  no spinal tenderness of palpation of vertebral spine.  Skin exam was without echymosis, petichae.  Neuro exam was nonfocal.  Patient was able to get on and off exam table without assistance.  Gait was normal.  Patient was alert and oriented.   He was a little drowsy but claimed that he did not sleep much last night. He was able to answer questions appropriately.   Language was appropriate.  Mood was normal without depression.  Speech was not pressured.  Thought content was not tangential.        LABORATORY/RADIOLOGY DATA:  Lab Results  Component Value Date   WBC 9.1 06/27/2012   HGB 9.7* 06/27/2012   HCT 30.8* 06/27/2012   PLT 303 06/27/2012   GLUCOSE 104* 06/27/2012   ALKPHOS 65 06/27/2012   ALT 7 06/27/2012   AST 10 06/27/2012   NA 140 06/27/2012   K 3.5 06/27/2012   CL 108* 06/27/2012   CREATININE 0.7 06/27/2012   BUN 7.2 06/27/2012   CO2 26 06/27/2012   INR 1.06 06/02/2012    ASSESSMENT AND PLAN:   1.  Stage II DLBCL:   - Doing well after 1 cycle of R-CHOP with clinical response.   - He has no dose limiting toxicity. - I recommended to proceed with the 2nd out of 4 cycles without dose modification.  I also recommended LP for intrathecal chemo next week to decrease risk of intracranial lymphoma in the future.  He expressed informed understanding and wished to proceed.  - I advised him to take Prednisone daily from days 1-5 of each cycle of chemo.  I advised him to return to clinic the day after chemo to receive Neulasta to decrease risk of infection.   2.  Mouth sore:  From lymphoma.  Much improved.  I advised him to stop taking MS Contin and taper down on Percocet so that he can bee completely off of pain med in about 1-2 months.   3.  Follow up:  At the [facility] in about 3 weeks for cycle #3 out of 4 of chemo.     The length of time of the face-to-face encounter was 25 minutes. More than 50% of time was spent counseling and coordination of care.

## 2012-06-30 LAB — AFB CULTURE WITH SMEAR (NOT AT ARMC): Acid Fast Smear: NONE SEEN

## 2012-07-01 ENCOUNTER — Ambulatory Visit (HOSPITAL_COMMUNITY)
Admission: RE | Admit: 2012-07-01 | Discharge: 2012-07-01 | Disposition: A | Discharge status: Home / Self Care | Payer: Medicaid Other | Source: Ambulatory Visit

## 2012-07-01 ENCOUNTER — Other Ambulatory Visit: Payer: Self-pay | Admitting: *Deleted

## 2012-07-01 ENCOUNTER — Other Ambulatory Visit: Payer: Self-pay | Admitting: Oncology

## 2012-07-01 ENCOUNTER — Other Ambulatory Visit (HOSPITAL_COMMUNITY): Payer: Self-pay

## 2012-07-01 ENCOUNTER — Ambulatory Visit (HOSPITAL_COMMUNITY)
Admission: RE | Admit: 2012-07-01 | Discharge: 2012-07-01 | Disposition: A | Discharge status: Home / Self Care | Payer: Medicaid Other | Source: Ambulatory Visit | Attending: Oncology | Admitting: Oncology

## 2012-07-01 ENCOUNTER — Ambulatory Visit (HOSPITAL_BASED_OUTPATIENT_CLINIC_OR_DEPARTMENT_OTHER): Payer: Self-pay

## 2012-07-01 ENCOUNTER — Encounter (HOSPITAL_COMMUNITY): Payer: Self-pay

## 2012-07-01 ENCOUNTER — Other Ambulatory Visit (HOSPITAL_COMMUNITY): Payer: Self-pay | Admitting: Radiology

## 2012-07-01 VITALS — BP 105/70 | HR 94 | Temp 97.8°F

## 2012-07-01 VITALS — BP 80/52 | HR 89 | Temp 97.2°F | Resp 16 | Ht 67.0 in | Wt 98.4 lb

## 2012-07-01 DIAGNOSIS — C8589 Other specified types of non-Hodgkin lymphoma, extranodal and solid organ sites: Secondary | ICD-10-CM

## 2012-07-01 DIAGNOSIS — C8330 Diffuse large B-cell lymphoma, unspecified site: Secondary | ICD-10-CM

## 2012-07-01 DIAGNOSIS — Z79899 Other long term (current) drug therapy: Secondary | ICD-10-CM | POA: Insufficient documentation

## 2012-07-01 DIAGNOSIS — Z5189 Encounter for other specified aftercare: Secondary | ICD-10-CM

## 2012-07-01 MED ORDER — PEGFILGRASTIM INJECTION 6 MG/0.6ML
6.0000 mg | Freq: Once | SUBCUTANEOUS | Status: AC
  Administered 2012-07-01: 6 mg via SUBCUTANEOUS
  Filled 2012-07-01: qty 0.6

## 2012-07-01 MED ORDER — HEPARIN SOD (PORK) LOCK FLUSH 100 UNIT/ML IV SOLN: 500.0000 [IU] | Freq: Once | INTRAVENOUS | Status: DC

## 2012-07-01 MED ORDER — OXYCODONE-ACETAMINOPHEN 7.5-325 MG PO TABS: 1.0000 | ORAL_TABLET | Freq: Four times a day (QID) | ORAL | Status: DC | PRN

## 2012-07-01 MED ORDER — SODIUM CHLORIDE 0.9 % IJ SOLN
Freq: Once | INTRAMUSCULAR | Status: AC
  Administered 2012-07-01: 12:00:00 via INTRATHECAL
  Filled 2012-07-01: qty 0.48

## 2012-07-01 MED ORDER — SODIUM CHLORIDE 0.9 % IJ SOLN: Freq: Once | INTRAMUSCULAR | Status: DC

## 2012-07-01 MED ORDER — HEPARIN SOD (PORK) LOCK FLUSH 100 UNIT/ML IV SOLN
500.0000 [IU] | Freq: Once | INTRAVENOUS | Status: AC
  Administered 2012-07-01: 500 [IU] via INTRAVENOUS
  Filled 2012-07-01: qty 5

## 2012-07-01 MED ORDER — OXYCODONE-ACETAMINOPHEN 7.5-325 MG PO TABS: 1.0000 | ORAL_TABLET | ORAL | Status: DC | PRN

## 2012-07-01 NOTE — Telephone Encounter (Signed)
VM left by Lupita Leash states she just missed a call from our office and assumes it is about the refill request.   Called back and left her a VM that I was returning her call and instructed to bring pt over to our office after his procedure for his Neulasta Injection.  I had also asked RN in Short Stay to please instruct pt to come over after his procedure for his Neulasta injection.

## 2012-07-01 NOTE — Procedures (Signed)
FLUOROSCOPICALLY GUIDED LUMBAR PUNCTURE FOR INTRATHECAL CHEMOTHERAPY  Technique:  Informed consent was obtained from the patient prior to the procedure, including potential complications of headache, allergy, and pain.   With the patient prone, the lower back was prepped with Betadine.  1% Lidocaine was used for local anesthesia.  Lumbar puncture was performed at the [L3-4] level using a [20] gauge needle with return of [clear] CSF.  [5 ml]  of  [methotrexate] was injected into the subarachnoid space.  The patient tolerated the procedure well without apparent complication. IMPRESSION: [Successful fluoroscopically guided lumbar puncture for intrathecal methotrexate injection.]

## 2012-07-01 NOTE — Telephone Encounter (Signed)
Walk in form completed requesting refill on Percocet 7.5-325 mg.  Here today for spinal procedure.

## 2012-07-01 NOTE — Progress Notes (Signed)
Rx for Percocet given to pt.

## 2012-07-03 ENCOUNTER — Ambulatory Visit
Admission: RE | Admit: 2012-07-03 | Discharge: 2012-07-03 | Disposition: A | Discharge status: Home / Self Care | Payer: Self-pay | Source: Ambulatory Visit | Attending: Internal Medicine | Admitting: Internal Medicine

## 2012-07-03 ENCOUNTER — Ambulatory Visit (INDEPENDENT_AMBULATORY_CARE_PROVIDER_SITE_OTHER): Payer: Self-pay | Admitting: Internal Medicine

## 2012-07-03 ENCOUNTER — Encounter: Payer: Self-pay | Admitting: Internal Medicine

## 2012-07-03 VITALS — BP 111/67 | HR 66 | Temp 97.2°F | Ht 67.0 in | Wt 99.5 lb

## 2012-07-03 DIAGNOSIS — G971 Other reaction to spinal and lumbar puncture: Secondary | ICD-10-CM

## 2012-07-03 DIAGNOSIS — J984 Other disorders of lung: Secondary | ICD-10-CM

## 2012-07-03 HISTORY — DX: Other reaction to spinal and lumbar puncture: G97.1

## 2012-07-03 NOTE — Progress Notes (Signed)
Patient ID: Nicholas King, male   DOB: 1970/03/31, 42 y.o.   MRN: [id number]         [facility]  Patient Active Problem List   Diagnosis Date Noted  . Staph Aureus Pneumonia with Cavitary lesion of lung, fever and chest pain 05/16/2012    Priority: High  . Diffuse large B cell lymphoma 05/16/2012    Priority: Medium  . 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 Adverse drug reaction 05/17/2012  . Rash and nonspecific skin eruption 05/17/2012  . Cachexia 05/16/2012  . Dysphagia, unspecified 05/16/2012  . Chest pain 05/16/2012  . Severe malnutrition in the context of chronic illness 05/16/2012  . GERD (gastroesophageal reflux disease) 05/16/2012  . Thyroid nodule 05/16/2012  . Anemia 05/16/2012  . Hyperglycemia 05/16/2012  . Cigarette smoker 05/16/2012  . Other malignant lymphomas, unspecified site, extranodal and solid organ sites 05/15/2012    Patient's Medications  New Prescriptions   No medications on file  Previous Medications   ALUM & MAG HYDROXIDE-SIMETH (MAGIC MOUTHWASH W/LIDOCAINE) SOLN    Take 15 mLs by mouth 4 (four) times daily as needed (for mouth pain.).   FLUCONAZOLE (DIFLUCAN) 100 MG TABLET    Take 1 tablet (100 mg total) by mouth daily.   LIDOCAINE-PRILOCAINE (EMLA) CREAM    Apply 1 application topically as needed (for port-a-cath access.).   MORPHINE (MS CONTIN) 30 MG 12 HR TABLET    Take 1 tablet (30 mg total) by mouth 2 (two) times daily.   NICOTINE (NICODERM CQ) 14 MG/24HR PATCH    Place 1 patch onto the skin daily.   ONDANSETRON (ZOFRAN) 8 MG TABLET    Take 8 mg by mouth every 8 (eight) hours as needed for nausea.   OXYCODONE-ACETAMINOPHEN (PERCOCET) 7.5-325 MG PER TABLET    Take 1 tablet by mouth every 6 (six) hours as needed for pain.   PREDNISONE (DELTASONE) 20 MG TABLET    Take 3 tablets (60 mg total) by mouth daily. Take on days 1-5 of chemotherapy.   PROCHLORPERAZINE (COMPAZINE) 10 MG TABLET    Take 1 tablet (10 mg  total) by mouth every 6 (six) hours as needed (Nausea or vomiting).  Modified Medications   No medications on file  Discontinued Medications   No medications on file    Subjective: Nicholas King is in for his routine followup visit. He completed 4 weeks of antibiotic therapy with cephalexin around May 8 for his cavitary left upper lobe pneumonia do to methicillin sensitive staph aureus. He has not had any further fever, cough, shortness of breath or chest pain.  He is undergoing chemotherapy for his lymphoma and has completed 2 cycles. He had a routine lumbar puncture 2 days ago and has had a bad headache ever since. It occurs when he is standing but goes away when he lays down.  Review of Systems: Pertinent items are noted in HPI.  Past Medical History  Diagnosis Date  . GERD (gastroesophageal reflux disease)   . Cancer   . Lymphoma   . Dysphagia, unspecified(787.20) 05/16/2012  . Pneumonia     April 2014  . Post lumbar puncture headache 07/03/2012    History  Substance Use Topics  . Smoking status: Current Every Day Smoker -- 0.50 packs/day for 25 years    Types: Cigarettes  . Smokeless tobacco: Not on file  . Alcohol Use: No    Family History  Problem Relation Age of Onset  . Cancer Father  lung  . Diabetes Father   . Cancer Maternal Aunt     gyn cancer, pancreas  . Cancer Paternal Aunt     pancreas cancer  . Cancer Paternal Uncle     unk    Allergies  Allergen Reactions  . Penicillins Hives  . Amoxicillin Rash  . Hydrocodone Rash    Objective: Temp: 97.2 F (36.2 C) (05/29 1019) Temp src: Oral (05/29 1019) BP: 111/67 mmHg (05/29 1019) Pulse Rate: 66 (05/29 1019)  General: He is in good spirits. He is laying on exam table Skin: No rash. Right upper chest Port-A-Cath site appears normal Oral: Thrush or other oral pharyngeal lesions. His teeth are in poor condition Lungs: Clear Cor: Regular S1 and S2 no murmurs   Assessment: I suspect that his  pneumonia has been cured. I will observe off of antibiotics and repeat a chest x-ray to establish a new baseline.  Plan: 1. Continue observation off of antibiotics 2. Repeat 2 view chest x-ray 3. Followup here as needed   Cliffton Asters, MD [facility] [phone] pager   [phone] cell 07/03/2012, 10:42 AM

## 2012-07-04 ENCOUNTER — Other Ambulatory Visit: Payer: Self-pay | Admitting: Certified Registered Nurse Anesthetist

## 2012-07-04 ENCOUNTER — Telehealth: Payer: Self-pay | Admitting: *Deleted

## 2012-07-04 NOTE — Telephone Encounter (Signed)
Please also advise him to refrain from Zofran for now to see if his headache improves.

## 2012-07-04 NOTE — Telephone Encounter (Signed)
Call from pt's girlfriend reports pt c/o severe headaches every time he sits or stands up. He is at home laying down, but unable to get up w/o headache.  Called Radiology and left message for Dr. [name] to return call for orders.

## 2012-07-04 NOTE — Telephone Encounter (Signed)
Spoke w/ Dr. [name] and he instructs for pt to lay flat w/ head same level as tailbone x 24 hrs.  Pt may lay on back, side or abdomen as long as head is down same level as spine.  Pt may get up to use bathroom only and to eat/drink.  Pt needs to stay well hydrated, drink plenty of fluids.   If pt still has headaches after 24 hr of laying flat, he can call Dr. [name] at ph [phone] as pt may require a blood patch.    Called Lupita Leash back w/ above instructions and phone number.  She verbalized understanding.

## 2012-07-06 ENCOUNTER — Other Ambulatory Visit (HOSPITAL_COMMUNITY): Payer: Self-pay | Admitting: Interventional Radiology

## 2012-07-06 ENCOUNTER — Ambulatory Visit (HOSPITAL_COMMUNITY)
Admission: RE | Admit: 2012-07-06 | Discharge: 2012-07-06 | Disposition: A | Discharge status: Home / Self Care | Payer: Medicaid Other | Source: Ambulatory Visit | Attending: Interventional Radiology | Admitting: Interventional Radiology

## 2012-07-06 DIAGNOSIS — C8590 Non-Hodgkin lymphoma, unspecified, unspecified site: Secondary | ICD-10-CM

## 2012-07-06 DIAGNOSIS — Y844 Aspiration of fluid as the cause of abnormal reaction of the patient, or of later complication, without mention of misadventure at the time of the procedure: Secondary | ICD-10-CM | POA: Insufficient documentation

## 2012-07-06 DIAGNOSIS — G971 Other reaction to spinal and lumbar puncture: Secondary | ICD-10-CM | POA: Insufficient documentation

## 2012-07-06 MED ORDER — IOHEXOL 300 MG/ML  SOLN: 10.0000 mL | Freq: Once | INTRAMUSCULAR | Status: AC | PRN

## 2012-07-14 ENCOUNTER — Telehealth: Payer: Self-pay | Admitting: *Deleted

## 2012-07-14 NOTE — Telephone Encounter (Signed)
Pt requesting refill on his Percocet 7.5 mg/325 mg last Rx by Belenda Cruise #[id number], 1 po q 6 hrs PRN 14 days ago.

## 2012-07-14 NOTE — Telephone Encounter (Signed)
OK to refill, but this will be the last refill since he should not have severe pain any more.  He may want to take Tylenol instead.  I have high suspicion for abuse.

## 2012-07-15 ENCOUNTER — Encounter (HOSPITAL_COMMUNITY): Payer: Self-pay | Admitting: Pharmacy Technician

## 2012-07-15 ENCOUNTER — Other Ambulatory Visit: Payer: Self-pay

## 2012-07-15 DIAGNOSIS — C8589 Other specified types of non-Hodgkin lymphoma, extranodal and solid organ sites: Secondary | ICD-10-CM

## 2012-07-15 MED ORDER — OXYCODONE-ACETAMINOPHEN 7.5-325 MG PO TABS: 1.0000 | ORAL_TABLET | Freq: Four times a day (QID) | ORAL | Status: DC | PRN

## 2012-07-15 NOTE — Progress Notes (Signed)
Spoke to patient concerning Percocet prescription and informed him that this was the final time that Dr. Gaylyn Rong would authorize refilling prescription; patient states that he was told different and would speak to Dr. Gaylyn Rong at his next appointment on 07/18/12; explained to patient at length that he had the option of taking Tylenol or Motrin for pain control and should not need a narcotic pain med at this time; Rx filled today and patient to pick up from injection nurse.

## 2012-07-17 ENCOUNTER — Telehealth: Payer: Self-pay | Admitting: Oncology

## 2012-07-17 NOTE — Telephone Encounter (Signed)
Encounter opened in error

## 2012-07-18 ENCOUNTER — Telehealth: Payer: Self-pay | Admitting: Oncology

## 2012-07-18 ENCOUNTER — Encounter: Payer: Self-pay | Admitting: Nutrition

## 2012-07-18 ENCOUNTER — Telehealth: Payer: Self-pay | Admitting: *Deleted

## 2012-07-18 ENCOUNTER — Ambulatory Visit: Payer: Self-pay

## 2012-07-18 ENCOUNTER — Ambulatory Visit: Payer: Self-pay | Admitting: Oncology

## 2012-07-18 ENCOUNTER — Other Ambulatory Visit: Payer: Self-pay

## 2012-07-18 NOTE — Telephone Encounter (Signed)
Per staff phone call and POF I have schedueld appts.  JMW  

## 2012-07-18 NOTE — Telephone Encounter (Signed)
Nicholas King was due in our office for labs/visit/chemo today. He was >1 hour late for appts. Call placed to sister, Nicholas King. Nicholas King was not sure where patient was, but thinks that he might be in [facility] visiting friends. I notified sister that I would have schedulers contact patient to r/s all visits. I will also r/s Neulasta for 6/14 and IT chemo in Radiology for 6/16. POF has been sent to scheduling.

## 2012-07-19 ENCOUNTER — Ambulatory Visit: Payer: Self-pay

## 2012-07-21 ENCOUNTER — Inpatient Hospital Stay (HOSPITAL_COMMUNITY): Admission: RE | Admit: 2012-07-21 | Payer: Self-pay | Source: Ambulatory Visit

## 2012-07-21 ENCOUNTER — Ambulatory Visit (HOSPITAL_COMMUNITY): Admission: RE | Admit: 2012-07-21 | Payer: MEDICAID | Source: Ambulatory Visit

## 2012-07-23 ENCOUNTER — Ambulatory Visit: Payer: Self-pay | Admitting: Oncology

## 2012-07-23 ENCOUNTER — Telehealth: Payer: Self-pay | Admitting: Oncology

## 2012-07-23 ENCOUNTER — Other Ambulatory Visit: Payer: Self-pay | Admitting: Lab

## 2012-07-23 NOTE — Telephone Encounter (Signed)
Called pt appt for lab , Md and chemo on 08/07/12 , leftr message

## 2012-07-24 ENCOUNTER — Ambulatory Visit (HOSPITAL_BASED_OUTPATIENT_CLINIC_OR_DEPARTMENT_OTHER): Payer: Medicaid Other | Admitting: Oncology

## 2012-07-24 ENCOUNTER — Encounter: Payer: Self-pay | Admitting: Oncology

## 2012-07-24 ENCOUNTER — Other Ambulatory Visit (HOSPITAL_BASED_OUTPATIENT_CLINIC_OR_DEPARTMENT_OTHER): Payer: Medicaid Other | Admitting: Lab

## 2012-07-24 ENCOUNTER — Telehealth: Payer: Self-pay | Admitting: Oncology

## 2012-07-24 ENCOUNTER — Ambulatory Visit (HOSPITAL_BASED_OUTPATIENT_CLINIC_OR_DEPARTMENT_OTHER): Payer: Medicaid Other

## 2012-07-24 VITALS — BP 114/76 | HR 70 | Temp 98.2°F | Resp 16

## 2012-07-24 VITALS — BP 126/77 | HR 81 | Temp 98.1°F | Resp 18 | Ht 67.0 in | Wt 102.6 lb

## 2012-07-24 DIAGNOSIS — Z5112 Encounter for antineoplastic immunotherapy: Secondary | ICD-10-CM

## 2012-07-24 DIAGNOSIS — K1370 Unspecified lesions of oral mucosa: Secondary | ICD-10-CM

## 2012-07-24 DIAGNOSIS — C8589 Other specified types of non-Hodgkin lymphoma, extranodal and solid organ sites: Secondary | ICD-10-CM

## 2012-07-24 DIAGNOSIS — Z5111 Encounter for antineoplastic chemotherapy: Secondary | ICD-10-CM

## 2012-07-24 DIAGNOSIS — C8330 Diffuse large B-cell lymphoma, unspecified site: Secondary | ICD-10-CM

## 2012-07-24 LAB — CBC WITH DIFFERENTIAL/PLATELET
Basophils Absolute: 0.1 10*3/uL (ref 0.0–0.1)
EOS%: 0.3 % (ref 0.0–7.0)
Eosinophils Absolute: 0 10*3/uL (ref 0.0–0.5)
HGB: 10.9 g/dL — ABNORMAL LOW (ref 13.0–17.1)
MCH: 26.8 pg — ABNORMAL LOW (ref 27.2–33.4)
MONO#: 1.4 10*3/uL — ABNORMAL HIGH (ref 0.1–0.9)
NEUT#: 5.3 10*3/uL (ref 1.5–6.5)
RDW: 20.2 % — ABNORMAL HIGH (ref 11.0–14.6)
WBC: 8 10*3/uL (ref 4.0–10.3)
lymph#: 1.3 10*3/uL (ref 0.9–3.3)

## 2012-07-24 LAB — COMPREHENSIVE METABOLIC PANEL (CC13)
Albumin: 2.9 g/dL — ABNORMAL LOW (ref 3.5–5.0)
Alkaline Phosphatase: 59 U/L (ref 40–150)
Glucose: 83 mg/dl (ref 70–99)
Potassium: 4.1 mEq/L (ref 3.5–5.1)
Sodium: 139 mEq/L (ref 136–145)
Total Protein: 6.2 g/dL — ABNORMAL LOW (ref 6.4–8.3)

## 2012-07-24 MED ORDER — SODIUM CHLORIDE 0.9 % IV SOLN
750.0000 mg/m2 | Freq: Once | INTRAVENOUS | Status: AC
  Administered 2012-07-24: 1080 mg via INTRAVENOUS
  Filled 2012-07-24: qty 54

## 2012-07-24 MED ORDER — SODIUM CHLORIDE 0.9 % IV SOLN
375.0000 mg/m2 | Freq: Once | INTRAVENOUS | Status: AC
  Administered 2012-07-24: 500 mg via INTRAVENOUS
  Filled 2012-07-24: qty 50

## 2012-07-24 MED ORDER — DIPHENHYDRAMINE HCL 25 MG PO CAPS
50.0000 mg | ORAL_CAPSULE | Freq: Once | ORAL | Status: AC
  Administered 2012-07-24: 50 mg via ORAL

## 2012-07-24 MED ORDER — SODIUM CHLORIDE 0.9 % IJ SOLN
10.0000 mL | INTRAMUSCULAR | Status: DC | PRN
  Administered 2012-07-24: 10 mL
  Filled 2012-07-24: qty 10

## 2012-07-24 MED ORDER — SODIUM CHLORIDE 0.9 % IV SOLN
Freq: Once | INTRAVENOUS | Status: AC
  Administered 2012-07-24: 10:00:00 via INTRAVENOUS

## 2012-07-24 MED ORDER — ACETAMINOPHEN 325 MG PO TABS
650.0000 mg | ORAL_TABLET | Freq: Once | ORAL | Status: AC
  Administered 2012-07-24: 650 mg via ORAL

## 2012-07-24 MED ORDER — DEXAMETHASONE SODIUM PHOSPHATE 20 MG/5ML IJ SOLN
20.0000 mg | Freq: Once | INTRAMUSCULAR | Status: AC
  Administered 2012-07-24: 20 mg via INTRAVENOUS

## 2012-07-24 MED ORDER — VINCRISTINE SULFATE CHEMO INJECTION 1 MG/ML
2.0000 mg | Freq: Once | INTRAVENOUS | Status: AC
  Administered 2012-07-24: 2 mg via INTRAVENOUS
  Filled 2012-07-24: qty 2

## 2012-07-24 MED ORDER — HEPARIN SOD (PORK) LOCK FLUSH 100 UNIT/ML IV SOLN
500.0000 [IU] | Freq: Once | INTRAVENOUS | Status: AC | PRN
  Administered 2012-07-24: 500 [IU]
  Filled 2012-07-24: qty 5

## 2012-07-24 MED ORDER — ONDANSETRON 16 MG/50ML IVPB (CHCC)
16.0000 mg | Freq: Once | INTRAVENOUS | Status: AC
  Administered 2012-07-24: 16 mg via INTRAVENOUS

## 2012-07-24 MED ORDER — DOXORUBICIN HCL CHEMO IV INJECTION 2 MG/ML
50.0000 mg/m2 | Freq: Once | INTRAVENOUS | Status: AC
  Administered 2012-07-24: 72 mg via INTRAVENOUS
  Filled 2012-07-24: qty 36

## 2012-07-24 NOTE — Addendum Note (Signed)
Addended by: Myrtis Ser on: 07/24/2012 04:04 PM   Modules accepted: Orders

## 2012-07-24 NOTE — Patient Instructions (Addendum)
[facility] Discharge Instructions for Patients Receiving Chemotherapy  Today you received the following chemotherapy agents: Rituxin, doxorubicin, cytoxan, vincristine  To help prevent nausea and vomiting after your treatment, we encourage you to take your nausea medication as often as prescribed..   If you develop nausea and vomiting that is not controlled by your nausea medication, call the clinic.  BELOW ARE SYMPTOMS THAT SHOULD BE REPORTED IMMEDIATELY:  *FEVER GREATER THAN 100.5 F  *CHILLS WITH OR WITHOUT FEVER  NAUSEA AND VOMITING THAT IS NOT CONTROLLED WITH YOUR NAUSEA MEDICATION  *UNUSUAL SHORTNESS OF BREATH  *UNUSUAL BRUISING OR BLEEDING  TENDERNESS IN MOUTH AND THROAT WITH OR WITHOUT PRESENCE OF ULCERS  *URINARY PROBLEMS  *BOWEL PROBLEMS  UNUSUAL RASH Items with * indicate a potential emergency and should be followed up as soon as possible.   The clinic phone number is [phone].    

## 2012-07-24 NOTE — Telephone Encounter (Signed)
gv adn printed appt sched and avs for pt....MW added tx...radiology chemo scheduled...emailed [facility] to add ordr for 7.14.14 chemo will call pt with d/t

## 2012-07-24 NOTE — Progress Notes (Signed)
Excellent blood return before and after Vincristine infusion.

## 2012-07-24 NOTE — Progress Notes (Signed)
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   PAST THERAPY:  Biopsy only   CURRENT THERAPY: started chemo R-CHOP on 06/09/2012; q3 weeks; for goal of chemo at least 4 cycles depending on follow up interval PET scan; Neulasta on day # 2 of each chemo cycle; possible consolidative radiation; and intrathecal prophylactic chemo with cycle #2 onward.   INTERVAL HISTORY: Nicholas King 42 y.o. male returns for regular follow up with his girlfriend. He missed his appointment last week for chemo. He reports significant improvement in his mouth sore from the lymphoma.  He is able to eat now.  He uses Percocet ~ 3 times per day.  He no longer feels any palpable neck node.  He denied fever, mucositis, nausea/vomiting, SOB, Chest pain, abd pain, neuropathy, constipation, skin rash, bleeding symptoms.  The rest of the 14-point review of system was negative.    Past Medical History  Diagnosis Date  . GERD (gastroesophageal reflux disease)   . Cancer   . Lymphoma   . Dysphagia, unspecified(787.20) 05/16/2012  . Pneumonia     April 2014  . Post lumbar puncture headache 07/03/2012    Past Surgical History  Procedure Laterality Date  . No past surgeries    . Direct laryngoscopy Left 05/08/2012    Procedure: DIRECT LARYNGOSCOPY with biopsy left tonsil;  Surgeon: Suzanna Obey, MD;  Location: [facility] OR;  Service: ENT;  Laterality: Left;  . Right eyebrown bone      Current Outpatient Prescriptions  Medication Sig Dispense Refill  . lidocaine-prilocaine (EMLA) cream Apply 1 application topically daily as needed (for port access).      . ondansetron (ZOFRAN) 8 MG tablet Take 8 mg by mouth every 8 (eight) hours as needed for nausea.      Marland Kitchen oxyCODONE-acetaminophen (PERCOCET) 7.5-325 MG per tablet Take 1 tablet by mouth every 6 (six) hours as needed for pain.   60 tablet  0  . predniSONE (DELTASONE) 20 MG tablet Take 60 mg by mouth daily. Take on days 1-5 of chemo      . PRESCRIPTION MEDICATION Inject 1 Syringe into the vein. riTUXimab (RITUXAN) 500 mg in sodium chloride 0.9 % 200 mL chemo infusion 375 mg/m2  1.45 m2 06/27/2012      . PRESCRIPTION MEDICATION Inject 1 Syringe into the vein. DOXOrubicin (ADRIAMYCIN) chemo injection 72 mg 50 mg/m2  1.44 m2 (Treatment Plan Actual) 06/27/2012      . PRESCRIPTION MEDICATION Inject 1 Syringe into the vein. cyclophosphamide (CYTOXAN) 1,080 mg in sodium chloride 0.9 % 250 mL chemo infusion 750 mg/m2  1.44 m2  06/27/2012       No current facility-administered medications for this visit.   Facility-Administered Medications Ordered in Other Visits  Medication Dose Route Frequency Provider Last Rate Last Dose  . heparin lock flush 100 unit/mL  500 Units Intracatheter Once PRN Exie Parody, MD      . sodium chloride 0.9 % injection 10 mL  10 mL Intracatheter PRN Exie Parody, MD        ALLERGIES:  is allergic to penicillins; amoxicillin; and hydrocodone.  REVIEW OF SYSTEMS:  The rest of the 14-point review of system was negative.   Filed Vitals:   07/24/12 0833  BP: 126/77  Pulse: 81  Temp: 98.1 F (36.7 C)  Resp: 18   Wt  Readings from Last 3 Encounters:  07/24/12 102 lb 9.6 oz (46.539 kg)  07/03/12 99 lb 8 oz (45.133 kg)  07/01/12 98 lb 6 oz (44.623 kg)   ECOG Performance status: 0-1  PHYSICAL EXAMINATION:   General:  Thin-appearing man, in no acute distress.  ENT: There was no tonsilar mass. There was no longer foul odor. Neck was without thyromegaly. Lymphatics: Negative for cervical,  supraclavicular or axillary adenopathy.  Respiratory: lungs were clear bilaterally without wheezing or crackles.  Cardiovascular:  Regular rate and rhythm, S1/S2, without murmur, rub or gallop.  There was no pedal edema.  GI:  abdomen was soft, flat, nontender, nondistended, without organomegaly.  Muscoloskeletal:  no  spinal tenderness of palpation of vertebral spine.  Skin exam was without echymosis, petichae.  Neuro exam was nonfocal.  Patient was able to get on and off exam table without assistance.  Gait was normal.  Patient was alert and oriented.  He was a little drowsy but claimed that he did not sleep much last night. He was able to answer questions appropriately.   Language was appropriate.  Mood was normal without depression.  Speech was not pressured.  Thought content was not tangential.      LABORATORY/RADIOLOGY DATA:  Lab Results  Component Value Date   WBC 8.0 07/24/2012   HGB 10.9* 07/24/2012   HCT 35.0* 07/24/2012   PLT 362 07/24/2012   GLUCOSE 83 07/24/2012   ALKPHOS 59 07/24/2012   ALT 6 07/24/2012   AST 10 07/24/2012   NA 139 07/24/2012   K 4.1 07/24/2012   CL 107 07/24/2012   CREATININE 0.6* 07/24/2012   BUN 6.4* 07/24/2012   CO2 23 07/24/2012   INR 1.06 06/02/2012    ASSESSMENT AND PLAN:   1.  Stage II DLBCL:   - Doing well after 2 cycles of R-CHOP with clinical response.   - He has no dose limiting toxicity. - I recommended to proceed with the 3rd out of 4 cycles without dose modification.  I also recommended LP for intrathecal chemo next week to decrease risk of intracranial lymphoma in the future.  He expressed informed understanding and wished to proceed.  - I advised him to take Prednisone daily from days 1-5 of each cycle of chemo.  I advised him to return to clinic the day after chemo to receive Neulasta to decrease risk of infection.   2.  Mouth sore:  From lymphoma.  Much improved.  He has Percocet to use as needed. I have informed him that since he has had a clinical response, narcotic pain medications are no longer indicated. Recommend that he use Tylenol or Ibuprofen as needed for pain. He states that these do not work. I have offered a prescription for Tramadol, but the patient declined. He states that he will just obtain narcotic pain medications off the street.  3.  Follow  up:  At the [facility] in about 3 weeks for cycle #4 of chemo.     The length of time of the face-to-face encounter was 15 minutes. More than 50% of time was spent counseling and coordination of care.

## 2012-07-24 NOTE — Telephone Encounter (Signed)
s.w pt and advised on 7.14.14 appt....pt ok and aware °

## 2012-07-25 ENCOUNTER — Ambulatory Visit (HOSPITAL_BASED_OUTPATIENT_CLINIC_OR_DEPARTMENT_OTHER): Payer: Medicaid Other

## 2012-07-25 VITALS — BP 121/75 | HR 59 | Temp 98.0°F | Resp 18

## 2012-07-25 DIAGNOSIS — C8589 Other specified types of non-Hodgkin lymphoma, extranodal and solid organ sites: Secondary | ICD-10-CM

## 2012-07-25 DIAGNOSIS — Z5189 Encounter for other specified aftercare: Secondary | ICD-10-CM

## 2012-07-25 MED ORDER — PEGFILGRASTIM INJECTION 6 MG/0.6ML
6.0000 mg | Freq: Once | SUBCUTANEOUS | Status: AC
  Administered 2012-07-25: 6 mg via SUBCUTANEOUS
  Filled 2012-07-25: qty 0.6

## 2012-07-25 NOTE — Patient Instructions (Signed)
Call MD for problems or concerns 

## 2012-07-28 ENCOUNTER — Encounter (HOSPITAL_COMMUNITY): Payer: Self-pay

## 2012-07-28 ENCOUNTER — Ambulatory Visit (HOSPITAL_COMMUNITY)
Admission: RE | Admit: 2012-07-28 | Discharge: 2012-07-28 | Disposition: A | Discharge status: Home / Self Care | Payer: Medicaid Other | Source: Ambulatory Visit | Attending: Oncology | Admitting: Oncology

## 2012-07-28 DIAGNOSIS — C8330 Diffuse large B-cell lymphoma, unspecified site: Secondary | ICD-10-CM

## 2012-07-28 DIAGNOSIS — C8589 Other specified types of non-Hodgkin lymphoma, extranodal and solid organ sites: Secondary | ICD-10-CM | POA: Insufficient documentation

## 2012-07-28 DIAGNOSIS — Z5111 Encounter for antineoplastic chemotherapy: Secondary | ICD-10-CM | POA: Insufficient documentation

## 2012-07-28 MED ORDER — SODIUM CHLORIDE 0.9 % IJ SOLN
Freq: Once | INTRAMUSCULAR | Status: DC
  Filled 2012-07-28: qty 0.48

## 2012-07-28 NOTE — Procedures (Signed)
Procedure: Fluoro guided Lumbar Puncture at L2-3 and injection of Chemo. Specimen: None Bleeding: Minimal. Complications: None immediate. Patient   -Condition: Stable.  -Disposition:  To short stay, and then d/c to home.  Full Radiology Report to Follow under Imaging

## 2012-07-30 ENCOUNTER — Other Ambulatory Visit: Payer: Self-pay | Admitting: Certified Registered Nurse Anesthetist

## 2012-08-07 ENCOUNTER — Encounter: Payer: Self-pay | Admitting: Nutrition

## 2012-08-07 ENCOUNTER — Other Ambulatory Visit: Payer: Self-pay

## 2012-08-07 ENCOUNTER — Ambulatory Visit: Payer: Self-pay | Admitting: Oncology

## 2012-08-07 ENCOUNTER — Ambulatory Visit: Payer: Self-pay

## 2012-08-09 ENCOUNTER — Ambulatory Visit: Payer: Self-pay

## 2012-08-11 ENCOUNTER — Inpatient Hospital Stay (HOSPITAL_COMMUNITY): Admission: RE | Admit: 2012-08-11 | Payer: Self-pay | Source: Ambulatory Visit

## 2012-08-11 ENCOUNTER — Other Ambulatory Visit (HOSPITAL_COMMUNITY): Payer: Self-pay

## 2012-08-14 ENCOUNTER — Ambulatory Visit (HOSPITAL_BASED_OUTPATIENT_CLINIC_OR_DEPARTMENT_OTHER): Payer: Medicaid Other | Admitting: Oncology

## 2012-08-14 ENCOUNTER — Ambulatory Visit (HOSPITAL_BASED_OUTPATIENT_CLINIC_OR_DEPARTMENT_OTHER): Payer: Medicaid Other

## 2012-08-14 ENCOUNTER — Other Ambulatory Visit (HOSPITAL_BASED_OUTPATIENT_CLINIC_OR_DEPARTMENT_OTHER): Payer: Medicaid Other | Admitting: Lab

## 2012-08-14 ENCOUNTER — Telehealth: Payer: Self-pay | Admitting: Oncology

## 2012-08-14 ENCOUNTER — Ambulatory Visit: Payer: Medicaid Other | Admitting: Nutrition

## 2012-08-14 VITALS — BP 104/70 | HR 87 | Temp 98.3°F | Resp 18 | Ht 67.0 in | Wt 104.6 lb

## 2012-08-14 VITALS — BP 102/66 | HR 75 | Temp 97.6°F | Resp 12

## 2012-08-14 DIAGNOSIS — Z5112 Encounter for antineoplastic immunotherapy: Secondary | ICD-10-CM

## 2012-08-14 DIAGNOSIS — Z5111 Encounter for antineoplastic chemotherapy: Secondary | ICD-10-CM

## 2012-08-14 DIAGNOSIS — C8589 Other specified types of non-Hodgkin lymphoma, extranodal and solid organ sites: Secondary | ICD-10-CM

## 2012-08-14 DIAGNOSIS — C8330 Diffuse large B-cell lymphoma, unspecified site: Secondary | ICD-10-CM

## 2012-08-14 DIAGNOSIS — H6091 Unspecified otitis externa, right ear: Secondary | ICD-10-CM

## 2012-08-14 DIAGNOSIS — H60399 Other infective otitis externa, unspecified ear: Secondary | ICD-10-CM

## 2012-08-14 LAB — COMPREHENSIVE METABOLIC PANEL (CC13)
ALT: 15 U/L (ref 0–55)
AST: 16 U/L (ref 5–34)
CO2: 28 mEq/L (ref 22–29)
Calcium: 9.3 mg/dL (ref 8.4–10.4)
Chloride: 102 mEq/L (ref 98–109)
Creatinine: 0.7 mg/dL (ref 0.7–1.3)
Sodium: 139 mEq/L (ref 136–145)
Total Protein: 6.8 g/dL (ref 6.4–8.3)

## 2012-08-14 LAB — CBC WITH DIFFERENTIAL/PLATELET
BASO%: 2.3 % — ABNORMAL HIGH (ref 0.0–2.0)
Eosinophils Absolute: 0 10*3/uL (ref 0.0–0.5)
LYMPH%: 23.4 % (ref 14.0–49.0)
MCHC: 32.2 g/dL (ref 32.0–36.0)
MONO#: 0.9 10*3/uL (ref 0.1–0.9)
NEUT#: 3 10*3/uL (ref 1.5–6.5)
RBC: 4.21 10*6/uL (ref 4.20–5.82)
RDW: 19.1 % — ABNORMAL HIGH (ref 11.0–14.6)
WBC: 5.3 10*3/uL (ref 4.0–10.3)
lymph#: 1.2 10*3/uL (ref 0.9–3.3)
nRBC: 0 % (ref 0–0)

## 2012-08-14 MED ORDER — SODIUM CHLORIDE 0.9 % IV SOLN
500.0000 mL | Freq: Once | INTRAVENOUS | Status: AC
  Administered 2012-08-14: 500 mL via INTRAVENOUS

## 2012-08-14 MED ORDER — CIPROFLOXACIN-HYDROCORTISONE 0.2-1 % OT SUSP: 3.0000 [drp] | Freq: Two times a day (BID) | OTIC | Status: DC

## 2012-08-14 MED ORDER — SODIUM CHLORIDE 0.9 % IV SOLN
750.0000 mg/m2 | Freq: Once | INTRAVENOUS | Status: AC
  Administered 2012-08-14: 1080 mg via INTRAVENOUS
  Filled 2012-08-14: qty 54

## 2012-08-14 MED ORDER — SODIUM CHLORIDE 0.9 % IV SOLN
375.0000 mg/m2 | Freq: Once | INTRAVENOUS | Status: AC
  Administered 2012-08-14: 500 mg via INTRAVENOUS
  Filled 2012-08-14: qty 50

## 2012-08-14 MED ORDER — SODIUM CHLORIDE 0.9 % IJ SOLN
10.0000 mL | INTRAMUSCULAR | Status: DC | PRN
  Administered 2012-08-14: 10 mL
  Filled 2012-08-14: qty 10

## 2012-08-14 MED ORDER — ONDANSETRON 16 MG/50ML IVPB (CHCC)
16.0000 mg | Freq: Once | INTRAVENOUS | Status: AC
  Administered 2012-08-14: 16 mg via INTRAVENOUS

## 2012-08-14 MED ORDER — DEXAMETHASONE SODIUM PHOSPHATE 20 MG/5ML IJ SOLN
20.0000 mg | Freq: Once | INTRAMUSCULAR | Status: AC
  Administered 2012-08-14: 20 mg via INTRAVENOUS

## 2012-08-14 MED ORDER — DIPHENHYDRAMINE HCL 25 MG PO CAPS
50.0000 mg | ORAL_CAPSULE | Freq: Once | ORAL | Status: AC
  Administered 2012-08-14: 50 mg via ORAL

## 2012-08-14 MED ORDER — SODIUM CHLORIDE 0.9 % IV SOLN
2.0000 mg | Freq: Once | INTRAVENOUS | Status: AC
  Administered 2012-08-14: 2 mg via INTRAVENOUS
  Filled 2012-08-14: qty 2

## 2012-08-14 MED ORDER — SODIUM CHLORIDE 0.9 % IV SOLN
Freq: Once | INTRAVENOUS | Status: AC
  Administered 2012-08-14: 10:00:00 via INTRAVENOUS

## 2012-08-14 MED ORDER — ACETAMINOPHEN 325 MG PO TABS
650.0000 mg | ORAL_TABLET | Freq: Once | ORAL | Status: AC
  Administered 2012-08-14: 650 mg via ORAL

## 2012-08-14 MED ORDER — HEPARIN SOD (PORK) LOCK FLUSH 100 UNIT/ML IV SOLN
500.0000 [IU] | Freq: Once | INTRAVENOUS | Status: AC | PRN
  Administered 2012-08-14: 500 [IU]
  Filled 2012-08-14: qty 5

## 2012-08-14 MED ORDER — DOXORUBICIN HCL CHEMO IV INJECTION 2 MG/ML
50.0000 mg/m2 | Freq: Once | INTRAVENOUS | Status: AC
  Administered 2012-08-14: 72 mg via INTRAVENOUS
  Filled 2012-08-14: qty 36

## 2012-08-14 NOTE — Progress Notes (Signed)
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   PAST THERAPY:  Biopsy only   CURRENT THERAPY: started chemo R-CHOP on 06/09/2012; q3 weeks; for goal of chemo at least 4 cycles depending on follow up interval PET scan; Neulasta on day # 2 of each chemo cycle; possible consolidative radiation; and intrathecal prophylactic chemo with cycle #2 onward.   INTERVAL HISTORY: Nicholas King 42 y.o. male returns for regular follow up with his girlfriend. He is tolerating chemo well.  He has some numbness in the flexion surface of bilateral proximal thighs without neuropathy in the hands or feet.  He denied mucositis, dysphagia, odynophagia, palpable neck nodes, fever, nausea/vomiting, visible bleeding symptom.  He tolerated the 2nd lumbar puncture without headache. His strength now is much better than before chemo.  He is independent of all activities of daily living. He has improved appetite and weight.  He is no longer on any pain medication. He complained of right ear pain and discharge the last few days without any hearing loss or bleeding. He denied trauma to the right ear.    Past Medical History  Diagnosis Date  . GERD (gastroesophageal reflux disease)   . Cancer   . Lymphoma   . Dysphagia, unspecified(787.20) 05/16/2012  . Pneumonia     April 2014  . Post lumbar puncture headache 07/03/2012    Past Surgical History  Procedure Laterality Date  . No past surgeries    . Direct laryngoscopy Left 05/08/2012    Procedure: DIRECT LARYNGOSCOPY with biopsy left tonsil;  Surgeon: Suzanna Obey, MD;  Location: [facility] OR;  Service: ENT;  Laterality: Left;  . Right eyebrown bone      Current Outpatient Prescriptions  Medication Sig Dispense Refill  . lidocaine-prilocaine (EMLA) cream Apply 1 application topically daily as needed (for port access).        . ondansetron (ZOFRAN) 8 MG tablet Take 8 mg by mouth every 8 (eight) hours as needed for nausea.      . predniSONE (DELTASONE) 20 MG tablet Take 60 mg by mouth daily. Take on days 1-5 of chemo      . PRESCRIPTION MEDICATION Inject 1 Syringe into the vein. riTUXimab (RITUXAN) 500 mg in sodium chloride 0.9 % 200 mL chemo infusion 375 mg/m2  1.45 m2 06/27/2012      . PRESCRIPTION MEDICATION Inject 1 Syringe into the vein. DOXOrubicin (ADRIAMYCIN) chemo injection 72 mg 50 mg/m2  1.44 m2 (Treatment Plan Actual) 06/27/2012      . PRESCRIPTION MEDICATION Inject 1 Syringe into the vein. cyclophosphamide (CYTOXAN) 1,080 mg in sodium chloride 0.9 % 250 mL chemo infusion 750 mg/m2  1.44 m2  06/27/2012      . ciprofloxacin-hydrocortisone (CIPRO HC) otic suspension Place 3 drops into the right ear 2 (two) times daily.  10 mL  0   No current facility-administered medications for this visit.    ALLERGIES:  is allergic to penicillins; amoxicillin; and hydrocodone.  REVIEW OF SYSTEMS:  The rest of the 14-point review of system was negative.   Filed Vitals:   08/14/12 0918  BP: 104/70  Pulse: 87  Temp: 98.3 F (36.8 C)  Resp: 18   Wt Readings from Last 3 Encounters:  08/14/12 104 lb 9.6 oz (47.446 kg)  07/24/12 102 lb 9.6 oz (46.539 kg)  07/03/12 99 lb 8 oz (45.133  kg)   ECOG Performance status: 0  PHYSICAL EXAMINATION:   General:  Thin-appearing man, in no acute distress.  ENT: There was no tonsilar mass. Right ear canal had slight purulent discharge. There was no longer foul odor from his oropharynx. Neck was without thyromegaly. Lymphatics: Negative for cervical,  supraclavicular or axillary adenopathy.  Respiratory: lungs were clear bilaterally without wheezing or crackles.  Cardiovascular:  Regular rate and rhythm, S1/S2, without murmur, rub or gallop.  There was no pedal edema.  GI:  abdomen was soft, flat, nontender, nondistended, without organomegaly.  Muscoloskeletal:  no spinal tenderness  of palpation of vertebral spine.  Skin exam was without echymosis, petichae.  Neuro exam was nonfocal.  Patient was able to get on and off exam table without assistance.  Gait was normal.  Patient was alert and oriented.  He was no drowsy at all.  Language was appropriate.  Mood was normal without depression.  Speech was not pressured.  Thought content was not tangential.        LABORATORY/RADIOLOGY DATA:  Lab Results  Component Value Date   WBC 5.3 08/14/2012   HGB 11.7* 08/14/2012   HCT 36.3* 08/14/2012   PLT 402* 08/14/2012   GLUCOSE 153* 08/14/2012   ALKPHOS 73 08/14/2012   ALT 15 08/14/2012   AST 16 08/14/2012   NA 139 08/14/2012   K 4.2 08/14/2012   CL 107 07/24/2012   CREATININE 0.7 08/14/2012   BUN 6.8* 08/14/2012   CO2 28 08/14/2012   INR 1.06 06/02/2012    ASSESSMENT AND PLAN:   1.  Stage II DLBCL:   - Doing well after 3 cycles of R-CHOP with clinical response.   - He has no dose limiting toxicity. - I recommended to proceed with the 4th cycle of chemo without dose modification. He will also have the 3rd dose of intrathecal chemo next week.  -  I went ahead and requested restaging PET scan after this cycle.  If has he complete response, consideration will be referral to Rad Onc for evaluation of the role of consolidative radiation.   2.  Mouth sore:  From lymphoma. It is now resolved.  There is no longer any indication for pain med.   3.  Right otitis externa:  I prescribed Ciprodex ear drop.    4.  Follow up:  At the [facility] in about 3 weeks to go over result of PET scan.    I informed Nicholas King and his relative that I am leaving the practice.  [facility] will arrange for him to see another provider when he returns.   The length of time of the face-to-face encounter was 25 minutes. More than 50% of time was spent counseling and coordination of care.

## 2012-08-14 NOTE — Progress Notes (Signed)
1250 - 30 minutes VS check - BP 76/47.  Notified MD - ordered 500 mL bolus NS over 30 minutes, then restart rituxan at 100 mL/hr

## 2012-08-14 NOTE — Patient Instructions (Addendum)
[facility] Discharge Instructions for Patients Receiving Chemotherapy  Today you received the following chemotherapy agents: adriamycin, rituxan, vincristine, cytoxan  To help prevent nausea and vomiting after your treatment, we encourage you to take your nausea medication.  Take it as often as prescribed.     If you develop nausea and vomiting that is not controlled by your nausea medication, call the clinic. If it is after clinic hours your family physician or the after hours number for the clinic or go to the Emergency Department.   BELOW ARE SYMPTOMS THAT SHOULD BE REPORTED IMMEDIATELY:  *FEVER GREATER THAN 100.5 F  *CHILLS WITH OR WITHOUT FEVER  NAUSEA AND VOMITING THAT IS NOT CONTROLLED WITH YOUR NAUSEA MEDICATION  *UNUSUAL SHORTNESS OF BREATH  *UNUSUAL BRUISING OR BLEEDING  TENDERNESS IN MOUTH AND THROAT WITH OR WITHOUT PRESENCE OF ULCERS  *URINARY PROBLEMS  *BOWEL PROBLEMS  UNUSUAL RASH Items with * indicate a potential emergency and should be followed up as soon as possible.  Feel free to call the clinic you have any questions or concerns. The clinic phone number is [phone].   I have been informed and understand all the instructions given to me. I know to contact the clinic, my physician, or go to the Emergency Department if any problems should occur. I do not have any questions at this time, but understand that I may call the clinic during office hours   should I have any questions or need assistance in obtaining follow up care.    __________________________________________  _____________  __________ Signature of Patient or Authorized Representative            Date                   Time    __________________________________________ Nurse's Signature

## 2012-08-14 NOTE — Progress Notes (Signed)
Patient presents for a nutrition follow up during chemotherapy. He reports that he has a good appetite. His weight is up to 104.6 pounds from 97.4 pounds on May 23. He is eating 5 times daily and tolerates ice cream, pinto beans, and mashed potatoes. He does not like the Boost shakes, but is willing to try [employer].   Nutrition diagnosis: Malnutrition, improved  Intervention: High calorie/high protein nutrition reinforced. Patient was advised to continue eating small, frequent meals throughout the day. He was given samples of Carnation Instant Breakfast and encourage to drink up to 3 times daily as needed to promote weight stabilization. Teach back method used.  Monitoring, evaluation, goals: Patient with continue to tolerate a high calorie, high protein diet to promote weight stabilization.   Next visit: To be scheduled.

## 2012-08-14 NOTE — Telephone Encounter (Signed)
gave pt appt for lab and Md on 09/04/12

## 2012-08-15 ENCOUNTER — Ambulatory Visit: Payer: Self-pay

## 2012-08-15 ENCOUNTER — Telehealth: Payer: Self-pay | Admitting: *Deleted

## 2012-08-15 ENCOUNTER — Other Ambulatory Visit: Payer: Self-pay | Admitting: Oncology

## 2012-08-15 NOTE — Telephone Encounter (Signed)
Called patient on cell phone and home phone.  Was give sister's phone number.  She states that they were up all night with his girlfriend at the hospital and he was still in the bed.  She asked if we could reschedule appt to tomorrow.  He is rescheduled to 08/16/12 at 11:30am.

## 2012-08-16 ENCOUNTER — Ambulatory Visit (HOSPITAL_BASED_OUTPATIENT_CLINIC_OR_DEPARTMENT_OTHER): Payer: Medicaid Other

## 2012-08-16 VITALS — BP 110/62 | HR 98 | Temp 97.5°F

## 2012-08-16 DIAGNOSIS — Z5189 Encounter for other specified aftercare: Secondary | ICD-10-CM

## 2012-08-16 DIAGNOSIS — C8589 Other specified types of non-Hodgkin lymphoma, extranodal and solid organ sites: Secondary | ICD-10-CM

## 2012-08-16 MED ORDER — PEGFILGRASTIM INJECTION 6 MG/0.6ML
6.0000 mg | Freq: Once | SUBCUTANEOUS | Status: AC
  Administered 2012-08-16: 6 mg via SUBCUTANEOUS

## 2012-08-16 NOTE — Patient Instructions (Addendum)

## 2012-08-18 ENCOUNTER — Encounter (HOSPITAL_COMMUNITY): Payer: Self-pay

## 2012-08-18 ENCOUNTER — Ambulatory Visit (HOSPITAL_COMMUNITY)
Admission: RE | Admit: 2012-08-18 | Discharge: 2012-08-18 | Disposition: A | Discharge status: Home / Self Care | Payer: Medicaid Other | Source: Ambulatory Visit | Attending: Oncology | Admitting: Oncology

## 2012-08-18 ENCOUNTER — Other Ambulatory Visit (HOSPITAL_COMMUNITY): Payer: Self-pay | Admitting: Radiology

## 2012-08-18 VITALS — BP 88/55 | HR 75 | Temp 98.0°F | Resp 16 | Ht 67.0 in | Wt 105.0 lb

## 2012-08-18 DIAGNOSIS — C8589 Other specified types of non-Hodgkin lymphoma, extranodal and solid organ sites: Secondary | ICD-10-CM

## 2012-08-18 DIAGNOSIS — Z5111 Encounter for antineoplastic chemotherapy: Secondary | ICD-10-CM | POA: Insufficient documentation

## 2012-08-18 DIAGNOSIS — C8330 Diffuse large B-cell lymphoma, unspecified site: Secondary | ICD-10-CM

## 2012-08-18 MED ORDER — SODIUM CHLORIDE 0.9 % IJ SOLN
Freq: Once | INTRAMUSCULAR | Status: DC
  Filled 2012-08-18: qty 0.48

## 2012-08-18 NOTE — Procedures (Signed)
Procedure: Lumbar Puncture for Intrathecal Chemotherapy injection at L3-L4 Specimen: None Bleeding: Minimal. Complications: None immediate. Patient   -Condition: Stable.  -Disposition:  To recovery, then plan to d/c home.  full radiology report to follow under Imaging

## 2012-08-19 ENCOUNTER — Other Ambulatory Visit: Payer: Self-pay | Admitting: Certified Registered Nurse Anesthetist

## 2012-08-27 ENCOUNTER — Telehealth: Payer: Self-pay

## 2012-08-27 NOTE — Telephone Encounter (Signed)
Nurse had left me a post it note that patient needed OV with extender prior to setting up EGD with [facility]. I called [phone] and lady said that patient was not there. I told her that I was returning a call and to have the patient to call me back when he gets in.

## 2012-08-27 NOTE — Telephone Encounter (Signed)
Pt called me back and he has never been seen by [place]. He has [place] Medicaid and will need a referral prior to setting up OV. Pt is aware and will call us back once he gets in touch with [facility].

## 2012-09-01 ENCOUNTER — Telehealth: Payer: Self-pay | Admitting: Hematology and Oncology

## 2012-09-01 NOTE — Telephone Encounter (Signed)
Added nutrition appt after 7/31 f/u. appt added per [facility]

## 2012-09-04 ENCOUNTER — Other Ambulatory Visit (HOSPITAL_BASED_OUTPATIENT_CLINIC_OR_DEPARTMENT_OTHER): Payer: Medicaid Other | Admitting: Lab

## 2012-09-04 ENCOUNTER — Ambulatory Visit: Payer: Medicaid Other | Admitting: *Deleted

## 2012-09-04 ENCOUNTER — Ambulatory Visit: Payer: Medicaid Other | Admitting: Nutrition

## 2012-09-04 ENCOUNTER — Encounter (HOSPITAL_COMMUNITY)
Admission: RE | Admit: 2012-09-04 | Discharge: 2012-09-04 | Disposition: A | Discharge status: Home / Self Care | Payer: Medicaid Other | Source: Ambulatory Visit | Attending: Oncology | Admitting: Oncology

## 2012-09-04 DIAGNOSIS — C8330 Diffuse large B-cell lymphoma, unspecified site: Secondary | ICD-10-CM

## 2012-09-04 DIAGNOSIS — H60399 Other infective otitis externa, unspecified ear: Secondary | ICD-10-CM

## 2012-09-04 DIAGNOSIS — H6091 Unspecified otitis externa, right ear: Secondary | ICD-10-CM

## 2012-09-04 DIAGNOSIS — C8589 Other specified types of non-Hodgkin lymphoma, extranodal and solid organ sites: Secondary | ICD-10-CM | POA: Insufficient documentation

## 2012-09-04 LAB — CBC WITH DIFFERENTIAL/PLATELET
BASO%: 0.5 % (ref 0.0–2.0)
EOS%: 0.3 % (ref 0.0–7.0)
HCT: 39.4 % (ref 38.4–49.9)
LYMPH%: 13.3 % — ABNORMAL LOW (ref 14.0–49.0)
MCH: 29.4 pg (ref 27.2–33.4)
MCHC: 32.7 g/dL (ref 32.0–36.0)
NEUT%: 74.7 % (ref 39.0–75.0)
Platelets: 299 10*3/uL (ref 140–400)
RBC: 4.38 10*6/uL (ref 4.20–5.82)
WBC: 8 10*3/uL (ref 4.0–10.3)

## 2012-09-04 LAB — COMPREHENSIVE METABOLIC PANEL (CC13)
ALT: 13 U/L (ref 0–55)
AST: 18 U/L (ref 5–34)
Alkaline Phosphatase: 60 U/L (ref 40–150)
Creatinine: 0.8 mg/dL (ref 0.7–1.3)
Sodium: 144 mEq/L (ref 136–145)
Total Bilirubin: 0.2 mg/dL (ref 0.20–1.20)
Total Protein: 7.1 g/dL (ref 6.4–8.3)

## 2012-09-04 LAB — LACTATE DEHYDROGENASE (CC13): LDH: 221 U/L (ref 125–245)

## 2012-09-04 MED ORDER — FLUDEOXYGLUCOSE F - 18 (FDG) INJECTION
18.0000 | Freq: Once | INTRAVENOUS | Status: AC | PRN
  Administered 2012-09-04: 18 via INTRAVENOUS

## 2012-09-05 ENCOUNTER — Ambulatory Visit (HOSPITAL_BASED_OUTPATIENT_CLINIC_OR_DEPARTMENT_OTHER): Payer: Medicaid Other | Admitting: Hematology and Oncology

## 2012-09-05 VITALS — BP 119/79 | HR 91 | Temp 98.3°F | Resp 18 | Ht 67.0 in | Wt 113.8 lb

## 2012-09-05 DIAGNOSIS — C8589 Other specified types of non-Hodgkin lymphoma, extranodal and solid organ sites: Secondary | ICD-10-CM

## 2012-09-05 DIAGNOSIS — C8330 Diffuse large B-cell lymphoma, unspecified site: Secondary | ICD-10-CM

## 2012-09-05 NOTE — Progress Notes (Deleted)
Office visit r/s to tomorrow due to PET scan results not back yet.

## 2012-09-07 NOTE — Progress Notes (Signed)
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   INTERVAL HISTORY:  Nicholas King 42 y.o. male returns for regular follow up visit. He is tolerating chemo well.   He still has some night sweats. His appetite is good. He has diminished hearing on right side secondary to history of infection. Patient complaints on dysphagia and very minimal odynophagia. He reported cough with white sputum. He had episodes of nausea which responded well to Zofran. Occasionally he has diarrhea. Nicholas King reported back pain and occasionally numbness and tingling in his legs. The patient denied fever, chills, change in appetite or weight. He denied headaches, double vision, blurry vision, nasal congestion, nasal discharge. No chest pain, palpitations, dyspnea, abdominal pain, vomiting, diarrhea, constipation, hematochezia. The patient denied dysuria, nocturia, polyuria, hematuria, myalgia, , psychiatric problems.  Review of Systems  Constitutional: Negative for fever, chills, weight loss and malaise/fatigue.  HENT: Positive for hearing loss. Negative for ear pain, nosebleeds, congestion, sore throat, neck pain and tinnitus.   Eyes: Negative for blurred vision, double vision, photophobia and pain.  Respiratory: Positive for cough and sputum production. Negative for hemoptysis, shortness of breath and wheezing.   Cardiovascular: Negative for chest pain, palpitations, orthopnea, leg swelling and PND.  Gastrointestinal: Positive for nausea  and diarrhea. Negative for heartburn, vomiting, abdominal pain, constipation and blood in stool.  Genitourinary: Negative for dysuria, urgency, frequency and hematuria.  Musculoskeletal: Positive for back pain. Negative for myalgias and joint pain.  Skin: Negative for itching and rash.  Neurological: Positive for tingling. Negative for dizziness, tremors, seizures, loss of consciousness, weakness and headaches.  Endo/Heme/Allergies: Negative for polydipsia. Does not bruise/bleed easily.  Psychiatric/Behavioral: Negative.     PAST MEDICAL HISTORY: Past Medical History  Diagnosis Date  . GERD (gastroesophageal reflux disease)   . Cancer   . Lymphoma   . Dysphagia, unspecified(787.20) 05/16/2012  . Pneumonia     April 2014  . 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 No past surgeries    . Direct laryngoscopy Left 05/08/2012    Procedure: DIRECT LARYNGOSCOPY with biopsy left tonsil;  Surgeon: Nicholas Obey, MD;  Location: [facility] OR;  Service: ENT;  Laterality: Left;  . Right eyebrown bone      FAMILY HISTORY Family History  Problem Relation Age of Onset  . Cancer Father     lung  . Diabetes Father   . Cancer Maternal Aunt     gyn cancer, pancreas  . Cancer Paternal Aunt     pancreas cancer  . Cancer Paternal Uncle     unk   HEALTH MAINTENANCE: History  Substance Use Topics  . Smoking status: Current Every Day Smoker -- 0.50 packs/day for 25 years    Types: Cigarettes  . Smokeless tobacco: Not on file  . Alcohol Use: No    Allergies  Allergen Reactions  . Penicillins Hives  . Amoxicillin Rash  . Hydrocodone Rash  Current Outpatient Prescriptions  Medication Sig Dispense Refill  . lidocaine-prilocaine (EMLA) cream Apply 1 application topically daily as needed (for port access).      . ondansetron (ZOFRAN) 8 MG tablet Take 8 mg by mouth every 8 (eight) hours as needed for nausea.       No  current facility-administered medications for this visit.    OBJECTIVE: Filed Vitals:   09/05/12 1325  BP: 119/79  Pulse: 91  Temp: 98.3 F (36.8 C)  Resp: 18     Body mass index is 17.82 kg/(m^2).    ECOG FS: 0  HEENT: Sclerae anicteric.  Conjunctivae were pink. Pupils round and reactive bilaterally. Oral mucosa is moist without ulceration or thrush. No occipital, submandibular, cervical, supraclavicular or axillar adenopathy. Lungs: clear to auscultation without wheezes. No rales or rhonchi. Heart: regular rate and rhythm. No murmur, gallop or rubs. Abdomen: soft, non tender. No guarding or rebound tenderness. Bowel sounds are present. No palpable hepatosplenomegaly. MSK: no focal spinal tenderness. Extremities: No clubbing or cyanosis.No calf tenderness to palpitation, no peripheral edema. The patient had grossly intact strength in upper and lower extremities. Skin exam was without ecchymosis, petechiae.  Neuro: non-focal, alert and oriented to time, person and place, appropriate affect  LAB RESULTS:  CMP     Component Value Date/Time   NA 144 09/04/2012 0855   NA 136 05/25/2012 0605   K 4.7 09/04/2012 0855   K 4.3 05/25/2012 0605   CL 107 07/24/2012 0819   CL 101 05/25/2012 0605   CO2 26 09/04/2012 0855   CO2 32 05/25/2012 0605   GLUCOSE 81 09/04/2012 0855   GLUCOSE 83 07/24/2012 0819   GLUCOSE 101* 05/25/2012 0605   BUN 8.9 09/04/2012 0855   BUN 5* 05/25/2012 0605   CREATININE 0.8 09/04/2012 0855   CREATININE 0.67 05/25/2012 0605   CALCIUM 9.4 09/04/2012 0855   CALCIUM 7.7* 05/25/2012 0605   PROT 7.1 09/04/2012 0855   PROT 5.4* 05/17/2012 0410   ALBUMIN 3.7 09/04/2012 0855   ALBUMIN 2.0* 05/17/2012 0410   AST 18 09/04/2012 0855   AST 19 05/17/2012 0410   ALT 13 09/04/2012 0855   ALT 13 05/17/2012 0410   ALKPHOS 60 09/04/2012 0855   ALKPHOS 44 05/17/2012 0410   BILITOT 0.20 09/04/2012 0855   BILITOT 0.4 05/17/2012 0410   GFRNONAA >90 05/25/2012 0605   GFRAA >90 05/25/2012 0605   Lab  Results  Component Value Date   WBC 8.0 09/04/2012   NEUTROABS 6.0 09/04/2012   HGB 12.9* 09/04/2012   HCT 39.4 09/04/2012   MCV 90.1 09/04/2012   PLT 299 09/04/2012      Chemistry      Component Value Date/Time   NA 144 09/04/2012 0855   NA 136 05/25/2012 0605   K 4.7 09/04/2012 0855   K 4.3 05/25/2012 0605   CL 107 07/24/2012 0819   CL 101 05/25/2012 0605   CO2 26 09/04/2012 0855   CO2 32 05/25/2012 0605   BUN 8.9 09/04/2012 0855   BUN 5* 05/25/2012 0605   CREATININE 0.8 09/04/2012 0855   CREATININE 0.67 05/25/2012 0605      Component Value Date/Time   CALCIUM 9.4 09/04/2012 0855   CALCIUM 7.7* 05/25/2012 0605   ALKPHOS 60 09/04/2012 0855   ALKPHOS 44 05/17/2012 0410   AST 18 09/04/2012 0855   AST 19 05/17/2012 0410   ALT 13 09/04/2012 0855   ALT 13 05/17/2012 0410   BILITOT 0.20 09/04/2012 0855  BILITOT 0.4 05/17/2012 0410      STUDIES: Nm Pet Image Restag (ps) Skull Base To Thigh  09/04/2012   *RADIOLOGY REPORT*  Clinical Data: Subsequent treatment strategy for large B-cell lymphoma. Status post chemotherapy.  NUCLEAR MEDICINE PET SKULL BASE TO THIGH  Fasting Blood Glucose:  88  Technique:  18.0 mCi F-18 FDG was injected intravenously. CT data was obtained and used for attenuation correction and anatomic localization only.  (This was not acquired as a diagnostic CT examination.) Additional exam technical data entered on technologist worksheet.  Comparison:  PET CT dated 05/28/2012  Findings:  Neck: Mild residual hypermetabolism in the left peritonsillar region, max SUV 3.8 (PET image 22).  No hypermetabolic lymph nodes in the neck.  6 mm short-axis left level II node (series 2/image 40).  Near complete opacification of the right maxillary sinus.  Right mastoid effusion.  Chest:  Residual 11 x 18 mm thin-walled cavitary lesion in the left upper lobe with mild surrounding ground-glass opacity, improved. No residual hypermetabolism.  No hypermetabolic mediastinal or hilar nodes.  Abdomen/Pelvis:  No  abnormal hypermetabolic activity within the liver, pancreas, adrenal glands, or spleen.  No hypermetabolic lymph nodes in the abdomen or pelvis.  Skeleton:  No focal hypermetabolic activity to suggest skeletal metastasis.  IMPRESSION: Near complete metabolic response.  Mild residual hypermetabolism in the left peritonsillar region, max SUV 3.8.  No hypermetabolic lymphadenopathy.  Residual 11 x 18 mm thin-walled cavitary lesion in the left upper lobe with mild surrounding ground-glass opacity, improved.   Original Report Authenticated By: Charline Bills, M.D.   Dg Fluoro Guide Lumbar Puncture  08/18/2012   *RADIOLOGY REPORT*  Clinical Data:  42 year old male with diffuse large B-cell lymphoma.  The patient presents for intrathecal chemotherapy injection #3.  FLUOROSCOPICALLY GUIDED LUMBAR PUNCTURE FOR INTRATHECAL CHEMOTHERAPY  Fluoroscopy time:  0-minute-14-second  Technique: Informed consent was obtained from the patient prior to the procedure, including potential complications of headache, allergy, and pain.   A "time-out" was performed.  With the patient prone, the lower back was prepped and draped in the usual sterile fashion with Betadine.  1% Lidocaine was used for local anesthesia.  Using fluoroscopic guidance, lumbar puncture was performed at the L3-L4 level using left sublaminar technique.  A 3.5 inches x 20 gauge spinal needle was advanced into the thecal sac with return of clear, colorless CSF.   2 mL of CSF was collected to use for flush.  Next, 5 mL of a chemotherapy mixture consisting of 12 of Methotrexate and 50 of Hydrocortisone sodium succinate was injected into the subarachnoid space.  This was followed by the CSF flush.  The needle was withdrawn, direct pressure was held in hemostases was noted.  The patient tolerated the procedure well without apparent complication.  The patient was given appropriate post procedural instructions and discharged to home in good condition following a period of  observation.  IMPRESSION: Fluoroscopic-guided lumbar puncture at L3-L4 with intrathecal administration of chemotherapy, as detailed above.   Original Report Authenticated By: Erskine Speed, M.D.   ASSESSMENT AND PLAN:  1. Stage II DLBCL:  - Doing well after 4 cycles of R-CHOP   PET from 09/04/2012:  Near complete metabolic response.  Mild residual hypermetabolism in the left peritonsillar region, max SUV 3.8 - He has no dose limiting toxicity.  - I recommended to proceed with the 5th cycle of chemo without dose modification.  3. Follow up: in 3 weeks after 1st day of cycle 5.  The length of time of the face-to-face encounter was 25 minutes. More than 50% of time was spent counseling and coordination of care.   Myra Rude, MD   09/07/2012 11:57 PM

## 2012-09-08 ENCOUNTER — Telehealth: Payer: Self-pay | Admitting: Hematology and Oncology

## 2012-09-08 NOTE — Telephone Encounter (Signed)
S/w the pt and he is aware of his appts on 09/12/2012 and to pick up the rest of his appt schedules at that time.

## 2012-09-12 ENCOUNTER — Other Ambulatory Visit: Payer: Self-pay | Admitting: Oncology

## 2012-09-12 ENCOUNTER — Other Ambulatory Visit (HOSPITAL_BASED_OUTPATIENT_CLINIC_OR_DEPARTMENT_OTHER): Payer: Medicaid Other | Admitting: Lab

## 2012-09-12 ENCOUNTER — Ambulatory Visit (HOSPITAL_BASED_OUTPATIENT_CLINIC_OR_DEPARTMENT_OTHER): Payer: Medicaid Other

## 2012-09-12 ENCOUNTER — Telehealth: Payer: Self-pay | Admitting: Hematology and Oncology

## 2012-09-12 ENCOUNTER — Ambulatory Visit: Payer: Medicaid Other | Admitting: Nutrition

## 2012-09-12 VITALS — BP 101/57 | HR 66 | Temp 97.8°F | Resp 18

## 2012-09-12 DIAGNOSIS — Z5111 Encounter for antineoplastic chemotherapy: Secondary | ICD-10-CM

## 2012-09-12 DIAGNOSIS — C8589 Other specified types of non-Hodgkin lymphoma, extranodal and solid organ sites: Secondary | ICD-10-CM

## 2012-09-12 DIAGNOSIS — C8330 Diffuse large B-cell lymphoma, unspecified site: Secondary | ICD-10-CM

## 2012-09-12 DIAGNOSIS — Z5112 Encounter for antineoplastic immunotherapy: Secondary | ICD-10-CM

## 2012-09-12 LAB — CBC WITH DIFFERENTIAL/PLATELET
EOS%: 1.3 % (ref 0.0–7.0)
LYMPH%: 32.3 % (ref 14.0–49.0)
MCH: 29.4 pg (ref 27.2–33.4)
MCHC: 32 g/dL (ref 32.0–36.0)
MCV: 91.8 fL (ref 79.3–98.0)
MONO%: 22.5 % — ABNORMAL HIGH (ref 0.0–14.0)
Platelets: 309 10*3/uL (ref 140–400)
RBC: 4.63 10*6/uL (ref 4.20–5.82)
RDW: 19.1 % — ABNORMAL HIGH (ref 11.0–14.6)
nRBC: 0 % (ref 0–0)

## 2012-09-12 LAB — COMPREHENSIVE METABOLIC PANEL (CC13)
ALT: 12 U/L (ref 0–55)
Alkaline Phosphatase: 59 U/L (ref 40–150)
Creatinine: 0.8 mg/dL (ref 0.7–1.3)
Glucose: 117 mg/dl (ref 70–140)
Sodium: 138 mEq/L (ref 136–145)
Total Bilirubin: 0.35 mg/dL (ref 0.20–1.20)
Total Protein: 6.7 g/dL (ref 6.4–8.3)

## 2012-09-12 MED ORDER — SODIUM CHLORIDE 0.9 % IJ SOLN
10.0000 mL | INTRAMUSCULAR | Status: DC | PRN
  Administered 2012-09-12: 10 mL
  Filled 2012-09-12: qty 10

## 2012-09-12 MED ORDER — ACETAMINOPHEN 325 MG PO TABS
650.0000 mg | ORAL_TABLET | Freq: Once | ORAL | Status: AC
  Administered 2012-09-12: 650 mg via ORAL

## 2012-09-12 MED ORDER — DOXORUBICIN HCL CHEMO IV INJECTION 2 MG/ML
50.0000 mg/m2 | Freq: Once | INTRAVENOUS | Status: AC
  Administered 2012-09-12: 72 mg via INTRAVENOUS
  Filled 2012-09-12: qty 36

## 2012-09-12 MED ORDER — VINCRISTINE SULFATE CHEMO INJECTION 1 MG/ML
2.0000 mg | Freq: Once | INTRAVENOUS | Status: AC
  Administered 2012-09-12: 2 mg via INTRAVENOUS
  Filled 2012-09-12: qty 2

## 2012-09-12 MED ORDER — HEPARIN SOD (PORK) LOCK FLUSH 100 UNIT/ML IV SOLN
500.0000 [IU] | Freq: Once | INTRAVENOUS | Status: AC | PRN
  Administered 2012-09-12: 500 [IU]
  Filled 2012-09-12: qty 5

## 2012-09-12 MED ORDER — SODIUM CHLORIDE 0.9 % IV SOLN
375.0000 mg/m2 | Freq: Once | INTRAVENOUS | Status: AC
  Administered 2012-09-12: 600 mg via INTRAVENOUS
  Filled 2012-09-12: qty 60

## 2012-09-12 MED ORDER — DEXAMETHASONE SODIUM PHOSPHATE 20 MG/5ML IJ SOLN
20.0000 mg | Freq: Once | INTRAMUSCULAR | Status: AC
  Administered 2012-09-12: 20 mg via INTRAVENOUS

## 2012-09-12 MED ORDER — SODIUM CHLORIDE 0.9 % IV SOLN
Freq: Once | INTRAVENOUS | Status: AC
  Administered 2012-09-12: 10:00:00 via INTRAVENOUS

## 2012-09-12 MED ORDER — SODIUM CHLORIDE 0.9 % IV SOLN
750.0000 mg/m2 | Freq: Once | INTRAVENOUS | Status: AC
  Administered 2012-09-12: 1080 mg via INTRAVENOUS
  Filled 2012-09-12: qty 54

## 2012-09-12 MED ORDER — ONDANSETRON 16 MG/50ML IVPB (CHCC)
16.0000 mg | Freq: Once | INTRAVENOUS | Status: AC
  Administered 2012-09-12: 16 mg via INTRAVENOUS

## 2012-09-12 MED ORDER — DIPHENHYDRAMINE HCL 25 MG PO CAPS
50.0000 mg | ORAL_CAPSULE | Freq: Once | ORAL | Status: AC
  Administered 2012-09-12: 50 mg via ORAL

## 2012-09-12 NOTE — Progress Notes (Signed)
Patient reports he is doing well.  He denies nutrition side effects except for slight nausea the day after his shot.  Patient reports he is eating much more than he was and his weight has increased to 113.8 pounds documented August 1, which is increased from 104.6 pounds on July 10.  Patient declines assistance with oral nutrition supplements and coupons.  Nutrition diagnosis: Malnutrition, improved.  Intervention: I provided support and encouragement for patient to continue increased oral intake for continued weight gain.  Patient was educated to contact me for further needs.  Next visit: There is no followup scheduled however patient has my contact information.

## 2012-09-12 NOTE — Patient Instructions (Signed)
[facility] Discharge Instructions for Patients Receiving Chemotherapy  Today you received the following chemotherapy agents: Adriamycin, Vincristine, Cytoxan, Rituxan  To help prevent nausea and vomiting after your treatment, we encourage you to take your nausea medication as instructed by your physician.    If you develop nausea and vomiting that is not controlled by your nausea medication, call the clinic.   BELOW ARE SYMPTOMS THAT SHOULD BE REPORTED IMMEDIATELY:  *FEVER GREATER THAN 100.5 F  *CHILLS WITH OR WITHOUT FEVER  NAUSEA AND VOMITING THAT IS NOT CONTROLLED WITH YOUR NAUSEA MEDICATION  *UNUSUAL SHORTNESS OF BREATH  *UNUSUAL BRUISING OR BLEEDING  TENDERNESS IN MOUTH AND THROAT WITH OR WITHOUT PRESENCE OF ULCERS  *URINARY PROBLEMS  *BOWEL PROBLEMS  UNUSUAL RASH Items with * indicate a potential emergency and should be followed up as soon as possible.  Feel free to call the clinic you have any questions or concerns. The clinic phone number is [phone].    

## 2012-09-12 NOTE — Telephone Encounter (Signed)
Added inj appt for 8/30 and confirmed inj appt for 8/9 and 8/30 w/pt. Pt may not need intrathecal chemo appts @ [facility] and [facility] will f/u w/Nicholas King on Monday re if these appts for 8/12 and 9/2 should be cx'd as I will be out of office. Pt aware of other [facility] appts.

## 2012-09-13 ENCOUNTER — Ambulatory Visit (HOSPITAL_BASED_OUTPATIENT_CLINIC_OR_DEPARTMENT_OTHER): Payer: Medicaid Other

## 2012-09-13 VITALS — BP 121/73 | HR 101 | Temp 98.2°F | Resp 20

## 2012-09-13 DIAGNOSIS — C8589 Other specified types of non-Hodgkin lymphoma, extranodal and solid organ sites: Secondary | ICD-10-CM

## 2012-09-13 DIAGNOSIS — C8330 Diffuse large B-cell lymphoma, unspecified site: Secondary | ICD-10-CM

## 2012-09-13 DIAGNOSIS — Z5189 Encounter for other specified aftercare: Secondary | ICD-10-CM

## 2012-09-13 MED ORDER — PEGFILGRASTIM INJECTION 6 MG/0.6ML
6.0000 mg | Freq: Once | SUBCUTANEOUS | Status: AC
  Administered 2012-09-13: 6 mg via SUBCUTANEOUS

## 2012-09-16 ENCOUNTER — Inpatient Hospital Stay (HOSPITAL_COMMUNITY): Admission: RE | Admit: 2012-09-16 | Payer: Medicaid Other | Source: Ambulatory Visit

## 2012-09-16 ENCOUNTER — Ambulatory Visit (HOSPITAL_COMMUNITY): Admission: RE | Admit: 2012-09-16 | Payer: Medicaid Other | Source: Ambulatory Visit

## 2012-09-16 ENCOUNTER — Telehealth: Payer: Self-pay | Admitting: *Deleted

## 2012-09-16 NOTE — Telephone Encounter (Signed)
Notified by Radiology that pt did not show up for Intrathecal MTX appt today.  They called pt and he said he was unaware of appt..  Notified Dr. Alecia Lemming and he gave order ok to skip this dose if pt desires.  If pt does want to have intrathecal, then ok to r/s the appt w/i the next few days.    Called pt to discuss above. Left VM on home and cell phone for him to please return Nurse call to discuss this appt.

## 2012-09-30 ENCOUNTER — Encounter (HOSPITAL_COMMUNITY): Payer: Self-pay | Admitting: Pharmacy Technician

## 2012-10-03 ENCOUNTER — Ambulatory Visit (HOSPITAL_BASED_OUTPATIENT_CLINIC_OR_DEPARTMENT_OTHER): Payer: Medicaid Other

## 2012-10-03 ENCOUNTER — Other Ambulatory Visit (HOSPITAL_BASED_OUTPATIENT_CLINIC_OR_DEPARTMENT_OTHER): Payer: Medicaid Other

## 2012-10-03 ENCOUNTER — Other Ambulatory Visit: Payer: Self-pay

## 2012-10-03 ENCOUNTER — Ambulatory Visit (HOSPITAL_BASED_OUTPATIENT_CLINIC_OR_DEPARTMENT_OTHER): Payer: Medicaid Other | Admitting: Hematology and Oncology

## 2012-10-03 VITALS — BP 108/72 | HR 76 | Temp 97.9°F | Resp 18 | Ht 67.0 in | Wt 108.2 lb

## 2012-10-03 VITALS — BP 95/52 | HR 56 | Temp 97.6°F | Resp 18

## 2012-10-03 DIAGNOSIS — C8330 Diffuse large B-cell lymphoma, unspecified site: Secondary | ICD-10-CM

## 2012-10-03 DIAGNOSIS — C8589 Other specified types of non-Hodgkin lymphoma, extranodal and solid organ sites: Secondary | ICD-10-CM

## 2012-10-03 DIAGNOSIS — Z5112 Encounter for antineoplastic immunotherapy: Secondary | ICD-10-CM

## 2012-10-03 LAB — CBC WITH DIFFERENTIAL/PLATELET
BASO%: 1 % (ref 0.0–2.0)
Basophils Absolute: 0.1 10*3/uL (ref 0.0–0.1)
EOS%: 0 % (ref 0.0–7.0)
HCT: 43.2 % (ref 38.4–49.9)
HGB: 13.9 g/dL (ref 13.0–17.1)
MCH: 30.1 pg (ref 27.2–33.4)
MCHC: 32.2 g/dL (ref 32.0–36.0)
MCV: 93.5 fL (ref 79.3–98.0)
MONO%: 17.9 % — ABNORMAL HIGH (ref 0.0–14.0)
NEUT%: 65.9 % (ref 39.0–75.0)
lymph#: 0.8 10*3/uL — ABNORMAL LOW (ref 0.9–3.3)

## 2012-10-03 LAB — COMPREHENSIVE METABOLIC PANEL (CC13)
Albumin: 3.7 g/dL (ref 3.5–5.0)
BUN: 12.8 mg/dL (ref 7.0–26.0)
Calcium: 9 mg/dL (ref 8.4–10.4)
Creatinine: 0.7 mg/dL (ref 0.7–1.3)
Total Bilirubin: 0.24 mg/dL (ref 0.20–1.20)
Total Protein: 6.8 g/dL (ref 6.4–8.3)

## 2012-10-03 MED ORDER — ACETAMINOPHEN 325 MG PO TABS
650.0000 mg | ORAL_TABLET | Freq: Once | ORAL | Status: AC
  Administered 2012-10-03: 650 mg via ORAL

## 2012-10-03 MED ORDER — SODIUM CHLORIDE 0.9 % IV SOLN
750.0000 mg/m2 | Freq: Once | INTRAVENOUS | Status: AC
  Administered 2012-10-03: 1080 mg via INTRAVENOUS
  Filled 2012-10-03: qty 54

## 2012-10-03 MED ORDER — ONDANSETRON 16 MG/50ML IVPB (CHCC)
16.0000 mg | Freq: Once | INTRAVENOUS | Status: AC
  Administered 2012-10-03: 16 mg via INTRAVENOUS

## 2012-10-03 MED ORDER — VINCRISTINE SULFATE CHEMO INJECTION 1 MG/ML
2.0000 mg | Freq: Once | INTRAVENOUS | Status: AC
  Administered 2012-10-03: 2 mg via INTRAVENOUS
  Filled 2012-10-03: qty 2

## 2012-10-03 MED ORDER — HEPARIN SOD (PORK) LOCK FLUSH 100 UNIT/ML IV SOLN
500.0000 [IU] | Freq: Once | INTRAVENOUS | Status: AC | PRN
  Administered 2012-10-03: 500 [IU]
  Filled 2012-10-03: qty 5

## 2012-10-03 MED ORDER — SODIUM CHLORIDE 0.9 % IJ SOLN
10.0000 mL | INTRAMUSCULAR | Status: DC | PRN
  Administered 2012-10-03: 10 mL
  Filled 2012-10-03: qty 10

## 2012-10-03 MED ORDER — SODIUM CHLORIDE 0.9 % IV SOLN
375.0000 mg/m2 | Freq: Once | INTRAVENOUS | Status: AC
  Administered 2012-10-03: 600 mg via INTRAVENOUS
  Filled 2012-10-03: qty 60

## 2012-10-03 MED ORDER — DOXORUBICIN HCL CHEMO IV INJECTION 2 MG/ML
50.0000 mg/m2 | Freq: Once | INTRAVENOUS | Status: AC
  Administered 2012-10-03: 72 mg via INTRAVENOUS
  Filled 2012-10-03: qty 36

## 2012-10-03 MED ORDER — DEXAMETHASONE SODIUM PHOSPHATE 20 MG/5ML IJ SOLN
20.0000 mg | Freq: Once | INTRAMUSCULAR | Status: AC
  Administered 2012-10-03: 20 mg via INTRAVENOUS

## 2012-10-03 MED ORDER — DIPHENHYDRAMINE HCL 25 MG PO CAPS
50.0000 mg | ORAL_CAPSULE | Freq: Once | ORAL | Status: AC
  Administered 2012-10-03: 50 mg via ORAL

## 2012-10-03 MED ORDER — SODIUM CHLORIDE 0.9 % IV SOLN
Freq: Once | INTRAVENOUS | Status: AC
  Administered 2012-10-03: 10:00:00 via INTRAVENOUS

## 2012-10-03 NOTE — Patient Instructions (Addendum)
[facility] Discharge Instructions for Patients Receiving Chemotherapy  Today you received the following chemotherapy agents Adriamycin/Vincristine/Cytoxan/Rituxan.  To help prevent nausea and vomiting after your treatment, we encourage you to take your nausea medication as prescribed.   If you develop nausea and vomiting that is not controlled by your nausea medication, call the clinic.   BELOW ARE SYMPTOMS THAT SHOULD BE REPORTED IMMEDIATELY:  *FEVER GREATER THAN 100.5 F  *CHILLS WITH OR WITHOUT FEVER  NAUSEA AND VOMITING THAT IS NOT CONTROLLED WITH YOUR NAUSEA MEDICATION  *UNUSUAL SHORTNESS OF BREATH  *UNUSUAL BRUISING OR BLEEDING  TENDERNESS IN MOUTH AND THROAT WITH OR WITHOUT PRESENCE OF ULCERS  *URINARY PROBLEMS  *BOWEL PROBLEMS  UNUSUAL RASH Items with * indicate a potential emergency and should be followed up as soon as possible.  Feel free to call the clinic you have any questions or concerns. The clinic phone number is [phone].    

## 2012-10-04 ENCOUNTER — Ambulatory Visit (HOSPITAL_BASED_OUTPATIENT_CLINIC_OR_DEPARTMENT_OTHER): Payer: Medicaid Other

## 2012-10-04 VITALS — BP 107/67 | HR 84 | Temp 98.0°F

## 2012-10-04 DIAGNOSIS — C8589 Other specified types of non-Hodgkin lymphoma, extranodal and solid organ sites: Secondary | ICD-10-CM

## 2012-10-04 DIAGNOSIS — Z5189 Encounter for other specified aftercare: Secondary | ICD-10-CM

## 2012-10-04 DIAGNOSIS — C8330 Diffuse large B-cell lymphoma, unspecified site: Secondary | ICD-10-CM

## 2012-10-04 MED ORDER — PEGFILGRASTIM INJECTION 6 MG/0.6ML
6.0000 mg | Freq: Once | SUBCUTANEOUS | Status: AC
  Administered 2012-10-04: 6 mg via SUBCUTANEOUS

## 2012-10-07 ENCOUNTER — Ambulatory Visit (HOSPITAL_COMMUNITY): Admission: RE | Admit: 2012-10-07 | Payer: Medicaid Other | Source: Ambulatory Visit

## 2012-10-07 ENCOUNTER — Inpatient Hospital Stay (HOSPITAL_COMMUNITY): Admission: RE | Admit: 2012-10-07 | Payer: Medicaid Other | Source: Ambulatory Visit

## 2012-10-16 NOTE — Progress Notes (Signed)
Lymphoma Location(s) / Histology: left tonsil  Patient presented 7 months ago with symptoms of: hoarseness, dysphagia w/solids, left-sided sore throat  Biopsies of pharynx (if applicable) revealed:  05/08/12 Interpretation 1. Pharynx, biopsy, Posterior wall LARGE B-CELL LYMPHOMA. 2. Tissue-Flow Cytometry, Left posterior pharyngeal wall LARGE B-CELL LYMPHOMA. 3. Pharynx, biopsy, Right posterior pharyngeal wall LARGE B-CELL LYMPHOMA.  Past/Anticipated interventions by medical oncology, if any: started chemo R-CHOP on 06/09/2012; q3 weeks; for goal of chemo at least 4 cycles depending on follow up interval PET scan; Neulasta on day # 2 of each chemo cycle; possible consolidative radiation; and intrathecal prophylactic chemo with cycle #2 onward.    Weight changes, if any, over the past 6 months: initially 40 lb weight loss, 12 lb weight gain since April 2014  Recurrent fevers, or drenching night sweats, if any: fever 101 x few days in past week, denied night sweats  SAFETY ISSUES:  Prior radiation? no  Pacemaker/ICD? no  Possible current pregnancy? na  Is the patient on methotrexate? 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

## 2012-10-16 NOTE — Progress Notes (Signed)
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   INTERVAL HISTORY:  Nicholas King NEE 42 y.o. male returns for regular follow up visit. He reported general ache all over his body and fever 34' F.for couple of days. He reported dysphagia and odynophagia but it is better than it was before. Patient complains on nasal congestion and nasal discharge and sore throat when he took prednisone. He has numbness on the top of his legs. He has diminished hearing on right side secondary to history of infection.  The patient denied chills, night sweats, change in appetite or weight. He denied headaches, double vision, blurry vision. No chest pain, palpitations, dyspnea, cough, abdominal pain, nausea, vomiting, diarrhea, constipation, hematochezia. The patient denied dysuria, nocturia, polyuria, hematuria, myalgia, tingling, psychiatric problems.  Review of Systems  Constitutional: Positive for fever. Negative for chills, weight loss, malaise/fatigue and diaphoresis.  HENT: Positive for hearing loss, congestion and sore throat. Negative for ear pain, nosebleeds, neck pain and tinnitus.   Eyes: Negative for blurred vision, double vision, photophobia and pain.  Respiratory: Negative for cough, hemoptysis, sputum production, shortness of breath, wheezing and stridor.   Cardiovascular: Negative for chest pain, palpitations, orthopnea, claudication, leg swelling and PND.    Gastrointestinal: Negative for heartburn, nausea, vomiting, abdominal pain, diarrhea, constipation, blood in stool and melena.  Genitourinary: Negative for dysuria, urgency, frequency, hematuria and flank pain.  Musculoskeletal: Negative for myalgias, back pain and joint pain.  Skin: Negative for itching and rash.  Neurological: Positive for sensory change. Negative for dizziness, tingling, tremors, speech change, focal weakness, seizures, loss of consciousness, weakness and headaches.  Endo/Heme/Allergies: Does not bruise/bleed easily.  Psychiatric/Behavioral: Negative.     PAST MEDICAL HISTORY: Past Medical History  Diagnosis Date  . GERD (gastroesophageal reflux disease)   . Cancer   . Lymphoma   . Dysphagia, unspecified(787.20) 05/16/2012  . Pneumonia     April 2014  . 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 No past surgeries    . Direct laryngoscopy Left 05/08/2012    Procedure: DIRECT LARYNGOSCOPY with biopsy left tonsil;  Surgeon: Suzanna Obey, MD;  [facility];  Service: ENT;  Laterality: Left;  . Right eyebrown bone      FAMILY HISTORY Family History  Problem Relation Age of Onset  . Cancer Father     lung  . Diabetes Father   . Cancer Maternal Aunt     gyn cancer, pancreas  . Cancer Paternal Aunt     pancreas cancer  . Cancer Paternal Uncle     unk   HEALTH MAINTENANCE: History  Substance Use Topics  . Smoking status: Current Every Day Smoker -- 0.50 packs/day for 25 years    Types: Cigarettes  . Smokeless tobacco: Not on file  . Alcohol Use: No    Allergies  Allergen Reactions  . Penicillins Hives  . Amoxicillin  Rash  . Hydrocodone Rash    Current Outpatient Prescriptions  Medication Sig Dispense Refill  . lidocaine-prilocaine (EMLA) cream Apply 1 application topically daily as needed (for port access).      . ondansetron (ZOFRAN) 8 MG tablet Take 8 mg by mouth every  8 (eight) hours as needed for nausea.      . predniSONE (DELTASONE) 20 MG tablet Take 60 mg by mouth daily. Takes for 5 days after chemo       No current facility-administered medications for this visit.    OBJECTIVE: Filed Vitals:   10/03/12 0825  BP: 108/72  Pulse: 76  Temp: 97.9 F (36.6 C)  Resp: 18     Body mass index is 16.94 kg/(m^2).    ECOG FS: 0  PHYSICAL EXAMINATION:  HEENT: Sclerae anicteric.  Conjunctivae were pink. Pupils round and reactive bilaterally. Oral mucosa is moist without ulceration or thrush. No occipital, submandibular, cervical, supraclavicular or axillar adenopathy. Lungs: clear to auscultation without wheezes. No rales or rhonchi. Heart: regular rate and rhythm. No murmur, gallop or rubs. Abdomen: soft, non tender. No guarding or rebound tenderness. Bowel sounds are present. No palpable hepatosplenomegaly. MSK: no focal spinal tenderness. Extremities: No clubbing or cyanosis.No calf tenderness to palpitation, no peripheral edema. The patient had grossly intact strength in upper and lower extremities. Skin exam was without ecchymosis, petechiae. Neuro: non-focal, alert and oriented to time, person and place, appropriate affect  LAB RESULTS:  CMP     Component Value Date/Time   NA 140 10/03/2012 0806   NA 136 05/25/2012 0605   K 4.4 10/03/2012 0806   K 4.3 05/25/2012 0605   CL 107 07/24/2012 0819   CL 101 05/25/2012 0605   CO2 24 10/03/2012 0806   CO2 32 05/25/2012 0605   GLUCOSE 98 10/03/2012 0806   GLUCOSE 83 07/24/2012 0819   GLUCOSE 101* 05/25/2012 0605   BUN 12.8 10/03/2012 0806   BUN 5* 05/25/2012 0605   CREATININE 0.7 10/03/2012 0806   CREATININE 0.67 05/25/2012 0605   CALCIUM 9.0 10/03/2012 0806   CALCIUM 7.7* 05/25/2012 0605   PROT 6.8 10/03/2012 0806   PROT 5.4* 05/17/2012 0410   ALBUMIN 3.7 10/03/2012 0806   ALBUMIN 2.0* 05/17/2012 0410   AST 15 10/03/2012 0806   AST 19 05/17/2012 0410   ALT 12 10/03/2012 0806   ALT 13 05/17/2012 0410   ALKPHOS 50  10/03/2012 0806   ALKPHOS 44 05/17/2012 0410   BILITOT 0.24 10/03/2012 0806   BILITOT 0.4 05/17/2012 0410   GFRNONAA >90 05/25/2012 0605   GFRAA >90 05/25/2012 0605    Lab Results  Component Value Date   WBC 5.2 10/03/2012   NEUTROABS 3.4 10/03/2012   HGB 13.9 10/03/2012   HCT 43.2 10/03/2012   MCV 93.5 10/03/2012   PLT 257 10/03/2012      Chemistry      Component Value Date/Time   NA 140 10/03/2012 0806   NA 136 05/25/2012 0605   K 4.4 10/03/2012 0806   K 4.3 05/25/2012 0605   CL 107 07/24/2012 0819   CL 101 05/25/2012 0605   CO2 24 10/03/2012 0806   CO2 32 05/25/2012 0605   BUN 12.8 10/03/2012 0806   BUN 5* 05/25/2012 0605   CREATININE 0.7 10/03/2012 0806   CREATININE 0.67 05/25/2012 0605      Component Value Date/Time   CALCIUM 9.0 10/03/2012 0806   CALCIUM 7.7* 05/25/2012 0605   ALKPHOS 50 10/03/2012 0806   ALKPHOS  44 05/17/2012 0410   AST 15 10/03/2012 0806   AST 19 05/17/2012 0410   ALT 12 10/03/2012 0806   ALT 13 05/17/2012 0410   BILITOT 0.24 10/03/2012 0806   BILITOT 0.4 05/17/2012 0410      STUDIES: No results found.  ASSESSMENT AND PLAN: 1. Stage II DLBCL:  - Doing well after 5 cycles of R-CHOP . He did not had  IT methotrexate injection with cycle 5.  - He has no dose limiting toxicity.  - We will proceed with the 6th cycle of chemo without dose modification and without IT methotrexate. 2.. Follow up With Rad/Onc for possible XRT in 2 weeks. PET from 09/04/2012: Near complete metabolic response. Mild residual hypermetabolism in the left peritonsillar region, max SUV 3.8.  NCCN guidelines recommended PET CT as pre RT treatment evaluation with rebiopsy if positive. (page BCEL-4, v4.2014)  I will leave this decision up to Rad Onc and patient. 3. Follow up in 3 -4 weeks   Modene Andy, MD   10/16/2012 12:19 AM

## 2012-10-17 ENCOUNTER — Encounter: Payer: Self-pay | Admitting: Radiation Oncology

## 2012-10-17 ENCOUNTER — Ambulatory Visit
Admission: RE | Admit: 2012-10-17 | Discharge: 2012-10-17 | Disposition: A | Discharge status: Home / Self Care | Payer: Medicaid Other | Source: Ambulatory Visit | Attending: Radiation Oncology | Admitting: Radiation Oncology

## 2012-10-17 VITALS — BP 115/70 | HR 85 | Temp 98.8°F | Wt 110.0 lb

## 2012-10-17 DIAGNOSIS — C8330 Diffuse large B-cell lymphoma, unspecified site: Secondary | ICD-10-CM

## 2012-10-17 DIAGNOSIS — F17200 Nicotine dependence, unspecified, uncomplicated: Secondary | ICD-10-CM | POA: Insufficient documentation

## 2012-10-17 DIAGNOSIS — C8589 Other specified types of non-Hodgkin lymphoma, extranodal and solid organ sites: Secondary | ICD-10-CM | POA: Insufficient documentation

## 2012-10-17 DIAGNOSIS — R4789 Other speech disturbances: Secondary | ICD-10-CM | POA: Insufficient documentation

## 2012-10-17 DIAGNOSIS — Z9221 Personal history of antineoplastic chemotherapy: Secondary | ICD-10-CM | POA: Insufficient documentation

## 2012-10-17 DIAGNOSIS — K219 Gastro-esophageal reflux disease without esophagitis: Secondary | ICD-10-CM | POA: Insufficient documentation

## 2012-10-17 NOTE — Progress Notes (Signed)
Please see the Nurse Progress Note in the MD Initial Consult Encounter for this patient. 

## 2012-10-17 NOTE — Addendum Note (Signed)
Encounter addended by: Tessa Lerner, RN on: 10/17/2012  1:12 PM<BR>     Documentation filed: Charges VN

## 2012-10-17 NOTE — Progress Notes (Signed)
Called patient and gave him appointment date and time for PET scan.

## 2012-10-17 NOTE — Progress Notes (Signed)
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 male  who presented to ENT with a sore throats 40 pound weight loss and fever. He underwent a biopsy by Dr. Jearld Fenton on 05/08/2012 showing diffuse large B-cell lymphoma in the posterior pharynx and right posterior pharyngeal wall. He was noted on exam to have swelling of the left tonsil. At that point he was just drinking boost and Ensure. He denied any palpable adenopathy. He did note that his breast had become particularly foul-smelling. A CT of the neck at that time showed asymmetric soft tissue swelling in the left tonsil with no pathologic adenopathy. Scattered lymph nodes were present in the bilateral neck. An irregular right thyroid nodule was noted. This was ultimately biopsied and was found to be benign. Further workup including a PET/CT scan was performed which showed diffuse thickening and hypermetabolic activity of the nasopharynx and oropharynx extending into the cervical nodes and one be in level IV lymph nodes on the left. These are extremely hypermetabolic. There were areas of cavitation on the chest CT concerning for infection. A bone marrow biopsy was negative. He underwent 4 cycles of chemotherapy and had a restaging PET scan performed on 09/04/2012. This showed an almost complete response with mild residual hypermetabolic activity in the left peritonsillar region with an SUV of 3.8. He then underwent 2 more cycles of chemotherapy with R. CHOP and has completed 3 cycles of intrathecal chemotherapy as well. He presents today for my opinion regarding radiation in the management of his stage IIB diffuse large B-cell lymphoma. He still has  problems swallowing. He states food seems to get stuck and he can't make it go down. He regurgitates solid food if it is too big and has to swallow very slowly any liquids. He's not having any pain. He is on disability but would like to go back to work once is back in shape. He is accompanied by his sister today. He's not having any night sweats or chills now. No fevers. He has some residual numbness over his bilateral thighs and occasional back pain from his intrathecal chemotherapy.  PREVIOUS RADIATION THERAPY: No  PAST MEDICAL HISTORY:  has a past medical history of GERD (gastroesophageal reflux disease); Cancer; Lymphoma; Dysphagia, unspecified(787.20) (05/16/2012); Pneumonia; and Post lumbar puncture headache (07/03/2012).    PAST SURGICAL HISTORY: Past Surgical History  Procedure Laterality Date  . No past surgeries    . Direct laryngoscopy Left 05/08/2012    Procedure: DIRECT LARYNGOSCOPY with biopsy left tonsil;  Surgeon: Suzanna Obey, MD;  Location: [facility] OR;  Service: ENT;  Laterality: Left;  . Right eyebrown bone      FAMILY HISTORY:  Family History  Problem Relation Age of Onset  . Cancer Father     lung  . Diabetes Father   . Cancer Maternal Aunt     gyn cancer, pancreas  . Cancer Paternal Aunt     pancreas cancer  . Cancer Paternal Uncle     unk    SOCIAL HISTORY:  History  Substance Use Topics  . Smoking status: Current Every Day Smoker -- 0.50 packs/day for 25 years    Types: Cigarettes  . Smokeless tobacco: Not on file  .  Alcohol Use: No    ALLERGIES: Penicillins; Amoxicillin; and Hydrocodone  MEDICATIONS:  Current Outpatient Prescriptions  Medication Sig Dispense Refill  . lidocaine-prilocaine (EMLA) cream Apply 1 application topically daily as needed (for port access).      . ondansetron (ZOFRAN) 8 MG tablet Take 8 mg by mouth every 8 (eight) hours as needed for nausea.      . predniSONE (DELTASONE) 20 MG tablet Take 60 mg by mouth daily. Takes for 5 days after  chemo       No current facility-administered medications for this encounter.    REVIEW OF SYSTEMS:  A 15 point review of systems is documented in the electronic medical record. This was obtained by the nursing staff. However, I reviewed this with the patient to discuss relevant findings and make appropriate changes.  Pertinent items are noted in HPI.  PHYSICAL EXAM:  Filed Vitals:   10/17/12 0850  BP: 115/70  Pulse: 85  Temp: 98.8 F (37.1 C)  .110 lb (49.896 kg). ECoG performance status 1 he is a pleasant male in no distress sitting comfortably on examining room chair. He has no palpable cervical or subclavicular adenopathy. His oropharynx and oral cavity are benign without evidence of thrush or lesions. He has no palpable axillary adenopathy. He is alert and oriented x3.  LABORATORY DATA:  Lab Results  Component Value Date   WBC 5.2 10/03/2012   HGB 13.9 10/03/2012   HCT 43.2 10/03/2012   MCV 93.5 10/03/2012   PLT 257 10/03/2012   Lab Results  Component Value Date   NA 140 10/03/2012   K 4.4 10/03/2012   CL 107 07/24/2012   CO2 24 10/03/2012   Lab Results  Component Value Date   ALT 12 10/03/2012   AST 15 10/03/2012   ALKPHOS 50 10/03/2012   BILITOT 0.24 10/03/2012     RADIOGRAPHY: No results found.    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 CHOP chemotherapy now status post 2 additional cycles  PLAN: I spoke with Mr. Tufo regarding the role of radiation in decreasing local failures and improving disease-free survival in patients who undergo chemotherapy for non-Hodgkin's lymphoma. We discussed that there likely was no survival benefit but could prevent him from having a recurrence which is usually treated with high-dose chemotherapy and bone marrow transplant at recurrence. We discussed 3-4 weeks of treatment as an outpatient. We discussed the use of I MRT for sparing critical structures in the head neck region  including his parotid gland spinal cord and normal tissues. We discussed the possibility of mucositis and hair loss. We discussed the possibility of skin redness and fatigue. We discussed the possibility of damage to any critical normal structures within the neck or throat. He has signed informed consent and agree to proceed forward. I would like to obtain a pre-radiation PET scan in order to determine his radiation dose. I believe it would be fine to treat him with 30 gray if he has a complete response but may increase that to 40 Wallace Cullens if he still has residual activity. I've also referred him to speech therapy for his dysphasia. We discussed that without therapy this could even get worse during radiation and after. I've also referred him to her head and neck cancer navigator.  I spent 40 minutes  face to face with the patient and more than 50% of that time was spent in counseling and/or coordination of care.   ------------------------------------------------  Thea Silversmith, MD

## 2012-10-21 ENCOUNTER — Ambulatory Visit
Admission: RE | Admit: 2012-10-21 | Discharge: 2012-10-21 | Disposition: A | Discharge status: Home / Self Care | Payer: Medicaid Other | Source: Ambulatory Visit | Attending: Radiation Oncology | Admitting: Radiation Oncology

## 2012-10-21 ENCOUNTER — Encounter: Payer: Self-pay | Admitting: *Deleted

## 2012-10-21 DIAGNOSIS — K121 Other forms of stomatitis: Secondary | ICD-10-CM | POA: Insufficient documentation

## 2012-10-21 DIAGNOSIS — C8589 Other specified types of non-Hodgkin lymphoma, extranodal and solid organ sites: Secondary | ICD-10-CM | POA: Insufficient documentation

## 2012-10-21 DIAGNOSIS — C8330 Diffuse large B-cell lymphoma, unspecified site: Secondary | ICD-10-CM

## 2012-10-21 DIAGNOSIS — Z510 Encounter for antineoplastic radiation therapy: Secondary | ICD-10-CM | POA: Insufficient documentation

## 2012-10-21 NOTE — Progress Notes (Signed)
[facility] Radiation Oncology Complex Simulation/Treatment Planning/IMRT note   Nicholas King  [id number] 10/21/2012  [birth year]  Lymphoma of the neck   CONSENT VERIFIED:yes   SET UP: Patient is set-up supine   IMMOBILIZATION: The following immobilization is used:Aquaplast Mask   NARRATIVE:The patient was brought to the CT Simulation planning suite.  Identity was confirmed.  All relevant records and images related to the planned course of therapy were reviewed.  Then, the patient was positioned in a stable reproducible clinical set-up for radiation therapy using an aquaplast mask.  IV contrast was administered and CT images were obtained.  Skin markings were placed.  The CT images were loaded into the planning software where the target and avoidance structures were contoured.  The patient's previous PET scan was fused with the planning CT to aid in target delineation. The radiation prescription was entered and confirmed.   TREATMENT PLANNING NOTE:  Treatment planning then occurred. I have requested : Intensity Modulated Radiotherapy (IMRT) is medically necessary for this case for the following reason:  Dose homogeneity and treatment of a head and neck site.   I personally oversaw and approved the construction of 1 medically necessary complex treatment device.

## 2012-10-21 NOTE — Progress Notes (Signed)
Met patient and his sister prior to scheduled SIM.  Introduced myself and explained my role as his navigator.  Will navigate as L1 patient.  Young Berry, RN, BSN, [facility] Head & Neck Oncology Navigator [phone]

## 2012-10-23 MED ORDER — HEPARIN SOD (PORK) LOCK FLUSH 100 UNIT/ML IV SOLN
500.0000 [IU] | Freq: Once | INTRAVENOUS | Status: AC
  Administered 2012-10-23: 500 [IU] via INTRAVENOUS

## 2012-10-23 MED ORDER — SODIUM CHLORIDE 0.9 % IJ SOLN
10.0000 mL | Freq: Once | INTRAMUSCULAR | Status: AC
  Administered 2012-10-23: 10 mL via INTRAVENOUS

## 2012-10-23 NOTE — Progress Notes (Signed)
Late entry from 10/21/2012   0945 Patient alert and oriented to person, place, and time. No distress noted. Steady gait noted. Pleasant affect noted. Patient denies pain. Accessed right subclavian port a cath on the first attempt. Excellent blood return obtained. Secure access per protocol. Patient tolerated well.  Escorted patient to CT for simulation.   1030 Simulation complete. Flushed and heparinized port per protocol. Removed access needle. Needle intact upon removal. Applied an bandaid to old access site. Patient tolerated well. Discharged home with sister.

## 2012-10-24 ENCOUNTER — Encounter (HOSPITAL_COMMUNITY)
Admission: RE | Admit: 2012-10-24 | Discharge: 2012-10-24 | Disposition: A | Discharge status: Home / Self Care | Payer: Medicaid Other | Source: Ambulatory Visit | Attending: Radiation Oncology | Admitting: Radiation Oncology

## 2012-10-24 ENCOUNTER — Encounter (HOSPITAL_COMMUNITY): Payer: Self-pay

## 2012-10-24 ENCOUNTER — Telehealth: Payer: Self-pay | Admitting: *Deleted

## 2012-10-24 ENCOUNTER — Encounter: Payer: Self-pay | Admitting: Radiation Oncology

## 2012-10-24 DIAGNOSIS — C8330 Diffuse large B-cell lymphoma, unspecified site: Secondary | ICD-10-CM

## 2012-10-24 DIAGNOSIS — C8589 Other specified types of non-Hodgkin lymphoma, extranodal and solid organ sites: Secondary | ICD-10-CM | POA: Insufficient documentation

## 2012-10-24 LAB — GLUCOSE, CAPILLARY: Glucose-Capillary: 99 mg/dL (ref 70–99)

## 2012-10-24 MED ORDER — FLUDEOXYGLUCOSE F - 18 (FDG) INJECTION
19.3000 | Freq: Once | INTRAVENOUS | Status: AC | PRN
  Administered 2012-10-24: 19.3 via INTRAVENOUS

## 2012-10-24 NOTE — Telephone Encounter (Signed)
sw pt gv appt for 10/30/12 w/ labs@ 2:15pm and ov @ 2:45pm. Pt is aware...td

## 2012-10-28 ENCOUNTER — Telehealth: Payer: Self-pay | Admitting: Hematology and Oncology

## 2012-10-28 NOTE — Telephone Encounter (Signed)
, °

## 2012-10-30 ENCOUNTER — Other Ambulatory Visit: Payer: Medicaid Other | Admitting: Lab

## 2012-10-30 ENCOUNTER — Ambulatory Visit
Admission: RE | Admit: 2012-10-30 | Discharge: 2012-10-30 | Disposition: A | Discharge status: Home / Self Care | Payer: Medicaid Other | Source: Ambulatory Visit | Attending: Radiation Oncology | Admitting: Radiation Oncology

## 2012-10-30 ENCOUNTER — Ambulatory Visit: Payer: Medicaid Other | Admitting: Hematology and Oncology

## 2012-10-30 DIAGNOSIS — C8330 Diffuse large B-cell lymphoma, unspecified site: Secondary | ICD-10-CM

## 2012-10-30 NOTE — Progress Notes (Signed)
   Department of Radiation Oncology  Phone:  [phone] Fax:        [phone]  Intensity modulated radiation therapy treatment device note  Today the patient began his helical IMRT directed at the head and neck region. The patient will be treated with 10.1 sinogram segments. This constitutes 1 IMRT device.  -----------------------------------  Nicholas Lade, PhD, MD

## 2012-10-31 ENCOUNTER — Ambulatory Visit
Admission: RE | Admit: 2012-10-31 | Discharge: 2012-10-31 | Disposition: A | Discharge status: Home / Self Care | Payer: Medicaid Other | Source: Ambulatory Visit | Attending: Radiation Oncology | Admitting: Radiation Oncology

## 2012-11-03 ENCOUNTER — Ambulatory Visit
Admission: RE | Admit: 2012-11-03 | Discharge: 2012-11-03 | Disposition: A | Discharge status: Home / Self Care | Payer: Medicaid Other | Source: Ambulatory Visit | Attending: Radiation Oncology | Admitting: Radiation Oncology

## 2012-11-03 ENCOUNTER — Ambulatory Visit: Payer: Medicaid Other

## 2012-11-03 ENCOUNTER — Ambulatory Visit: Payer: Medicaid Other | Attending: Radiation Oncology

## 2012-11-04 ENCOUNTER — Ambulatory Visit: Admission: RE | Admit: 2012-11-04 | Payer: Medicaid Other | Source: Ambulatory Visit | Admitting: Radiation Oncology

## 2012-11-04 ENCOUNTER — Ambulatory Visit: Payer: Medicaid Other | Admitting: Radiation Oncology

## 2012-11-04 ENCOUNTER — Ambulatory Visit
Admission: RE | Admit: 2012-11-04 | Discharge: 2012-11-04 | Disposition: A | Discharge status: Home / Self Care | Payer: Medicaid Other | Source: Ambulatory Visit | Attending: Radiation Oncology | Admitting: Radiation Oncology

## 2012-11-04 VITALS — BP 100/67 | HR 86 | Temp 98.1°F | Wt 112.2 lb

## 2012-11-04 DIAGNOSIS — C8330 Diffuse large B-cell lymphoma, unspecified site: Secondary | ICD-10-CM

## 2012-11-04 MED ORDER — BIAFINE EX EMUL
CUTANEOUS | Status: DC | PRN
  Administered 2012-11-04: 15:00:00 via TOPICAL

## 2012-11-04 NOTE — Progress Notes (Signed)
[facility]    Radiation Oncology [address] [place]     Maryln Gottron, M.D. [place], [place] [address]               Billie Lade, M.D., Ph.D. Phone: [phone]      Molli Hazard A. Kathrynn Running, M.D. Fax: [phone]      Radene Gunning, M.D., Ph.D.         Lurline Hare, M.D.         Grayland Jack, M.D Weekly Treatment Management Note  Name: Nicholas King     MRN: [id number]        CSN: [id number] Date: 11/04/2012      DOB: 12-Nov-1970  CC: Nicholas [name], MD         [name]    Status: Outpatient  Diagnosis: The encounter diagnosis was Diffuse large B cell lymphoma.  Current Dose: 7.2 Gy  Current Fraction: 4  Planned Dose: 30.6 Gy  Narrative: Nicholas King was seen today for weekly treatment management. The chart was checked and MVCT  were reviewed. He is tolerating the treatments well at this time without any new complaints. He has poor taste related to his prior chemotherapy.  Penicillins; Amoxicillin; and Hydrocodone Current Outpatient Prescriptions  Medication Sig Dispense Refill  . lidocaine-prilocaine (EMLA) cream Apply 1 application topically daily as needed (for port access).      . ondansetron (ZOFRAN) 8 MG tablet Take 8 mg by mouth every 8 (eight) hours as needed for nausea.      . predniSONE (DELTASONE) 20 MG tablet Take 60 mg by mouth daily. Takes for 5 days after chemo       No current facility-administered medications for this encounter.   Labs:  Lab Results  Component Value Date   WBC 5.2 10/03/2012   HGB 13.9 10/03/2012   HCT 43.2 10/03/2012   MCV 93.5 10/03/2012   PLT 257 10/03/2012   Lab Results  Component Value Date   CREATININE 0.7 10/03/2012   BUN 12.8 10/03/2012   NA 140 10/03/2012   K 4.4 10/03/2012   CL 107 07/24/2012   CO2 24 10/03/2012   Lab Results  Component Value Date   ALT 12 10/03/2012   AST 15 10/03/2012   PHOS 3.0 05/16/2012   BILITOT 0.24 10/03/2012    Physical Examination:  weight is 112 lb 3.2 oz  (50.894 kg). His temperature is 98.1 F (36.7 C). His blood pressure is 100/67 and his pulse is 86. His oxygen saturation is 100%.    Wt Readings from Last 3 Encounters:  11/04/12 112 lb 3.2 oz (50.894 kg)  10/03/12 108 lb 3.2 oz (49.079 kg)  09/05/12 113 lb 12.8 oz (51.619 kg)    The oral cavity is somewhat dry without secondary infection. The skin of the treatment area shows mild erythema. Lungs - Normal respiratory effort, chest expands symmetrically. Lungs are clear to auscultation, no crackles or wheezes.  Heart has regular rhythm and rate  Abdomen is soft and non tender with normal bowel sounds  Assessment:  Patient tolerating treatments well  Plan: Continue treatment per original radiation prescription

## 2012-11-04 NOTE — Addendum Note (Signed)
Encounter addended by: Tessa Lerner, RN on: 11/04/2012  3:11 PM<BR>     Documentation filed: Orders

## 2012-11-04 NOTE — Addendum Note (Signed)
Encounter addended by: Tessa Lerner, RN on: 11/04/2012  3:14 PM<BR>     Documentation filed: Inpatient MAR

## 2012-11-04 NOTE — Progress Notes (Signed)
Weekly assessment of bilateral head/neck radiation.Completed 4 of 17 treatments.Routine  of clinic reviewed as well as side effects of treatment.Mild pain , no worse than prior to assessment on consultation day.Reviewed skin care and given biafine.Patient able to state back at least two side effects.

## 2012-11-05 ENCOUNTER — Other Ambulatory Visit: Payer: Self-pay | Admitting: Hematology and Oncology

## 2012-11-05 ENCOUNTER — Ambulatory Visit: Payer: Medicaid Other

## 2012-11-05 ENCOUNTER — Telehealth: Payer: Self-pay | Admitting: *Deleted

## 2012-11-05 NOTE — Telephone Encounter (Signed)
Called pt in f/u to missed appts.  He indicated his usual transportation couldn't make it today but that it shouldn't be a problem in the future.  I reviewed his appts for tomorrow, including appt with Dr. Bertis Ruddy.  Pt indicated he is experiencing thickened saliva; I encouraged him to statrt using salt/baking soda rinse.  Pt indicated understanding.  Will continue to navigate as L1 (new) patient.  Young Berry, RN, BSN, [facility] Head & Neck Oncology Navigator [phone]

## 2012-11-06 ENCOUNTER — Telehealth: Payer: Self-pay | Admitting: Hematology and Oncology

## 2012-11-06 ENCOUNTER — Encounter: Payer: Self-pay | Admitting: Hematology and Oncology

## 2012-11-06 ENCOUNTER — Ambulatory Visit (HOSPITAL_BASED_OUTPATIENT_CLINIC_OR_DEPARTMENT_OTHER): Payer: Medicaid Other | Admitting: Hematology and Oncology

## 2012-11-06 ENCOUNTER — Ambulatory Visit
Admission: RE | Admit: 2012-11-06 | Discharge: 2012-11-06 | Disposition: A | Discharge status: Home / Self Care | Payer: Medicaid Other | Source: Ambulatory Visit | Attending: Radiation Oncology | Admitting: Radiation Oncology

## 2012-11-06 ENCOUNTER — Telehealth: Payer: Self-pay | Admitting: *Deleted

## 2012-11-06 ENCOUNTER — Other Ambulatory Visit: Payer: Medicaid Other | Admitting: Lab

## 2012-11-06 VITALS — BP 124/84 | HR 82 | Temp 97.7°F | Resp 18 | Ht 67.0 in | Wt 106.7 lb

## 2012-11-06 DIAGNOSIS — C8330 Diffuse large B-cell lymphoma, unspecified site: Secondary | ICD-10-CM

## 2012-11-06 DIAGNOSIS — M792 Neuralgia and neuritis, unspecified: Secondary | ICD-10-CM

## 2012-11-06 DIAGNOSIS — C8589 Other specified types of non-Hodgkin lymphoma, extranodal and solid organ sites: Secondary | ICD-10-CM

## 2012-11-06 DIAGNOSIS — R5381 Other malaise: Secondary | ICD-10-CM

## 2012-11-06 DIAGNOSIS — R209 Unspecified disturbances of skin sensation: Secondary | ICD-10-CM

## 2012-11-06 HISTORY — DX: Neuralgia and neuritis, unspecified: M79.2

## 2012-11-06 MED ORDER — PREGABALIN 75 MG PO CAPS
75.0000 mg | ORAL_CAPSULE | Freq: Two times a day (BID) | ORAL | Status: DC
Start: 2012-11-06 — End: 2012-12-26

## 2012-11-06 MED ORDER — PREGABALIN 75 MG PO CAPS: 75.0000 mg | ORAL_CAPSULE | Freq: Two times a day (BID) | ORAL | Status: DC

## 2012-11-06 NOTE — Telephone Encounter (Signed)
Script for lyrica called in to patient's pharmacy. Lm on patient's phone to call this office.

## 2012-11-06 NOTE — Progress Notes (Signed)
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   SUMMARY OF ONCOLOGIC HISTORY: #08 March 2012 He developed left side sore throat; progressive; 2 months. Hoarse voice. Dysphagia with solid (better with liquid). 40 lb weight loss 3 weeks. #2 CT soft tissue the neck on 04/17/2012 showed extensive soft tissue swelling of the left tonsil without abscess. There were shotty lymph nodes in the neck bilaterally.  #3 Biopsy by Dr. Jearld Fenton on 05/08/2012 showed large B-cell lymphoma in the posterior pharynx, and in the right posterior pharyngeal wall.  #4 He was started on chemo R-CHOP on 06/09/2012 #5 10/03/2012: He completed 6 cycles of chemo along with 4 rounds of intrathecal chemotherapy #6 10/24/2012: Restaging PET scan showed complete response #7 10/30/12: Patient was started on consolidation RT  INTERVAL HISTORY: Nicholas King 42 y.o. male returns for further followup unit she is able to swallow food despite some mucositis. His major complaints consist of persistent numbness in both thighs with associated weakness. He denies any fevers or chills. His appetite is stable with no weight change. Denies any recent cough. He states the numbness in the thigh also cause some pain and discomfort. No new lymphadenopathy. Denies headaches or neurological deficit.  I have reviewed the past medical history, past surgical history, social history and family history with the patient and they are unchanged from previous note.  ALLERGIES:  is allergic to penicillins; amoxicillin; and hydrocodone.  MEDICATIONS: Current outpatient prescriptions:lidocaine-prilocaine (EMLA) cream, Apply 1 application topically daily as needed (for port access)., Disp: , Rfl: ;  ondansetron (ZOFRAN) 8 MG tablet, Take 8 mg by mouth every 8 (eight) hours as needed for nausea., Disp: , Rfl: ;  predniSONE  (DELTASONE) 20 MG tablet, Take 60 mg by mouth daily. Takes for 5 days after chemo, Disp: , Rfl:  pregabalin (LYRICA) 75 MG capsule, Take 1 capsule (75 mg total) by mouth 2 (two) times daily., Disp: 30 capsule, Rfl: 3;  pregabalin (LYRICA) 75 MG capsule, Take 1 capsule (75 mg total) by mouth 2 (two) times daily., Disp: 30 capsule, Rfl: 3  REVIEW OF SYSTEMS:   Constitutional: Denies fevers, chills or abnormal weight loss Eyes: Denies blurriness of vision Ears, nose, mouth, throat, and face: Denies mucositis or sore throat Respiratory: Denies cough, dyspnea or wheezes Cardiovascular: Denies palpitation, chest discomfort or lower extremity swelling Gastrointestinal:  Denies nausea, heartburn or change in bowel habits Skin: Denies abnormal skin rashes Lymphatics: Denies new lymphadenopathy or easy bruising Behavioral/Psych: Mood is stable, no new changes  All other systems were reviewed with the patient and are negative.  PHYSICAL EXAMINATION: ECOG PERFORMANCE STATUS: 1 - Symptomatic but completely ambulatory  Filed Vitals:   11/06/12 0909  BP: 124/84  Pulse: 82  Temp: 97.7 F (36.5 C)  Resp: 18   Filed Weights   11/06/12 0909  Weight: 106 lb 11.2 oz (48.399 kg)    GENERAL:alert, no distress and comfortable SKIN: skin color, texture, turgor are normal, significant lesions mild skin rash around his neck EYES: normal, Conjunctiva are pink and non-injected, sclera clear OROPHARYNX:no exudate, noted oral erythema  NECK: supple, thyroid normal size, non-tender, without nodularity LYMPH:  no palpable lymphadenopathy in the cervical, axillary or inguinal LUNGS: clear to auscultation and percussion with normal breathing effort HEART: regular rate & rhythm and no murmurs and no lower extremity edema ABDOMEN:abdomen soft, non-tender and normal bowel sounds  Musculoskeletal:no cyanosis of digits and no clubbing  NEURO: alert & oriented x 3 with fluent speech, no focal motor/sensory  deficits  LABORATORY DATA:  I have reviewed the data as listed    Component Value Date/Time   NA 140 10/03/2012 0806   NA 136 05/25/2012 0605   K 4.4 10/03/2012 0806   K 4.3 05/25/2012 0605   CL 107 07/24/2012 0819   CL 101 05/25/2012 0605   CO2 24 10/03/2012 0806   CO2 32 05/25/2012 0605   GLUCOSE 98 10/03/2012 0806   GLUCOSE 83 07/24/2012 0819   GLUCOSE 101* 05/25/2012 0605   BUN 12.8 10/03/2012 0806   BUN 5* 05/25/2012 0605   CREATININE 0.7 10/03/2012 0806   CREATININE 0.67 05/25/2012 0605   CALCIUM 9.0 10/03/2012 0806   CALCIUM 7.7* 05/25/2012 0605   PROT 6.8 10/03/2012 0806   PROT 5.4* 05/17/2012 0410   ALBUMIN 3.7 10/03/2012 0806   ALBUMIN 2.0* 05/17/2012 0410   AST 15 10/03/2012 0806   AST 19 05/17/2012 0410   ALT 12 10/03/2012 0806   ALT 13 05/17/2012 0410   ALKPHOS 50 10/03/2012 0806   ALKPHOS 44 05/17/2012 0410   BILITOT 0.24 10/03/2012 0806   BILITOT 0.4 05/17/2012 0410   GFRNONAA >90 05/25/2012 0605   GFRAA >90 05/25/2012 0605    No results found for this basename: SPEP, UPEP,  kappa and lambda light chains    Lab Results  Component Value Date   WBC 5.2 10/03/2012   NEUTROABS 3.4 10/03/2012   HGB 13.9 10/03/2012   HCT 43.2 10/03/2012   MCV 93.5 10/03/2012   PLT 257 10/03/2012      Chemistry      Component Value Date/Time   NA 140 10/03/2012 0806   NA 136 05/25/2012 0605   K 4.4 10/03/2012 0806   K 4.3 05/25/2012 0605   CL 107 07/24/2012 0819   CL 101 05/25/2012 0605   CO2 24 10/03/2012 0806   CO2 32 05/25/2012 0605   BUN 12.8 10/03/2012 0806   BUN 5* 05/25/2012 0605   CREATININE 0.7 10/03/2012 0806   CREATININE 0.67 05/25/2012 0605      Component Value Date/Time   CALCIUM 9.0 10/03/2012 0806   CALCIUM 7.7* 05/25/2012 0605   ALKPHOS 50 10/03/2012 0806   ALKPHOS 44 05/17/2012 0410   AST 15 10/03/2012 0806   AST 19 05/17/2012 0410   ALT 12 10/03/2012 0806   ALT 13 05/17/2012 0410   BILITOT 0.24 10/03/2012 0806   BILITOT 0.4 05/17/2012 0410       ASSESSMENT: Diffuse large B cell  lymphoma stage II  PLAN:  #1 diffuse large B cell lymphoma He is currently undergoing radiation therapy. His most recent PET/CT scan showed complete response. We will continue supportive care. #2 thigh numbness and weakness This is not clear the cause of this. Most chemotherapy does not cause this kind of neuropathy. The patient had intrathecal injection in his spine before. His last PET/CT scan showed no evidence of abnormalities in his spine. It could be due to some form of this problem causing impingement on the nerves. I recommend MRI of the thoracic spine and lumbar spine for further evaluation. In the meantime I recommend a trial of gabapentin for his neuropathic pain. However due to side effects experienced by some friends, is concerned about trying gabapentin. I recommend a trial of Lyrica. I did caution him about risk of nausea and constipation with lyrica. I will see him back within  the next 2 weeks to review tests results of the MRI.  All questions were answered. The patient knows to call the clinic with any problems, questions or concerns. We can certainly see the patient much sooner if necessary. No barriers to learning was detected.    [facility], Moani Weipert, MD 11/06/2012 9:16 PM

## 2012-11-06 NOTE — Telephone Encounter (Signed)
gv and printed appt sched and avs for pt for OCT. °

## 2012-11-07 ENCOUNTER — Ambulatory Visit
Admission: RE | Admit: 2012-11-07 | Discharge: 2012-11-07 | Disposition: A | Discharge status: Home / Self Care | Payer: Medicaid Other | Source: Ambulatory Visit | Attending: Radiation Oncology | Admitting: Radiation Oncology

## 2012-11-07 ENCOUNTER — Encounter: Payer: Self-pay | Admitting: *Deleted

## 2012-11-07 NOTE — Progress Notes (Signed)
Met with pt after scheduled RT.  Pt indicated he is experiencing thickened saliva and I re-educated him on use of baking soda/salt rinse.  He stated that he is going to have an MRI to evaluate lower back/leg pain.  Will continue to navigate as L2 (treatments established) patient.  Nicholas Berry, RN, BSN, [facility] Head & Neck Oncology Navigator [phone]

## 2012-11-10 ENCOUNTER — Ambulatory Visit
Admission: RE | Admit: 2012-11-10 | Discharge: 2012-11-10 | Disposition: A | Discharge status: Home / Self Care | Payer: Medicaid Other | Source: Ambulatory Visit | Attending: Radiation Oncology | Admitting: Radiation Oncology

## 2012-11-11 ENCOUNTER — Ambulatory Visit
Admission: RE | Admit: 2012-11-11 | Discharge: 2012-11-11 | Disposition: A | Discharge status: Home / Self Care | Payer: Medicaid Other | Source: Ambulatory Visit | Attending: Radiation Oncology | Admitting: Radiation Oncology

## 2012-11-11 VITALS — BP 115/79 | HR 65 | Temp 98.7°F | Wt 107.1 lb

## 2012-11-11 DIAGNOSIS — C8330 Diffuse large B-cell lymphoma, unspecified site: Secondary | ICD-10-CM

## 2012-11-11 MED ORDER — OXYCODONE-ACETAMINOPHEN 5-325 MG PO TABS: 1.0000 | ORAL_TABLET | ORAL | Status: DC | PRN

## 2012-11-11 NOTE — Progress Notes (Signed)
Patient here for routine weekly assessment of radiation to bilateral neck.Continued sore throat and difficulty swallowing.White patches visible on oral mucosa.Tongue is coated.Not currently prescribed pain med.Generalized fatigue.Completed 8 of 17 treatments.Skin is red.Continue application of biafine.

## 2012-11-11 NOTE — Progress Notes (Signed)
Weekly Management Note Current Dose:14.4 Gy  Projected Dose:30.6 Gy   Narrative:  The patient presents for routine under treatment assessment.  CBCT/MVCT images/Port film x-rays were reviewed.  The chart was checked. Not eating well due to sore throat. Also coughing up phlegm in morning. Using baking soda/salt rinses. Requests pain medication. Has taking percocet in the past with no reaction.   Physical Findings:  mucocitis over tongue. No thrush.   Vitals:  Filed Vitals:   11/11/12 1107  BP: 115/79  Pulse: 65  Temp: 98.7 F (37.1 C)   Weight:  Wt Readings from Last 3 Encounters:  11/11/12 107 lb 1.6 oz (48.58 kg)  11/06/12 106 lb 11.2 oz (48.399 kg)  11/04/12 112 lb 3.2 oz (50.894 kg)   Lab Results  Component Value Date   WBC 5.2 10/03/2012   HGB 13.9 10/03/2012   HCT 43.2 10/03/2012   MCV 93.5 10/03/2012   PLT 257 10/03/2012   Lab Results  Component Value Date   CREATININE 0.7 10/03/2012   BUN 12.8 10/03/2012   NA 140 10/03/2012   K 4.4 10/03/2012   CL 107 07/24/2012   CO2 24 10/03/2012     Impression:  The patient is tolerating radiation.  Plan:  Continue treatment as planned. Encouraged pos. Wrote for percocet 5/325 #30. Refer to dietician.

## 2012-11-12 ENCOUNTER — Ambulatory Visit
Admission: RE | Admit: 2012-11-12 | Discharge: 2012-11-12 | Disposition: A | Discharge status: Home / Self Care | Payer: Medicaid Other | Source: Ambulatory Visit | Attending: Radiation Oncology | Admitting: Radiation Oncology

## 2012-11-12 ENCOUNTER — Telehealth: Payer: Self-pay | Admitting: *Deleted

## 2012-11-12 ENCOUNTER — Ambulatory Visit: Payer: Medicaid Other | Attending: Radiation Oncology

## 2012-11-12 DIAGNOSIS — R1310 Dysphagia, unspecified: Secondary | ICD-10-CM | POA: Insufficient documentation

## 2012-11-12 DIAGNOSIS — IMO0001 Reserved for inherently not codable concepts without codable children: Secondary | ICD-10-CM | POA: Insufficient documentation

## 2012-11-12 NOTE — Telephone Encounter (Signed)
Called patient to inform of nutrition appt. For 11-17-12 with Zenovia Jarred, lvm for a return call

## 2012-11-13 ENCOUNTER — Ambulatory Visit
Admission: RE | Admit: 2012-11-13 | Discharge: 2012-11-13 | Disposition: A | Discharge status: Home / Self Care | Payer: Medicaid Other | Source: Ambulatory Visit | Attending: Radiation Oncology | Admitting: Radiation Oncology

## 2012-11-14 ENCOUNTER — Telehealth: Payer: Self-pay | Admitting: *Deleted

## 2012-11-14 ENCOUNTER — Ambulatory Visit: Payer: Medicaid Other

## 2012-11-14 ENCOUNTER — Ambulatory Visit: Payer: Medicaid Other | Admitting: Hematology and Oncology

## 2012-11-14 NOTE — Telephone Encounter (Signed)
LVM concern for today's missed appointments.  Requested call back.  Young Berry, RN, BSN, [facility] Head & Neck Oncology Navigator [phone]

## 2012-11-17 ENCOUNTER — Ambulatory Visit: Payer: Medicaid Other | Admitting: Nutrition

## 2012-11-17 ENCOUNTER — Encounter: Payer: Self-pay | Admitting: *Deleted

## 2012-11-17 ENCOUNTER — Ambulatory Visit
Admission: RE | Admit: 2012-11-17 | Discharge: 2012-11-17 | Disposition: A | Discharge status: Home / Self Care | Payer: Medicaid Other | Source: Ambulatory Visit | Attending: Radiation Oncology | Admitting: Radiation Oncology

## 2012-11-17 NOTE — Progress Notes (Signed)
Nutrition followup completed with patient.  Weight has increased slightly and was documented as 108 pounds October 13 increased from 107.1 pounds October 7.  Patient reports his throat is sore.  He does have difficulty swallowing.  He complains of no taste.  Patient denies nausea, vomiting, and constipation.  Patient does enjoy drinking ensure and boost.  He has not been drinking oral nutrition supplements consistently.  Nutrition diagnosis: Malnutrition continues.  Intervention: Patient was educated to add Ensure Plus 3 times a day between meals regardless of appetite.  Patient is to continue 3 meals daily consisting of soft, moist, high-calorie, high-protein foods.  He is to continue to monitor for nausea.  Patient was educated on recipes for using Ensure Plus to make a milk shake or smoothie.  He was provided with one complementary case of Ensure Plus along with recipes.  Teach back method used.  Contact information given.  Questions were answered.  Monitoring, evaluation, goals: Patient will tolerate Ensure Plus 3 times a day between meals.  He will continue to eat soft, moist foods at mealtimes.  He will achieve slow weight gain and improvement in nutritional status.  Next visit: Tuesday, October 21.  After radiation therapy.  Patient has my contact information if questions arise before next visit.

## 2012-11-17 NOTE — Progress Notes (Signed)
Met with pt during scheduled RT and nutritional f/u appt.  Will f/u during week to reinforce guidance provided by Vernell Leep for enhancing nutritional intake.  Will continue to navigate as L2 (treatments established) patient.  Young Berry, RN, BSN, [facility] Head & Neck Oncology Navigator [phone]

## 2012-11-18 ENCOUNTER — Encounter: Payer: Self-pay | Admitting: Radiation Oncology

## 2012-11-18 ENCOUNTER — Ambulatory Visit
Admission: RE | Admit: 2012-11-18 | Discharge: 2012-11-18 | Disposition: A | Discharge status: Home / Self Care | Payer: Medicaid Other | Source: Ambulatory Visit | Attending: Radiation Oncology | Admitting: Radiation Oncology

## 2012-11-18 ENCOUNTER — Ambulatory Visit (HOSPITAL_COMMUNITY): Payer: Medicaid Other

## 2012-11-18 ENCOUNTER — Ambulatory Visit (HOSPITAL_COMMUNITY)
Admission: RE | Admit: 2012-11-18 | Discharge: 2012-11-18 | Disposition: A | Discharge status: Home / Self Care | Payer: Medicaid Other | Source: Ambulatory Visit | Attending: Hematology and Oncology | Admitting: Hematology and Oncology

## 2012-11-18 VITALS — BP 135/98 | HR 79 | Resp 16 | Wt 109.0 lb

## 2012-11-18 DIAGNOSIS — M5124 Other intervertebral disc displacement, thoracic region: Secondary | ICD-10-CM | POA: Insufficient documentation

## 2012-11-18 DIAGNOSIS — C8589 Other specified types of non-Hodgkin lymphoma, extranodal and solid organ sites: Secondary | ICD-10-CM

## 2012-11-18 DIAGNOSIS — M5126 Other intervertebral disc displacement, lumbar region: Secondary | ICD-10-CM | POA: Insufficient documentation

## 2012-11-18 DIAGNOSIS — M899 Disorder of bone, unspecified: Secondary | ICD-10-CM | POA: Insufficient documentation

## 2012-11-18 DIAGNOSIS — M792 Neuralgia and neuritis, unspecified: Secondary | ICD-10-CM

## 2012-11-18 DIAGNOSIS — C8330 Diffuse large B-cell lymphoma, unspecified site: Secondary | ICD-10-CM

## 2012-11-18 MED ORDER — OXYCODONE-ACETAMINOPHEN 5-325 MG PO TABS: 1.0000 | ORAL_TABLET | ORAL | Status: DC | PRN

## 2012-11-18 MED ORDER — GADOBENATE DIMEGLUMINE 529 MG/ML IV SOLN
10.0000 mL | Freq: Once | INTRAVENOUS | Status: AC | PRN
  Administered 2012-11-18: 10 mL via INTRAVENOUS

## 2012-11-18 MED ORDER — OXYCODONE HCL ER 10 MG PO T12A: 10.0000 mg | EXTENDED_RELEASE_TABLET | Freq: Two times a day (BID) | ORAL | Status: DC

## 2012-11-18 NOTE — Addendum Note (Signed)
Encounter addended by: Lurline Hare, MD on: 11/18/2012  1:14 PM<BR>     Documentation filed: Notes Section

## 2012-11-18 NOTE — Progress Notes (Signed)
Patient has gained 2 pounds since last PUT. Patient seen by [facility]. Neff yesterday. Patient drinking three cans of ensure per day. Also, patient eats by mouth beans, taters, pudding and jello. Reports that he is no longer taking chemotherapy just radiation. Reports using biafine cream on anterior neck as directed. Mild hyperpigmentation of anterior neck noted. Denies cough or SOB. Reports dry mouth and taste changes. Reports using baking soda and peroxide rinse. States, "my throat is raw." Denies ringing in the ears.

## 2012-11-18 NOTE — Progress Notes (Addendum)
Weekly Management Note Current Dose: 39.6  Gy  Projected Dose: 45 Gy   Narrative:  The patient presents for routine under treatment assessment.  CBCT/MVCT images/Port film x-rays were reviewed.  The chart was checked. He is taking percocet 3-4 times per day. It decreases his pain but says it wears off too quickly.  He has last his taste but is trying to eat more after he saw the dietician yesterday. Mouth is improved. MRI yesterday ordered by Dr. Bertis Ruddy for back pain.   Physical Findings: Weight: 109 lb (49.442 kg). Unchanged. No skin changes. No mucocitis.  Impression:  The patient is tolerating radiation.  Plan:  Continue treatment as planned. Add oxycontin 10 mg bid and use percocet prn.

## 2012-11-19 ENCOUNTER — Ambulatory Visit
Admission: RE | Admit: 2012-11-19 | Discharge: 2012-11-19 | Disposition: A | Discharge status: Home / Self Care | Payer: Medicaid Other | Source: Ambulatory Visit | Attending: Radiation Oncology | Admitting: Radiation Oncology

## 2012-11-19 ENCOUNTER — Encounter: Payer: Self-pay | Admitting: Radiation Oncology

## 2012-11-19 MED ORDER — SODIUM CHLORIDE 0.9 % IJ SOLN
10.0000 mL | Freq: Once | INTRAMUSCULAR | Status: AC
  Administered 2012-11-19: 10 mL via INTRAVENOUS

## 2012-11-19 MED ORDER — HEPARIN SOD (PORK) LOCK FLUSH 100 UNIT/ML IV SOLN
500.0000 [IU] | Freq: Once | INTRAVENOUS | Status: AC
  Administered 2012-11-19: 500 [IU] via INTRAVENOUS

## 2012-11-19 NOTE — Addendum Note (Signed)
Encounter addended by: Tessa Lerner, RN on: 11/19/2012  9:24 AM<BR>     Documentation filed: Visit Diagnoses, Orders

## 2012-11-19 NOTE — Addendum Note (Signed)
Encounter addended by: Tessa Lerner, RN on: 11/19/2012  9:29 AM<BR>     Documentation filed: Inpatient MAR

## 2012-11-19 NOTE — Progress Notes (Signed)
11/19/12 2:56pm:  Lm for patient/mother to call me concerning insurance.  Patient needs to either go to [facility] PCP or call [facility] and have updated.

## 2012-11-20 ENCOUNTER — Ambulatory Visit
Admission: RE | Admit: 2012-11-20 | Discharge: 2012-11-20 | Disposition: A | Discharge status: Home / Self Care | Payer: Medicaid Other | Source: Ambulatory Visit | Attending: Radiation Oncology | Admitting: Radiation Oncology

## 2012-11-20 ENCOUNTER — Encounter: Payer: Self-pay | Admitting: Radiation Oncology

## 2012-11-20 NOTE — Progress Notes (Signed)
Patient is needing help with meds. He will bring me back proof of his disability of 721.00 per month. I advised him of [facility] fund 400.00.

## 2012-11-21 ENCOUNTER — Ambulatory Visit
Admission: RE | Admit: 2012-11-21 | Discharge: 2012-11-21 | Disposition: A | Discharge status: Home / Self Care | Payer: Medicaid Other | Source: Ambulatory Visit | Attending: Radiation Oncology | Admitting: Radiation Oncology

## 2012-11-24 ENCOUNTER — Ambulatory Visit
Admission: RE | Admit: 2012-11-24 | Discharge: 2012-11-24 | Disposition: A | Discharge status: Home / Self Care | Payer: Medicaid Other | Source: Ambulatory Visit | Attending: Radiation Oncology | Admitting: Radiation Oncology

## 2012-11-24 ENCOUNTER — Ambulatory Visit: Payer: Medicaid Other

## 2012-11-25 ENCOUNTER — Ambulatory Visit
Admission: RE | Admit: 2012-11-25 | Discharge: 2012-11-25 | Disposition: A | Discharge status: Home / Self Care | Payer: Medicaid Other | Source: Ambulatory Visit | Attending: Radiation Oncology | Admitting: Radiation Oncology

## 2012-11-25 ENCOUNTER — Encounter: Payer: Medicaid Other | Admitting: Nutrition

## 2012-11-25 ENCOUNTER — Ambulatory Visit: Payer: Medicaid Other

## 2012-11-25 ENCOUNTER — Encounter: Payer: Self-pay | Admitting: Nutrition

## 2012-11-25 ENCOUNTER — Encounter: Payer: Self-pay | Admitting: Radiation Oncology

## 2012-11-25 VITALS — BP 113/88 | HR 94 | Temp 98.1°F | Wt 102.4 lb

## 2012-11-25 DIAGNOSIS — C8330 Diffuse large B-cell lymphoma, unspecified site: Secondary | ICD-10-CM

## 2012-11-25 MED ORDER — OXYCODONE-ACETAMINOPHEN 5-325 MG PO TABS: 2.0000 | ORAL_TABLET | Freq: Four times a day (QID) | ORAL | Status: DC | PRN

## 2012-11-25 NOTE — Progress Notes (Signed)
  Radiation Oncology         [phone] ________________________________  Name: Nicholas King MRN: [id number]  Date: 11/25/2012  DOB: Dec 17, 1970  End of Treatment Note  Diagnosis:  Stage IIB DLBCL of the tonsil     Indication for treatment:  Curative       Radiation treatment dates:   9/25-10/21/14  Site/dose:   Bilateral neck and tonsils  Beams/energy:   Helical IMRT was utilized for critical organ sparing due to the extent of his disease. 6 MV photons delivered the dose and he received daily MV CT imaging.   Narrative: The patient tolerated radiation treatment relatively well.   He had a severe degree of mucositis which was controlled with narcotics. He lost about 10 pounds throughout treatment due to decreased taste and appetite.   Plan: The patient has completed radiation treatment. The patient will return to radiation oncology clinic for routine followup in one month. I advised them to call or return sooner if they have any questions or concerns related to their recovery or treatment.  ------------------------------------------------  Lurline Hare, MD

## 2012-11-25 NOTE — Progress Notes (Signed)
Weekly Management Note Current Dose: 30.6  Gy  Projected Dose: 30.6 Gy   Narrative:  The patient presents for routine under treatment assessment.  CBCT/MVCT images/Port film x-rays were reviewed.  The chart was checked. Was only able to get oxycontin on Friday. It is helping but percocet seems to help more. No taste. Still losing weight. Hair coming out in his beard. MRI ordered by med onc shows no reason for lateral bilateral leg numbness. ? Chemo vs. LPs.  Physical Findings: Weight: 102 lb 6.4 oz (46.448 kg). Neck skin is red. Mucocitis in posterior oropharynx.  Impression:  Finishes RT today. Expected side effects.  Plan:  Encouraged po intake. Discussed pros and cons of percocet and oxycontin. Discussed tylenol in percoet. Refilled percocet and he may take 2 every 6 hours. Follow up in 1 month.

## 2012-11-25 NOTE — Progress Notes (Signed)
Patient did not show up for nutrition appointment. 

## 2012-11-25 NOTE — Progress Notes (Signed)
Patient is here for routine weekly assessment and last treatment 17 of 17 treatments to bilateral neck (tonsillar) cancer.Pain on swallowing "6". Started oxycontin on last week with assist from [employer].I will fax pre-authorization form today and when and if approved will forward reqest to [facility] pharmacy.Patient to continue application of biafine to skin.Weight loss 7 lbs in last week and 10 lb over last 3 weeks.Encouraged to eat despite loss of taste buds and to drink boost/ensure.

## 2012-11-26 ENCOUNTER — Ambulatory Visit: Payer: Medicaid Other

## 2012-11-28 NOTE — Progress Notes (Signed)
Prior authorization form completed and faxed to [facility] pharmacy request for oxycontin 10 mg.at  Fax [phone].[facility] [phone].

## 2012-12-05 ENCOUNTER — Encounter: Payer: Self-pay | Admitting: Radiation Oncology

## 2012-12-10 ENCOUNTER — Ambulatory Visit: Payer: Medicaid Other | Attending: Radiation Oncology

## 2012-12-10 DIAGNOSIS — R1310 Dysphagia, unspecified: Secondary | ICD-10-CM | POA: Insufficient documentation

## 2012-12-10 DIAGNOSIS — IMO0001 Reserved for inherently not codable concepts without codable children: Secondary | ICD-10-CM | POA: Insufficient documentation

## 2012-12-19 ENCOUNTER — Telehealth: Payer: Self-pay

## 2012-12-19 ENCOUNTER — Encounter: Payer: Self-pay | Admitting: *Deleted

## 2012-12-19 ENCOUNTER — Ambulatory Visit: Payer: Medicaid Other

## 2012-12-19 ENCOUNTER — Telehealth: Payer: Self-pay | Admitting: Hematology and Oncology

## 2012-12-19 ENCOUNTER — Other Ambulatory Visit: Payer: Self-pay | Admitting: Radiation Oncology

## 2012-12-19 ENCOUNTER — Other Ambulatory Visit: Payer: Self-pay | Admitting: *Deleted

## 2012-12-19 DIAGNOSIS — C8330 Diffuse large B-cell lymphoma, unspecified site: Secondary | ICD-10-CM

## 2012-12-19 MED ORDER — OXYCODONE HCL ER 10 MG PO T12A
10.0000 mg | EXTENDED_RELEASE_TABLET | Freq: Two times a day (BID) | ORAL | Status: DC
Start: 2012-12-19 — End: 2012-12-26

## 2012-12-19 MED ORDER — OXYCODONE-ACETAMINOPHEN 5-325 MG PO TABS: 2.0000 | ORAL_TABLET | Freq: Four times a day (QID) | ORAL | Status: DC | PRN

## 2012-12-19 NOTE — Telephone Encounter (Signed)
lvm for pt regarding to NOV appt.. °

## 2012-12-19 NOTE — Progress Notes (Signed)
Mr. Schonberg in nursing to pick up a prescription for Oxycodone.  Weighted today and note an increase in weight to 105.7 lbs as compared to a weight of 102.4 lbs on 11/25/12.  He was very pleased.

## 2012-12-19 NOTE — Telephone Encounter (Signed)
Patient called answering service to inform that he is having pain and numbness of lower extremities,mr.i of L/S doesnot reveal cause of this.Patient also states that everytime he eats ,he breaks out in sweat.Continued sore throat.Patient states his pain has been so bad that he took all of his pain medication.Informed patient he has to take medication as prescribed as it makes it difficult to continue to prescribing narcotics.Spoke with Tammie Dr.[name] nurse and she will work on follow up appointment in med/onc.I will ask on call doctor for refill on pain medications.

## 2012-12-19 NOTE — Telephone Encounter (Signed)
Refill prescriptions  written for oxycontin and oxycodone (percocet).Patient informed to bring ID and pick up in radiation oncology nursing area.

## 2012-12-24 ENCOUNTER — Ambulatory Visit: Payer: Medicaid Other | Admitting: Hematology and Oncology

## 2012-12-25 ENCOUNTER — Telehealth: Payer: Self-pay | Admitting: Hematology and Oncology

## 2012-12-25 NOTE — Telephone Encounter (Signed)
pt lvmm missed 11/19 appt due to car troubles sw pt rs to 11/21 shh

## 2012-12-26 ENCOUNTER — Ambulatory Visit
Admission: RE | Admit: 2012-12-26 | Discharge: 2012-12-26 | Disposition: A | Discharge status: Home / Self Care | Payer: Medicaid Other | Source: Ambulatory Visit | Attending: Radiation Oncology | Admitting: Radiation Oncology

## 2012-12-26 ENCOUNTER — Encounter: Payer: Self-pay | Admitting: *Deleted

## 2012-12-26 ENCOUNTER — Ambulatory Visit (HOSPITAL_BASED_OUTPATIENT_CLINIC_OR_DEPARTMENT_OTHER): Payer: Medicaid Other

## 2012-12-26 ENCOUNTER — Other Ambulatory Visit (HOSPITAL_BASED_OUTPATIENT_CLINIC_OR_DEPARTMENT_OTHER): Payer: Medicaid Other | Admitting: Lab

## 2012-12-26 ENCOUNTER — Telehealth: Payer: Self-pay | Admitting: Hematology and Oncology

## 2012-12-26 ENCOUNTER — Encounter: Payer: Self-pay | Admitting: Hematology and Oncology

## 2012-12-26 ENCOUNTER — Ambulatory Visit (HOSPITAL_BASED_OUTPATIENT_CLINIC_OR_DEPARTMENT_OTHER): Payer: Medicaid Other | Admitting: Hematology and Oncology

## 2012-12-26 VITALS — BP 129/94 | HR 86 | Temp 98.6°F | Ht 67.0 in | Wt 104.8 lb

## 2012-12-26 VITALS — BP 123/73 | HR 101 | Temp 98.2°F | Resp 20 | Ht 67.0 in | Wt 105.0 lb

## 2012-12-26 DIAGNOSIS — G629 Polyneuropathy, unspecified: Secondary | ICD-10-CM

## 2012-12-26 DIAGNOSIS — C8589 Other specified types of non-Hodgkin lymphoma, extranodal and solid organ sites: Secondary | ICD-10-CM

## 2012-12-26 DIAGNOSIS — C8330 Diffuse large B-cell lymphoma, unspecified site: Secondary | ICD-10-CM

## 2012-12-26 DIAGNOSIS — C8590 Non-Hodgkin lymphoma, unspecified, unspecified site: Secondary | ICD-10-CM

## 2012-12-26 DIAGNOSIS — G5780 Other specified mononeuropathies of unspecified lower limb: Secondary | ICD-10-CM

## 2012-12-26 DIAGNOSIS — G609 Hereditary and idiopathic neuropathy, unspecified: Secondary | ICD-10-CM

## 2012-12-26 DIAGNOSIS — M549 Dorsalgia, unspecified: Secondary | ICD-10-CM

## 2012-12-26 DIAGNOSIS — M898X9 Other specified disorders of bone, unspecified site: Secondary | ICD-10-CM

## 2012-12-26 DIAGNOSIS — Z452 Encounter for adjustment and management of vascular access device: Secondary | ICD-10-CM

## 2012-12-26 DIAGNOSIS — K121 Other forms of stomatitis: Secondary | ICD-10-CM

## 2012-12-26 HISTORY — DX: Other specified disorders of bone, unspecified site: M89.8X9

## 2012-12-26 HISTORY — DX: Polyneuropathy, unspecified: G62.9

## 2012-12-26 LAB — COMPREHENSIVE METABOLIC PANEL (CC13)
ALT: 11 U/L (ref 0–55)
AST: 17 U/L (ref 5–34)
Alkaline Phosphatase: 68 U/L (ref 40–150)
Anion Gap: 10 mEq/L (ref 3–11)
CO2: 26 mEq/L (ref 22–29)
Creatinine: 0.8 mg/dL (ref 0.7–1.3)
Glucose: 84 mg/dl (ref 70–140)
Sodium: 141 mEq/L (ref 136–145)
Total Bilirubin: 0.73 mg/dL (ref 0.20–1.20)
Total Protein: 7.3 g/dL (ref 6.4–8.3)

## 2012-12-26 LAB — CBC WITH DIFFERENTIAL/PLATELET
BASO%: 0.5 % (ref 0.0–2.0)
EOS%: 0.5 % (ref 0.0–7.0)
Eosinophils Absolute: 0 10*3/uL (ref 0.0–0.5)
HCT: 42.4 % (ref 38.4–49.9)
HGB: 14.6 g/dL (ref 13.0–17.1)
LYMPH%: 10 % — ABNORMAL LOW (ref 14.0–49.0)
MCH: 30.6 pg (ref 27.2–33.4)
MCHC: 34.5 g/dL (ref 32.0–36.0)
MCV: 88.6 fL (ref 79.3–98.0)
MONO%: 9.4 % (ref 0.0–14.0)
NEUT#: 3.8 10*3/uL (ref 1.5–6.5)
NEUT%: 79.6 % — ABNORMAL HIGH (ref 39.0–75.0)
Platelets: 251 10*3/uL (ref 140–400)

## 2012-12-26 LAB — VITAMIN B12: Vitamin B-12: 565 pg/mL (ref 211–911)

## 2012-12-26 MED ORDER — HEPARIN SOD (PORK) LOCK FLUSH 100 UNIT/ML IV SOLN
500.0000 [IU] | Freq: Once | INTRAVENOUS | Status: AC
  Administered 2012-12-26: 500 [IU] via INTRAVENOUS
  Filled 2012-12-26: qty 5

## 2012-12-26 MED ORDER — SODIUM CHLORIDE 0.9 % IJ SOLN
10.0000 mL | INTRAMUSCULAR | Status: DC | PRN
  Administered 2012-12-26: 10 mL via INTRAVENOUS
  Filled 2012-12-26: qty 10

## 2012-12-26 NOTE — Progress Notes (Signed)
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   SUMMARY OF ONCOLOGIC HISTORY: #08 March 2012 He developed left side sore throat; progressive; 2 months. Hoarse voice. Dysphagia with solid (better with liquid). 40 lb weight loss 3 weeks. #2 CT soft tissue the neck on 04/17/2012 showed extensive soft tissue swelling of the left tonsil without abscess. There were shotty lymph nodes in the neck bilaterally.  #3 Biopsy by Dr. Jearld Fenton on 05/08/2012 showed large B-cell lymphoma in the posterior pharynx, and in the right posterior pharyngeal wall.  #4 He was started on chemo R-CHOP on 06/09/2012 #5 10/03/2012: He completed 6 cycles of chemo along with 4 rounds of intrathecal chemotherapy #6 10/24/2012: Restaging PET scan showed complete response #7 10/30/12: Patient was started on consolidation RT  INTERVAL HISTORY: Nicholas King 42 y.o. male returns for further followup. He still has persistent numbness in both times with associated weakness. He has persistent mucositis with difficulty swallowing food. He denies any recent fever, chills, night sweats or abnormal weight loss He complained of very mild peripheral neuropathy tips of fingers and toes. Denies any new lymphadenopathy.  I have reviewed the past medical history, past surgical history, social history and family history with the patient and they are unchanged from previous note.  ALLERGIES:  is allergic to penicillins; amoxicillin; and hydrocodone.  MEDICATIONS:  Current Outpatient Prescriptions  Medication Sig Dispense Refill  .  oxyCODONE-acetaminophen (PERCOCET/ROXICET) 5-325 MG per tablet Take 2 tablets by mouth every 6 (six) hours as needed for severe pain.  80 tablet  0  . lidocaine-prilocaine (EMLA) cream Apply 1 application topically daily as needed (for port access).       No current facility-administered medications for this visit.    REVIEW OF SYSTEMS:   Constitutional: Denies fevers, chills or abnormal weight loss Behavioral/Psych: Mood is stable, no new changes  All other systems were reviewed with the patient and are negative.  PHYSICAL EXAMINATION: ECOG PERFORMANCE STATUS: 1 - Symptomatic but completely ambulatory  Filed Vitals:   12/26/12 1353  BP: 123/73  Pulse: 101  Temp: 98.2 F (36.8 C)  Resp: 20   Filed Weights   12/26/12 1353  Weight: 105 lb (47.628 kg)    GENERAL:alert, no distress and comfortable. He looks thin and mildly cachectic Musculoskeletal:no cyanosis of digits and no clubbing  NEURO: alert & oriented x 3 with fluent speech,  LABORATORY DATA:  I have reviewed the data as listed    Component Value Date/Time   NA 140 10/03/2012 0806   NA 136 05/25/2012 0605   K 4.4 10/03/2012 0806   K 4.3 05/25/2012 0605   CL 107 07/24/2012 0819   CL 101 05/25/2012 0605   CO2 24 10/03/2012 0806   CO2 32 05/25/2012 0605   GLUCOSE 98 10/03/2012 0806   GLUCOSE 83 07/24/2012 0819   GLUCOSE 101* 05/25/2012 0605   BUN 12.8 10/03/2012 0806   BUN 5* 05/25/2012 0605   CREATININE 0.7 10/03/2012 0806   CREATININE 0.67 05/25/2012 0605   CALCIUM 9.0 10/03/2012 0806   CALCIUM 7.7* 05/25/2012 0605   PROT 6.8 10/03/2012 0806   PROT 5.4* 05/17/2012 0410  ALBUMIN 3.7 10/03/2012 0806   ALBUMIN 2.0* 05/17/2012 0410   AST 15 10/03/2012 0806   AST 19 05/17/2012 0410   ALT 12 10/03/2012 0806   ALT 13 05/17/2012 0410   ALKPHOS 50 10/03/2012 0806   ALKPHOS 44 05/17/2012 0410   BILITOT 0.24 10/03/2012 0806   BILITOT 0.4 05/17/2012 0410   GFRNONAA >90 05/25/2012 0605   GFRAA >90 05/25/2012 0605    No results found for  this basename: SPEP,  UPEP,   kappa and lambda light chains    Lab Results  Component Value Date   WBC 5.2 10/03/2012   NEUTROABS 3.4 10/03/2012   HGB 13.9 10/03/2012   HCT 43.2 10/03/2012   MCV 93.5 10/03/2012   PLT 257 10/03/2012      Chemistry      Component Value Date/Time   NA 140 10/03/2012 0806   NA 136 05/25/2012 0605   K 4.4 10/03/2012 0806   K 4.3 05/25/2012 0605   CL 107 07/24/2012 0819   CL 101 05/25/2012 0605   CO2 24 10/03/2012 0806   CO2 32 05/25/2012 0605   BUN 12.8 10/03/2012 0806   BUN 5* 05/25/2012 0605   CREATININE 0.7 10/03/2012 0806   CREATININE 0.67 05/25/2012 0605      Component Value Date/Time   CALCIUM 9.0 10/03/2012 0806   CALCIUM 7.7* 05/25/2012 0605   ALKPHOS 50 10/03/2012 0806   ALKPHOS 44 05/17/2012 0410   AST 15 10/03/2012 0806   AST 19 05/17/2012 0410   ALT 12 10/03/2012 0806   ALT 13 05/17/2012 0410   BILITOT 0.24 10/03/2012 0806   BILITOT 0.4 05/17/2012 0410     RADIOGRAPHIC STUDIES: I have personally reviewed the radiological images as listed and agreed with the findings in the report. I reviewed imaging studies with him we show no abnormalities to explain his symptoms   ASSESSMENT & PLAN:  #1 peripheral neuropathy  He described 2 types of neuropathy. The first type effects the tips of fingers and toes which is chemotherapy related. The second type of peripheral neuropathy was described as causing numbness in his thighs which could not be explained by the side effects of treatment. I would order B12 level today. Recommend neurology consultation and he agreed. This is not typical for lymphoma involvement. The patient had received intrathecal chemotherapy in the past. #2 lymphoma To clinically have no evidence of disease. I will order bloodwork and see him back in 4 months. #3 peripheral access We'll continue to keep his port for now. He'll get a port flush today. #4 back pain I will check a vitamin D level and if it runs low we'll go ahead and give  him prescription dose vitamin D replacement therapy. There are changes in his back MRI to suggest arthritis as a cause of his back pain. #5 mucositis This is resolving. We will manage this conservatively. Orders Placed This Encounter  Procedures  . Vitamin B12    Standing Status: Future     Number of Occurrences:      Standing Expiration Date: 12/26/2013  . Comprehensive metabolic panel    Standing Status: Future     Number of Occurrences:      Standing Expiration Date: 12/26/2013  . CBC with Differential    Standing Status: Future     Number of Occurrences:      Standing Expiration Date: 09/17/2013  . Vitamin D 25 hydroxy    Standing Status: Future     Number of Occurrences:  Standing Expiration Date: 12/26/2013  . Comprehensive metabolic panel    Standing Status: Future     Number of Occurrences:      Standing Expiration Date: 12/26/2013  . CBC with Differential    Standing Status: Future     Number of Occurrences:      Standing Expiration Date: 09/17/2013  . Lactate dehydrogenase    Standing Status: Future     Number of Occurrences:      Standing Expiration Date: 12/26/2013  . Ambulatory referral to Neurology    Referral Priority:  Routine    Referral Type:  Consultation    Referral Reason:  Specialty Services Required    Requested Specialty:  Neurology    Number of Visits Requested:  1   All questions were answered. The patient knows to call the clinic with any problems, questions or concerns. No barriers to learning was detected.    [facility], Jennea Rager, MD 12/26/2012 2:31 PM

## 2012-12-26 NOTE — Progress Notes (Signed)
Nicholas King here for follow up after treatment to his bilateral neck and tonsils.  He is having pain in his throat, lower back and both upper legs rated at a 7/10.  He is taking percocet 6 tablets per day.  Dr. Bertis Ruddy referred him to a neurologist today for the pain in his legs.  He reports that he is not able to taste foods.  He is able to eat whatever he wants.  He is making sure he eats even though he can't taste the food.  He reports a dry mouth.  Oral mucosa intact.  He continues to have ringing in his ears.  His skin is intact on his neck with hyperpigmentation.  He is using biafine.

## 2012-12-26 NOTE — Telephone Encounter (Signed)
LVMM for Nicholas King (NP COORD) at [facility] [phone] asking her to make a referral ASAP per Epic orders and advise same.  shh

## 2012-12-26 NOTE — Patient Instructions (Signed)

## 2012-12-26 NOTE — Progress Notes (Signed)
   Department of Radiation Oncology  Phone:  [phone] Fax:        [phone]   Name: Nicholas King MRN: [id number]  DOB: 1970/06/18  Date: 12/26/2012  Follow Up Visit Note  Diagnosis: Stage IIB DLBCL  Summary and Interval since last radiation: 1 month from   Interval History: Nicholas King presents today for routine followup.  The side effects unfortunately worsened after treatment was over. He had a difficult time with dysphasia and is still having difficulties with feelings that food is getting stuck. He continues to have pain in his back and as well as his legs which predates even the beginning of chemotherapy. He is referred to neurology. His weight is actually only down about 2 pounds. He is mostly taking narcotics for his back and leg pain. He has an appointment with speech pathology next week. He also has diminished taste. He really feels like he just can't taste anything even with a high amount of salt. He is accompanied by his girlfriend today. They saw Dr. Emeline Darling such earlier today.  Allergies:  Allergies  Allergen Reactions  . Penicillins Hives  . Amoxicillin Rash  . Hydrocodone Rash    Medications:  Current Outpatient Prescriptions  Medication Sig Dispense Refill  . emollient (BIAFINE) cream Apply topically 2 (two) times daily.      Marland Kitchen lidocaine-prilocaine (EMLA) cream Apply 1 application topically daily as needed (for port access).      Marland Kitchen oxyCODONE-acetaminophen (PERCOCET/ROXICET) 5-325 MG per tablet Take 2 tablets by mouth every 6 (six) hours as needed for severe pain.  80 tablet  0   No current facility-administered medications for this encounter.   Facility-Administered Medications Ordered in Other Encounters  Medication Dose Route Frequency Provider Last Rate Last Dose  . sodium chloride 0.9 % injection 10 mL  10 mL Intravenous PRN Artis Delay, MD   10 mL at 12/26/12 1556    Physical Exam:  Filed Vitals:   12/26/12 1456  BP: 129/94  Pulse: 86  Temp: 98.6  F (37 C)   he has hyperpigmentation and heart skin over his bilateral neck. His oral mucosa is moist. There is no mucositis or evidence of thrush  IMPRESSION: Nicholas King is a 42 y.o. male status post consolidative radiation with continuing effects of treatment  PLAN:  We talked about the use of lemon drops and zinc tablets for taste. We discussed the critical intake of protein impression boost and Ensure. He has been referred to neurology for his pain issues. I encouraged him to start using regular lotion with vitamin E on his neck. I also encouraged him to start increasing his physical activity as tolerated. He agreed to do so. I will see him back in a couple months just to see how things are progressing.    Lurline Hare, MD

## 2012-12-29 ENCOUNTER — Telehealth: Payer: Self-pay | Admitting: Hematology and Oncology

## 2012-12-29 NOTE — Progress Notes (Signed)
Met with patient during his scheduled appts with Drs. Bertis Ruddy and Michell Heinrich to provide support and care continuity.  Will continue to navigate as L3 (treatments completed) patient.  Young Berry, RN, BSN, [facility] Head & Neck Oncology Navigator [phone]

## 2012-12-29 NOTE — Telephone Encounter (Signed)
sw pt sch chemo ed class per 11/23 POF pt may have to cb and rs shh

## 2012-12-30 ENCOUNTER — Telehealth: Payer: Self-pay | Admitting: Hematology and Oncology

## 2012-12-30 NOTE — Telephone Encounter (Signed)
LVMM confirming appt w Neurology for 12/2 shh

## 2013-01-06 ENCOUNTER — Other Ambulatory Visit: Payer: Self-pay | Admitting: Radiation Oncology

## 2013-01-06 ENCOUNTER — Telehealth: Payer: Self-pay

## 2013-01-06 ENCOUNTER — Ambulatory Visit (INDEPENDENT_AMBULATORY_CARE_PROVIDER_SITE_OTHER): Payer: Medicaid Other | Admitting: Neurology

## 2013-01-06 ENCOUNTER — Encounter: Payer: Self-pay | Admitting: Neurology

## 2013-01-06 VITALS — BP 123/83 | HR 86 | Ht 67.5 in | Wt 109.0 lb

## 2013-01-06 DIAGNOSIS — G629 Polyneuropathy, unspecified: Secondary | ICD-10-CM

## 2013-01-06 DIAGNOSIS — G589 Mononeuropathy, unspecified: Secondary | ICD-10-CM

## 2013-01-06 DIAGNOSIS — M545 Low back pain, unspecified: Secondary | ICD-10-CM

## 2013-01-06 DIAGNOSIS — IMO0002 Reserved for concepts with insufficient information to code with codable children: Secondary | ICD-10-CM

## 2013-01-06 DIAGNOSIS — G5710 Meralgia paresthetica, unspecified lower limb: Secondary | ICD-10-CM | POA: Insufficient documentation

## 2013-01-06 DIAGNOSIS — M792 Neuralgia and neuritis, unspecified: Secondary | ICD-10-CM

## 2013-01-06 HISTORY — DX: Low back pain, unspecified: M54.50

## 2013-01-06 MED ORDER — GABAPENTIN 300 MG PO CAPS: 300.0000 mg | ORAL_CAPSULE | Freq: Two times a day (BID) | ORAL | Status: DC

## 2013-01-06 MED ORDER — OXYCODONE-ACETAMINOPHEN 5-325 MG PO TABS: 2.0000 | ORAL_TABLET | Freq: Four times a day (QID) | ORAL | Status: DC | PRN

## 2013-01-06 NOTE — Progress Notes (Signed)
Reason for visit: Leg pain  Nicholas King is a 42 y.o. male  History of present illness:  Nicholas King is a 42 year old left-handed white male with a history of a B-cell lymphoma that originated in the peritonsillar area. The patient has undergone a course of R-CHOP, and radiation therapy. The patient also received intrathecal chemotherapy. The initial diagnosis was made around January 2014. The patient indicates that prior to the diagnosis, the patient began having some pain in the mid thoracic and lower back, and discomfort into the anterolateral aspects of the thighs bilaterally. The patient denies any weakness of the legs, or problems with balance. The patient has had persistent symptoms that have not worsened or gotten better since its onset. The patient has undergone MRI evaluation of the thoracic spine and lumbosacral spine that does not show evidence of spinal cord or nerve root impingement. The patient also underwent a cervical spine CT scan that did not show spinal stenosis. The patient denies problems controlling the bowels or the bladder, and he denies problems with balance. The patient denies any pain coming out of the neck going down the arms. The patient is sent to this office for further evaluation.  Past Medical History  Diagnosis Date  . GERD (gastroesophageal reflux disease)   . Cancer   . Lymphoma   . Dysphagia, unspecified(787.20) 05/16/2012  . Pneumonia     April 2014  . 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 Neuropathic pain 11/06/2012  . Radiation 10/30/12-11/25/12    Bilateral Neck/Tonsil  . Neuropathy 12/26/2012  . Bone pain 12/26/2012  . Lumbago 01/06/2013    Past Surgical History  Procedure Laterality Date  . No past surgeries    . Direct laryngoscopy Left 05/08/2012    Procedure: DIRECT LARYNGOSCOPY with biopsy left tonsil;  Surgeon: Suzanna Obey, MD;  Location: [facility] OR;  Service: ENT;  Laterality: Left;  . Right eyebrown bone      Right frontal skull  fracture    Family History  Problem Relation Age of Onset  . Cancer Father     lung  . Diabetes Father   . Cancer Maternal Aunt     gyn cancer, pancreas  . Cancer Paternal Aunt     pancreas cancer  . Cancer Paternal Uncle     unk  . Cancer Sister     Social history:  reports that he has been smoking Cigarettes.  He has a 12.5 pack-year smoking history. He has never used smokeless tobacco. He reports that he drinks alcohol. He reports that he uses illicit drugs (Marijuana).  Medications:  Current Outpatient Prescriptions on File Prior to Visit  Medication Sig Dispense Refill  . emollient (BIAFINE) cream Apply topically 2 (two) times daily.      Marland Kitchen lidocaine-prilocaine (EMLA) cream Apply 1 application topically daily as needed (for port access).       No current facility-administered medications on file prior to visit.      Allergies  Allergen Reactions  . Penicillins Hives  . Amoxicillin Rash  . Hydrocodone Rash    ROS:  Out of a complete 14 system review of symptoms, the patient complains only of the following symptoms, and all other reviewed systems are negative.  Weight loss Ringing in the ears Itching sensations Feeling hot, flushing Joint pain, achy muscles Runny nose Numbness Change in appetite Restless legs  Blood pressure 123/83, pulse 86, height 5' 7.5" (1.715 m), weight 109 lb (49.442 kg).  Physical Exam  General:  The patient is alert and cooperative at the time of the examination.  Head: Pupils are equal, round, and reactive to light. Discs are flat bilaterally.  Neck: The neck is supple, no carotid bruits are noted.  Respiratory: The respiratory examination is clear.  Cardiovascular: The cardiovascular examination reveals a regular rate and rhythm, no obvious murmurs or rubs are noted.  Neuromuscular: The patient has good range of movement of the cervical and lumbosacral spines.  Skin: Extremities are without significant edema.  Neurologic  Exam  Mental status: The patient is alert, oriented x 3.  Cranial nerves: Facial symmetry is present. There is good sensation of the face to pinprick and soft touch bilaterally. The strength of the facial muscles and the muscles to head turning and shoulder shrug are normal bilaterally. Speech is well enunciated, no aphasia or dysarthria is noted. Extraocular movements are full. Visual fields are full.  Motor: The motor testing reveals 5 over 5 strength of all 4 extremities. The patient is able to walk on heels and the toes. Good symmetric motor tone is noted throughout.  Sensory: Sensory testing is intact to pinprick, soft touch, vibration sensation, and position sense on all 4 extremities, with exception of a stocking pattern pinprick sensory deficit across the ankles bilaterally. No evidence of extinction is noted.  Coordination: Cerebellar testing reveals good finger-nose-finger and heel-to-shin bilaterally.  Gait and station: Gait is normal. Tandem gait is normal. Romberg is negative. No drift is seen.  Reflexes: Deep tendon reflexes are symmetric, but are depressed in the legs bilaterally. Ankle jerk reflexes are absent bilaterally. Toes are downgoing bilaterally.  IMPRESSION:  MR THORACIC SPINE IMPRESSION  1. Small right-sided disc protrusions at T7-8 and T9-10. No cord  deformity or nerve root encroachment identified.  2. No findings concerning for lymphomatous involvement. There are 2  tiny indeterminate lesions within the T10 and T11 vertebral bodies,  likely hemangiomas.  MR LUMBAR SPINE IMPRESSION  1. Shallow right paracentral disc protrusion at L4-5. No spinal  stenosis or nerve root encroachment.  2. Broad-based left paracentral disc protrusion at L5-S1. No spinal  stenosis or nerve root encroachment.  3. No evidence of lymphomatous involvement in the lumbar spine.    Assessment/Plan:  1. Bilateral thigh discomfort, meralgia paresthetica  2. Chronic low back  pain  3. B-cell lymphoma  The patient is felt to be in remission with his B-cell lymphoma at this time. The patient presents with symptoms that occurred before chemotherapy, radiation therapy, or intrathecal chemotherapy. MRI evaluations of the lumbosacral and thoracic spine have not shown an etiology for his leg discomfort. The distribution of the sensory alteration is within the lateral femoral cutaneous nerve distribution, but the reason why this gentleman would have meralgia paresthetica is not clear. The patient is not overweight, and he does not have diabetes, which are the usual etiologies. The patient will be set up for nerve conduction studies of both legs, and one arm. EMG evaluation will be done on both legs. The patient will be placed on gabapentin taking 300 mg twice daily.  Nicholas Palau MD 01/06/2013 7:22 PM  [facility] [address] [place], [place] [address]  Phone [phone] Fax [phone]

## 2013-01-06 NOTE — Telephone Encounter (Signed)
Called for medication refill on percocet.

## 2013-01-07 ENCOUNTER — Telehealth: Payer: Self-pay

## 2013-01-07 NOTE — Telephone Encounter (Signed)
Called patient to inform that oral surgery will be post-poned until January 2015 pending improvement of nutritional status and healing of gums post completion of radiation.If he has not heard from Dr.Grand Beach's office by  1:00 pm tomorrow to call and reschedule appointment.Patient acknowledges understanding.

## 2013-01-13 ENCOUNTER — Telehealth: Payer: Self-pay | Admitting: Neurology

## 2013-01-13 ENCOUNTER — Encounter: Payer: Medicaid Other | Admitting: Neurology

## 2013-01-13 ENCOUNTER — Encounter: Payer: Medicaid Other | Admitting: Radiology

## 2013-01-13 NOTE — Telephone Encounter (Signed)
This patient did not show for the EMG appointment today. 

## 2013-01-14 ENCOUNTER — Ambulatory Visit: Payer: Medicaid Other

## 2013-01-20 ENCOUNTER — Other Ambulatory Visit: Payer: Self-pay | Admitting: Radiation Oncology

## 2013-01-20 MED ORDER — OXYCODONE-ACETAMINOPHEN 5-325 MG PO TABS: 2.0000 | ORAL_TABLET | Freq: Four times a day (QID) | ORAL | Status: DC | PRN

## 2013-01-20 NOTE — Progress Notes (Signed)
Patient picked up prescription for percocet.I also gave him information (address and phone number) to reschedule missed appointment for emg and nerve conduction study missed on 01/13/13.Patient states he knew he was suppose to be scheduled for appointment but never received call about appointments ordered by Dr.Vinson Willis.

## 2013-02-10 ENCOUNTER — Telehealth: Payer: Self-pay

## 2013-02-10 ENCOUNTER — Encounter: Payer: Self-pay | Admitting: Radiation Oncology

## 2013-02-10 ENCOUNTER — Other Ambulatory Visit: Payer: Self-pay | Admitting: Radiation Oncology

## 2013-02-10 MED ORDER — OXYCODONE-ACETAMINOPHEN 5-325 MG PO TABS: 2.0000 | ORAL_TABLET | Freq: Four times a day (QID) | ORAL | Status: DC | PRN

## 2013-02-10 NOTE — Telephone Encounter (Signed)
Patient called to pick up prescription for percocet and that this would be the last time our office prescribes per Dr.Wentworth.Informed we will send letter to Dr.Lago regarding recommendations for oral surgery and will wend him a copy as well.Patient thankful, and informed me to tell Dr.Wentworth thank you.He states he is going to go by what she says.Patient has appointment to see primary dentist March 15, 2013.Patient  is having severe tooth pain and states the tooth has broken in half.Knows to bring id to pick up prescription.

## 2013-02-11 NOTE — Progress Notes (Signed)
Letter faxed to [place] Bogard's office regarding when to have oral surgery.P:[phone] F:[phone].

## 2013-02-26 ENCOUNTER — Encounter: Payer: Self-pay | Admitting: *Deleted

## 2013-02-26 ENCOUNTER — Encounter: Payer: Self-pay | Admitting: Radiation Oncology

## 2013-02-26 ENCOUNTER — Ambulatory Visit
Admission: RE | Admit: 2013-02-26 | Discharge: 2013-02-26 | Disposition: A | Discharge status: Home / Self Care | Payer: Medicaid Other | Source: Ambulatory Visit | Attending: Radiation Oncology | Admitting: Radiation Oncology

## 2013-02-26 VITALS — BP 108/69 | HR 90 | Temp 98.2°F | Ht 67.5 in | Wt 105.9 lb

## 2013-02-26 DIAGNOSIS — C8330 Diffuse large B-cell lymphoma, unspecified site: Secondary | ICD-10-CM

## 2013-02-26 MED ORDER — OXYCODONE-ACETAMINOPHEN 10-325 MG PO TABS: 1.0000 | ORAL_TABLET | Freq: Three times a day (TID) | ORAL | Status: DC | PRN

## 2013-02-26 NOTE — Progress Notes (Addendum)
Spoke briefly with patient and his girlfriend after scheduled follow-up appt with Dr. Pablo Ledger.  Patient stated "doing well", did not express any needs or concerns.  Will continue to navigate as L3 (treatments completed) patient.  Gayleen Orem, RN, BSN, [facility] Head & Neck Oncology Navigator [phone]

## 2013-02-26 NOTE — Progress Notes (Signed)
   Department of Radiation Oncology  Phone:  [phone] Fax:        [phone]   Name: Nicholas King MRN: [id number]  DOB: May 04, 1970  Date: 02/26/2013  Follow Up Visit Note  Diagnosis: Stage IIB DLBCL  Summary and Interval since last radiation: 3 months from 30.6 Gy to the bilateral neck, nasopharynx and oropharynx (completed 11/25/12)  Interval History: Nicholas King presents today for routine followup.  He continues to have pain and paresthesias in his lower extremities. He missed an EMG with neurology and has a hard time getting in contact with the office. He still is using oxycodone about 10 mg 2-3 times a day to help with this pain. He is spending more time in bed. 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 A new symptom is feelings of pain down his cervical spine especially after he plays being with his mother. He does not describe these as typical Lhermitte's type shooting pains with flexion of his neck but rather constant pain after looking down for several hours.  His taste and swallowing is getting better. He can eat anything he wants. He still has some difficulty with bread but otherwise is doing well. He has no odynophagia. His girlfriend is a patient of Dr. Elisabeth Most and would like for him to be referred to [place] neurosurgery. He also needs a refill of his pain medications. His dentist is going away until February to do his teeth extractions. He needs to have his port flushed. His next appointment medical oncology is in March.   Allergies:  Allergies  Allergen Reactions  . Penicillins Hives  . Amoxicillin Rash  . Hydrocodone Rash    Medications:  Current Outpatient Prescriptions  Medication Sig Dispense Refill  . gabapentin (NEURONTIN) 300 MG capsule Take 1 capsule (300 mg total) by mouth 2 (two) times daily.  60 capsule  5  . lidocaine-prilocaine (EMLA) cream Apply 1 application topically daily as needed (for port access).      Marland Kitchen  oxyCODONE-acetaminophen (PERCOCET) 10-325 MG per tablet Take 1 tablet by mouth every 8 (eight) hours as needed for pain.  90 tablet  0   No current facility-administered medications for this encounter.    Physical Exam:  Filed Vitals:   02/26/13 1321  BP: 108/69  Pulse: 90  Temp: 98.2 F (36.8 C)  Height: 5' 7.5" (1.715 m)  Weight: 105 lb 14.4 oz (48.036 kg)   his beard is growing back. He has moist mucous membranes. His skin is healed up well. He is alert and oriented x3.  IMPRESSION: Nicholas King is a 43 y.o. male with resolving acute effects of treatment but continued pain and paresthesias in his lower extremities sent his intrathecal chemotherapy and what the possible development of Lhermitte's sign  PLAN:  I spoke to Nicholas King and his girlfriend today. I will try to get him in to see Dr. Joya Salm at their request. I gave him a refill of Percocet with #90 of 10/325. I will see him back in 2 months. We discussed that this pain in his neck would likely get better over time;he will certainly let me know if it getting worse. He is eating well and disease increase his caloric intake which we have discussed. He declined referral to speech therapy.    Thea Silversmith, MD

## 2013-02-26 NOTE — Progress Notes (Signed)
Mr. Bogacki here for assessment following radiation therapy to his bilateral neck and tonsils which completed on 11/25/12.  He states he thinks "he needs to have his throat stretched" because he has difficulty swallowing and continues on a soft diet.  He admits that he performs his swallowing exercises "sometimes".  He has lost 4 lbs since 01/06/14.  His mouth and throat are without any redness, mucosa moist and intact.

## 2013-02-27 ENCOUNTER — Other Ambulatory Visit: Payer: Self-pay | Admitting: *Deleted

## 2013-02-27 ENCOUNTER — Encounter (HOSPITAL_COMMUNITY): Payer: Medicaid Other | Attending: Hematology and Oncology

## 2013-02-27 DIAGNOSIS — Z95828 Presence of other vascular implants and grafts: Secondary | ICD-10-CM

## 2013-02-27 DIAGNOSIS — Z452 Encounter for adjustment and management of vascular access device: Secondary | ICD-10-CM

## 2013-02-27 DIAGNOSIS — C8589 Other specified types of non-Hodgkin lymphoma, extranodal and solid organ sites: Secondary | ICD-10-CM

## 2013-02-27 MED ORDER — HEPARIN SOD (PORK) LOCK FLUSH 100 UNIT/ML IV SOLN
INTRAVENOUS | Status: AC
  Filled 2013-02-27: qty 5

## 2013-02-27 MED ORDER — SODIUM CHLORIDE 0.9 % IJ SOLN
10.0000 mL | INTRAMUSCULAR | Status: DC | PRN
  Administered 2013-02-27: 10 mL via INTRAVENOUS

## 2013-02-27 MED ORDER — SODIUM CHLORIDE 0.9 % IJ SOLN
10.0000 mL | INTRAMUSCULAR | Status: DC | PRN
  Filled 2013-02-27: qty 10

## 2013-02-27 MED ORDER — HEPARIN SOD (PORK) LOCK FLUSH 100 UNIT/ML IV SOLN
500.0000 [IU] | Freq: Once | INTRAVENOUS | Status: DC
  Filled 2013-02-27: qty 5

## 2013-02-27 MED ORDER — HEPARIN SOD (PORK) LOCK FLUSH 100 UNIT/ML IV SOLN
500.0000 [IU] | Freq: Once | INTRAVENOUS | Status: AC
  Administered 2013-02-27: 500 [IU] via INTRAVENOUS

## 2013-02-27 NOTE — Progress Notes (Signed)
Nicholas King presented for Portacath access and flush. Portacath located right chest wall accessed with  H 20 needle. Good blood return present. Portacath flushed with 52ml NS and 500U/54ml Heparin and needle removed intact. Procedure without incident. Patient tolerated procedure well.

## 2013-03-02 ENCOUNTER — Telehealth: Payer: Self-pay | Admitting: *Deleted

## 2013-03-02 NOTE — Telephone Encounter (Signed)
Received phone call from Dr. Harley Hallmark Office and his scheduler told me that [facility] wanted this patient to see Dr. Maryjean Ka, I faxed notes to Dr. Donell Sievert Office Freda Munro), confirmation received where notes went thru.

## 2013-03-26 ENCOUNTER — Other Ambulatory Visit: Payer: Self-pay | Admitting: Radiation Oncology

## 2013-03-26 ENCOUNTER — Telehealth: Payer: Self-pay | Admitting: *Deleted

## 2013-03-26 MED ORDER — OXYCODONE-ACETAMINOPHEN 10-325 MG PO TABS: 1.0000 | ORAL_TABLET | Freq: Three times a day (TID) | ORAL | Status: DC | PRN

## 2013-03-26 NOTE — Telephone Encounter (Signed)
Called patient  Informed him his percocet rx is ready to be picked up at nursing station in [place], he said to thank MD and Val, he will come today to pick up  1:41 PM

## 2013-04-24 ENCOUNTER — Other Ambulatory Visit: Payer: Self-pay | Admitting: Radiation Oncology

## 2013-04-24 MED ORDER — OXYCODONE-ACETAMINOPHEN 10-325 MG PO TABS: 1.0000 | ORAL_TABLET | Freq: Three times a day (TID) | ORAL | Status: DC | PRN

## 2013-04-24 NOTE — Progress Notes (Signed)
Patient's girlfriend called to get pain medication refill.request sent to Dr.Wentworth in-basket, awaiting her return from [place] today.

## 2013-04-27 ENCOUNTER — Ambulatory Visit: Payer: Medicaid Other | Admitting: Hematology and Oncology

## 2013-04-27 ENCOUNTER — Other Ambulatory Visit: Payer: Medicaid Other

## 2013-04-30 ENCOUNTER — Ambulatory Visit
Admission: RE | Admit: 2013-04-30 | Discharge: 2013-04-30 | Disposition: A | Discharge status: Home / Self Care | Payer: Medicaid Other | Source: Ambulatory Visit | Attending: Radiation Oncology | Admitting: Radiation Oncology

## 2013-04-30 VITALS — BP 130/85 | HR 115 | Temp 98.5°F | Resp 20 | Wt 107.4 lb

## 2013-04-30 DIAGNOSIS — C8330 Diffuse large B-cell lymphoma, unspecified site: Secondary | ICD-10-CM

## 2013-04-30 MED ORDER — OXYCODONE-ACETAMINOPHEN 10-325 MG PO TABS: 1.0000 | ORAL_TABLET | Freq: Three times a day (TID) | ORAL | Status: DC | PRN

## 2013-04-30 NOTE — Progress Notes (Signed)
Patient here for routine follow up completion of radiation bilateral head/neck cancer.Pain all over but greatest is of posterior neck.States he was in [facility] and had mri of neck on 04/27/13 on mobile unit at  Neurologist.scheduled for  Nerve conduction on 05/12/13 wit Dr.botero.

## 2013-04-30 NOTE — Progress Notes (Signed)
Department of Radiation Oncology  Phone:  [phone] Fax:        [phone]   Name: Nicholas King MRN: [id number]  DOB: July 20, 1970  Date: 04/30/2013  Oncology: Nicholas King Neurology: Nicholas King Neurosurgery: Nicholas King Oral Surgery: Nicholas King  Follow Up Visit Note  Diagnosis: Stage IIB DLBCL  Summary and Interval since last radiation: 5 months from 30.6 Gy to the bilateral neck, nasopharynx and oropharynx (completed 11/25/12)  Interval History: Nicholas King presents today for routine followup.  He continues to have back pain which again is in the cervical spine and then down in his lower extremities. He has been seen by neurosurgery. A cervical spine MRI has been ordered. He also has nerve conduction studies scheduled for April 7. He is taking many oxycodone per day. He is not able to quantify these. He is out of the oxycodone I gave him on March 20. He is accompanied by his girlfriend. He reports he can eat as much as he wants. Food sometimes gets stuck and he has to regurgitate this but he has refused referral to speech therapy. His appetite and energy levels are good. He has difficulty lifting heavy objects but has gone back to work part-time. He had his teeth removed by Nicholas King and is having some pain in his bilateral mandible related to that. He has followup scheduled with him on Monday to discuss this.  Allergies:  Allergies  Allergen Reactions  . Penicillins Hives  . Amoxicillin Rash  . Hydrocodone Rash    Medications:  Current Outpatient Prescriptions  Medication Sig Dispense Refill  . gabapentin (NEURONTIN) 300 MG capsule Take 1 capsule (300 mg total) by mouth 2 (two) times daily.  60 capsule  5  . lidocaine-prilocaine (EMLA) cream Apply 1 application topically daily as needed (for port access).      Marland Kitchen oxyCODONE-acetaminophen (PERCOCET) 10-325 MG per tablet Take 1 tablet by mouth every 8 (eight) hours as needed for pain.  120 tablet  0   No current  facility-administered medications for this encounter.    Physical Exam:  Filed Vitals:   04/30/13 1603  BP: 130/85  Pulse: 115  Temp: 98.5 F (36.9 C)  Resp: 20  Weight: 107 lb 6.4 oz (48.716 kg)  SpO2: 99%   his beard is growing back. He has moist mucous membranes. His skin is healed up well. He is alert and oriented x3. He has painful exposed bone in his bilateral mandible. He has no palpable cervical or subclavicular adenopathy. Examination of his oropharynx and oral cavity shows no evidence of disease. An irregular examination shows his tonsils and base of tongue are clear.  IMPRESSION: Nicholas King is a 43 y.o. male with back pain  PLAN:  He will followup with neurosurgery. I spoke to Dr. Joya King. He is going to get him set up with Dr. Maryjean King for pain management. I gave Nicholas King 120 Percocet with the understanding this is the last pain medication he will receive from this office when he establishes care with Dr. Maryjean King. I will see him back in a month to be sure he has care established with Dr. Maryjean King.   Nicholas Silversmith, MD

## 2013-05-01 ENCOUNTER — Telehealth: Payer: Self-pay

## 2013-05-01 NOTE — Telephone Encounter (Signed)
Butch Penny  called to state [facility] would not refill script for percocet as it is too soon and requested an over ride approval.Patient will have to wait until it is time to get script again and aware to follow up with Dr.Harkin and Dr.Botero and that this is the last prescription to be given to patient from our office.Patient in the background speaking to girlfriend during this conversation.

## 2013-05-05 ENCOUNTER — Other Ambulatory Visit: Payer: Self-pay | Admitting: *Deleted

## 2013-05-07 ENCOUNTER — Telehealth: Payer: Self-pay | Admitting: Hematology and Oncology

## 2013-05-07 ENCOUNTER — Telehealth: Payer: Self-pay

## 2013-05-07 NOTE — Telephone Encounter (Signed)
Patient questioned when to have porta-cath flushed last done at [facility].Informed that he missed follow up and lab with Dr.gorsuch's office and for him to call and reschedule, at that time port may be flushed or schedule him to go to [facility].

## 2013-05-07 NOTE — Telephone Encounter (Signed)
lmonvm of Nicholas King from [facility] regarding this pt needing a flush appt as [facility] and the [facility] to be [facility]

## 2013-05-13 ENCOUNTER — Encounter (HOSPITAL_COMMUNITY): Payer: Medicaid Other

## 2013-05-13 ENCOUNTER — Encounter (HOSPITAL_COMMUNITY): Payer: Medicaid Other | Attending: Hematology and Oncology

## 2013-05-13 DIAGNOSIS — Z452 Encounter for adjustment and management of vascular access device: Secondary | ICD-10-CM

## 2013-05-13 DIAGNOSIS — C8589 Other specified types of non-Hodgkin lymphoma, extranodal and solid organ sites: Secondary | ICD-10-CM

## 2013-05-13 MED ORDER — HEPARIN SOD (PORK) LOCK FLUSH 100 UNIT/ML IV SOLN
500.0000 [IU] | Freq: Once | INTRAVENOUS | Status: AC
  Administered 2013-05-13: 500 [IU] via INTRAVENOUS
  Filled 2013-05-13: qty 5

## 2013-05-13 MED ORDER — SODIUM CHLORIDE 0.9 % IJ SOLN
10.0000 mL | INTRAMUSCULAR | Status: DC | PRN
  Administered 2013-05-13: 10 mL via INTRAVENOUS

## 2013-05-13 NOTE — Progress Notes (Signed)
Nicholas King presented for Portacath access and flush. Portacath located right chest wall accessed with  H 20 needle. Good blood return present. Portacath flushed with 23ml NS and 500U/33ml Heparin and needle removed intact. Procedure without incident. Patient tolerated procedure well.

## 2013-06-11 ENCOUNTER — Ambulatory Visit
Admission: RE | Admit: 2013-06-11 | Discharge: 2013-06-11 | Disposition: A | Discharge status: Home / Self Care | Payer: Medicaid Other | Source: Ambulatory Visit | Attending: Radiation Oncology | Admitting: Radiation Oncology

## 2013-06-11 NOTE — Progress Notes (Signed)
Patient called to cancel appointment and will call back to reschedule.. 

## 2013-06-24 ENCOUNTER — Encounter (HOSPITAL_COMMUNITY): Payer: Medicaid Other | Attending: Hematology and Oncology

## 2013-06-24 DIAGNOSIS — C8589 Other specified types of non-Hodgkin lymphoma, extranodal and solid organ sites: Secondary | ICD-10-CM

## 2013-06-24 DIAGNOSIS — Z452 Encounter for adjustment and management of vascular access device: Secondary | ICD-10-CM

## 2013-06-24 DIAGNOSIS — Z95828 Presence of other vascular implants and grafts: Secondary | ICD-10-CM

## 2013-06-24 DIAGNOSIS — Z9889 Other specified postprocedural states: Secondary | ICD-10-CM | POA: Insufficient documentation

## 2013-06-24 MED ORDER — HEPARIN SOD (PORK) LOCK FLUSH 100 UNIT/ML IV SOLN
500.0000 [IU] | Freq: Once | INTRAVENOUS | Status: AC
  Administered 2013-06-24: 500 [IU] via INTRAVENOUS

## 2013-06-24 MED ORDER — SODIUM CHLORIDE 0.9 % IJ SOLN
10.0000 mL | INTRAMUSCULAR | Status: DC | PRN
  Administered 2013-06-24: 10 mL via INTRAVENOUS

## 2013-06-24 MED ORDER — HEPARIN SOD (PORK) LOCK FLUSH 100 UNIT/ML IV SOLN
INTRAVENOUS | Status: AC
  Filled 2013-06-24: qty 5

## 2013-06-24 NOTE — Progress Notes (Signed)
Nicholas King presented for Portacath access and flush. Proper placement of portacath confirmed by CXR. Portacath located right chest wall accessed with  H 20 needle. Good blood return present. Portacath flushed with 31ml NS and 500U/37ml Heparin and needle removed intact. Procedure without incident. Patient tolerated procedure well.

## 2013-06-25 ENCOUNTER — Ambulatory Visit: Payer: Medicaid Other | Attending: Radiation Oncology | Admitting: Radiation Oncology

## 2013-07-03 ENCOUNTER — Telehealth: Payer: Self-pay | Admitting: Hematology and Oncology

## 2013-07-03 ENCOUNTER — Telehealth: Payer: Self-pay

## 2013-07-03 NOTE — Telephone Encounter (Signed)
Spoke with patient regarding appointments and informed that Dr.Wentworth  will see on an as needed basis.He is followed by Dr.Harkin and since he was a no show for Dr.gorsuch I forwarded him to medical oncology to reschedule follow up with Dr.[name].Patient stated he has carpel tunnel and some nerve damage.Cymbalta caused him to become angry and manic so he is now taking gabapentin.

## 2013-07-03 NOTE — Telephone Encounter (Signed)
pt calleld to r/s missed appt...pt aware of new d.t

## 2013-07-20 ENCOUNTER — Telehealth: Payer: Self-pay | Admitting: Hematology and Oncology

## 2013-07-20 ENCOUNTER — Ambulatory Visit (HOSPITAL_BASED_OUTPATIENT_CLINIC_OR_DEPARTMENT_OTHER): Payer: Medicaid Other | Admitting: Hematology and Oncology

## 2013-07-20 ENCOUNTER — Encounter: Payer: Self-pay | Admitting: Hematology and Oncology

## 2013-07-20 ENCOUNTER — Other Ambulatory Visit (HOSPITAL_BASED_OUTPATIENT_CLINIC_OR_DEPARTMENT_OTHER): Payer: Medicaid Other

## 2013-07-20 VITALS — BP 122/84 | HR 94 | Temp 98.2°F | Resp 18 | Ht 67.0 in | Wt 106.5 lb

## 2013-07-20 DIAGNOSIS — M898X9 Other specified disorders of bone, unspecified site: Secondary | ICD-10-CM

## 2013-07-20 DIAGNOSIS — C8330 Diffuse large B-cell lymphoma, unspecified site: Secondary | ICD-10-CM

## 2013-07-20 DIAGNOSIS — M949 Disorder of cartilage, unspecified: Secondary | ICD-10-CM

## 2013-07-20 DIAGNOSIS — C8589 Other specified types of non-Hodgkin lymphoma, extranodal and solid organ sites: Secondary | ICD-10-CM

## 2013-07-20 DIAGNOSIS — F17210 Nicotine dependence, cigarettes, uncomplicated: Secondary | ICD-10-CM

## 2013-07-20 DIAGNOSIS — F17200 Nicotine dependence, unspecified, uncomplicated: Secondary | ICD-10-CM

## 2013-07-20 DIAGNOSIS — M2550 Pain in unspecified joint: Secondary | ICD-10-CM

## 2013-07-20 DIAGNOSIS — M899 Disorder of bone, unspecified: Secondary | ICD-10-CM

## 2013-07-20 DIAGNOSIS — K089 Disorder of teeth and supporting structures, unspecified: Secondary | ICD-10-CM | POA: Insufficient documentation

## 2013-07-20 DIAGNOSIS — M792 Neuralgia and neuritis, unspecified: Secondary | ICD-10-CM

## 2013-07-20 DIAGNOSIS — R1310 Dysphagia, unspecified: Secondary | ICD-10-CM

## 2013-07-20 LAB — COMPREHENSIVE METABOLIC PANEL (CC13)
ALBUMIN: 3.9 g/dL (ref 3.5–5.0)
ALK PHOS: 76 U/L (ref 40–150)
ALT: 11 U/L (ref 0–55)
AST: 16 U/L (ref 5–34)
Anion Gap: 9 mEq/L (ref 3–11)
BUN: 9.7 mg/dL (ref 7.0–26.0)
CALCIUM: 8.9 mg/dL (ref 8.4–10.4)
CHLORIDE: 106 meq/L (ref 98–109)
CO2: 27 meq/L (ref 22–29)
Creatinine: 1.1 mg/dL (ref 0.7–1.3)
GLUCOSE: 102 mg/dL (ref 70–140)
POTASSIUM: 3.9 meq/L (ref 3.5–5.1)
SODIUM: 142 meq/L (ref 136–145)
TOTAL PROTEIN: 6.5 g/dL (ref 6.4–8.3)
Total Bilirubin: 0.41 mg/dL (ref 0.20–1.20)

## 2013-07-20 LAB — CBC WITH DIFFERENTIAL/PLATELET
BASO%: 0.5 % (ref 0.0–2.0)
BASOS ABS: 0 10*3/uL (ref 0.0–0.1)
EOS ABS: 0 10*3/uL (ref 0.0–0.5)
EOS%: 0.5 % (ref 0.0–7.0)
HEMATOCRIT: 41.7 % (ref 38.4–49.9)
HEMOGLOBIN: 13.7 g/dL (ref 13.0–17.1)
LYMPH#: 0.6 10*3/uL — AB (ref 0.9–3.3)
LYMPH%: 11.9 % — ABNORMAL LOW (ref 14.0–49.0)
MCH: 29.3 pg (ref 27.2–33.4)
MCHC: 32.9 g/dL (ref 32.0–36.0)
MCV: 89.1 fL (ref 79.3–98.0)
MONO#: 0.5 10*3/uL (ref 0.1–0.9)
MONO%: 9.5 % (ref 0.0–14.0)
NEUT%: 77.6 % — AB (ref 39.0–75.0)
NEUTROS ABS: 3.7 10*3/uL (ref 1.5–6.5)
Platelets: 178 10*3/uL (ref 140–400)
RBC: 4.68 10*6/uL (ref 4.20–5.82)
RDW: 14.5 % (ref 11.0–14.6)
WBC: 4.7 10*3/uL (ref 4.0–10.3)

## 2013-07-20 LAB — LACTATE DEHYDROGENASE (CC13): LDH: 171 U/L (ref 125–245)

## 2013-07-20 NOTE — Progress Notes (Signed)
[facility] OFFICE PROGRESS NOTE  Patient Care Team: Dayne Arne Cleveland III, MD as PCP - General (Radiology) Melissa Montane, MD as Attending Physician (Otolaryngology) Michel Bickers, MD (Infectious Diseases) Brooks Sailors, RN as Registered Nurse (Oncology) Heath Lark, MD as Consulting Physician (Hematology and Oncology)  SUMMARY OF ONCOLOGIC HISTORY: DIAGNOSIS: stage II DLBCL (mediastinal nodes on PET scan was most likely due to past lung abscess).   SUMMARY OF ONCOLOGIC HISTORY: #08 March 2012 He developed left side sore throat; progressive; 2 months. Hoarse voice. Dysphagia with solid (better with liquid). 40 lb weight loss 3 weeks. #2 CT soft tissue the neck on 04/17/2012 showed extensive soft tissue swelling of the left tonsil without abscess. There were shotty lymph nodes in the neck bilaterally.  #3 Biopsy by Dr. Janace Hoard on 05/08/2012 showed large B-cell lymphoma in the posterior pharynx, and in the right posterior pharyngeal wall.  #4 He was started on chemo R-CHOP on 06/09/2012 #5 10/03/2012: He completed 6 cycles of chemo along with 4 rounds of intrathecal chemotherapy #6 10/24/2012: Restaging PET scan showed complete response #7 10/30/12: Patient was started on consolidation RT  INTERVAL HISTORY: Please see below for problem oriented charting. He has chronic complaints of joint pain, neuropathic pain in dysphagia. He stated that liquid comes out from his nose.  REVIEW OF SYSTEMS:   Constitutional: Denies fevers, chills or abnormal weight loss Eyes: Denies blurriness of vision Ears, nose, mouth, throat, and face: Denies mucositis or sore throat Respiratory: Denies cough, dyspnea or wheezes Cardiovascular: Denies palpitation, chest discomfort or lower extremity swelling Gastrointestinal:  Denies nausea, heartburn or change in bowel habits Skin: Denies abnormal skin rashes Lymphatics: Denies new lymphadenopathy or easy bruising Neurological:Denies numbness,  tingling or new weaknesses Behavioral/Psych: Mood is stable, no new changes  All other systems were reviewed with the patient and are negative.  I have reviewed the past medical history, past surgical history, social history and family history with the patient and they are unchanged from previous note.  ALLERGIES:  is allergic to cymbalta; penicillins; amoxicillin; and hydrocodone.  MEDICATIONS:  Current Outpatient Prescriptions  Medication Sig Dispense Refill  . gabapentin (NEURONTIN) 100 MG capsule Take 100 mg by mouth 3 (three) times daily. Escalating dose up to 9 per day      . lidocaine-prilocaine (EMLA) cream Apply 1 application topically daily as needed (for port access).      Marland Kitchen oxyCODONE-acetaminophen (PERCOCET) 10-325 MG per tablet Take 1 tablet by mouth every 12 (twelve) hours as needed for pain.       No current facility-administered medications for this visit.    PHYSICAL EXAMINATION: ECOG PERFORMANCE STATUS: 1 - Symptomatic but completely ambulatory  Filed Vitals:   07/20/13 1436  BP: 122/84  Pulse: 94  Temp: 98.2 F (36.8 C)  Resp: 18   Filed Weights   07/20/13 1436  Weight: 106 lb 8 oz (48.308 kg)    GENERAL:alert, no distress and comfortable. He looks thin and cachectic SKIN: skin color, texture, turgor are normal, no rashes or significant lesions EYES: normal, Conjunctiva are pink and non-injected, sclera clear OROPHARYNX:no exudate, no erythema and lips, buccal mucosa, and tongue normal . Very poor dentition is noted NECK: supple, thyroid normal size, non-tender, without nodularity LYMPH:  no palpable lymphadenopathy in the cervical, axillary or inguinal LUNGS: clear to auscultation and percussion with normal breathing effort HEART: regular rate & rhythm and no murmurs and no lower extremity edema ABDOMEN:abdomen soft, non-tender and normal bowel sounds Musculoskeletal:no  cyanosis of digits and no clubbing  NEURO: alert & oriented x 3 with fluent speech,  no focal motor/sensory deficits  LABORATORY DATA:  I have reviewed the data as listed    Component Value Date/Time   NA 142 07/20/2013 1423   NA 136 05/25/2012 0605   K 3.9 07/20/2013 1423   K 4.3 05/25/2012 0605   CL 107 07/24/2012 0819   CL 101 05/25/2012 0605   CO2 27 07/20/2013 1423   CO2 32 05/25/2012 0605   GLUCOSE 102 07/20/2013 1423   GLUCOSE 83 07/24/2012 0819   GLUCOSE 101* 05/25/2012 0605   BUN 9.7 07/20/2013 1423   BUN 5* 05/25/2012 0605   CREATININE 1.1 07/20/2013 1423   CREATININE 0.67 05/25/2012 0605   CALCIUM 8.9 07/20/2013 1423   CALCIUM 7.7* 05/25/2012 0605   PROT 6.5 07/20/2013 1423   PROT 5.4* 05/17/2012 0410   ALBUMIN 3.9 07/20/2013 1423   ALBUMIN 2.0* 05/17/2012 0410   AST 16 07/20/2013 1423   AST 19 05/17/2012 0410   ALT 11 07/20/2013 1423   ALT 13 05/17/2012 0410   ALKPHOS 76 07/20/2013 1423   ALKPHOS 44 05/17/2012 0410   BILITOT 0.41 07/20/2013 1423   BILITOT 0.4 05/17/2012 0410   GFRNONAA >90 05/25/2012 0605   GFRAA >90 05/25/2012 0605    No results found for this basename: SPEP,  UPEP,   kappa and lambda light chains    Lab Results  Component Value Date   WBC 4.7 07/20/2013   NEUTROABS 3.7 07/20/2013   HGB 13.7 07/20/2013   HCT 41.7 07/20/2013   MCV 89.1 07/20/2013   PLT 178 07/20/2013      Chemistry      Component Value Date/Time   NA 142 07/20/2013 1423   NA 136 05/25/2012 0605   K 3.9 07/20/2013 1423   K 4.3 05/25/2012 0605   CL 107 07/24/2012 0819   CL 101 05/25/2012 0605   CO2 27 07/20/2013 1423   CO2 32 05/25/2012 0605   BUN 9.7 07/20/2013 1423   BUN 5* 05/25/2012 0605   CREATININE 1.1 07/20/2013 1423   CREATININE 0.67 05/25/2012 0605      Component Value Date/Time   CALCIUM 8.9 07/20/2013 1423   CALCIUM 7.7* 05/25/2012 0605   ALKPHOS 76 07/20/2013 1423   ALKPHOS 44 05/17/2012 0410   AST 16 07/20/2013 1423   AST 19 05/17/2012 0410   ALT 11 07/20/2013 1423   ALT 13 05/17/2012 0410   BILITOT 0.41 07/20/2013 1423   BILITOT 0.4 05/17/2012 0410     ASSESSMENT & PLAN:   Diffuse large B cell lymphoma Clinically, he has no signs of disease recurrence. I recommend history, physical examination and blood work in 6 months.  Cigarette smoker I spent some time counseling the patient the importance of tobacco cessation. 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 I recommend GI evaluation for possible esophageal dilatation. He declines referral to speech and language therapist  Neuropathic pain He is on gabapentin. I would not recommend review of pain prescriptions. I recommend he follows primary doctor for chronic pain management.  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 The patient is noncompliant with vitamin D supplementation. I have rechecked his serum vitamin D level today and we'll call the patient with results.  Poor dentition Dental extraction is scheduled later this month. I recommend removal of  Infuse-a-Port after procedure is completed.     Orders Placed This Encounter  Procedures  . Comprehensive metabolic panel    Standing Status: Future     Number of Occurrences:      Standing Expiration Date: 07/20/2014  . CBC with Differential    Standing Status: Future     Number of Occurrences:      Standing Expiration Date: 07/20/2014  . Lactate dehydrogenase    Standing Status: Future     Number of Occurrences:      Standing Expiration Date: 07/20/2014  . Ambulatory referral to Gastroenterology    Referral Priority:  Routine    Referral Type:  Consultation    Referral Reason:  Specialty Services Required    Requested Specialty:  Gastroenterology    Number of Visits Requested:  1   All questions were answered. The patient knows to call the clinic with any problems, questions or concerns. No barriers to learning was detected.     [facility], [place], MD 07/20/2013 8:06 PM

## 2013-07-20 NOTE — Assessment & Plan Note (Signed)
Dental extraction is scheduled later this month. I recommend removal of Infuse-a-Port after procedure is completed.

## 2013-07-20 NOTE — Assessment & Plan Note (Signed)
He is on gabapentin. I would not recommend review of pain prescriptions. I recommend he follows primary doctor for chronic pain management.

## 2013-07-20 NOTE — Assessment & Plan Note (Signed)
Clinically, he has no signs of disease recurrence. I recommend history, physical examination and blood work in 6 months.

## 2013-07-20 NOTE — Assessment & Plan Note (Signed)
I suspect this is due to chronic DJD and vitamin D deficiency. The patient is noncompliant with vitamin D supplementation. I have rechecked his serum vitamin D level today and we'll call the patient with results.

## 2013-07-20 NOTE — Assessment & Plan Note (Signed)
I spent some time counseling the patient the importance of tobacco cessation. he is currently attempting to quit on his own 

## 2013-07-20 NOTE — Assessment & Plan Note (Addendum)
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 I recommend GI evaluation for possible esophageal dilatation. He declines referral to speech and language therapist

## 2013-07-20 NOTE — Telephone Encounter (Signed)
gv adn printed appt sched and avs for pt for Dec....pt medicaid insurance they would not sched...he needs primary to refer

## 2013-07-21 ENCOUNTER — Telehealth: Payer: Self-pay | Admitting: *Deleted

## 2013-07-21 LAB — VITAMIN D 25 HYDROXY (VIT D DEFICIENCY, FRACTURES): Vit D, 25-Hydroxy: 28 ng/mL — ABNORMAL LOW (ref 30–89)

## 2013-07-21 NOTE — Telephone Encounter (Signed)
Message copied by Cathlean Cower on Tue Jul 21, 2013  9:00 AM ------      Message from: [facility], [place]      Created: Tue Jul 21, 2013  8:12 AM      Regarding: low vitamin D       He needs to take high dose vitamin D.      I recommend 5000 units daily for 1 month then 1000 units daily      ----- Message -----         From: [facility]         Sent: 07/20/2013   2:40 PM           To: Heath Lark, MD                   ------

## 2013-07-21 NOTE — Telephone Encounter (Signed)
Left VM for pt on cell and home numbers requesting he call nurse back.

## 2013-07-21 NOTE — Telephone Encounter (Signed)
Informed pt of low Vitamin D and to take Vitamin D 5000 units daily for one month then 1000 units daily after that.  He verbalized understanding.

## 2013-07-22 ENCOUNTER — Telehealth: Payer: Self-pay | Admitting: *Deleted

## 2013-07-22 NOTE — Telephone Encounter (Signed)
Pt states Vitamin D not available in 5,000 units OTC.  He asks for Rx sent to pharmacy for this dose.  [facility] to ask if they can get Vitamin D 5,000 units and they have it in stock OTC.  Called pt and notified him of Vit D 5,000 u available at his listed pharmacy, [place].  He verbalized understanding.

## 2013-08-05 ENCOUNTER — Encounter (HOSPITAL_COMMUNITY): Payer: Medicaid Other | Attending: Hematology and Oncology

## 2013-08-05 ENCOUNTER — Encounter (HOSPITAL_COMMUNITY): Payer: Medicaid Other

## 2013-08-05 DIAGNOSIS — Z452 Encounter for adjustment and management of vascular access device: Secondary | ICD-10-CM

## 2013-08-05 DIAGNOSIS — C8589 Other specified types of non-Hodgkin lymphoma, extranodal and solid organ sites: Secondary | ICD-10-CM

## 2013-08-05 DIAGNOSIS — Z9889 Other specified postprocedural states: Secondary | ICD-10-CM | POA: Insufficient documentation

## 2013-08-05 DIAGNOSIS — Z95828 Presence of other vascular implants and grafts: Secondary | ICD-10-CM

## 2013-08-05 MED ORDER — HEPARIN SOD (PORK) LOCK FLUSH 100 UNIT/ML IV SOLN
INTRAVENOUS | Status: AC
  Filled 2013-08-05: qty 5

## 2013-08-05 MED ORDER — HEPARIN SOD (PORK) LOCK FLUSH 100 UNIT/ML IV SOLN
500.0000 [IU] | Freq: Once | INTRAVENOUS | Status: AC
  Administered 2013-08-05: 500 [IU] via INTRAVENOUS

## 2013-08-05 MED ORDER — SODIUM CHLORIDE 0.9 % IJ SOLN
10.0000 mL | INTRAMUSCULAR | Status: DC | PRN
  Administered 2013-08-05: 10 mL via INTRAVENOUS

## 2013-08-05 NOTE — Progress Notes (Signed)
Nicholas King presented for Portacath access and flush.    Portacath located right chest wall accessed with  H 20 needle.  Good blood return present. Portacath flushed with 103ml NS and 500U/23ml Heparin and needle removed intact.  Procedure tolerated well and without incident.

## 2013-09-16 ENCOUNTER — Encounter (HOSPITAL_COMMUNITY): Payer: Medicaid Other

## 2013-09-25 ENCOUNTER — Telehealth: Payer: Self-pay | Admitting: Radiation Oncology

## 2013-09-25 NOTE — Telephone Encounter (Signed)
Mr. Wassink called wanting to know how our internal dentistry billed their claims. I referred Mr. Peter to Dr. Ritta Slot [place] at [phone].

## 2013-09-30 ENCOUNTER — Other Ambulatory Visit: Payer: Self-pay | Admitting: Hematology and Oncology

## 2013-09-30 ENCOUNTER — Telehealth: Payer: Self-pay | Admitting: *Deleted

## 2013-09-30 DIAGNOSIS — C8330 Diffuse large B-cell lymphoma, unspecified site: Secondary | ICD-10-CM

## 2013-09-30 NOTE — Telephone Encounter (Signed)
Order placed

## 2013-09-30 NOTE — Telephone Encounter (Signed)
Pt requests order to have his Port a Cath removed.

## 2013-10-01 NOTE — Telephone Encounter (Signed)
Informed pt of order placed to have PAC removed and to expect a call from Interventional Radiology to schedule the removal.  He verbalized understanding.

## 2013-10-06 ENCOUNTER — Other Ambulatory Visit: Payer: Self-pay | Admitting: Radiology

## 2013-10-07 ENCOUNTER — Encounter (HOSPITAL_COMMUNITY): Payer: Self-pay | Admitting: Pharmacy Technician

## 2013-10-07 ENCOUNTER — Other Ambulatory Visit: Payer: Self-pay | Admitting: Radiology

## 2013-10-09 ENCOUNTER — Ambulatory Visit (HOSPITAL_COMMUNITY): Admission: RE | Admit: 2013-10-09 | Payer: Medicaid Other | Source: Ambulatory Visit

## 2013-10-13 ENCOUNTER — Other Ambulatory Visit: Payer: Self-pay | Admitting: Radiology

## 2013-10-14 ENCOUNTER — Ambulatory Visit (HOSPITAL_COMMUNITY)
Admission: RE | Admit: 2013-10-14 | Discharge: 2013-10-14 | Disposition: A | Discharge status: Home / Self Care | Payer: Medicaid Other | Source: Ambulatory Visit | Attending: Hematology and Oncology | Admitting: Hematology and Oncology

## 2013-10-14 ENCOUNTER — Ambulatory Visit (HOSPITAL_COMMUNITY)
Admission: RE | Admit: 2013-10-14 | Discharge: 2013-10-14 | Discharge status: Against Medical Advice | Payer: Medicaid Other | Source: Ambulatory Visit | Attending: Hematology and Oncology | Admitting: Hematology and Oncology

## 2013-10-14 ENCOUNTER — Encounter (HOSPITAL_COMMUNITY): Payer: Self-pay

## 2013-10-14 DIAGNOSIS — Z452 Encounter for adjustment and management of vascular access device: Secondary | ICD-10-CM | POA: Diagnosis present

## 2013-10-14 DIAGNOSIS — Z923 Personal history of irradiation: Secondary | ICD-10-CM | POA: Diagnosis not present

## 2013-10-14 DIAGNOSIS — F17200 Nicotine dependence, unspecified, uncomplicated: Secondary | ICD-10-CM | POA: Insufficient documentation

## 2013-10-14 DIAGNOSIS — C8589 Other specified types of non-Hodgkin lymphoma, extranodal and solid organ sites: Secondary | ICD-10-CM | POA: Diagnosis not present

## 2013-10-14 DIAGNOSIS — Z79899 Other long term (current) drug therapy: Secondary | ICD-10-CM | POA: Diagnosis not present

## 2013-10-14 DIAGNOSIS — Z5320 Procedure and treatment not carried out because of patient's decision for unspecified reasons: Secondary | ICD-10-CM | POA: Insufficient documentation

## 2013-10-14 DIAGNOSIS — C8330 Diffuse large B-cell lymphoma, unspecified site: Secondary | ICD-10-CM

## 2013-10-14 LAB — PROTIME-INR
INR: 1.07 (ref 0.00–1.49)
PROTHROMBIN TIME: 13.9 s (ref 11.6–15.2)

## 2013-10-14 LAB — CBC WITH DIFFERENTIAL/PLATELET
Basophils Absolute: 0 10*3/uL (ref 0.0–0.1)
Basophils Relative: 0 % (ref 0–1)
Eosinophils Absolute: 0 10*3/uL (ref 0.0–0.7)
Eosinophils Relative: 1 % (ref 0–5)
HCT: 40.5 % (ref 39.0–52.0)
Hemoglobin: 13.5 g/dL (ref 13.0–17.0)
LYMPHS ABS: 0.6 10*3/uL — AB (ref 0.7–4.0)
LYMPHS PCT: 17 % (ref 12–46)
MCH: 30 pg (ref 26.0–34.0)
MCHC: 33.3 g/dL (ref 30.0–36.0)
MCV: 90 fL (ref 78.0–100.0)
Monocytes Absolute: 0.3 10*3/uL (ref 0.1–1.0)
Monocytes Relative: 10 % (ref 3–12)
NEUTROS PCT: 72 % (ref 43–77)
Neutro Abs: 2.5 10*3/uL (ref 1.7–7.7)
PLATELETS: 178 10*3/uL (ref 150–400)
RBC: 4.5 MIL/uL (ref 4.22–5.81)
RDW: 13.6 % (ref 11.5–15.5)
WBC: 3.4 10*3/uL — AB (ref 4.0–10.5)

## 2013-10-14 LAB — APTT: APTT: 30 s (ref 24–37)

## 2013-10-14 MED ORDER — VANCOMYCIN HCL IN DEXTROSE 1-5 GM/200ML-% IV SOLN: 1000.0000 mg | INTRAVENOUS | Status: DC

## 2013-10-14 MED ORDER — SODIUM CHLORIDE 0.9 % IV SOLN
INTRAVENOUS | Status: DC
  Administered 2013-10-14: 13:00:00 via INTRAVENOUS

## 2013-10-14 NOTE — Progress Notes (Signed)
Pt was informed it would be 30 minutes until he will be taken over for his procedure to begin.  Pt states he will not wait another 30 minutes and is asking for his IV to be taken out as he is requesting to leave. Pt's behavior became rude and slightly belligerent to staff as well as his wife treated the staff the same way.   Dr. Kathlene Cote notified.  Pt signed AMA papers and rescheduled his procedure.  Pt was informed there could always be a delay if an emergency or procedures took longer than expected.  Pt left in a hurry with unsteady gait and mumbled speech which was his baseline on arrival.

## 2013-10-14 NOTE — H&P (Signed)
Chief Complaint: "I am here to get my port removed."  Referring Physician(s): [name],Ni  History of Present Illness: Nicholas King is a 43 y.o. male with history of large B-Cell lymphoma. He had his right sided port placed 05/2012 without any complications. He no longer needs treatment and is scheduled today for port-a-catheter removal. He denies any chest pain, shortness of breath or palpitations. He denies any active signs of bleeding or excessive bruising. He denies any recent fever or chills. The patient denies any history of sleep apnea or chronic oxygen use. He has previously tolerated sedation without complications.   Past Medical History  Diagnosis Date  . GERD (gastroesophageal reflux disease)   . Cancer   . Lymphoma   . Dysphagia, unspecified(787.20) 05/16/2012  . Pneumonia     April 2014  . 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 Neuropathic pain 11/06/2012  . Radiation 10/30/12-11/25/12    Bilateral Neck/Tonsil  . Neuropathy 12/26/2012  . Bone pain 12/26/2012  . Lumbago 01/06/2013  . Radiation 10/30/12-11/25/12    Bilateral neck and tonsils    Past Surgical History  Procedure Laterality Date  . No past surgeries    . Direct laryngoscopy Left 05/08/2012    Procedure: DIRECT LARYNGOSCOPY with biopsy left tonsil;  Surgeon: Melissa Montane, MD;  Location: [place] [facility];  Service: ENT;  Laterality: Left;  . Right eyebrown bone      Right frontal skull fracture    Allergies: Cymbalta; Penicillins; Amoxicillin; and Hydrocodone  Medications: Prior to Admission medications   Medication Sig Start Date End Date Taking? Authorizing Provider  acetaminophen (TYLENOL) 500 MG tablet Take 1,000 mg by mouth every 6 (six) hours as needed for mild pain or moderate pain.    Historical Provider, MD  Cholecalciferol (VITAMIN D-3) 5000 UNITS TABS Take 1 tablet by mouth daily.    Historical Provider, MD  gabapentin (NEURONTIN) 300 MG capsule Take 300 mg by mouth 4 (four) times daily.    Historical  Provider, MD  ibuprofen (ADVIL,MOTRIN) 200 MG tablet Take 400 mg by mouth every 6 (six) hours as needed for mild pain or moderate pain.    Historical Provider, MD  oxyCODONE-acetaminophen (PERCOCET) 10-325 MG per tablet Take 1 tablet by mouth every 12 (twelve) hours as needed for pain.    Historical Provider, MD    Family History  Problem Relation Age of Onset  . Cancer Father     lung  . Diabetes Father   . Cancer Maternal Aunt     gyn cancer, pancreas  . Cancer Paternal Aunt     pancreas cancer  . Cancer Paternal Uncle     unk  . Cancer Sister     History   Social History  . Marital Status: Single    Spouse Name: N/A    Number of Children: 0  . Years of Education: 9th grade   Occupational History  .      [place]; maintenance   Social History Main Topics  . Smoking status: Current Every Day Smoker -- 0.50 packs/day for 25 years    Types: Cigarettes  . Smokeless tobacco: Never Used  . Alcohol Use: Yes     Comment: seldom  . Drug Use: Yes    Special: Marijuana     Comment: 3 week ago  . Sexual Activity: No   Other Topics Concern  . None   Social History Narrative  . None   Review of Systems: A 12 point ROS discussed and  pertinent positives are indicated in the HPI above.  All other systems are negative.  Review of Systems   Constitutional: Negative for fever and chills.  Respiratory: Negative for shortness of breath.   Cardiovascular: Negative for chest pain.  Gastrointestinal: Negative for blood in stool.  Skin: Negative for rash.   Vital Signs: T: 98.1 F, HR: 62 bpm, BP: 128/92 mmHg, O2: 100% RA  Physical Exam  Constitutional: He is oriented to person, place, and time. No distress.  HENT:  Head: Normocephalic and atraumatic.  Neck: Normal range of motion. No tracheal deviation present.  Cardiovascular: Normal rate and regular rhythm.  Exam reveals no gallop and no friction rub.   No murmur heard. Pulmonary/Chest: Effort normal. No  respiratory distress. He has no wheezes. He has no rales.  Musculoskeletal:  Right anterior chest wall port intact without signs of infection, well healed scar.  Neurological: He is alert and oriented to person, place, and time.  Skin: Skin is warm and dry. He is not diaphoretic.  Psychiatric: He has a normal mood and affect. His behavior is normal. Thought content normal.    Imaging: No results found.  Labs: Lab Results  Component Value Date   WBC 3.4* 10/14/2013   HCT 40.5 10/14/2013   MCV 90.0 10/14/2013   PLT 178 10/14/2013   NA 142 07/20/2013   K 3.9 07/20/2013   CL 107 07/24/2012   CO2 27 07/20/2013   GLUCOSE 102 07/20/2013   BUN 9.7 07/20/2013   CREATININE 1.1 07/20/2013   CALCIUM 8.9 07/20/2013   PROT 6.5 07/20/2013   ALBUMIN 3.9 07/20/2013   AST 16 07/20/2013   ALT 11 07/20/2013   ALKPHOS 76 07/20/2013   BILITOT 0.41 07/20/2013   GFRNONAA >90 05/25/2012   GFRAA >90 05/25/2012   INR 1.06 06/02/2012    Assessment and Plan: Large B-Cell lymphoma Right port-a-catheter placed 05/2012, no longer needed. Scheduled today for port-a-catheter removal with moderate sedation. Patient has been NPO, no blood thinners taken, afebrile, vancomycin ordered. Risks and Benefits discussed with the patient. All of the patient's questions were answered, patient is agreeable to proceed. Consent signed and in chart.  Tsosie Billing PA-C Interventional Radiology  10/14/13  1:28 PM

## 2013-10-14 NOTE — H&P (Signed)
Patient left Short Stay AMA and did not want to wait to have procedure performed.  Will reschedule port removal.

## 2013-10-21 ENCOUNTER — Other Ambulatory Visit: Payer: Self-pay | Admitting: Radiology

## 2013-10-23 ENCOUNTER — Other Ambulatory Visit: Payer: Self-pay | Admitting: Radiology

## 2013-10-26 ENCOUNTER — Encounter (HOSPITAL_COMMUNITY): Payer: Self-pay

## 2013-10-26 ENCOUNTER — Ambulatory Visit (HOSPITAL_COMMUNITY)
Admission: RE | Admit: 2013-10-26 | Discharge: 2013-10-26 | Disposition: A | Discharge status: Home / Self Care | Payer: Medicaid Other | Source: Ambulatory Visit | Attending: Hematology and Oncology | Admitting: Hematology and Oncology

## 2013-10-26 DIAGNOSIS — Z452 Encounter for adjustment and management of vascular access device: Secondary | ICD-10-CM | POA: Diagnosis present

## 2013-10-26 DIAGNOSIS — C8589 Other specified types of non-Hodgkin lymphoma, extranodal and solid organ sites: Secondary | ICD-10-CM | POA: Diagnosis not present

## 2013-10-26 LAB — CBC
HCT: 43.8 % (ref 39.0–52.0)
Hemoglobin: 14.3 g/dL (ref 13.0–17.0)
MCH: 30 pg (ref 26.0–34.0)
MCHC: 32.6 g/dL (ref 30.0–36.0)
MCV: 91.8 fL (ref 78.0–100.0)
PLATELETS: 194 10*3/uL (ref 150–400)
RBC: 4.77 MIL/uL (ref 4.22–5.81)
RDW: 13.9 % (ref 11.5–15.5)
WBC: 4.2 10*3/uL (ref 4.0–10.5)

## 2013-10-26 LAB — PROTIME-INR
INR: 0.99 (ref 0.00–1.49)
PROTHROMBIN TIME: 13.1 s (ref 11.6–15.2)

## 2013-10-26 MED ORDER — MIDAZOLAM HCL 2 MG/2ML IJ SOLN
INTRAMUSCULAR | Status: AC | PRN
  Administered 2013-10-26: 0.5 mg via INTRAVENOUS

## 2013-10-26 MED ORDER — LIDOCAINE HCL 1 % IJ SOLN
INTRAMUSCULAR | Status: AC
  Filled 2013-10-26: qty 20

## 2013-10-26 MED ORDER — VANCOMYCIN HCL IN DEXTROSE 1-5 GM/200ML-% IV SOLN
1000.0000 mg | INTRAVENOUS | Status: AC
  Administered 2013-10-26: 1000 mg via INTRAVENOUS
  Filled 2013-10-26: qty 200

## 2013-10-26 MED ORDER — DIPHENHYDRAMINE HCL 50 MG/ML IJ SOLN
25.0000 mg | Freq: Once | INTRAMUSCULAR | Status: AC
  Administered 2013-10-26: 25 mg via INTRAVENOUS

## 2013-10-26 MED ORDER — SODIUM CHLORIDE 0.9 % IV SOLN
INTRAVENOUS | Status: DC
  Administered 2013-10-26: 500 mL via INTRAVENOUS

## 2013-10-26 MED ORDER — DIPHENHYDRAMINE HCL 50 MG/ML IJ SOLN
INTRAMUSCULAR | Status: AC
Start: 2013-10-26 — End: 2013-10-26
  Filled 2013-10-26: qty 1

## 2013-10-26 MED ORDER — MIDAZOLAM HCL 2 MG/2ML IJ SOLN
INTRAMUSCULAR | Status: AC
  Filled 2013-10-26: qty 6

## 2013-10-26 MED ORDER — FENTANYL CITRATE 0.05 MG/ML IJ SOLN
INTRAMUSCULAR | Status: AC
Start: 2013-10-26 — End: 2013-10-26
  Filled 2013-10-26: qty 6

## 2013-10-26 MED ORDER — FENTANYL CITRATE 0.05 MG/ML IJ SOLN
INTRAMUSCULAR | Status: AC | PRN
  Administered 2013-10-26: 25 ug via INTRAVENOUS

## 2013-10-26 NOTE — Progress Notes (Signed)
Patient ID: Nicholas King, male   DOB: 1970-09-04, 43 y.o.   MRN: [id number] Pt presents today for port a cath removal. See recent H&P from 10/14/13 for additional details. No change in clinical status since last visit. BP 96/64  HR 61  R 16  TEMP 98.1  O2 SATS 100% RA. Pt awake/alert; chest- CTA bilat; clean, intact rt chest wall PAC; heart- RRR; abd- soft,+BS,NT; ext- no edema. Details/risks of port removal d/w pt with his understanding and consent.

## 2013-10-26 NOTE — Progress Notes (Signed)
Notified Dr.Wagner of pt itching on head and neck, skin is red.  Vancomycin completed at 1030.  Verbal order for iv benadryl 25mg  received.  Dr.Wagner is in a procedure, so he asked to page Rowe Robert to assess the pt.

## 2013-10-26 NOTE — Progress Notes (Signed)
Rowe Robert in room accessing pt.  Itching has improved.  Rowe Robert states if VSS and itching improved then okay for d/c home.  Pt states he is ready to go home.  Accessed vital signs and they are stable, he is afebrile.  Iv site d/c and pressure dressing applied.  Significant other in room while pt changes into clothes and then she is going to get the car.  Will take pt out via wheelchair.

## 2013-10-26 NOTE — Progress Notes (Signed)
Spoke with Rowe Robert and informed him of situation with pt itching and that Dr.Wagner was notified and he wants Allred to see the pt.  Lennette Bihari states he will be down soon.

## 2013-10-26 NOTE — Discharge Instructions (Signed)
Leave dressing on for 24 hours.  You may shower after 24 hours.  Please remove the dressing before you shower.   Incision Care An incision is when a surgeon cuts into your body tissues. After surgery, the incision needs to be cared for properly to prevent infection.  HOME CARE INSTRUCTIONS   Take all medicine as directed by your caregiver. Only take over-the-counter or prescription medicines for pain, discomfort, or fever as directed by your caregiver.  Do not remove your bandage (dressing) or get your incision wet until your surgeon gives you permission. In the event that your dressing becomes wet, dirty, or starts to smell, change the dressing and call your surgeon for instructions as soon as possible.  Take showers. Do not take tub baths, swim, or do anything that may soak the wound until it is healed.  Resume your normal diet and activities as directed or allowed.  Avoid lifting any weight until you are instructed otherwise.  Use anti-itch antihistamine medicine as directed by your caregiver. The wound may itch when it is healing. Do not pick or scratch at the wound.  Follow up with your caregiver for stitch (suture) or staple removal as directed.  Drink enough fluids to keep your urine clear or pale yellow. SEEK MEDICAL CARE IF:   You have redness, swelling, or increasing pain in the wound that is not controlled with medicine.  You have drainage, blood, or pus coming from the wound that lasts longer than 1 day.  You develop muscle aches, chills, or a general ill feeling.  You notice a bad smell coming from the wound or dressing.  Your wound edges separate after the sutures, staples, or skin adhesive strips have been removed.  You develop persistent nausea or vomiting. SEEK IMMEDIATE MEDICAL CARE IF:   You have a fever.  You develop a rash.  You develop dizzy episodes or faint while standing.  You have difficulty breathing.  You develop any reaction or side effects to  medicine given. MAKE SURE YOU:   Understand these instructions.  Will watch your condition.  Will get help right away if you are not doing well or get worse. Document Released: 08/11/2004 Document Revised: 04/16/2011 Document Reviewed: 03/18/2013 [facility] Patient Information 2015 [place], [place]. This information is not intended to replace advice given to you by your health care provider. Make sure you discuss any questions you have with your health care provider. Conscious Sedation, Adult, Care After Refer to this sheet in the next few weeks. These instructions provide you with information on caring for yourself after your procedure. Your health care provider may also give you more specific instructions. Your treatment has been planned according to current medical practices, but problems sometimes occur. Call your health care provider if you have any problems or questions after your procedure. WHAT TO EXPECT AFTER THE PROCEDURE  After your procedure:  You may feel sleepy, clumsy, and have poor balance for several hours.  Vomiting may occur if you eat too soon after the procedure. HOME CARE INSTRUCTIONS  Do not participate in any activities where you could become injured for at least 24 hours. Do not:  Drive.  Swim.  Ride a bicycle.  Operate heavy machinery.  Cook.  Use power tools.  Climb ladders.  Work from a high place.  Do not make important decisions or sign legal documents until you are improved.  If you vomit, drink water, juice, or soup when you can drink without vomiting. Make sure you have  little or no nausea before eating solid foods.  Only take over-the-counter or prescription medicines for pain, discomfort, or fever as directed by your health care provider.  Make sure you and your family fully understand everything about the medicines given to you, including what side effects may occur.  You should not drink alcohol, take sleeping pills, or take medicines  that cause drowsiness for at least 24 hours.  If you smoke, do not smoke without supervision.  If you are feeling better, you may resume normal activities 24 hours after you were sedated.  Keep all appointments with your health care provider. SEEK MEDICAL CARE IF:  Your skin is pale or bluish in color.  You continue to feel nauseous or vomit.  Your pain is getting worse and is not helped by medicine.  You have bleeding or swelling.  You are still sleepy or feeling clumsy after 24 hours. SEEK IMMEDIATE MEDICAL CARE IF:  You develop a rash.  You have difficulty breathing.  You develop any type of allergic problem.  You have a fever. MAKE SURE YOU:  Understand these instructions.  Will watch your condition.  Will get help right away if you are not doing well or get worse. Document Released: 11/12/2012 Document Reviewed: 11/12/2012 [facility] Patient Information 2015 [place], [place]. This information is not intended to replace advice given to you by your health care provider. Make sure you discuss any questions you have with your health care provider.

## 2013-10-26 NOTE — Procedures (Signed)
Procedure: Rt IJ port catheter removal, two layers of vicryl suture.  Entire single lumen port catheter removed.  No complication No significant blood loss Occlusive dressing placed Signed,  Johny Shears, DO

## 2014-01-05 DIAGNOSIS — IMO0002 Reserved for concepts with insufficient information to code with codable children: Secondary | ICD-10-CM

## 2014-01-05 HISTORY — DX: Reserved for concepts with insufficient information to code with codable children: IMO0002

## 2014-01-10 ENCOUNTER — Emergency Department (HOSPITAL_COMMUNITY): Payer: Medicaid Other

## 2014-01-10 ENCOUNTER — Encounter (HOSPITAL_COMMUNITY): Payer: Self-pay | Admitting: Emergency Medicine

## 2014-01-10 ENCOUNTER — Emergency Department (HOSPITAL_COMMUNITY)
Admission: EM | Admit: 2014-01-10 | Discharge: 2014-01-11 | Disposition: A | Discharge status: Home / Self Care | Payer: Medicaid Other | Attending: Emergency Medicine | Admitting: Emergency Medicine

## 2014-01-10 DIAGNOSIS — G629 Polyneuropathy, unspecified: Secondary | ICD-10-CM | POA: Insufficient documentation

## 2014-01-10 DIAGNOSIS — Z880 Allergy status to penicillin: Secondary | ICD-10-CM | POA: Diagnosis not present

## 2014-01-10 DIAGNOSIS — Z859 Personal history of malignant neoplasm, unspecified: Secondary | ICD-10-CM | POA: Diagnosis not present

## 2014-01-10 DIAGNOSIS — Y998 Other external cause status: Secondary | ICD-10-CM | POA: Diagnosis not present

## 2014-01-10 DIAGNOSIS — Z8719 Personal history of other diseases of the digestive system: Secondary | ICD-10-CM | POA: Diagnosis not present

## 2014-01-10 DIAGNOSIS — Z923 Personal history of irradiation: Secondary | ICD-10-CM | POA: Diagnosis not present

## 2014-01-10 DIAGNOSIS — R1310 Dysphagia, unspecified: Secondary | ICD-10-CM

## 2014-01-10 DIAGNOSIS — Y9289 Other specified places as the place of occurrence of the external cause: Secondary | ICD-10-CM | POA: Diagnosis not present

## 2014-01-10 DIAGNOSIS — W1789XA Other fall from one level to another, initial encounter: Secondary | ICD-10-CM | POA: Insufficient documentation

## 2014-01-10 DIAGNOSIS — S199XXA Unspecified injury of neck, initial encounter: Secondary | ICD-10-CM | POA: Diagnosis present

## 2014-01-10 DIAGNOSIS — Y9389 Activity, other specified: Secondary | ICD-10-CM | POA: Insufficient documentation

## 2014-01-10 DIAGNOSIS — S3991XA Unspecified injury of abdomen, initial encounter: Secondary | ICD-10-CM | POA: Diagnosis not present

## 2014-01-10 DIAGNOSIS — S32000A Wedge compression fracture of unspecified lumbar vertebra, initial encounter for closed fracture: Secondary | ICD-10-CM | POA: Diagnosis not present

## 2014-01-10 DIAGNOSIS — Z8701 Personal history of pneumonia (recurrent): Secondary | ICD-10-CM | POA: Diagnosis not present

## 2014-01-10 DIAGNOSIS — Z8572 Personal history of non-Hodgkin lymphomas: Secondary | ICD-10-CM | POA: Insufficient documentation

## 2014-01-10 DIAGNOSIS — Z720 Tobacco use: Secondary | ICD-10-CM | POA: Diagnosis not present

## 2014-01-10 DIAGNOSIS — C8330 Diffuse large B-cell lymphoma, unspecified site: Secondary | ICD-10-CM

## 2014-01-10 DIAGNOSIS — Z79899 Other long term (current) drug therapy: Secondary | ICD-10-CM | POA: Diagnosis not present

## 2014-01-10 DIAGNOSIS — S129XXA Fracture of neck, unspecified, initial encounter: Secondary | ICD-10-CM

## 2014-01-10 LAB — COMPREHENSIVE METABOLIC PANEL
ALBUMIN: 3.9 g/dL (ref 3.5–5.2)
ALT: 39 U/L (ref 0–53)
AST: 68 U/L — ABNORMAL HIGH (ref 0–37)
Alkaline Phosphatase: 68 U/L (ref 39–117)
Anion gap: 13 (ref 5–15)
BUN: 6 mg/dL (ref 6–23)
CO2: 25 mEq/L (ref 19–32)
Calcium: 8.8 mg/dL (ref 8.4–10.5)
Chloride: 99 mEq/L (ref 96–112)
Creatinine, Ser: 0.94 mg/dL (ref 0.50–1.35)
GFR calc Af Amer: >90 mL/min (ref >90)
GFR calc non Af Amer: >90 mL/min (ref >90)
Glucose, Bld: 226 mg/dL — ABNORMAL HIGH (ref 70–99)
POTASSIUM: 4.6 meq/L (ref 3.7–5.3)
SODIUM: 137 meq/L (ref 137–147)
TOTAL PROTEIN: 6.9 g/dL (ref 6.0–8.3)
Total Bilirubin: 0.7 mg/dL (ref 0.3–1.2)

## 2014-01-10 LAB — I-STAT CHEM 8, ED
BUN: 5 mg/dL — AB (ref 6–23)
Calcium, Ion: 1.07 mmol/L — ABNORMAL LOW (ref 1.12–1.23)
Chloride: 100 mEq/L (ref 96–112)
Creatinine, Ser: 0.9 mg/dL (ref 0.50–1.35)
Glucose, Bld: 239 mg/dL — ABNORMAL HIGH (ref 70–99)
HCT: 48 % (ref 39.0–52.0)
Hemoglobin: 16.3 g/dL (ref 13.0–17.0)
Potassium: 4.4 mEq/L (ref 3.7–5.3)
SODIUM: 138 meq/L (ref 137–147)
TCO2: 25 mmol/L (ref 0–100)

## 2014-01-10 LAB — CBC
HEMATOCRIT: 43.4 % (ref 39.0–52.0)
HEMOGLOBIN: 14.7 g/dL (ref 13.0–17.0)
MCH: 30.3 pg (ref 26.0–34.0)
MCHC: 33.9 g/dL (ref 30.0–36.0)
MCV: 89.5 fL (ref 78.0–100.0)
Platelets: 205 10*3/uL (ref 150–400)
RBC: 4.85 MIL/uL (ref 4.22–5.81)
RDW: 13.3 % (ref 11.5–15.5)
WBC: 5.9 10*3/uL (ref 4.0–10.5)

## 2014-01-10 LAB — PROTIME-INR
INR: 1.11 (ref 0.00–1.49)
PROTHROMBIN TIME: 14.4 s (ref 11.6–15.2)

## 2014-01-10 LAB — SAMPLE TO BLOOD BANK

## 2014-01-10 LAB — ETHANOL: Alcohol, Ethyl (B): <11 mg/dL (ref 0–11)

## 2014-01-10 MED ORDER — IOHEXOL 300 MG/ML  SOLN
100.0000 mL | Freq: Once | INTRAMUSCULAR | Status: AC | PRN
  Administered 2014-01-10: 100 mL via INTRAVENOUS

## 2014-01-10 MED ORDER — MORPHINE SULFATE 4 MG/ML IJ SOLN
4.0000 mg | Freq: Once | INTRAMUSCULAR | Status: AC
Start: 2014-01-10 — End: 2014-01-10
  Administered 2014-01-10: 4 mg via INTRAVENOUS
  Filled 2014-01-10: qty 1

## 2014-01-10 MED ORDER — OXYCODONE-ACETAMINOPHEN 10-325 MG PO TABS: 1.0000 | ORAL_TABLET | Freq: Four times a day (QID) | ORAL | Status: DC | PRN

## 2014-01-10 MED ORDER — FENTANYL CITRATE 0.05 MG/ML IJ SOLN
50.0000 ug | Freq: Once | INTRAMUSCULAR | Status: AC
  Administered 2014-01-10: 50 ug via INTRAVENOUS
  Filled 2014-01-10: qty 2

## 2014-01-10 MED ORDER — SODIUM CHLORIDE 0.9 % IV BOLUS (SEPSIS)
1000.0000 mL | Freq: Once | INTRAVENOUS | Status: AC
  Administered 2014-01-10: 1000 mL via INTRAVENOUS

## 2014-01-10 NOTE — ED Notes (Signed)
Pt resting quietly at this time.  Wife at bedside.  Waiting for Lumbar corsett

## 2014-01-10 NOTE — ED Notes (Signed)
Pain meds given, pt resting quietly at this time.  Pt remains alert and oriented x's 3.  Wife remains at bedside

## 2014-01-10 NOTE — ED Provider Notes (Signed)
CSN: [id number]     Arrival date & time 01/10/14  1639 History   First MD Initiated Contact with Patient 01/10/14 1646     Chief Complaint  Patient presents with  . Fall  . Trauma  . Neck Pain  . Back Pain     (Consider location/radiation/quality/duration/timing/severity/associated sxs/prior Treatment) Patient is a 43 y.o. male presenting with trauma.  Trauma Mechanism of injury: fall Injury location: torso Incident location: home Arrived directly from scene: yes   Fall:      Fall occurred: from a roof      Height of fall: 16 feet      Impact surface: dirt      Point of impact: back      Entrapped after fall: no      Suspicion of alcohol use: yes      Suspicion of drug use: yes  EMS/PTA data:      Bystander interventions: bystander C-spine precautions      Oriented to: person, place, situation and time      Loss of consciousness: no      Amnesic to event: no      Airway interventions: none      IV access: established  Current symptoms:      Associated symptoms:            Reports back pain.            Denies loss of consciousness, nausea and vomiting.    Past Medical History  Diagnosis Date  . GERD (gastroesophageal reflux disease)   . Cancer   . Lymphoma   . Dysphagia, unspecified(787.20) 05/16/2012  . Pneumonia     April 2014  . 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 Neuropathic pain 11/06/2012  . Radiation 10/30/12-11/25/12    Bilateral Neck/Tonsil  . Neuropathy 12/26/2012  . Bone pain 12/26/2012  . Lumbago 01/06/2013  . Radiation 10/30/12-11/25/12    Bilateral neck and tonsils   Past Surgical History  Procedure Laterality Date  . No past surgeries    . Direct laryngoscopy Left 05/08/2012    Procedure: DIRECT LARYNGOSCOPY with biopsy left tonsil;  Surgeon: Melissa Montane, MD;  Location: [facility] OR;  Service: ENT;  Laterality: Left;  . Right eyebrown bone      Right frontal skull fracture   Family History  Problem Relation Age of Onset  . Cancer Father      lung  . Diabetes Father   . Cancer Maternal Aunt     gyn cancer, pancreas  . Cancer Paternal Aunt     pancreas cancer  . Cancer Paternal Uncle     unk  . Cancer Sister    History  Substance Use Topics  . Smoking status: Current Every Day Smoker -- 0.50 packs/day for 25 years    Types: Cigarettes  . Smokeless tobacco: Never Used  . Alcohol Use: Yes     Comment: seldom    Review of Systems  HENT: Negative for congestion and drooling.   Respiratory: Negative for apnea and chest tightness.   Gastrointestinal: Negative for nausea, vomiting and diarrhea.  Endocrine: Negative for polydipsia and polyuria.  Genitourinary: Negative for dysuria and flank pain.  Musculoskeletal: Positive for back pain.  Neurological: Negative for loss of consciousness.  All other systems reviewed and are negative.     Allergies  Vancomycin; Cymbalta; Penicillins; Amoxicillin; and Hydrocodone  Home Medications   Prior to Admission medications   Medication Sig Start Date End Date Taking? Authorizing  Provider  acetaminophen (TYLENOL) 500 MG tablet Take 1,000 mg by mouth every 6 (six) hours as needed for mild pain or moderate pain.   Yes Historical Provider, MD  Cholecalciferol (VITAMIN D-3) 5000 UNITS TABS Take 1 tablet by mouth daily.   Yes Historical Provider, MD  gabapentin (NEURONTIN) 600 MG tablet Take 600 mg by mouth 4 (four) times daily.   Yes Historical Provider, MD  ibuprofen (ADVIL,MOTRIN) 200 MG tablet Take 400 mg by mouth every 6 (six) hours as needed for mild pain or moderate pain.   Yes Historical Provider, MD  gabapentin (NEURONTIN) 300 MG capsule Take 300 mg by mouth 4 (four) times daily.    Historical Provider, MD  oxyCODONE-acetaminophen (PERCOCET) 10-325 MG per tablet Take 1 tablet by mouth every 6 (six) hours as needed for pain. 01/10/14   Corene Cornea Tiani Stanbery, MD   BP 130/94 mmHg  Pulse 88  Temp(Src) 99.4 F (37.4 C) (Oral)  Resp 22  Ht 5\' 8"  (1.727 m)  Wt 102 lb (46.267 kg)  BMI  15.51 kg/m2  SpO2 97% Physical Exam  Constitutional: He is oriented to person, place, and time. He appears well-developed and well-nourished.  HENT:  Head: Normocephalic and atraumatic.  Eyes: Conjunctivae and EOM are normal.  Neck: Normal range of motion. Neck supple.  Cardiovascular: Normal rate and regular rhythm.   Pulmonary/Chest: Effort normal. No respiratory distress.  Abdominal: Soft. There is tenderness (minimal).  Musculoskeletal: Normal range of motion. He exhibits tenderness (C/T/L spine). He exhibits no edema.  Neurological: He is alert and oriented to person, place, and time.  Skin: Skin is warm and dry.  Nursing note and vitals reviewed.   ED Course  Procedures (including critical care time) Labs Review Labs Reviewed  COMPREHENSIVE METABOLIC PANEL - Abnormal; Notable for the following:    Glucose, Bld 226 (*)    AST 68 (*)    All other components within normal limits  I-STAT CHEM 8, ED - Abnormal; Notable for the following:    BUN 5 (*)    Glucose, Bld 239 (*)    Calcium, Ion 1.07 (*)    All other components within normal limits  CBC  ETHANOL  PROTIME-INR  CDS SEROLOGY  SAMPLE TO BLOOD BANK    Imaging Review Ct Head Wo Contrast  01/10/2014   CLINICAL DATA:  43 year old male with history of trauma after falling off a roof onto his back. Head and neck pain.  EXAM: CT HEAD WITHOUT CONTRAST  CT CERVICAL SPINE WITHOUT CONTRAST  TECHNIQUE: Multidetector CT imaging of the head and cervical spine was performed following the standard protocol without intravenous contrast. Multiplanar CT image reconstructions of the cervical spine were also generated.  COMPARISON:  Head CT 05/25/2012.  C-spine CT 05/25/2012.  FINDINGS: CT HEAD FINDINGS  No acute displaced skull fractures are identified. No acute intracranial abnormality. Specifically, no evidence of acute post-traumatic intracranial hemorrhage, no definite regions of acute/subacute cerebral ischemia, no focal mass, mass  effect, hydrocephalus or abnormal intra or extra-axial fluid collections. The visualized paranasal sinuses and mastoids are well pneumatized. Severe left-sided nasal septal deviation with bony nasal spur incidentally noted.  CT CERVICAL SPINE FINDINGS  There is a subtle nondisplaced fracture through the medial aspect of the left facet at C7. No other acute displaced fractures identified. Alignment is anatomic. Prevertebral soft tissues are normal. Visualized portions of the lung apices demonstrate emphysematous changes.  IMPRESSION: 1. No acute displaced skull fracture or signs of significant acute traumatic injury to the  brain. 2. Nondisplaced fracture through the left C7 facet. No other signs of significant acute traumatic injury to the cervical spine. Critical Value/emergent results were called by telephone at the time of interpretation on 01/10/2014 at 5:56 pm to Dr. Merrily Pew, who verbally acknowledged these results.   Electronically Signed   By: Vinnie Langton M.D.   On: 01/10/2014 17:59   Ct Chest W Contrast  01/10/2014   CLINICAL DATA:  Pudding up Christmas deck reactions, fell off roof landing on neck and back, generalized pain, history of lymphoma post radiation therapy  EXAM: CT CHEST, ABDOMEN, AND PELVIS WITH CONTRAST  TECHNIQUE: Multidetector CT imaging of the chest, abdomen and pelvis was performed following the standard protocol during bolus administration of intravenous contrast. Sagittal and coronal MPR images reconstructed from axial data set.  CONTRAST:  129mL OMNIPAQUE IOHEXOL 300 MG/ML SOLN IV. No oral contrast administered.  COMPARISON:  PET-CT 10/24/2012  FINDINGS: CT CHEST FINDINGS  11 mm low-attenuation LEFT thyroid nodule, decreased since 05/16/2012.  Upper normal sized hilar nodes 9 mm short axis LEFT image 29 and 10 mm RIGHT image 27.  Residual thymic tissue in anterior mediastinum unchanged.  No thoracic adenopathy.  Few tiny scattered pulmonary nodules bilaterally unchanged.  No  pulmonary infiltrate, contusion, pleural effusion, or pneumothorax.  Cavitary lesion LEFT upper lobe approximately 17 x 8 mm image 15 overall minimally decreased in size but demonstrating slightly increased wall thickening.  No new/enlarging mass or nodule identified.  No thoracic spine fractures.  CT ABDOMEN AND PELVIS FINDINGS  Tiny LEFT renal cyst.  Liver, spleen, pancreas, kidneys, and adrenal glands otherwise normal.  Normal appendix.  Stomach and bowel loops grossly unremarkable for exam lacking GI contrast.  No mass, adenopathy, free fluid, free air, or hernia.  Superior endplate compression fracture L1 vertebral body with approximately 20-25% anterior height loss.  Mild retropulsion of posterior superior vertebral body fragment.  No obvious epidural hematoma or significant spinal stenosis.  Remaining lumbar vertebrae and pelvis appear intact.  IMPRESSION: No acute intra thoracic abnormalities.  Stable tiny nonspecific pulmonary nodules.  Persistent cavitary lesion LEFT upper lobe, minimally smaller in size though demonstrating slightly increased wall thickening versus prior PET-CT, could be related to scarring but recommend assessment on followup PET-CT to definitively exclude residual tumor.  No acute intra-abdominal or intrapelvic abnormalities.  Superior endplate compression fracture of L1 vertebral body was 20-25% anterior height loss and minimal retropulsion of posterior vertebral body fragment into spinal canal as above.   Electronically Signed   By: Lavonia Dana M.D.   On: 01/10/2014 18:03   Ct Cervical Spine Wo Contrast  01/10/2014   CLINICAL DATA:  43 year old male with history of trauma after falling off a roof onto his back. Head and neck pain.  EXAM: CT HEAD WITHOUT CONTRAST  CT CERVICAL SPINE WITHOUT CONTRAST  TECHNIQUE: Multidetector CT imaging of the head and cervical spine was performed following the standard protocol without intravenous contrast. Multiplanar CT image reconstructions of the  cervical spine were also generated.  COMPARISON:  Head CT 05/25/2012.  C-spine CT 05/25/2012.  FINDINGS: CT HEAD FINDINGS  No acute displaced skull fractures are identified. No acute intracranial abnormality. Specifically, no evidence of acute post-traumatic intracranial hemorrhage, no definite regions of acute/subacute cerebral ischemia, no focal mass, mass effect, hydrocephalus or abnormal intra or extra-axial fluid collections. The visualized paranasal sinuses and mastoids are well pneumatized. Severe left-sided nasal septal deviation with bony nasal spur incidentally noted.  CT CERVICAL SPINE FINDINGS  There is a subtle nondisplaced fracture through the medial aspect of the left facet at C7. No other acute displaced fractures identified. Alignment is anatomic. Prevertebral soft tissues are normal. Visualized portions of the lung apices demonstrate emphysematous changes.  IMPRESSION: 1. No acute displaced skull fracture or signs of significant acute traumatic injury to the brain. 2. Nondisplaced fracture through the left C7 facet. No other signs of significant acute traumatic injury to the cervical spine. Critical Value/emergent results were called by telephone at the time of interpretation on 01/10/2014 at 5:56 pm to Dr. Merrily Pew, who verbally acknowledged these results.   Electronically Signed   By: Vinnie Langton M.D.   On: 01/10/2014 17:59   Ct Abdomen Pelvis W Contrast  01/10/2014   CLINICAL DATA:  Pudding up Christmas deck reactions, fell off roof landing on neck and back, generalized pain, history of lymphoma post radiation therapy  EXAM: CT CHEST, ABDOMEN, AND PELVIS WITH CONTRAST  TECHNIQUE: Multidetector CT imaging of the chest, abdomen and pelvis was performed following the standard protocol during bolus administration of intravenous contrast. Sagittal and coronal MPR images reconstructed from axial data set.  CONTRAST:  137mL OMNIPAQUE IOHEXOL 300 MG/ML SOLN IV. No oral contrast administered.   COMPARISON:  PET-CT 10/24/2012  FINDINGS: CT CHEST FINDINGS  11 mm low-attenuation LEFT thyroid nodule, decreased since 05/16/2012.  Upper normal sized hilar nodes 9 mm short axis LEFT image 29 and 10 mm RIGHT image 27.  Residual thymic tissue in anterior mediastinum unchanged.  No thoracic adenopathy.  Few tiny scattered pulmonary nodules bilaterally unchanged.  No pulmonary infiltrate, contusion, pleural effusion, or pneumothorax.  Cavitary lesion LEFT upper lobe approximately 17 x 8 mm image 15 overall minimally decreased in size but demonstrating slightly increased wall thickening.  No new/enlarging mass or nodule identified.  No thoracic spine fractures.  CT ABDOMEN AND PELVIS FINDINGS  Tiny LEFT renal cyst.  Liver, spleen, pancreas, kidneys, and adrenal glands otherwise normal.  Normal appendix.  Stomach and bowel loops grossly unremarkable for exam lacking GI contrast.  No mass, adenopathy, free fluid, free air, or hernia.  Superior endplate compression fracture L1 vertebral body with approximately 20-25% anterior height loss.  Mild retropulsion of posterior superior vertebral body fragment.  No obvious epidural hematoma or significant spinal stenosis.  Remaining lumbar vertebrae and pelvis appear intact.  IMPRESSION: No acute intra thoracic abnormalities.  Stable tiny nonspecific pulmonary nodules.  Persistent cavitary lesion LEFT upper lobe, minimally smaller in size though demonstrating slightly increased wall thickening versus prior PET-CT, could be related to scarring but recommend assessment on followup PET-CT to definitively exclude residual tumor.  No acute intra-abdominal or intrapelvic abnormalities.  Superior endplate compression fracture of L1 vertebral body was 20-25% anterior height loss and minimal retropulsion of posterior vertebral body fragment into spinal canal as above.   Electronically Signed   By: Lavonia Dana M.D.   On: 01/10/2014 18:03   Dg Pelvis Portable  01/10/2014   CLINICAL  DATA:  Fall from roof landing on back. Neck and back pain. History of lymphoma.  EXAM: PORTABLE PELVIS 1-2 VIEWS  COMPARISON:  Multiple exams, including 11/18/2012  FINDINGS: There is no evidence of pelvic fracture or diastasis. No pelvic bone lesions are seen.  IMPRESSION: Negative.   Electronically Signed   By: Sherryl Barters M.D.   On: 01/10/2014 17:25   Dg Chest Portable 1 View  01/10/2014   CLINICAL DATA:  43 year old male with history of trauma after falling off a roof  onto his back.  EXAM: PORTABLE CHEST - 1 VIEW  COMPARISON:  Chest x-ray 07/03/2012.  FINDINGS: There is slight upper lung predominant interstitial prominence, which is new compared to the prior examination. No pneumothorax. No frank airspace consolidation. No pleural effusions. No cephalization of pulmonary vasculature to suggest pulmonary edema. Heart size is normal. Upper mediastinal contours are within normal limits. Bony thorax appears grossly intact.  IMPRESSION: 1. Vague prominence of the interstitium most evident in the upper lungs. This is of uncertain etiology and significance, but in the setting of trauma from a fall, this could represent and unusual distribution for aspiration or mild pulmonary contusion. 2. No pneumothorax.   Electronically Signed   By: Vinnie Langton M.D.   On: 01/10/2014 17:26     EKG Interpretation None      MDM   Final diagnoses:  Cervical spine fracture, initial encounter  Lumbar compression fracture, closed, initial encounter    43 year old male fell off of the roof and landed on his back no loss of consciousness. Patient with initially left arm paresthesias. No numbness weakness or motor changes. Full scans and labs were done revealing a transverse process fracture in his neck and a compression fracture in the lumbar spine with minimal retropulsion. Still neurologically intact neurosurgery was spoke with over the phone, Dr. Hal Neer, who recommended course continued C-spine and to  follow-up with them in clinic in 1-2 weeks. Patient's pain controlled and stable for discharge.    Merrily Pew, MD 01/11/14 [place], MD 01/13/14 [phone]

## 2014-01-10 NOTE — ED Notes (Signed)
Pt resting quietly at this time with eyes closed.  Wife remains at bedside.  Warm blankets given to pt.

## 2014-01-10 NOTE — ED Notes (Signed)
Received pt via EMS with c/o while putting Christmas Decorations on roof pt slid off landing on back. Pt c/o Neck and back pain, numbness to left leg and arm. Pt given 500 ML of D5NS by EMS and 6mg  of morphine.

## 2014-01-10 NOTE — ED Notes (Signed)
MD in to up date on pt's condition.  Wife remains at bedside.  Pt resting, c-collar remains in place

## 2014-01-10 NOTE — ED Notes (Signed)
Pain med given, wife at beside.  Pt remains alert and oriented x's 3

## 2014-01-10 NOTE — Progress Notes (Signed)
Chaplain responded to Level 2 trauma. Wife Butch Penny arrived shortly after patient.  She has been very emotional since she arrived, stating that patient has not been working because he had cancer and has just completed a year of remission.  Chaplain encouraged wife to contact family to be with her while she remains with patient. She did contact a daughter.  Note:  Patient's wife's oldest daughter works at [employer] as a Electrical engineer (Shela Commons, recently married, was [employer]). She alluded to strained relationships with some family members (mostly siblings of patient and wife). Wife mentioned serious financial concerns including difficulty buying food. She is also worried about Medicaid and hospital bills. Recommend FU by Education officer, museum and will recommend that a chaplain follows up with family. Provided nutrition for wife and prayer at bedside for both patient and wife.  Both are very anxious about diagnosis, fearing the worst (broken back or neck, paralysis). Please call as needed through the evening and our department will FU tomorrow.    Luana Shu [phone]    01/10/14 1845  Clinical Encounter Type  Visited With Patient and family together  Visit Type Spiritual support  Recommendations Spiritual care and social work F/U recommended - see note  Spiritual Encounters  Spiritual Needs Emotional  Stress Factors  Patient Stress Factors Major life changes  Family Stress Factors Financial concerns

## 2014-01-10 NOTE — Progress Notes (Signed)
Pt came in on 3L [place]. MD requested pt to be on RA to check SPO2. Pt 98% on RA. RT will continue to monitor.

## 2014-01-10 NOTE — ED Notes (Signed)
Pt resting quietly at this time with c-collar in place.  Wife remains at bedside.  Pt remains alert and oriented

## 2014-01-10 NOTE — ED Notes (Signed)
Pt to CT

## 2014-01-10 NOTE — ED Notes (Signed)
Pt c/o pain in back st's pain is #10 on pain scale 0/10.  Wife remains at bedside

## 2014-01-11 LAB — CDS SEROLOGY

## 2014-01-11 MED ORDER — OXYCODONE-ACETAMINOPHEN 10-325 MG PO TABS: 1.0000 | ORAL_TABLET | Freq: Four times a day (QID) | ORAL | Status: DC | PRN

## 2014-01-11 MED ORDER — OXYCODONE HCL 5 MG PO TABS
10.0000 mg | ORAL_TABLET | Freq: Once | ORAL | Status: AC
  Administered 2014-01-11: 10 mg via ORAL
  Filled 2014-01-11: qty 2

## 2014-01-11 NOTE — ED Notes (Signed)
Aspen cervicle collar applied,  Back brace applied by ortho tech

## 2014-01-11 NOTE — ED Notes (Signed)
Continue to wait for lumbar corsett, pain med given, pt remains alert and oriented.  Pt's wife at bedside

## 2014-01-19 ENCOUNTER — Telehealth: Payer: Self-pay | Admitting: Hematology and Oncology

## 2014-01-19 ENCOUNTER — Other Ambulatory Visit (HOSPITAL_BASED_OUTPATIENT_CLINIC_OR_DEPARTMENT_OTHER): Payer: Medicaid Other

## 2014-01-19 ENCOUNTER — Ambulatory Visit (HOSPITAL_BASED_OUTPATIENT_CLINIC_OR_DEPARTMENT_OTHER): Payer: Medicaid Other | Admitting: Hematology and Oncology

## 2014-01-19 VITALS — BP 137/99 | HR 82 | Temp 97.8°F | Resp 20 | Ht 68.0 in | Wt 110.1 lb

## 2014-01-19 DIAGNOSIS — R1310 Dysphagia, unspecified: Secondary | ICD-10-CM

## 2014-01-19 DIAGNOSIS — IMO0002 Reserved for concepts with insufficient information to code with codable children: Secondary | ICD-10-CM

## 2014-01-19 DIAGNOSIS — C8330 Diffuse large B-cell lymphoma, unspecified site: Secondary | ICD-10-CM

## 2014-01-19 DIAGNOSIS — T148 Other injury of unspecified body region: Secondary | ICD-10-CM

## 2014-01-19 DIAGNOSIS — S129XXA Fracture of neck, unspecified, initial encounter: Secondary | ICD-10-CM

## 2014-01-19 DIAGNOSIS — W19XXXA Unspecified fall, initial encounter: Secondary | ICD-10-CM

## 2014-01-19 LAB — CBC WITH DIFFERENTIAL/PLATELET
BASO%: 0.4 % (ref 0.0–2.0)
BASOS ABS: 0 10*3/uL (ref 0.0–0.1)
EOS%: 0.6 % (ref 0.0–7.0)
Eosinophils Absolute: 0 10*3/uL (ref 0.0–0.5)
HEMATOCRIT: 43.6 % (ref 38.4–49.9)
HGB: 14.4 g/dL (ref 13.0–17.1)
LYMPH%: 16.2 % (ref 14.0–49.0)
MCH: 29.9 pg (ref 27.2–33.4)
MCHC: 33 g/dL (ref 32.0–36.0)
MCV: 90.7 fL (ref 79.3–98.0)
MONO#: 0.4 10*3/uL (ref 0.1–0.9)
MONO%: 8.7 % (ref 0.0–14.0)
NEUT#: 3.7 10*3/uL (ref 1.5–6.5)
NEUT%: 74.1 % (ref 39.0–75.0)
PLATELETS: 277 10*3/uL (ref 140–400)
RBC: 4.81 10*6/uL (ref 4.20–5.82)
RDW: 13.7 % (ref 11.0–14.6)
WBC: 5 10*3/uL (ref 4.0–10.3)
lymph#: 0.8 10*3/uL — ABNORMAL LOW (ref 0.9–3.3)

## 2014-01-19 LAB — COMPREHENSIVE METABOLIC PANEL (CC13)
ALBUMIN: 3.8 g/dL (ref 3.5–5.0)
ALK PHOS: 72 U/L (ref 40–150)
ALT: 21 U/L (ref 0–55)
AST: 19 U/L (ref 5–34)
Anion Gap: 9 mEq/L (ref 3–11)
BUN: 11.3 mg/dL (ref 7.0–26.0)
CO2: 30 mEq/L — ABNORMAL HIGH (ref 22–29)
Calcium: 9.4 mg/dL (ref 8.4–10.4)
Chloride: 102 mEq/L (ref 98–109)
Creatinine: 1 mg/dL (ref 0.7–1.3)
GLUCOSE: 89 mg/dL (ref 70–140)
POTASSIUM: 4.1 meq/L (ref 3.5–5.1)
SODIUM: 141 meq/L (ref 136–145)
TOTAL PROTEIN: 7.1 g/dL (ref 6.4–8.3)
Total Bilirubin: 0.46 mg/dL (ref 0.20–1.20)

## 2014-01-19 LAB — LACTATE DEHYDROGENASE (CC13): LDH: 182 U/L (ref 125–245)

## 2014-01-19 MED ORDER — OXYCODONE HCL 10 MG PO TABS: 10.0000 mg | ORAL_TABLET | Freq: Three times a day (TID) | ORAL | Status: DC | PRN

## 2014-01-19 NOTE — Assessment & Plan Note (Signed)
This is due to recent fall. MRI is pending. He has neurosurgery appointment pending. The patient wanted a second opinion at [facility]. He requested a referral to see another one if he is not satisfied with the neurosurgery appointment tomorrow. He requested pain medication as he ran out. We discussed about narcotic refill policy. I will give him 1 time prescription to last until his next pain clinic appointment, which will be in 02/02/2014, according to his patient. The patient is aware that I am not responsible if he gets fired from his pain clinic for getting prescription refill from me. I will not manage his chronic pain management.

## 2014-01-19 NOTE — Telephone Encounter (Signed)
Pt confirmed labs/ov per 12/15 POF, gave pt AVS.... KJ  °

## 2014-01-19 NOTE — Progress Notes (Signed)
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   SUMMARY OF ONCOLOGIC HISTORY: #08 March 2012 He developed left side sore throat; progressive; 2 months. Hoarse voice. Dysphagia with solid (better with liquid). 40 lb weight loss 3 weeks. #2 CT soft tissue the neck on 04/17/2012 showed extensive soft tissue swelling of the left tonsil without abscess. There were shotty lymph nodes in the neck bilaterally.  #3 Biopsy by Dr. Janace Hoard on 05/08/2012 showed large B-cell lymphoma in the posterior pharynx, and in the right posterior pharyngeal wall.  #4 He was started on chemo R-CHOP on 06/09/2012 #5 10/03/2012: He completed 6 cycles of chemo along with 4 rounds of intrathecal chemotherapy #6 10/24/2012: Restaging PET scan showed complete response #7 10/30/12: Patient was started on consolidation RT #8 01/10/2014, he had accidental fall with compression fracture. CT scan of the chest, abdomen and pelvis show no evidence of recurrence of lymphoma  INTERVAL HISTORY: Please see below for problem oriented charting. He complained of severe back pain, 10 out of 10 pain. He ran out of pain medication. He denies recent infection. No new lymphadenopathy. He is attempting to quit smoking.  REVIEW OF SYSTEMS:   Constitutional: Denies fevers, chills or abnormal weight loss Eyes: Denies blurriness of vision Ears, nose, mouth, throat, and face: Denies mucositis or sore throat Respiratory: Denies cough, dyspnea or wheezes Cardiovascular: Denies palpitation, chest discomfort or lower extremity  swelling Gastrointestinal:  Denies nausea, heartburn or change in bowel habits Skin: Denies abnormal skin rashes Lymphatics: Denies new lymphadenopathy or easy bruising Neurological:Denies numbness, tingling or new weaknesses Behavioral/Psych: Mood is stable, no new changes  All other systems were reviewed with the patient and are negative.  I have reviewed the past medical history, past surgical history, social history and family history with the patient and they are unchanged from previous note.  ALLERGIES:  is allergic to vancomycin; cymbalta; penicillins; amoxicillin; and hydrocodone.  MEDICATIONS:  Current Outpatient Prescriptions  Medication Sig Dispense Refill  . acetaminophen (TYLENOL) 500 MG tablet Take 1,000 mg by mouth every 6 (six) hours as needed for mild pain or moderate pain.    . Cholecalciferol (VITAMIN D-3) 5000 UNITS TABS Take 1 tablet by mouth daily.    Marland Kitchen gabapentin (NEURONTIN) 600 MG tablet Take 600 mg by mouth 4 (four) times daily.    Marland Kitchen ibuprofen (ADVIL,MOTRIN) 200 MG tablet Take 400 mg by mouth every 6 (six) hours as needed for mild pain or moderate pain.    Marland Kitchen oxyCODONE-acetaminophen (PERCOCET) 10-325 MG per tablet Take 1 tablet by mouth every 6 (six) hours as needed for pain. 60 tablet 0  . oxyCODONE 10 MG TABS Take 1 tablet (10 mg total) by mouth 3 (three) times daily as needed for severe pain. 42 tablet 0   No current facility-administered medications for this visit.    PHYSICAL EXAMINATION: ECOG PERFORMANCE STATUS: 1 - Symptomatic but completely ambulatory  Filed Vitals:   01/19/14 1454  BP: 137/99  Pulse: 82  Temp: 97.8 F (36.6 C)  Resp: 20   Filed Weights   01/19/14 1454  Weight: 110 lb 1.6 oz (49.941 kg)  GENERAL:alert, with moderate distress from pain. SKIN: skin color, texture, turgor are normal, no rashes or significant lesions EYES: normal, Conjunctiva are pink and non-injected, sclera clear OROPHARYNX:no exudate, no erythema and lips,  buccal mucosa, and tongue normal  NECK: Limited examination due to presence of c-collar. LYMPH:  no palpable lymphadenopathy in the cervical, axillary or inguinal LUNGS: clear to auscultation and percussion with normal breathing effort HEART: regular rate & rhythm and no murmurs and no lower extremity edema ABDOMEN:abdomen soft, non-tender and normal bowel sounds Musculoskeletal:no cyanosis of digits and no clubbing  NEURO: alert & oriented x 3 with fluent speech, no focal motor/sensory deficits  LABORATORY DATA:  I have reviewed the data as listed    Component Value Date/Time   NA 141 01/19/2014 1439   NA 138 01/10/2014 1654   K 4.1 01/19/2014 1439   K 4.4 01/10/2014 1654   CL 100 01/10/2014 1654   CL 107 07/24/2012 0819   CO2 30* 01/19/2014 1439   CO2 25 01/10/2014 1646   GLUCOSE 89 01/19/2014 1439   GLUCOSE 239* 01/10/2014 1654   GLUCOSE 83 07/24/2012 0819   BUN 11.3 01/19/2014 1439   BUN 5* 01/10/2014 1654   CREATININE 1.0 01/19/2014 1439   CREATININE 0.90 01/10/2014 1654   CALCIUM 9.4 01/19/2014 1439   CALCIUM 8.8 01/10/2014 1646   PROT 7.1 01/19/2014 1439   PROT 6.9 01/10/2014 1646   ALBUMIN 3.8 01/19/2014 1439   ALBUMIN 3.9 01/10/2014 1646   AST 19 01/19/2014 1439   AST 68* 01/10/2014 1646   ALT 21 01/19/2014 1439   ALT 39 01/10/2014 1646   ALKPHOS 72 01/19/2014 1439   ALKPHOS 68 01/10/2014 1646   BILITOT 0.46 01/19/2014 1439   BILITOT 0.7 01/10/2014 1646   GFRNONAA >90 01/10/2014 1646   GFRAA >90 01/10/2014 1646    No results found for: SPEP, UPEP  Lab Results  Component Value Date   WBC 5.0 01/19/2014   NEUTROABS 3.7 01/19/2014   HGB 14.4 01/19/2014   HCT 43.6 01/19/2014   MCV 90.7 01/19/2014   PLT 277 01/19/2014      Chemistry      Component Value Date/Time   NA 141 01/19/2014 1439   NA 138 01/10/2014 1654   K 4.1 01/19/2014 1439   K 4.4 01/10/2014 1654   CL 100 01/10/2014 1654   CL 107 07/24/2012 0819   CO2 30* 01/19/2014 1439   CO2 25  01/10/2014 1646   BUN 11.3 01/19/2014 1439   BUN 5* 01/10/2014 1654   CREATININE 1.0 01/19/2014 1439   CREATININE 0.90 01/10/2014 1654      Component Value Date/Time   CALCIUM 9.4 01/19/2014 1439   CALCIUM 8.8 01/10/2014 1646   ALKPHOS 72 01/19/2014 1439   ALKPHOS 68 01/10/2014 1646   AST 19 01/19/2014 1439   AST 68* 01/10/2014 1646   ALT 21 01/19/2014 1439   ALT 39 01/10/2014 1646   BILITOT 0.46 01/19/2014 1439   BILITOT 0.7 01/10/2014 1646       RADIOGRAPHIC STUDIES: I reviewed the imaging study and agree with the interpretation I have personally reviewed the radiological images as listed and agreed with the findings in the report.  ASSESSMENT & PLAN:  Diffuse large B cell lymphoma His most recent CT show no evidence of disease. I will see him back in 6 months with history, physical examination and blood work.  Compression fracture This is due to recent fall. MRI is pending. He has neurosurgery appointment pending.  The patient wanted a second opinion at [facility]. He requested a referral to see another one if he is not satisfied with the neurosurgery appointment tomorrow. He requested pain medication as he ran out. We discussed about narcotic refill policy. I will give him 1 time prescription to last until his next pain clinic appointment, which will be in 02/02/2014, according to his patient. The patient is aware that I am not responsible if he gets fired from his pain clinic for getting prescription refill from me. I will not manage his chronic pain management.   Orders Placed This Encounter  Procedures  . CBC with Differential    Standing Status: Future     Number of Occurrences:      Standing Expiration Date: 02/23/2015  . Comprehensive metabolic panel    Standing Status: Future     Number of Occurrences:      Standing Expiration Date: 02/23/2015  . Lactate dehydrogenase    Standing Status: Future     Number of Occurrences:      Standing Expiration Date: 02/23/2015   . Ambulatory referral to Neurosurgery    Referral Priority:  Routine    Referral Type:  Surgical    Referral Reason:  Specialty Services Required    Requested Specialty:  Neurosurgery    Number of Visits Requested:  1   All questions were answered. The patient knows to call the clinic with any problems, questions or concerns. No barriers to learning was detected. I spent 30 minutes counseling the patient face to face. The total time spent in the appointment was 40 minutes and more than 50% was on counseling and review of test results     [facility], [place], MD 01/19/2014 3:34 PM

## 2014-01-19 NOTE — Telephone Encounter (Signed)
Pt confirmed labs/ov per 12/15 POF, gave pt AVS.... KJ, will call referral for pt to schedule for Neurologist in [place].Marland KitchenMarland Kitchen

## 2014-01-19 NOTE — Assessment & Plan Note (Signed)
His most recent CT show no evidence of disease. I will see him back in 6 months with history, physical examination and blood work.

## 2014-01-20 ENCOUNTER — Telehealth: Payer: Self-pay

## 2014-05-11 ENCOUNTER — Ambulatory Visit (HOSPITAL_COMMUNITY): Payer: Medicaid Other | Attending: Orthopedic Surgery | Admitting: Physical Therapy

## 2014-05-11 DIAGNOSIS — M5441 Lumbago with sciatica, right side: Secondary | ICD-10-CM | POA: Diagnosis not present

## 2014-05-11 DIAGNOSIS — M6281 Muscle weakness (generalized): Secondary | ICD-10-CM | POA: Insufficient documentation

## 2014-05-11 DIAGNOSIS — M542 Cervicalgia: Secondary | ICD-10-CM | POA: Insufficient documentation

## 2014-05-11 DIAGNOSIS — R200 Anesthesia of skin: Secondary | ICD-10-CM | POA: Insufficient documentation

## 2014-05-11 DIAGNOSIS — M5442 Lumbago with sciatica, left side: Secondary | ICD-10-CM | POA: Diagnosis not present

## 2014-05-11 DIAGNOSIS — M25659 Stiffness of unspecified hip, not elsewhere classified: Secondary | ICD-10-CM | POA: Insufficient documentation

## 2014-05-11 NOTE — Therapy (Signed)
[place], [place], [address] Phone: [phone]   Fax:  [phone]  Physical Therapy Evaluation  Patient Details  Name: Nicholas King MRN: [id number] Date of Birth: Jun 25, 1970 Referring Provider:  Renette Butters, MD  Encounter Date: 05/11/2014      PT End of Session - 05/11/14 1238    Visit Number 1   Number of Visits 16   Authorization Type [employer] [place]   Authorization - Visit Number 1   Authorization - Number of Visits 16   PT Start Time 0830   PT Stop Time 0930   PT Time Calculation (min) 60 min   Activity Tolerance Patient tolerated treatment well   Behavior During Therapy [facility] for tasks assessed/performed      Past Medical History  Diagnosis Date  . GERD (gastroesophageal reflux disease)   . Cancer   . Lymphoma   . Dysphagia, unspecified(787.20) 05/16/2012  . Pneumonia     April 2014  . 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 Neuropathic pain 11/06/2012  . Radiation 10/30/12-11/25/12    Bilateral Neck/Tonsil  . Neuropathy 12/26/2012  . Bone pain 12/26/2012  . Lumbago 01/06/2013  . Radiation 10/30/12-11/25/12    Bilateral neck and tonsils    Past Surgical History  Procedure Laterality Date  . No past surgeries    . Direct laryngoscopy Left 05/08/2012    Procedure: DIRECT LARYNGOSCOPY with biopsy left tonsil;  Surgeon: Melissa Montane, MD;  Location: [facility] OR;  Service: ENT;  Laterality: Left;  . Right eyebrown bone      Right frontal skull fracture    There were no vitals filed for this visit.  Visit Diagnosis:  Cervical spine pain  Bilateral low back pain with sciatica, sciatica laterality unspecified  Hip stiffness, unspecified laterality  Muscle weakness of left upper extremity      Subjective Assessment - 05/11/14 0845    Subjective neck pain rwith radiating symptoms ito Lt arm resulting in numbness and weakness. Low back pain with radiating numbness into bilateral  anterior thighs.    Pertinent History Patient fell off roof in 2015 and fractured his neck and back. Patient has since had numbness and weakness in Lt hand, patient is left handed for writing. Patient previously had cancer which is now in remission. still has trouble swallowing.  Patient has had ankle, knee and hip pain ever since chemo for cancer   How long can you sit comfortably? with prolonged sitting   How long can you stand comfortably? ,54minutes   Diagnostic tests xrays: found fractures in cervical spine, and lumbar spine.    Patient Stated Goals to decrease pain in back and neck and icrease Lt UE sensation and strength.    Currently in Pain? Yes   Pain Score 6    Pain Location Back   Pain Orientation Lower;Medial;Lateral;Posterior   Pain Descriptors / Indicators Aching;Stabbing   Pain Type Chronic pain   Pain Onset More than a month ago   Pain Frequency Intermittent   Aggravating Factors  too much activitiy or staying in the sameposition too long.    Pain Relieving Factors medication and walking and moving more but not too much   Pain Score 7   Pain Location Neck   Pain Orientation Left   Pain Descriptors / Indicators Tightness;Numbness;Aching   Pain Type Chronic pain   Pain Onset More than a month ago   Pain Frequency Intermittent   Aggravating Factors  Turning  head to left and right.   Pain Relieving Factors "hard to say"            [facility] PT Assessment - 05/11/14 0001    Assessment   Medical Diagnosis Neck and back pain s/p fall off a ladder. C7 facet fracture, Lt1 verteebral body fracture.    Onset Date 01/11/14   Next MD Visit Bonna Gains   Prior Therapy no   Restrictions   Other Position/Activity Restrictions Patient was given braqces immediately after fall for 8 weks but is no longer supposed to wear them.    Balance Screen   Has the patient fallen in the past 6 months Yes   How many times? 1  fall aoff ladder   Has the patient had a decrease in activity  level because of a fear of falling?  No   Is the patient reluctant to leave their home because of a fear of falling?  No   Prior Function   Level of Independence Independent with basic ADLs   Vocation On disability   Observation/Other Assessments   Focus on Therapeutic Outcomes (FOTO)  to be perfomred next session.    Functional Tests   Functional tests Other;Other2   Posture/Postural Control   Posture Comments limited lumbar lordosis and limited throacic kypphosis, Lt innominate elevated compared to t and Lt shoulder elevated compared to Left.  Left rotated Rt side bent. Left shoulder lower than Rt   AROM   Overall AROM Comments limited subtalar joint mobility   AROM Assessment Site Cervical;Lumbar;Hip;Shoulder   Right/Left Shoulder Left;Right   Right Shoulder Flexion 140 Degrees   Right Shoulder ABduction 145 Degrees   Left Shoulder Flexion 126 Degrees   Left Shoulder ABduction 106 Degrees   Right/Left Hip Right;Left   Right Hip External Rotation  55   Right Hip Internal Rotation  12   Left Hip External Rotation  54   Left Hip Internal Rotation  16   Cervical Flexion pain on Left   Cervical Extension pain on right.    Cervical - Right Rotation 55   Cervical - Left Rotation 38, more painful   Lumbar Flexion 45 degrees and painful through entire arc peripheralizes pain   Lumbar Extension 20 degrees and painful at end range centralizez pain.    Lumbar - Right Side Bend 6" further top floor than down Left   Lumbar - Left Side Bend causes pain down Rt side   Lumbar - Right Rotation 50   Lumbar - Left Rotation 45, pmore painful   Strength   Strength Assessment Site Lumbar;Shoulder;Hip   Right/Left Shoulder Left;Right   Right/Left Hip Left;Right   Right Hip Flexion 4/5  painful   Right Hip External Rotation  55   Right Hip Internal Rotation  12   Left Hip Flexion 4/5   Left Hip External Rotation  54   Left Hip Internal Rotation  16          PT Education - 05/11/14 1828     Education provided Yes   Education Details Diagnosis, prognosis, and HEP: laying on prone   Person(s) Educated Patient   Methods Explanation;Demonstration   Comprehension Verbalized understanding;Returned demonstration          PT Short Term Goals - 05/11/14 1641    PT SHORT TERM GOAL #1   Title Patient will dmeonstrate increased cervical spine rotation to 55 degrees bilaterally to more easily look over left shoulder while driving   Baseline Lt 38  degrees, Rt 55 degrees   Time 4   Period Weeks   Status New   PT SHORT TERM GOAL #2   Title Patient will dmeonstrate increased Shoulder flexion of 150 degrees bilaterally   Baseline Rt 140, Lt 126   Time 4   Period Weeks   Status New   PT SHORT TERM GOAL #3   Title Patient will display increased lumbar extension to 40 degrees   Baseline 20 degrees lumbar extension   Time 4   Period Weeks   Status New   PT SHORT TERM GOAL #4   Title Patient will display increased hip internal rotation to 25 degrees   Baseline Rt 12 degrees, Lt 16 degrees.    PT SHORT TERM GOAL #5   Title I with HEP.    Time 4   Period Weeks   Status New           PT Long Term Goals - 05/11/14 1647    PT LONG TERM GOAL #1   Title Patient will dmeonstrate increased cervical spine rotation to 70 degrees bilaterally to more easily look over left shoulder while driving   Time 8   Period Weeks   Status New   PT LONG TERM GOAL #2   Title Patient will dmeonstrate increased Shoulder flexion of 170 degrees bilaterally   Time 8   Period Weeks   Status New   PT LONG TERM GOAL #3   Title Patient will dmeonstrate increased lumbar flexion to 90 degrees and MMT strength of 5/5 indicating improved trunk stability to maintain improved posture.    Time 8   Period Weeks   Status New   PT LONG TERM GOAL #4   Title Patient will display increased hip internal rotation to >35 degrees   Time 8   Period Weeks   Status New   PT LONG TERM GOAL #5   Title Patient wll  be able to open a jar of jelly to enjoy flavorful sandwitches indicating improved grip strength on Lt UE   Time 8   Period Weeks   Status New               Plan - 05/11/14 1634    Clinical Impression Statement Patient displasy neck and back pain with radiatign symptoms into Lt UE and bilateral LE attributed to siffness in his cervical, thoracic, and lumbar spine as well as stiffness in bilateral hips and Lt shoulder s/p fall from a ladder resulting in a C7 facet fracture and L1 vertebral body fracture per imaging report. Patient will benefit from skille dphysical therapy to increase bilateral hip internal rotation to improve gait mechanicns and increase lumbar spine extension to decrease strain on low back so patient can return to walking without pain and increase patient's cervical, thoracic and shoulder ROM to decrease pain with UE reaching. Inittial focus to be on increaseing bbilateral hip internal  rotationg improving lumbar spine and neck pain by improving posture by increasing lumbar lordosis with a secondary ocus o increasing cervical spine ROM.    Pt will benefit from skilled therapeutic intervention in order to improve on the following deficits Abnormal gait;Decreased endurance;Increased muscle spasms;Improper body mechanics;Decreased strength;Impaired flexibility;Postural dysfunction;Pain;Difficulty walking;Decreased balance;Decreased range of motion;Impaired UE functional use   Rehab Potential Good   PT Frequency 2x / week   PT Duration 8 weeks   PT Treatment/Interventions ADLs/Self Care Home Management;Stair training;Patient/family education;Passive range of motion;Functional mobility training;Therapeutic exercise;Manual techniques;Balance training   PT Next Visit  Plan progress to prone on elbows, introduce thoracic spine excursions, introduce cervical spine manual therapy, introduce hip extension and pirifoprmis stretches.    PT Home Exercise Plan laying prone 1 to 56minutes 2x  daily with iincremental increases of 1 minute per bout per day.    Consulted and Agree with Plan of Care Patient          G-Codes - May 18, 2014 1829    Functional Assessment Tool Used Per subjective  and strength deficits   Functional Limitation Changing and maintaining body position   Changing and Maintaining Body Position Current Status [phone]) At least 40 percent but less than 60 percent impaired, limited or restricted   Changing and Maintaining Body Position Goal Status (G8719) At least 20 percent but less than 40 percent impaired, limited or restricted       Problem List Patient Active Problem List   Diagnosis Date Noted  . Compression fracture 01/19/2014  . Dysphagia 07/20/2013  . Poor dentition 07/20/2013  . Meralgia paraesthetica 01/06/2013  . Lumbago 01/06/2013  . Neuropathy 12/26/2012  . Bone pain 12/26/2012  . Neuropathic pain 11/06/2012  . Cachexia 05/16/2012  . Diffuse large B cell lymphoma 05/16/2012  . Dysphagia, unspecified 05/16/2012  . GERD (gastroesophageal reflux disease) 05/16/2012  . Thyroid nodule 05/16/2012  . Cigarette smoker 05/16/2012   Devona Konig PT DPT [place], [place], [address] Phone: [phone]   Fax:  [phone]

## 2014-05-18 ENCOUNTER — Ambulatory Visit (HOSPITAL_COMMUNITY): Payer: Medicaid Other

## 2014-05-18 DIAGNOSIS — M6281 Muscle weakness (generalized): Secondary | ICD-10-CM

## 2014-05-18 DIAGNOSIS — M25659 Stiffness of unspecified hip, not elsewhere classified: Secondary | ICD-10-CM

## 2014-05-18 DIAGNOSIS — M5442 Lumbago with sciatica, left side: Secondary | ICD-10-CM

## 2014-05-18 DIAGNOSIS — M542 Cervicalgia: Secondary | ICD-10-CM | POA: Diagnosis not present

## 2014-05-18 DIAGNOSIS — M5441 Lumbago with sciatica, right side: Secondary | ICD-10-CM

## 2014-05-18 NOTE — Patient Instructions (Signed)
Hip Extension   Lie on back, legs in air, knees bent. Grasp hands behind one thigh and cross other leg over same thigh. Hold 30  seconds. Repeat with other leg held. Repeat 3 times. Do 1-2 sessions per day.  Copyright  VHI. All rights reserved.

## 2014-05-18 NOTE — Therapy (Addendum)
[place], [place], [address] Phone: [phone]   Fax:  [phone]  Physical Therapy Treatment  Patient Details  Name: Nicholas King MRN: [id number] Date of Birth: 1970-10-27 Referring Provider:  Clydell Hakim, MD  Encounter Date: 05/18/2014      PT End of Session - 05/18/14 1737    Visit Number 2   Number of Visits 16   Date for PT Re-Evaluation 06/08/14   Authorization Type [employer] [place]   Authorization Time Period 05/11/2014-07/11/2014   Authorization - Visit Number 2   Authorization - Number of Visits 16   PT Start Time 9470   PT Stop Time 1735   PT Time Calculation (min) 45 min   Activity Tolerance Patient tolerated treatment well   Behavior During Therapy [facility] for tasks assessed/performed      Past Medical History  Diagnosis Date  . GERD (gastroesophageal reflux disease)   . Cancer   . Lymphoma   . Dysphagia, unspecified(787.20) 05/16/2012  . Pneumonia     April 2014  . 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 Neuropathic pain 11/06/2012  . Radiation 10/30/12-11/25/12    Bilateral Neck/Tonsil  . Neuropathy 12/26/2012  . Bone pain 12/26/2012  . Lumbago 01/06/2013  . Radiation 10/30/12-11/25/12    Bilateral neck and tonsils    Past Surgical History  Procedure Laterality Date  . No past surgeries    . Direct laryngoscopy Left 05/08/2012    Procedure: DIRECT LARYNGOSCOPY with biopsy left tonsil;  Surgeon: Melissa Montane, MD;  Location: [facility] OR;  Service: ENT;  Laterality: Left;  . Right eyebrown bone      Right frontal skull fracture    There were no vitals filed for this visit.  Visit Diagnosis:  Cervical spine pain  Bilateral low back pain with sciatica, sciatica laterality unspecified  Hip stiffness, unspecified laterality  Muscle weakness of left upper extremity      Subjective Assessment - 05/18/14 1653    Subjective Pt stated posterior neck pain scale 6/10, pt stated he is  premedicated today prior to session.  Reports compliance with HEP without question   Currently in Pain? Yes   Pain Score 6    Pain Location Back   Pain Orientation Posterior   Pain Descriptors / Indicators Aching;Sore;Tightness             [facility] Adult PT Treatment/Exercise - 05/18/14 0001    Exercises   Exercises Neck   Neck Exercises: Standing   Other Standing Exercises Cervical spinal matrix with dowel rod    Neck Exercises: Seated   Other Seated Exercise 3D cervical and thoracic excursion with UE Movements   Neck Exercises: Supine   Other Supine Exercise Bridge 10x 2"   Other Supine Exercise Piriformis 3x 30"   Neck Exercises: Prone   Other Prone Exercise POE x 2 minutes   Other Prone Exercise hip extension 2x 5                PT Education - 05/18/14 1750    Education provided Yes   Education Details Educated on piriformis location and sciatic like symptoms.   Person(s) Educated Patient   Methods Explanation;Demonstration;Handout   Comprehension Verbalized understanding;Returned demonstration          PT Short Term Goals - 05/18/14 1749    PT SHORT TERM GOAL #1   Title Patient will dmeonstrate increased cervical spine rotation to 55 degrees bilaterally to more easily look  over left shoulder while driving   Status On-going   PT SHORT TERM GOAL #2   Title Patient will dmeonstrate increased Shoulder flexion of 150 degrees bilaterally   Status On-going   PT SHORT TERM GOAL #3   Title Patient will display increased lumbar extension to 40 degrees   Status On-going   PT SHORT TERM GOAL #4   Title Patient will display increased hip internal rotation to 25 degrees   Status On-going   PT SHORT TERM GOAL #5   Title I with HEP.    Status On-going           PT Long Term Goals - 05/11/14 1647    PT LONG TERM GOAL #1   Title Patient will dmeonstrate increased cervical spine rotation to 70 degrees bilaterally to more easily look over left shoulder while  driving   Time 8   Period Weeks   Status New   PT LONG TERM GOAL #2   Title Patient will dmeonstrate increased Shoulder flexion of 170 degrees bilaterally   Time 8   Period Weeks   Status New   PT LONG TERM GOAL #3   Title Patient will dmeonstrate increased lumbar flexion to 90 degrees and MMT strength of 5/5 indicating improved trunk stability to maintain improved posture.    Time 8   Period Weeks   Status New   PT LONG TERM GOAL #4   Title Patient will display increased hip internal rotation to >35 degrees   Time 8   Period Weeks   Status New   PT LONG TERM GOAL #5   Title Patient wll be able to open a jar of jelly to enjoy flavorful sandwitches indicating improved grip strength on Lt UE   Time 8   Period Weeks   Status New               Plan - 05/18/14 1740    Clinical Impression Statement Began PT POC reviewing goals and session focus on improving cervical, thoracic and lumbar mobilty with exercusions and stretches.  Therapist facilitation to complete exercises correctly with proper form and techniuqe.  Pt educated on sciatic like symptoms and educated on benefits of completeing piriformis stretches.  Pt given piriformis stretches as an HEP.  Pt reported decrease in pain and stiffness at end of session.   PT Next Visit Plan Continue cervical and thoracic excursion, cervical spinal matrix for UE ROM, continue piriformis stretches and introduce 3D hip excrsion, hip flexion stretch and give putty with HEP to improve grip strength.          Problem List Patient Active Problem List   Diagnosis Date Noted  . Compression fracture 01/19/2014  . Dysphagia 07/20/2013  . Poor dentition 07/20/2013  . Meralgia paraesthetica 01/06/2013  . Lumbago 01/06/2013  . Neuropathy 12/26/2012  . Bone pain 12/26/2012  . Neuropathic pain 11/06/2012  . Cachexia 05/16/2012  . Diffuse large B cell lymphoma 05/16/2012  . Dysphagia, unspecified 05/16/2012  . GERD (gastroesophageal reflux  disease) 05/16/2012  . Thyroid nodule 05/16/2012  . Cigarette smoker 05/16/2012   Ihor Austin, [place]; [place] [address] [phone]  Aldona Lento 05/18/2014, 5:51 PM  [place], [place], [address] Phone: [phone]   Fax:  [phone]     PHYSICAL THERAPY DISCHARGE SUMMARY  Visits from Start of Care: 2  Current functional level related to goals / functional outcomes: Unknown pt did not return  Remaining deficits: unknown   Education / Equipment: HEP  Plan: Patient agrees to discharge.  Patient goals were not met. Patient is being discharged due to not returning since the last visit.  ?????       Rayetta Humphrey, [place] CLT [phone]

## 2014-05-26 ENCOUNTER — Ambulatory Visit (HOSPITAL_COMMUNITY): Payer: Medicaid Other | Admitting: Physical Therapy

## 2014-06-02 ENCOUNTER — Telehealth (HOSPITAL_COMMUNITY): Payer: Self-pay

## 2014-06-02 ENCOUNTER — Ambulatory Visit (HOSPITAL_COMMUNITY): Payer: Medicaid Other

## 2014-06-02 NOTE — Telephone Encounter (Signed)
No Show Called and left message on home phone number indicating our contact info and next apt time and date.  [address], [place]; [place] [address] [phone]

## 2014-06-04 ENCOUNTER — Telehealth (HOSPITAL_COMMUNITY): Payer: Self-pay

## 2014-06-04 ENCOUNTER — Ambulatory Visit (HOSPITAL_COMMUNITY): Payer: Medicaid Other

## 2014-06-04 NOTE — Telephone Encounter (Signed)
3rd No show.  Call complete Pt stated MRI complete to neck, MD Maryjean Ka stated pinched nerve and awaiting apt. with surgeon.  D/C apt.s   Ihor Austin, [place]; [place] [address] [phone]

## 2014-06-08 ENCOUNTER — Encounter (HOSPITAL_COMMUNITY): Payer: Medicare Other | Admitting: Physical Therapy

## 2014-06-10 ENCOUNTER — Encounter (HOSPITAL_COMMUNITY): Payer: Medicare Other | Admitting: Physical Therapy

## 2014-06-15 ENCOUNTER — Encounter (HOSPITAL_COMMUNITY): Payer: Medicare Other

## 2014-06-17 ENCOUNTER — Encounter (HOSPITAL_COMMUNITY): Payer: Medicare Other | Admitting: Physical Therapy

## 2014-06-22 ENCOUNTER — Encounter (HOSPITAL_COMMUNITY): Payer: Medicare Other | Admitting: Physical Therapy

## 2014-07-15 DIAGNOSIS — S129XXD Fracture of neck, unspecified, subsequent encounter: Secondary | ICD-10-CM | POA: Diagnosis not present

## 2014-07-15 DIAGNOSIS — S32000D Wedge compression fracture of unspecified lumbar vertebra, subsequent encounter for fracture with routine healing: Secondary | ICD-10-CM | POA: Diagnosis not present

## 2014-07-15 DIAGNOSIS — G540 Brachial plexus disorders: Secondary | ICD-10-CM | POA: Diagnosis not present

## 2014-07-15 DIAGNOSIS — M542 Cervicalgia: Secondary | ICD-10-CM | POA: Diagnosis not present

## 2014-07-15 DIAGNOSIS — M5416 Radiculopathy, lumbar region: Secondary | ICD-10-CM | POA: Diagnosis not present

## 2014-07-15 DIAGNOSIS — G620 Drug-induced polyneuropathy: Secondary | ICD-10-CM | POA: Diagnosis not present

## 2014-07-15 DIAGNOSIS — G5622 Lesion of ulnar nerve, left upper limb: Secondary | ICD-10-CM | POA: Diagnosis not present

## 2014-07-15 DIAGNOSIS — R030 Elevated blood-pressure reading, without diagnosis of hypertension: Secondary | ICD-10-CM | POA: Diagnosis not present

## 2014-07-15 DIAGNOSIS — T451X5A Adverse effect of antineoplastic and immunosuppressive drugs, initial encounter: Secondary | ICD-10-CM | POA: Diagnosis not present

## 2014-07-19 ENCOUNTER — Telehealth: Payer: Self-pay | Admitting: Hematology and Oncology

## 2014-07-19 NOTE — Telephone Encounter (Signed)
pt called to confirm appt....pt ok and aware °

## 2014-07-20 ENCOUNTER — Telehealth: Payer: Self-pay | Admitting: Hematology and Oncology

## 2014-07-20 ENCOUNTER — Encounter: Payer: Self-pay | Admitting: Hematology and Oncology

## 2014-07-20 ENCOUNTER — Other Ambulatory Visit (HOSPITAL_BASED_OUTPATIENT_CLINIC_OR_DEPARTMENT_OTHER): Payer: Medicare Other

## 2014-07-20 ENCOUNTER — Ambulatory Visit (HOSPITAL_BASED_OUTPATIENT_CLINIC_OR_DEPARTMENT_OTHER): Payer: Medicare Other | Admitting: Hematology and Oncology

## 2014-07-20 VITALS — BP 133/95 | HR 90 | Temp 100.8°F | Resp 18 | Ht 68.0 in | Wt 107.3 lb

## 2014-07-20 DIAGNOSIS — Z720 Tobacco use: Secondary | ICD-10-CM

## 2014-07-20 DIAGNOSIS — IMO0002 Reserved for concepts with insufficient information to code with codable children: Secondary | ICD-10-CM

## 2014-07-20 DIAGNOSIS — C8330 Diffuse large B-cell lymphoma, unspecified site: Secondary | ICD-10-CM

## 2014-07-20 DIAGNOSIS — D696 Thrombocytopenia, unspecified: Secondary | ICD-10-CM | POA: Diagnosis not present

## 2014-07-20 DIAGNOSIS — R64 Cachexia: Secondary | ICD-10-CM | POA: Diagnosis not present

## 2014-07-20 DIAGNOSIS — D72819 Decreased white blood cell count, unspecified: Secondary | ICD-10-CM

## 2014-07-20 LAB — CBC WITH DIFFERENTIAL/PLATELET
BASO%: 0.6 % (ref 0.0–2.0)
Basophils Absolute: 0 10*3/uL (ref 0.0–0.1)
EOS%: 0.2 % (ref 0.0–7.0)
Eosinophils Absolute: 0 10*3/uL (ref 0.0–0.5)
HEMATOCRIT: 40.4 % (ref 38.4–49.9)
HGB: 13.6 g/dL (ref 13.0–17.1)
LYMPH%: 16.6 % (ref 14.0–49.0)
MCH: 30.2 pg (ref 27.2–33.4)
MCHC: 33.6 g/dL (ref 32.0–36.0)
MCV: 90 fL (ref 79.3–98.0)
MONO#: 0.2 10*3/uL (ref 0.1–0.9)
MONO%: 8.2 % (ref 0.0–14.0)
NEUT#: 2 10*3/uL (ref 1.5–6.5)
NEUT%: 74.4 % (ref 39.0–75.0)
Platelets: 136 10*3/uL — ABNORMAL LOW (ref 140–400)
RBC: 4.49 10*6/uL (ref 4.20–5.82)
RDW: 13.9 % (ref 11.0–14.6)
WBC: 2.7 10*3/uL — AB (ref 4.0–10.3)
lymph#: 0.5 10*3/uL — ABNORMAL LOW (ref 0.9–3.3)

## 2014-07-20 LAB — COMPREHENSIVE METABOLIC PANEL (CC13)
ALK PHOS: 54 U/L (ref 40–150)
ALT: 12 U/L (ref 0–55)
AST: 18 U/L (ref 5–34)
Albumin: 3.7 g/dL (ref 3.5–5.0)
Anion Gap: 12 mEq/L — ABNORMAL HIGH (ref 3–11)
BUN: 12.4 mg/dL (ref 7.0–26.0)
CO2: 25 meq/L (ref 22–29)
Calcium: 8.5 mg/dL (ref 8.4–10.4)
Chloride: 103 mEq/L (ref 98–109)
Creatinine: 0.8 mg/dL (ref 0.7–1.3)
GLUCOSE: 104 mg/dL (ref 70–140)
Potassium: 4 mEq/L (ref 3.5–5.1)
SODIUM: 139 meq/L (ref 136–145)
TOTAL PROTEIN: 6.2 g/dL — AB (ref 6.4–8.3)
Total Bilirubin: 0.82 mg/dL (ref 0.20–1.20)

## 2014-07-20 LAB — LACTATE DEHYDROGENASE (CC13): LDH: 168 U/L (ref 125–245)

## 2014-07-20 NOTE — Telephone Encounter (Signed)
Pt confirmed labs/ov per 06/14 POF, gave pt AVS and Calendar.... KJ, gave pt barium

## 2014-07-20 NOTE — Progress Notes (Signed)
[facility] OFFICE PROGRESS NOTE  Patient Care Team: Arne Cleveland, MD as PCP - General (Radiology) Melissa Montane, MD as Attending Physician (Otolaryngology) Michel Bickers, MD (Infectious Diseases) Leota Sauers, RN as Registered Nurse (Oncology) Heath Lark, MD as Consulting Physician (Hematology and Oncology)  SUMMARY OF ONCOLOGIC HISTORY: DIAGNOSIS: stage II DLBCL (mediastinal nodes on PET scan was most likely due to past lung abscess).   SUMMARY OF ONCOLOGIC HISTORY: #08 March 2012 He developed left side sore throat; progressive; 2 months. Hoarse voice. Dysphagia with solid (better with liquid). 40 lb weight loss 3 weeks. #2 CT soft tissue the neck on 04/17/2012 showed extensive soft tissue swelling of the left tonsil without abscess. There were shotty lymph nodes in the neck bilaterally.  #3 Biopsy by Dr. Janace Hoard on 05/08/2012 showed large B-cell lymphoma in the posterior pharynx, and in the right posterior pharyngeal wall.  #4 He was started on chemo R-CHOP on 06/09/2012 #5 10/03/2012: He completed 6 cycles of chemo along with 4 rounds of intrathecal chemotherapy #6 10/24/2012: Restaging PET scan showed complete response #7 10/30/12: Patient was started on consolidation RT #8 01/10/2014, he had accidental fall with compression fracture. CT scan of the chest, abdomen and pelvis show no evidence of recurrence of lymphoma  INTERVAL HISTORY: Please see below for problem oriented charting. The patient has lost 3 pounds away. He continues to smoke. He continues to have 10 out of 10 pain in his neck. He is being managed by neurosurgery and pain clinic. Denies new lymphadenopathy. Denies recent infection.  REVIEW OF SYSTEMS:   Constitutional: Denies fevers, chills  Eyes: Denies blurriness of vision Ears, nose, mouth, throat, and face: Denies mucositis or sore throat Respiratory: Denies cough, dyspnea or wheezes Cardiovascular: Denies palpitation, chest discomfort or lower  extremity swelling Gastrointestinal:  Denies nausea, heartburn or change in bowel habits Skin: Denies abnormal skin rashes Lymphatics: Denies new lymphadenopathy or easy bruising Neurological:Denies numbness, tingling or new weaknesses Behavioral/Psych: Mood is stable, no new changes  All other systems were reviewed with the patient and are negative.  I have reviewed the past medical history, past surgical history, social history and family history with the patient and they are unchanged from previous note.  ALLERGIES:  is allergic to vancomycin; cymbalta; penicillins; amoxicillin; and hydrocodone.  MEDICATIONS:  Current Outpatient Prescriptions  Medication Sig Dispense Refill  . Cholecalciferol (VITAMIN D-3) 5000 UNITS TABS Take 1 tablet by mouth daily.    Marland Kitchen gabapentin (NEURONTIN) 600 MG tablet Take 600 mg by mouth 4 (four) times daily.    Marland Kitchen ibuprofen (ADVIL,MOTRIN) 200 MG tablet Take 400 mg by mouth every 6 (six) hours as needed for mild pain or moderate pain.    Marland Kitchen levETIRAcetam (KEPPRA) 250 MG tablet Take 250 mg by mouth 2 (two) times daily.    . meloxicam (MOBIC) 15 MG tablet Take 15 mg by mouth daily.    Marland Kitchen oxyCODONE 10 MG TABS Take 1 tablet (10 mg total) by mouth 3 (three) times daily as needed for severe pain. 42 tablet 0   No current facility-administered medications for this visit.    PHYSICAL EXAMINATION: ECOG PERFORMANCE STATUS: 1 - Symptomatic but completely ambulatory  Filed Vitals:   07/20/14 1444  BP: 133/95  Pulse: 90  Temp: 100.8 F (38.2 C)  Resp: 18   Filed Weights   07/20/14 1444  Weight: 107 lb 4.8 oz (48.671 kg)    GENERAL:alert, no distress and comfortable. He looks thin and cachectic SKIN: skin  color, texture, turgor are normal, no rashes or significant lesions EYES: normal, Conjunctiva are pink and non-injected, sclera clear OROPHARYNX:no exudate, no erythema and lips, buccal mucosa, and tongue normal . Poor dentition is noted NECK: supple, thyroid  normal size, non-tender, without nodularity LYMPH:  no palpable lymphadenopathy in the cervical, axillary or inguinal LUNGS: clear to auscultation and percussion with normal breathing effort HEART: regular rate & rhythm and no murmurs and no lower extremity edema ABDOMEN:abdomen soft, non-tender and normal bowel sounds. No splenomegaly Musculoskeletal:no cyanosis of digits and no clubbing  NEURO: alert & oriented x 3 with fluent speech, no focal motor/sensory deficits  LABORATORY DATA:  I have reviewed the data as listed    Component Value Date/Time   NA 141 01/19/2014 1439   NA 138 01/10/2014 1654   K 4.1 01/19/2014 1439   K 4.4 01/10/2014 1654   CL 100 01/10/2014 1654   CL 107 07/24/2012 0819   CO2 30* 01/19/2014 1439   CO2 25 01/10/2014 1646   GLUCOSE 89 01/19/2014 1439   GLUCOSE 239* 01/10/2014 1654   GLUCOSE 83 07/24/2012 0819   BUN 11.3 01/19/2014 1439   BUN 5* 01/10/2014 1654   CREATININE 1.0 01/19/2014 1439   CREATININE 0.90 01/10/2014 1654   CALCIUM 9.4 01/19/2014 1439   CALCIUM 8.8 01/10/2014 1646   PROT 7.1 01/19/2014 1439   PROT 6.9 01/10/2014 1646   ALBUMIN 3.8 01/19/2014 1439   ALBUMIN 3.9 01/10/2014 1646   AST 19 01/19/2014 1439   AST 68* 01/10/2014 1646   ALT 21 01/19/2014 1439   ALT 39 01/10/2014 1646   ALKPHOS 72 01/19/2014 1439   ALKPHOS 68 01/10/2014 1646   BILITOT 0.46 01/19/2014 1439   BILITOT 0.7 01/10/2014 1646   GFRNONAA >90 01/10/2014 1646   GFRAA >90 01/10/2014 1646    No results found for: SPEP, UPEP  Lab Results  Component Value Date   WBC 2.7* 07/20/2014   NEUTROABS 2.0 07/20/2014   HGB 13.6 07/20/2014   HCT 40.4 07/20/2014   MCV 90.0 07/20/2014   PLT 136* 07/20/2014      Chemistry      Component Value Date/Time   NA 141 01/19/2014 1439   NA 138 01/10/2014 1654   K 4.1 01/19/2014 1439   K 4.4 01/10/2014 1654   CL 100 01/10/2014 1654   CL 107 07/24/2012 0819   CO2 30* 01/19/2014 1439   CO2 25 01/10/2014 1646   BUN 11.3  01/19/2014 1439   BUN 5* 01/10/2014 1654   CREATININE 1.0 01/19/2014 1439   CREATININE 0.90 01/10/2014 1654      Component Value Date/Time   CALCIUM 9.4 01/19/2014 1439   CALCIUM 8.8 01/10/2014 1646   ALKPHOS 72 01/19/2014 1439   ALKPHOS 68 01/10/2014 1646   AST 19 01/19/2014 1439   AST 68* 01/10/2014 1646   ALT 21 01/19/2014 1439   ALT 39 01/10/2014 1646   BILITOT 0.46 01/19/2014 1439   BILITOT 0.7 01/10/2014 1646      ASSESSMENT & PLAN:  Diffuse large B cell lymphoma His most recent CT last year showed positive response to treatment. I do not like to hear that he has persistent anorexia and weight loss I will see him back in 6 months with history, physical examination and blood work, along with repeat imaging in 6 months.    Compression fracture He has seen neurosurgery and is taking high-dose vitamin D and rehabilitation. I will defer further management to his neurosurgeon  Leukopenia  This is mild. Cause unknown. I will observe for now. As above, will repeat imaging study in 6 months  Thrombocytopenia The cause is unknown. He is not symptomatic. I will observe. I will order imaging studies and repeat blood work in 6 months  Tobacco abuse I spent some time counseling the patient the importance of tobacco cessation. he is currently attempting to quit on his own  Cachexia He has lost 3 pounds in weight. Clinical exam and blood work is unremarkable. I will order repeat blood work imaging study and exam in 6 months for further follow-up. I encouraged patient to increase oral intake and dietary supplement as tolerated.   Orders Placed This Encounter  Procedures  . CT Chest W Contrast    Standing Status: Future     Number of Occurrences:      Standing Expiration Date: 10/20/2015    Order Specific Question:  Reason for Exam (SYMPTOM  OR DIAGNOSIS REQUIRED)    Answer:  staging lymphoma, weight loss, exclude recurrence    Order Specific Question:  Preferred  imaging location?    Answer:  [facility]  . CT Abdomen Pelvis W Contrast    Standing Status: Future     Number of Occurrences:      Standing Expiration Date: 10/20/2015    Order Specific Question:  Reason for Exam (SYMPTOM  OR DIAGNOSIS REQUIRED)    Answer:  staging lymphoma, weight loss, exclude recurrence    Order Specific Question:  Preferred imaging location?    Answer:  [facility]  . CBC with Differential/Platelet    Standing Status: Future     Number of Occurrences:      Standing Expiration Date: 10/20/2015  . Comprehensive metabolic panel    Standing Status: Future     Number of Occurrences:      Standing Expiration Date: 10/20/2015  . Lactate dehydrogenase    Standing Status: Future     Number of Occurrences:      Standing Expiration Date: 10/20/2015   All questions were answered. The patient knows to call the clinic with any problems, questions or concerns. No barriers to learning was detected. I spent 25 minutes counseling the patient face to face. The total time spent in the appointment was 30 minutes and more than 50% was on counseling and review of test results     [facility], [place], MD 07/20/2014 3:01 PM

## 2014-07-20 NOTE — Assessment & Plan Note (Signed)
He has seen neurosurgery and is taking high-dose vitamin D and rehabilitation. I will defer further management to his neurosurgeon

## 2014-07-20 NOTE — Assessment & Plan Note (Signed)
I spent some time counseling the patient the importance of tobacco cessation. he is currently attempting to quit on his own 

## 2014-07-20 NOTE — Assessment & Plan Note (Signed)
He has lost 3 pounds in weight. Clinical exam and blood work is unremarkable. I will order repeat blood work imaging study and exam in 6 months for further follow-up. I encouraged patient to increase oral intake and dietary supplement as tolerated.

## 2014-07-20 NOTE — Assessment & Plan Note (Signed)
His most recent CT last year showed positive response to treatment. I do not like to hear that he has persistent anorexia and weight loss I will see him back in 6 months with history, physical examination and blood work, along with repeat imaging in 6 months.

## 2014-07-20 NOTE — Assessment & Plan Note (Signed)
This is mild. Cause unknown. I will observe for now. As above, will repeat imaging study in 6 months

## 2014-07-20 NOTE — Assessment & Plan Note (Signed)
The cause is unknown. He is not symptomatic. I will observe. I will order imaging studies and repeat blood work in 6 months

## 2014-07-27 ENCOUNTER — Other Ambulatory Visit: Payer: Self-pay | Admitting: *Deleted

## 2014-07-27 ENCOUNTER — Telehealth: Payer: Self-pay | Admitting: *Deleted

## 2014-07-27 DIAGNOSIS — M792 Neuralgia and neuritis, unspecified: Secondary | ICD-10-CM

## 2014-07-27 DIAGNOSIS — IMO0002 Reserved for concepts with insufficient information to code with codable children: Secondary | ICD-10-CM

## 2014-07-27 DIAGNOSIS — M898X9 Other specified disorders of bone, unspecified site: Secondary | ICD-10-CM

## 2014-07-27 NOTE — Telephone Encounter (Signed)
Pls refer patient for second opinion, severe back pain

## 2014-07-27 NOTE — Progress Notes (Signed)
TC from patient requesting referral for 2nd neurosurgeon opinion. He would like to see Dr. Glenna Fellows in [place]. His office told patient he needed a referral. Phone # is [phone]. Fax # is [phone].   Referral request faxed along w/ most recent office note to Dr. Carloyn Manner at above fax #

## 2014-07-27 NOTE — Telephone Encounter (Signed)
TC from patient requesting referral for 2nd neurosurgeon opinion. He would like to see Dr. Glenna Fellows in [place]. His office told patient he needed a referral. Phone # is [phone]. Fax # is [phone]

## 2014-07-31 ENCOUNTER — Encounter (HOSPITAL_COMMUNITY): Payer: Self-pay | Admitting: Emergency Medicine

## 2014-07-31 ENCOUNTER — Emergency Department (HOSPITAL_COMMUNITY): Payer: Medicare Other

## 2014-07-31 ENCOUNTER — Inpatient Hospital Stay (HOSPITAL_COMMUNITY)
Admission: EM | Admit: 2014-07-31 | Discharge: 2014-08-05 | DRG: 871 | Disposition: A | Discharge status: Home / Self Care | Payer: Medicare Other | Attending: Internal Medicine | Admitting: Internal Medicine

## 2014-07-31 DIAGNOSIS — Z8701 Personal history of pneumonia (recurrent): Secondary | ICD-10-CM | POA: Diagnosis not present

## 2014-07-31 DIAGNOSIS — I959 Hypotension, unspecified: Secondary | ICD-10-CM

## 2014-07-31 DIAGNOSIS — Z801 Family history of malignant neoplasm of trachea, bronchus and lung: Secondary | ICD-10-CM

## 2014-07-31 DIAGNOSIS — Z888 Allergy status to other drugs, medicaments and biological substances status: Secondary | ICD-10-CM | POA: Diagnosis not present

## 2014-07-31 DIAGNOSIS — K219 Gastro-esophageal reflux disease without esophagitis: Secondary | ICD-10-CM | POA: Diagnosis present

## 2014-07-31 DIAGNOSIS — Z833 Family history of diabetes mellitus: Secondary | ICD-10-CM

## 2014-07-31 DIAGNOSIS — Z880 Allergy status to penicillin: Secondary | ICD-10-CM | POA: Diagnosis not present

## 2014-07-31 DIAGNOSIS — Z881 Allergy status to other antibiotic agents status: Secondary | ICD-10-CM

## 2014-07-31 DIAGNOSIS — D72819 Decreased white blood cell count, unspecified: Secondary | ICD-10-CM | POA: Diagnosis present

## 2014-07-31 DIAGNOSIS — R509 Fever, unspecified: Secondary | ICD-10-CM

## 2014-07-31 DIAGNOSIS — R6510 Systemic inflammatory response syndrome (SIRS) of non-infectious origin without acute organ dysfunction: Secondary | ICD-10-CM | POA: Diagnosis not present

## 2014-07-31 DIAGNOSIS — Z79891 Long term (current) use of opiate analgesic: Secondary | ICD-10-CM | POA: Diagnosis not present

## 2014-07-31 DIAGNOSIS — G8929 Other chronic pain: Secondary | ICD-10-CM | POA: Diagnosis present

## 2014-07-31 DIAGNOSIS — A4159 Other Gram-negative sepsis: Principal | ICD-10-CM | POA: Diagnosis present

## 2014-07-31 DIAGNOSIS — M545 Low back pain: Secondary | ICD-10-CM | POA: Diagnosis present

## 2014-07-31 DIAGNOSIS — A419 Sepsis, unspecified organism: Secondary | ICD-10-CM | POA: Diagnosis present

## 2014-07-31 DIAGNOSIS — Z720 Tobacco use: Secondary | ICD-10-CM | POA: Diagnosis not present

## 2014-07-31 DIAGNOSIS — D696 Thrombocytopenia, unspecified: Secondary | ICD-10-CM | POA: Diagnosis present

## 2014-07-31 DIAGNOSIS — C8330 Diffuse large B-cell lymphoma, unspecified site: Secondary | ICD-10-CM | POA: Diagnosis not present

## 2014-07-31 DIAGNOSIS — E43 Unspecified severe protein-calorie malnutrition: Secondary | ICD-10-CM | POA: Diagnosis present

## 2014-07-31 DIAGNOSIS — R161 Splenomegaly, not elsewhere classified: Secondary | ICD-10-CM | POA: Diagnosis not present

## 2014-07-31 DIAGNOSIS — Z800 Family history of malignant neoplasm of digestive organs: Secondary | ICD-10-CM | POA: Diagnosis not present

## 2014-07-31 DIAGNOSIS — F17210 Nicotine dependence, cigarettes, uncomplicated: Secondary | ICD-10-CM | POA: Diagnosis present

## 2014-07-31 DIAGNOSIS — Z79899 Other long term (current) drug therapy: Secondary | ICD-10-CM

## 2014-07-31 DIAGNOSIS — Z885 Allergy status to narcotic agent status: Secondary | ICD-10-CM | POA: Diagnosis not present

## 2014-07-31 DIAGNOSIS — Z8049 Family history of malignant neoplasm of other genital organs: Secondary | ICD-10-CM

## 2014-07-31 DIAGNOSIS — E440 Moderate protein-calorie malnutrition: Secondary | ICD-10-CM | POA: Insufficient documentation

## 2014-07-31 DIAGNOSIS — Z681 Body mass index (BMI) 19 or less, adult: Secondary | ICD-10-CM

## 2014-07-31 DIAGNOSIS — R7881 Bacteremia: Secondary | ICD-10-CM

## 2014-07-31 DIAGNOSIS — Z8572 Personal history of non-Hodgkin lymphomas: Secondary | ICD-10-CM | POA: Diagnosis present

## 2014-07-31 DIAGNOSIS — R05 Cough: Secondary | ICD-10-CM | POA: Diagnosis not present

## 2014-07-31 HISTORY — DX: Cervicalgia: M54.2

## 2014-07-31 HISTORY — DX: Reserved for concepts with insufficient information to code with codable children: IMO0002

## 2014-07-31 HISTORY — DX: Dorsalgia, unspecified: M54.9

## 2014-07-31 HISTORY — DX: Pain, unspecified: R52

## 2014-07-31 HISTORY — DX: Diffuse large B-cell lymphoma, unspecified site: C83.30

## 2014-07-31 HISTORY — DX: Other chronic pain: G89.29

## 2014-07-31 LAB — CBC WITH DIFFERENTIAL/PLATELET
Basophils Absolute: 0 K/uL (ref 0.0–0.1)
Basophils Relative: 0 % (ref 0–1)
Eosinophils Absolute: 0 K/uL (ref 0.0–0.7)
Eosinophils Relative: 0 % (ref 0–5)
HCT: 35.3 % — ABNORMAL LOW (ref 39.0–52.0)
Hemoglobin: 11.6 g/dL — ABNORMAL LOW (ref 13.0–17.0)
Lymphocytes Relative: 12 % (ref 12–46)
Lymphs Abs: 0.5 K/uL — ABNORMAL LOW (ref 0.7–4.0)
MCH: 30 pg (ref 26.0–34.0)
MCHC: 32.9 g/dL (ref 30.0–36.0)
MCV: 91.2 fL (ref 78.0–100.0)
Monocytes Absolute: 0.3 K/uL (ref 0.1–1.0)
Monocytes Relative: 9 % (ref 3–12)
Neutro Abs: 3 K/uL (ref 1.7–7.7)
Neutrophils Relative %: 79 % — ABNORMAL HIGH (ref 43–77)
Platelets: 179 K/uL (ref 150–400)
RBC: 3.87 MIL/uL — ABNORMAL LOW (ref 4.22–5.81)
RDW: 13.5 % (ref 11.5–15.5)
WBC: 3.8 K/uL — ABNORMAL LOW (ref 4.0–10.5)

## 2014-07-31 LAB — COMPREHENSIVE METABOLIC PANEL
ALT: 14 U/L — ABNORMAL LOW (ref 17–63)
ANION GAP: 6 (ref 5–15)
AST: 19 U/L (ref 15–41)
Albumin: 3.3 g/dL — ABNORMAL LOW (ref 3.5–5.0)
Alkaline Phosphatase: 43 U/L (ref 38–126)
BUN: 9 mg/dL (ref 6–20)
CO2: 28 mmol/L (ref 22–32)
CREATININE: 0.78 mg/dL (ref 0.61–1.24)
Calcium: 7.9 mg/dL — ABNORMAL LOW (ref 8.9–10.3)
Chloride: 104 mmol/L (ref 101–111)
GFR calc Af Amer: >60 mL/min (ref >60)
GFR calc non Af Amer: >60 mL/min (ref >60)
Glucose, Bld: 102 mg/dL — ABNORMAL HIGH (ref 65–99)
Potassium: 3.6 mmol/L (ref 3.5–5.1)
Sodium: 138 mmol/L (ref 135–145)
Total Bilirubin: 0.7 mg/dL (ref 0.3–1.2)
Total Protein: 5.9 g/dL — ABNORMAL LOW (ref 6.5–8.1)

## 2014-07-31 LAB — CBC
HCT: 33.9 % — ABNORMAL LOW (ref 39.0–52.0)
HEMOGLOBIN: 11 g/dL — AB (ref 13.0–17.0)
MCH: 30 pg (ref 26.0–34.0)
MCHC: 32.4 g/dL (ref 30.0–36.0)
MCV: 92.4 fL (ref 78.0–100.0)
Platelets: 194 10*3/uL (ref 150–400)
RBC: 3.67 MIL/uL — ABNORMAL LOW (ref 4.22–5.81)
RDW: 13.7 % (ref 11.5–15.5)
WBC: 3.7 10*3/uL — AB (ref 4.0–10.5)

## 2014-07-31 LAB — URINALYSIS, ROUTINE W REFLEX MICROSCOPIC
BILIRUBIN URINE: NEGATIVE
GLUCOSE, UA: NEGATIVE mg/dL
HGB URINE DIPSTICK: NEGATIVE
Ketones, ur: NEGATIVE mg/dL
Leukocytes, UA: NEGATIVE
NITRITE: NEGATIVE
PH: 6.5 (ref 5.0–8.0)
Protein, ur: NEGATIVE mg/dL
SPECIFIC GRAVITY, URINE: 1.02 (ref 1.005–1.030)
Urobilinogen, UA: 0.2 mg/dL (ref 0.0–1.0)

## 2014-07-31 LAB — CREATININE, SERUM
CREATININE: 0.69 mg/dL (ref 0.61–1.24)
GFR calc non Af Amer: >60 mL/min (ref >60)

## 2014-07-31 LAB — LIPASE, BLOOD: LIPASE: 20 U/L — AB (ref 22–51)

## 2014-07-31 LAB — LACTIC ACID, PLASMA
LACTIC ACID, VENOUS: 1.2 mmol/L (ref 0.5–2.0)
Lactic Acid, Venous: 1.1 mmol/L (ref 0.5–2.0)

## 2014-07-31 LAB — PROCALCITONIN: PROCALCITONIN: 1.24 ng/mL

## 2014-07-31 LAB — APTT: aPTT: 35 seconds (ref 24–37)

## 2014-07-31 LAB — PROTIME-INR
INR: 1.31 (ref 0.00–1.49)
PROTHROMBIN TIME: 16.4 s — AB (ref 11.6–15.2)

## 2014-07-31 MED ORDER — DEXTROSE 5 % IV SOLN
2.0000 g | Freq: Once | INTRAVENOUS | Status: AC
  Administered 2014-07-31: 2 g via INTRAVENOUS
  Filled 2014-07-31: qty 2

## 2014-07-31 MED ORDER — SODIUM CHLORIDE 0.9 % IV SOLN
INTRAVENOUS | Status: DC
  Administered 2014-07-31 – 2014-08-04 (×7): via INTRAVENOUS
  Administered 2014-08-05: 1000 mL via INTRAVENOUS

## 2014-07-31 MED ORDER — NICOTINE 14 MG/24HR TD PT24
14.0000 mg | MEDICATED_PATCH | Freq: Every day | TRANSDERMAL | Status: DC
  Administered 2014-07-31 – 2014-08-05 (×6): 14 mg via TRANSDERMAL
  Filled 2014-07-31 (×7): qty 1

## 2014-07-31 MED ORDER — LEVOFLOXACIN IN D5W 750 MG/150ML IV SOLN: 750.0000 mg | INTRAVENOUS | Status: DC

## 2014-07-31 MED ORDER — FENTANYL CITRATE (PF) 100 MCG/2ML IJ SOLN
12.5000 ug | Freq: Once | INTRAMUSCULAR | Status: AC
  Administered 2014-07-31: 12.5 ug via INTRAVENOUS
  Filled 2014-07-31: qty 2

## 2014-07-31 MED ORDER — GABAPENTIN 300 MG PO CAPS
600.0000 mg | ORAL_CAPSULE | Freq: Three times a day (TID) | ORAL | Status: DC
  Administered 2014-07-31 – 2014-08-05 (×14): 600 mg via ORAL
  Filled 2014-07-31 (×15): qty 2

## 2014-07-31 MED ORDER — SODIUM CHLORIDE 0.9 % IV SOLN: 1000.0000 mL | INTRAVENOUS | Status: DC

## 2014-07-31 MED ORDER — SODIUM CHLORIDE 0.9 % IJ SOLN
3.0000 mL | Freq: Two times a day (BID) | INTRAMUSCULAR | Status: DC
  Administered 2014-07-31 – 2014-08-02 (×4): 3 mL via INTRAVENOUS

## 2014-07-31 MED ORDER — LEVETIRACETAM 250 MG PO TABS
250.0000 mg | ORAL_TABLET | Freq: Two times a day (BID) | ORAL | Status: DC
  Administered 2014-07-31 – 2014-08-05 (×9): 250 mg via ORAL
  Filled 2014-07-31 (×13): qty 1

## 2014-07-31 MED ORDER — SODIUM CHLORIDE 0.9 % IV BOLUS (SEPSIS): 500.0000 mL | Freq: Once | INTRAVENOUS | Status: DC

## 2014-07-31 MED ORDER — LEVOFLOXACIN IN D5W 750 MG/150ML IV SOLN
750.0000 mg | Freq: Once | INTRAVENOUS | Status: AC
  Administered 2014-07-31: 750 mg via INTRAVENOUS
  Filled 2014-07-31: qty 150

## 2014-07-31 MED ORDER — ENOXAPARIN SODIUM 40 MG/0.4ML ~~LOC~~ SOLN
40.0000 mg | SUBCUTANEOUS | Status: DC
  Administered 2014-07-31 – 2014-08-05 (×5): 40 mg via SUBCUTANEOUS
  Filled 2014-07-31 (×5): qty 0.4

## 2014-07-31 MED ORDER — DEXTROSE-NACL 5-0.9 % IV SOLN: INTRAVENOUS | Status: DC

## 2014-07-31 MED ORDER — DEXTROSE 5 % IV SOLN
1.0000 g | Freq: Three times a day (TID) | INTRAVENOUS | Status: DC
  Administered 2014-08-01 – 2014-08-04 (×11): 1 g via INTRAVENOUS
  Filled 2014-07-31 (×17): qty 1

## 2014-07-31 MED ORDER — SODIUM CHLORIDE 0.9 % IV BOLUS (SEPSIS)
500.0000 mL | INTRAVENOUS | Status: AC
  Administered 2014-07-31: 500 mL via INTRAVENOUS

## 2014-07-31 MED ORDER — SODIUM CHLORIDE 0.9 % IV BOLUS (SEPSIS)
1000.0000 mL | Freq: Once | INTRAVENOUS | Status: AC
  Administered 2014-07-31: 1000 mL via INTRAVENOUS

## 2014-07-31 MED ORDER — OXYCODONE HCL 5 MG PO TABS
10.0000 mg | ORAL_TABLET | Freq: Three times a day (TID) | ORAL | Status: DC | PRN
  Administered 2014-07-31: 10 mg via ORAL
  Filled 2014-07-31: qty 2

## 2014-07-31 MED ORDER — ACETAMINOPHEN 500 MG PO TABS
1000.0000 mg | ORAL_TABLET | Freq: Once | ORAL | Status: AC
  Administered 2014-07-31: 1000 mg via ORAL
  Filled 2014-07-31: qty 2

## 2014-07-31 NOTE — ED Notes (Signed)
Patient c/o fevers and fatigue x1 week and was told to come to hospital if it progressed by oncologist. Per patient has been in remission from cancer since 2014. Patient also reports nausea and vomiting with back pain and tightness in abd. Patient states he feels dehydrated. Denies any diarrhea. Per patient last BM this morning, normal, no blood noted.

## 2014-07-31 NOTE — Progress Notes (Signed)
[place] for Aztreonam, Levaquin Indication: rule out sepsis  Allergies  Allergen Reactions  . Vancomycin Itching and Rash    Extreme Itching, red skin from head to torso.    Marland Kitchen Cymbalta [Duloxetine Hcl] Other (See Comments)    Suicidal and "crazy"  . Penicillins Hives  . Amoxicillin Rash  . Hydrocodone Rash    Patient Measurements: Height: 5\' 7"  (170.2 cm) Weight: 107 lb (48.535 kg) IBW/kg (Calculated) : 66.1  Vital Signs: Temp: 101.1 F (38.4 C) (06/25 1554) Temp Source: Oral (06/25 1554) BP: 86/49 mmHg (06/25 1800) Pulse Rate: 72 (06/25 1800) Intake/Output from previous day:   Intake/Output from this shift:    Labs:  Recent Labs  07/31/14 1615  WBC 3.8*  HGB 11.6*  PLT 179  CREATININE 0.78   Estimated Creatinine Clearance: 81.7 mL/min (by C-G formula based on Cr of 0.78). No results for input(s): VANCOTROUGH, VANCOPEAK, VANCORANDOM, GENTTROUGH, GENTPEAK, GENTRANDOM, TOBRATROUGH, TOBRAPEAK, TOBRARND, AMIKACINPEAK, AMIKACINTROU, AMIKACIN in the last 72 hours.   Microbiology: Recent Results (from the past 720 hour(s))  Culture, blood (routine x 2)     Status: None (Preliminary result)   Collection Time: 07/31/14  4:15 PM  Result Value Ref Range Status   Specimen Description BLOOD LEFT ARM  Final   Special Requests BOTTLES DRAWN AEROBIC AND ANAEROBIC 10CC BOTTLES  Final   Culture PENDING  Incomplete   Report Status PENDING  Incomplete  Culture, blood (routine x 2)     Status: None (Preliminary result)   Collection Time: 07/31/14  4:17 PM  Result Value Ref Range Status   Specimen Description BLOOD RIGHT ARM  Final   Special Requests BOTTLES DRAWN AEROBIC AND ANAEROBIC 10CC BOTTLES  Final   Culture PENDING  Incomplete   Report Status PENDING  Incomplete    Anti-infectives    Start     Dose/Rate Route Frequency Ordered Stop   07/31/14 1800  levofloxacin (LEVAQUIN) IVPB 750 mg     750 mg 100 mL/hr over 90 Minutes  Intravenous  Once 07/31/14 1745     07/31/14 1800  aztreonam (AZACTAM) 2 g in dextrose 5 % 50 mL IVPB     2 g 100 mL/hr over 30 Minutes Intravenous  Once 07/31/14 1745 07/31/14 1817      Assessment: 44 yo M with hx lymphoma s/p chemotherapy presents with fever (Tm 101.70F) and fatigue.  UA and CXR negative.   Multiple antibiotic allergies noted including PCN and Vancomycin, however he has tolerated cephalosporins in the past.  Initial doses of Aztreonam & Levaquin ordered by MD.  Discussed with Dr Thurnell Garbe and will defer empiric Vancomycin.   Renal function is at patient's baseline.   Goal of Therapy:  Eradicate infection.  Plan:  Aztreonam 1gm IV q8h Levaquin 750mg  IV q24h Monitor renal function and cx data   Biagio Borg 07/31/2014,6:18 PM

## 2014-07-31 NOTE — H&P (Signed)
[facility] History and Physical  Nicholas King:[id number] DOB: [birth year]    PCP:   Janalee Dane, MD   Chief Complaint: fever and chills for several days.   HPI: Nicholas King is an 44 y.o. male with hx of Stage II large B cell lymphoma, Dx April 2014, s/p R-CHOP (completed 6 cycles) and 4 rounds of intrathecal chemotherapy, with follow up staging showed complete response, hx of chronic back pain seen at the pain clinic on chronic narcotics, hx of PNA, saw his oncologist Dr Ileana Ladd at [facility] June 14/16, and was planned to have re-imaging the following 6 months, having fever, anorexia and weight loss, along with mild leukopenia and thrombocytopenia, presented to the ER as he was having persistent fever and chills.  He took his temp yesterday and said it was 103.2.  In the ER, he had a Tmax of 101.  He denied SOB, HA, nausea, vomiting, abdominal pain, diarrhea, sorethroat, coughs, or any skin rash.  Evalaution in the ER showed mild leukopenia with WBC of 3.8K ( 2.7K 11 days ago), Platelet count of179K (136K 11 days ago), negative UA, and clear CXR.  He has an allergy to PCN and Vancomycin as far as antibiotics go, and he was given IV Azactam and IV Levoquin.  He was cultured.  He was hypotensive, but tolerated quite well, and his lactic acid was normal.   Rewiew of Systems:  Constitutional: Negative for malaise, fever and chills. No significant weight loss or weight gain Eyes: Negative for eye pain, redness and discharge, diplopia, visual changes, or flashes of light. ENMT: Negative for ear pain, hoarseness, nasal congestion, sinus pressure and sore throat. No headaches; tinnitus, drooling, or problem swallowing. Cardiovascular: Negative for chest pain, palpitations, diaphoresis, dyspnea and peripheral edema. ; No orthopnea, PND Respiratory: Negative for cough, hemoptysis, wheezing and stridor. No pleuritic chestpain. Gastrointestinal: Negative for nausea, vomiting,  diarrhea, constipation, abdominal pain, melena, blood in stool, hematemesis, jaundice and rectal bleeding.    Genitourinary: Negative for frequency, dysuria, incontinence,flank pain and hematuria; Musculoskeletal: Negative for back pain and neck pain. Negative for swelling and trauma.;  Skin: . Negative for pruritus, rash, abrasions, bruising and skin lesion.; ulcerations Neuro: Negative for headache, lightheadedness and neck stiffness. Negative for weakness, altered level of consciousness , altered mental status, extremity weakness, burning feet, involuntary movement, seizure and syncope.  Psych: negative for anxiety, depression, insomnia, tearfulness, panic attacks, hallucinations, paranoia, suicidal or homicidal ideation    Past Medical History  Diagnosis Date  . GERD (gastroesophageal reflux disease)   . Cancer   . Lymphoma   . Dysphagia, unspecified(787.20) 05/16/2012  . Pneumonia     April 2014  . 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 Neuropathic pain 11/06/2012  . Radiation 10/30/12-11/25/12    Bilateral Neck/Tonsil  . Neuropathy 12/26/2012  . Bone pain 12/26/2012  . Lumbago 01/06/2013  . Radiation 10/30/12-11/25/12    Bilateral neck and tonsils  . Compression fracture 01/2014    L1  . Diffuse large b-cell lymphoma   . Chronic neck pain     Hx C7 fx after fall 01/2014  . Pain management   . Chronic back pain     Past Surgical History  Procedure Laterality Date  . No past surgeries    . Direct laryngoscopy Left 05/08/2012    Procedure: DIRECT LARYNGOSCOPY with biopsy left tonsil;  Surgeon: Melissa Montane, MD;  Location: [place];  Service: ENT;  Laterality: Left;  . Right eyebrown bone  Right frontal skull fracture    Medications:  HOME MEDS: Prior to Admission medications   Medication Sig Start Date End Date Taking? Authorizing Provider  Cholecalciferol (VITAMIN D-3) 5000 UNITS TABS Take 1 tablet by mouth daily.   Yes Historical Provider, MD  gabapentin (NEURONTIN)  300 MG capsule Take 600 mg by mouth 3 (three) times daily.   Yes Historical Provider, MD  levETIRAcetam (KEPPRA) 250 MG tablet Take 250 mg by mouth 2 (two) times daily. 07/15/14  Yes Historical Provider, MD  meloxicam (MOBIC) 15 MG tablet Take 15 mg by mouth daily. 07/15/14  Yes Historical Provider, MD  oxyCODONE 10 MG TABS Take 1 tablet (10 mg total) by mouth 3 (three) times daily as needed for severe pain. 01/19/14  Yes Heath Lark, MD     Allergies:  Allergies  Allergen Reactions  . Vancomycin Itching and Rash    Extreme Itching, red skin from head to torso.    Marland Kitchen Cymbalta [Duloxetine Hcl] Other (See Comments)    Suicidal and "crazy"  . Penicillins Hives  . Amoxicillin Rash  . Hydrocodone Rash    Social History:   reports that he has been smoking Cigarettes.  He has a 12.5 pack-year smoking history. He has never used smokeless tobacco. He reports that he drinks alcohol. He reports that he uses illicit drugs (Marijuana).  Family History: Family History  Problem Relation Age of Onset  . Cancer Father     lung  . Diabetes Father   . Cancer Maternal Aunt     gyn cancer, pancreas  . Cancer Paternal Aunt     pancreas cancer  . Cancer Paternal Uncle     unk  . Cancer Sister      Physical Exam: Filed Vitals:   07/31/14 1730 07/31/14 1745 07/31/14 1800 07/31/14 1815  BP: 96/60  86/49 99/65  Pulse: 79 80 72 82  Temp:      TempSrc:      Resp:    17  Height:      Weight:      SpO2: 98% 97% 98% 98%   Blood pressure 99/65, pulse 82, temperature 101.1 F (38.4 C), temperature source Oral, resp. rate 17, height 5\' 7"  (1.702 m), weight 48.535 kg (107 lb), SpO2 98 %.  GEN:  Pleasant  patient lying in the stretcher in no acute distress; cooperative with exam. PSYCH:  alert and oriented x4; does not appear anxious or depressed; affect is appropriate. HEENT: Mucous membranes pink and anicteric; PERRLA; EOM intact; no cervical lymphadenopathy nor thyromegaly or carotid bruit; no JVD;  There were no stridor. Neck is very supple. Breasts:: Not examined CHEST WALL: No tenderness CHEST: Normal respiration, clear to auscultation bilaterally.  HEART: Regular rate and rhythm.  There are no murmur, rub, or gallops.   BACK: No kyphosis or scoliosis; no CVA tenderness ABDOMEN: soft and non-tender; no masses, no organomegaly, normal abdominal bowel sounds; no pannus; no intertriginous candida. There is no rebound and no distention. Rectal Exam: Not done EXTREMITIES: No bone or joint deformity; age-appropriate arthropathy of the hands and knees; no edema; no ulcerations.  There is no calf tenderness. Genitalia: not examined PULSES: 2+ and symmetric SKIN: Normal hydration no rash or ulceration CNS: Cranial nerves 2-12 grossly intact no focal lateralizing neurologic deficit.  Speech is fluent; uvula elevated with phonation, facial symmetry and tongue midline. DTR are normal bilaterally, cerebella exam is intact, barbinski is negative and strengths are equaled bilaterally.  No sensory loss.  Labs on Admission:  Basic Metabolic Panel:  Recent Labs Lab 07/31/14 1615  NA 138  K 3.6  CL 104  CO2 28  GLUCOSE 102*  BUN 9  CREATININE 0.78  CALCIUM 7.9*   Liver Function Tests:  Recent Labs Lab 07/31/14 1615  AST 19  ALT 14*  ALKPHOS 43  BILITOT 0.7  PROT 5.9*  ALBUMIN 3.3*    Recent Labs Lab 07/31/14 1615  LIPASE 20*    Recent Labs Lab 07/31/14 1615  WBC 3.8*  NEUTROABS 3.0  HGB 11.6*  HCT 35.3*  MCV 91.2  PLT 179    Radiological Exams on Admission: Dg Chest 2 View  07/31/2014   CLINICAL DATA:  Cough, congestion, fever.  History of pneumonia.  EXAM: CHEST  2 VIEW  COMPARISON:  01/10/2014  FINDINGS: Lungs are hyperinflated. There is prominence of interstitial markings, likely chronic. There are no focal consolidations or pleural effusions. No pulmonary edema.  Sequelae of L1 compression fracture noted. No further compression identified.  IMPRESSION: 1.  Hyperinflation. 2.  No focal acute pulmonary abnormality.   Electronically Signed   By: Nolon Nations M.D.   On: 07/31/2014 17:05    EKG: Independently reviewed.   Assessment/Plan Present on Admission:  . Fever chills . Cigarette smoker . Diffuse large B cell lymphoma . Leukopenia . Thrombocytopenia . Sepsis  PLAN:  Will admit him to telemetry for fever and chills of unclear source.  He has a little hypotension, and is a little volume depleted.  His fever could be a recurrence of his Lymphoma.  I will transfer him to [place] as his oncologist is there, and if there will be a need for infectious disease consult, it would be easier there.  He doesn't need any urgent consultation at this time, as he quite stable.  His lactic acid is normal also.  He had blood culture and was given IV Levaquin along with Azactam, and we will continue with those.  He is a full code and will be admitted into the [facility] service.  Thank you for letting me participate in his care.   Other plans as per orders.  Code Status: Trinna Post, MD. [facility] Pager [phone] 7pm to 7am.  07/31/2014, 6:37 PM

## 2014-07-31 NOTE — ED Provider Notes (Signed)
CSN: [id number]     Arrival date & time 07/31/14  1552 History   First MD Initiated Contact with Patient 07/31/14 1604     Chief Complaint  Patient presents with  . Fever      HPI Pt was seen at 1605. Per pt, c/o gradual onset and worsening of persistent home fevers to "103" for the past 1 week. Has been associated with generalized weakness/fatigue, cough, and N/V. Pt was evaluated by his Heme/Onc [facility] on 07/20/14 for same and was told if his fevers persisted, he needed to come to the hospital for evaluation. Pt has hx of B-cell lymphoma with LD chemo 2014. Pt states he is "due for more scans to make sure my cancer hasn't come back." Denies CP/SOB, no abd pain, no diarrhea, no black or blood in stools or emesis, no rash.    Past Medical History  Diagnosis Date  . GERD (gastroesophageal reflux disease)   . Cancer   . Lymphoma   . Dysphagia, unspecified(787.20) 05/16/2012  . Pneumonia     April 2014  . 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 Neuropathic pain 11/06/2012  . Radiation 10/30/12-11/25/12    Bilateral Neck/Tonsil  . Neuropathy 12/26/2012  . Bone pain 12/26/2012  . Lumbago 01/06/2013  . Radiation 10/30/12-11/25/12    Bilateral neck and tonsils  . Compression fracture 01/2014    L1  . Diffuse large b-cell lymphoma   . Chronic neck pain     Hx C7 fx after fall 01/2014  . Pain management   . Chronic back pain    Past Surgical History  Procedure Laterality Date  . No past surgeries    . Direct laryngoscopy Left 05/08/2012    Procedure: DIRECT LARYNGOSCOPY with biopsy left tonsil;  Surgeon: Melissa Montane, MD;  Location: [facility];  Service: ENT;  Laterality: Left;  . Right eyebrown bone      Right frontal skull fracture   Family History  Problem Relation Age of Onset  . Cancer Father     lung  . Diabetes Father   . Cancer Maternal Aunt     gyn cancer, pancreas  . Cancer Paternal Aunt     pancreas cancer  . Cancer Paternal Uncle     unk  . Cancer Sister    History   Substance Use Topics  . Smoking status: Current Every Day Smoker -- 0.50 packs/day for 25 years    Types: Cigarettes  . Smokeless tobacco: Never Used  . Alcohol Use: Yes     Comment: seldom    Review of Systems ROS: Statement: All systems negative except as marked or noted in the HPI; Constitutional: +fever and chills, generalized weakness/fatigue.; ; Eyes: Negative for eye pain, redness and discharge. ; ; ENMT: Negative for ear pain, hoarseness, nasal congestion, sinus pressure and sore throat. ; ; Cardiovascular: Negative for chest pain, palpitations, diaphoresis, dyspnea and peripheral edema. ; ; Respiratory: +cough. Negative for wheezing and stridor. ; ; Gastrointestinal: +N/V. Negative for diarrhea, abdominal pain, blood in stool, hematemesis, jaundice and rectal bleeding. . ; ; Genitourinary: Negative for dysuria, flank pain and hematuria. ; ; Musculoskeletal: Negative for back pain and neck pain. Negative for swelling and trauma.; ; Skin: Negative for pruritus, rash, abrasions, blisters, bruising and skin lesion.; ; Neuro: Negative for headache, lightheadedness and neck stiffness. Negative for weakness, altered level of consciousness , altered mental status, extremity weakness, paresthesias, involuntary movement, seizure and syncope.      Allergies  Vancomycin; Cymbalta;  Penicillins; Amoxicillin; and Hydrocodone  Home Medications   Prior to Admission medications   Medication Sig Start Date End Date Taking? Authorizing Provider  Cholecalciferol (VITAMIN D-3) 5000 UNITS TABS Take 1 tablet by mouth daily.   Yes Historical Provider, MD  gabapentin (NEURONTIN) 300 MG capsule Take 600 mg by mouth 3 (three) times daily.   Yes Historical Provider, MD  levETIRAcetam (KEPPRA) 250 MG tablet Take 250 mg by mouth 2 (two) times daily. 07/15/14  Yes Historical Provider, MD  meloxicam (MOBIC) 15 MG tablet Take 15 mg by mouth daily. 07/15/14  Yes Historical Provider, MD  oxyCODONE 10 MG TABS Take 1  tablet (10 mg total) by mouth 3 (three) times daily as needed for severe pain. 01/19/14  Yes Ni Alvy Bimler, MD   BP 114/72 mmHg  Pulse 102  Temp(Src) 101.1 F (38.4 C) (Oral)  Resp 20  Ht 5\' 7"  (1.702 m)  Wt 107 lb (48.535 kg)  BMI 16.75 kg/m2  SpO2 98%    Filed Vitals:   07/31/14 1645 07/31/14 1700 07/31/14 1715 07/31/14 1730  BP:  99/66  96/60  Pulse: 81 79 90 79  Temp:      TempSrc:      Resp:      Height:      Weight:      SpO2: 94% 95% 99% 98%     16:30 Orthostatic Vital Signs DM  Orthostatic Lying  - BP- Lying: 111/72 mmHg ; Pulse- Lying: 83  Orthostatic Sitting - BP- Sitting: 108/67 mmHg ; Pulse- Sitting: 90  Orthostatic Standing at 0 minutes - BP- Standing at 0 minutes: 101/63 mmHg ; Pulse- Standing at 0 minutes: 97      Physical Exam  1610: Physical examination:  Nursing notes reviewed; Vital signs and O2 SAT reviewed; +febrile;; Constitutional: Cachectic. In no acute distress; Head:  Normocephalic, atraumatic; Eyes: EOMI, PERRL, No scleral icterus; ENMT: Mouth and pharynx normal, Mucous membranes dry; Neck: Supple, Full range of motion, No lymphadenopathy. No meningeal signs.; Cardiovascular: Tachycardic rate and rhythm, No gallop; Respiratory: Breath sounds clear & equal bilaterally, No wheezes.  Speaking full sentences with ease, Normal respiratory effort/excursion; Chest: Nontender, Movement normal; Abdomen: Soft, Nontender, Nondistended, Normal bowel sounds; Genitourinary: No CVA tenderness; Extremities: Pulses normal, No tenderness, No edema, No calf edema or asymmetry.; Neuro: AA&Ox3, Major CN grossly intact.  Speech clear. No gross focal motor or sensory deficits in extremities.; Skin: Color normal, Warm, Dry.    ED Course  Procedures     EKG Interpretation None      MDM  MDM Reviewed: previous chart, nursing note and vitals Reviewed previous: labs Interpretation: labs and x-ray   Results for orders placed or performed during the hospital encounter  of 07/31/14  Culture, blood (routine x 2)  Result Value Ref Range   Specimen Description BLOOD LEFT ARM    Special Requests BOTTLES DRAWN AEROBIC AND ANAEROBIC 10CC BOTTLES    Culture PENDING    Report Status PENDING   Culture, blood (routine x 2)  Result Value Ref Range   Specimen Description BLOOD RIGHT ARM    Special Requests BOTTLES DRAWN AEROBIC AND ANAEROBIC 10CC BOTTLES    Culture PENDING    Report Status PENDING   Urinalysis, Routine w reflex microscopic (not at [facility])  Result Value Ref Range   Color, Urine YELLOW YELLOW   APPearance CLEAR CLEAR   Specific Gravity, Urine 1.020 1.005 - 1.030   pH 6.5 5.0 - 8.0   Glucose, UA NEGATIVE  NEGATIVE mg/dL   Hgb urine dipstick NEGATIVE NEGATIVE   Bilirubin Urine NEGATIVE NEGATIVE   Ketones, ur NEGATIVE NEGATIVE mg/dL   Protein, ur NEGATIVE NEGATIVE mg/dL   Urobilinogen, UA 0.2 0.0 - 1.0 mg/dL   Nitrite NEGATIVE NEGATIVE   Leukocytes, UA NEGATIVE NEGATIVE  Comprehensive metabolic panel  Result Value Ref Range   Sodium 138 135 - 145 mmol/L   Potassium 3.6 3.5 - 5.1 mmol/L   Chloride 104 101 - 111 mmol/L   CO2 28 22 - 32 mmol/L   Glucose, Bld 102 (H) 65 - 99 mg/dL   BUN 9 6 - 20 mg/dL   Creatinine, Ser 0.78 0.61 - 1.24 mg/dL   Calcium 7.9 (L) 8.9 - 10.3 mg/dL   Total Protein 5.9 (L) 6.5 - 8.1 g/dL   Albumin 3.3 (L) 3.5 - 5.0 g/dL   AST 19 15 - 41 U/L   ALT 14 (L) 17 - 63 U/L   Alkaline Phosphatase 43 38 - 126 U/L   Total Bilirubin 0.7 0.3 - 1.2 mg/dL   GFR calc non Af Amer >60 >60 mL/min   GFR calc Af Amer >60 >60 mL/min   Anion gap 6 5 - 15  Lipase, blood  Result Value Ref Range   Lipase 20 (L) 22 - 51 U/L  Lactic acid, plasma  Result Value Ref Range   Lactic Acid, Venous 1.2 0.5 - 2.0 mmol/L  CBC with Differential  Result Value Ref Range   WBC 3.8 (L) 4.0 - 10.5 K/uL   RBC 3.87 (L) 4.22 - 5.81 MIL/uL   Hemoglobin 11.6 (L) 13.0 - 17.0 g/dL   HCT 35.3 (L) 39.0 - 52.0 %   MCV 91.2 78.0 - 100.0 fL   MCH 30.0 26.0 -  34.0 pg   MCHC 32.9 30.0 - 36.0 g/dL   RDW 13.5 11.5 - 15.5 %   Platelets 179 150 - 400 K/uL   Neutrophils Relative % 79 (H) 43 - 77 %   Neutro Abs 3.0 1.7 - 7.7 K/uL   Lymphocytes Relative 12 12 - 46 %   Lymphs Abs 0.5 (L) 0.7 - 4.0 K/uL   Monocytes Relative 9 3 - 12 %   Monocytes Absolute 0.3 0.1 - 1.0 K/uL   Eosinophils Relative 0 0 - 5 %   Eosinophils Absolute 0.0 0.0 - 0.7 K/uL   Basophils Relative 0 0 - 1 %   Basophils Absolute 0.0 0.0 - 0.1 K/uL   Dg Chest 2 View 07/31/2014   CLINICAL DATA:  Cough, congestion, fever.  History of pneumonia.  EXAM: CHEST  2 VIEW  COMPARISON:  01/10/2014  FINDINGS: Lungs are hyperinflated. There is prominence of interstitial markings, likely chronic. There are no focal consolidations or pleural effusions. No pulmonary edema.  Sequelae of L1 compression fracture noted. No further compression identified.  IMPRESSION: 1. Hyperinflation. 2.  No focal acute pulmonary abnormality.   Electronically Signed   By: Nolon Nations M.D.   On: 07/31/2014 17:05      1755:   Pt orthostatic on VS. Progressively hypotensive, compared to baseline BP's in EPIC system. Pt has already received IVF NS 1L bolus, will start another 1L NS bolus (6ml/kg). APAP given for fever. Workup without acute source for fever at this time; [facility] and UC are pending. IV abx for undifferentiated sepsis ordered. Dx and testing d/w pt.  Questions answered.  Verb understanding, agreeable to admit. T/C to [facility] [place] Comment, case discussed, including:  HPI, pertinent PM/SHx, VS/PE,  dx testing, ED course and treatment:  Agreeable to admit, requests he will come to the ED for evaluation.   Francine Graven, DO 08/03/14 2158

## 2014-07-31 NOTE — ED Notes (Signed)
Instructed pt to remove weather coat and not eat or drink fluids until seen by MD.

## 2014-08-01 LAB — CBC WITH DIFFERENTIAL/PLATELET
BASOS PCT: 0 % (ref 0–1)
Basophils Absolute: 0 10*3/uL (ref 0.0–0.1)
EOS PCT: 1 % (ref 0–5)
Eosinophils Absolute: 0 10*3/uL (ref 0.0–0.7)
HCT: 36.7 % — ABNORMAL LOW (ref 39.0–52.0)
HEMOGLOBIN: 12.1 g/dL — AB (ref 13.0–17.0)
Lymphocytes Relative: 27 % (ref 12–46)
Lymphs Abs: 0.8 10*3/uL (ref 0.7–4.0)
MCH: 29.8 pg (ref 26.0–34.0)
MCHC: 33 g/dL (ref 30.0–36.0)
MCV: 90.4 fL (ref 78.0–100.0)
MONO ABS: 0.3 10*3/uL (ref 0.1–1.0)
MONOS PCT: 11 % (ref 3–12)
NEUTROS ABS: 1.8 10*3/uL (ref 1.7–7.7)
Neutrophils Relative %: 61 % (ref 43–77)
Platelets: 250 10*3/uL (ref 150–400)
RBC: 4.06 MIL/uL — ABNORMAL LOW (ref 4.22–5.81)
RDW: 13.5 % (ref 11.5–15.5)
WBC: 3 10*3/uL — ABNORMAL LOW (ref 4.0–10.5)

## 2014-08-01 LAB — BASIC METABOLIC PANEL
Anion gap: 5 (ref 5–15)
CALCIUM: 6.8 mg/dL — AB (ref 8.9–10.3)
CHLORIDE: 112 mmol/L — AB (ref 101–111)
CO2: 23 mmol/L (ref 22–32)
Creatinine, Ser: 0.64 mg/dL (ref 0.61–1.24)
GFR calc Af Amer: >60 mL/min (ref >60)
GFR calc non Af Amer: >60 mL/min (ref >60)
GLUCOSE: 92 mg/dL (ref 65–99)
POTASSIUM: 3.7 mmol/L (ref 3.5–5.1)
SODIUM: 140 mmol/L (ref 135–145)

## 2014-08-01 LAB — CBC
HEMATOCRIT: 35.7 % — AB (ref 39.0–52.0)
Hemoglobin: 11.7 g/dL — ABNORMAL LOW (ref 13.0–17.0)
MCH: 30.2 pg (ref 26.0–34.0)
MCHC: 32.8 g/dL (ref 30.0–36.0)
MCV: 92.2 fL (ref 78.0–100.0)
Platelets: 189 10*3/uL (ref 150–400)
RBC: 3.87 MIL/uL — AB (ref 4.22–5.81)
RDW: 13.7 % (ref 11.5–15.5)
WBC: 3.2 10*3/uL — AB (ref 4.0–10.5)

## 2014-08-01 LAB — LACTIC ACID, PLASMA: Lactic Acid, Venous: 0.9 mmol/L (ref 0.5–2.0)

## 2014-08-01 MED ORDER — ENSURE ENLIVE PO LIQD
237.0000 mL | Freq: Three times a day (TID) | ORAL | Status: DC
  Administered 2014-08-01 – 2014-08-04 (×10): 237 mL via ORAL

## 2014-08-01 MED ORDER — OXYCODONE HCL 5 MG PO TABS
10.0000 mg | ORAL_TABLET | Freq: Four times a day (QID) | ORAL | Status: DC | PRN
  Administered 2014-08-01 – 2014-08-05 (×17): 10 mg via ORAL
  Filled 2014-08-01 (×18): qty 2

## 2014-08-01 MED ORDER — DOXYCYCLINE HYCLATE 100 MG PO TABS
100.0000 mg | ORAL_TABLET | Freq: Two times a day (BID) | ORAL | Status: DC
  Administered 2014-08-01 – 2014-08-02 (×3): 100 mg via ORAL
  Filled 2014-08-01 (×3): qty 1

## 2014-08-01 NOTE — Progress Notes (Signed)
Patient Demographics  Nicholas King, is a 44 y.o. male, DOB - [birth year], [id number]:[id number]  Admit date - 07/31/2014   Admitting Physician Orvan Falconer, MD  Outpatient Primary MD for the patient is Nicholas III, [name], MD  LOS - 1   Chief Complaint  Patient presents with  . Fever       Admission HPI/Brief narrative: 44 year old male with history of large B-cell lymphoma, post-to be in remission, presents with fever, blood workup significant for leukopenia, admitted for further workup.  Subjective:   [name] today has, No headache, No chest pain, No abdominal pain - No Nausea, No new weakness tingling or numbness, No Cough - SOB. MAXIMUM TEMPERATURE 101.1 over the last 24 hours.  Assessment & Plan    Principal Problem:   Fever chills Active Problems:   Diffuse large B cell lymphoma   Cigarette smoker   Leukopenia   Thrombocytopenia   Sepsis  Fever - 4 negative urinalysis, negative chest x-ray, blood culture showing no growth so far, continue with doxycycline and Azactam(agent with penicillin and vancomycin allergy), will discuss with oncology Dr. Alvy Bimler tomorrow, to see if patient needs any further workup to evaluate for his diffuse large B lymphoma, as a source of his fever.  Diffuse large B-cell lymphoma - Patient supposed to be having follow-up and repeat imaging within next 6 month, will discuss with patient's oncologist in a.m., please see up of.  Cigarettes smoking - Counseled to quit  Leukopenia - Actually seems to be improving, as little before 10 days as an outpatient was 2.5, patient is not neutropenic  Code Status: full  Family Communication: None at bedside  Disposition Plan: Home once stable   Procedures  None   Consults   None   Medications  Scheduled Meds: . aztreonam  1 g Intravenous Q8H  . doxycycline  100 mg Oral Q12H  . enoxaparin (LOVENOX)  injection  40 mg Subcutaneous Q24H  . gabapentin  600 mg Oral TID  . levETIRAcetam  250 mg Oral BID  . nicotine  14 mg Transdermal Daily  . sodium chloride  500 mL Intravenous Once  . sodium chloride  3 mL Intravenous Q12H   Continuous Infusions: . sodium chloride 125 mL/hr at 08/01/14 1111   PRN Meds:.oxyCODONE  DVT Prophylaxis  Lovenox -   Lab Results  Component Value Date   PLT 189 08/01/2014    Antibiotics    Anti-infectives    Start     Dose/Rate Route Frequency Ordered Stop   08/01/14 1800  levofloxacin (LEVAQUIN) IVPB 750 mg  Status:  Discontinued     750 mg 100 mL/hr over 90 Minutes Intravenous Every 24 hours 07/31/14 1833 08/01/14 0739   08/01/14 1000  doxycycline (VIBRA-TABS) tablet 100 mg     100 mg Oral Every 12 hours 08/01/14 0739     08/01/14 0200  aztreonam (AZACTAM) 1 g in dextrose 5 % 50 mL IVPB     1 g 100 mL/hr over 30 Minutes Intravenous Every 8 hours 07/31/14 1833     07/31/14 1800  levofloxacin (LEVAQUIN) IVPB 750 mg     750 mg 100 mL/hr over 90 Minutes Intravenous  Once 07/31/14 1745 07/31/14 1936   07/31/14 1800  aztreonam (  AZACTAM) 2 g in dextrose 5 % 50 mL IVPB     2 g 100 mL/hr over 30 Minutes Intravenous  Once 07/31/14 1745 07/31/14 1817          Objective:   Filed Vitals:   07/31/14 2124 07/31/14 2309 08/01/14 0126 08/01/14 0449  BP: 110/74 117/91 155/98 147/89  Pulse: 71 67 55 73  Temp: 98.3 F (36.8 C) 98.4 F (36.9 C) 98.4 F (36.9 C) 99.1 F (37.3 C)  TempSrc: Oral Oral Oral Oral  Resp: 18 16 16 18   Height:      Weight:      SpO2: 100% 100% 99% 98%    Wt Readings from Last 3 Encounters:  07/31/14 51.801 kg (114 lb 3.2 oz)  07/20/14 48.671 kg (107 lb 4.8 oz)  01/19/14 49.941 kg (110 lb 1.6 oz)     Intake/Output Summary (Last 24 hours) at 08/01/14 1303 Last data filed at 08/01/14 0701  Gross per 24 hour  Intake 2458.33 ml  Output   1450 ml  Net 1008.33 ml     Physical Exam  Awake Alert, Oriented X 3, No new  F.N deficits, Normal affect [place].AT,PERRAL Supple Neck,No JVD, No cervical lymphadenopathy appriciated.  Symmetrical Chest wall movement, Good air movement bilaterally, CTAB RRR,No Gallops,Rubs or new Murmurs, No Parasternal Heave +ve B.Sounds, Abd Soft, No tenderness, No organomegaly appriciated, No rebound - guarding or rigidity. No Cyanosis, Clubbing or edema, No new Rash or bruise     Data Review   Micro Results Recent Results (from the past 240 hour(s))  Culture, blood (routine x 2)     Status: None (Preliminary result)   Collection Time: 07/31/14  4:15 PM  Result Value Ref Range Status   Specimen Description BLOOD LEFT ARM  Final   Special Requests BOTTLES DRAWN AEROBIC AND ANAEROBIC 10CC BOTTLES  Final   Culture NO GROWTH < 24 HOURS  Final   Report Status PENDING  Incomplete  Culture, blood (routine x 2)     Status: None (Preliminary result)   Collection Time: 07/31/14  4:17 PM  Result Value Ref Range Status   Specimen Description BLOOD RIGHT ARM  Final   Special Requests BOTTLES DRAWN AEROBIC AND ANAEROBIC 10CC BOTTLES  Final   Culture NO GROWTH < 24 HOURS  Final   Report Status PENDING  Incomplete    Radiology Reports Dg Chest 2 View  07/31/2014   CLINICAL DATA:  Cough, congestion, fever.  History of pneumonia.  EXAM: CHEST  2 VIEW  COMPARISON:  01/10/2014  FINDINGS: Lungs are hyperinflated. There is prominence of interstitial markings, likely chronic. There are no focal consolidations or pleural effusions. No pulmonary edema.  Sequelae of L1 compression fracture noted. No further compression identified.  IMPRESSION: 1. Hyperinflation. 2.  No focal acute pulmonary abnormality.   Electronically Signed   By: Nolon Nations M.D.   On: 07/31/2014 17:05     CBC  Recent Labs Lab 07/31/14 1615 07/31/14 2150 08/01/14 0115  WBC 3.8* 3.7* 3.2*  HGB 11.6* 11.0* 11.7*  HCT 35.3* 33.9* 35.7*  PLT 179 194 189  MCV 91.2 92.4 92.2  MCH 30.0 30.0 30.2  MCHC 32.9 32.4 32.8  RDW  13.5 13.7 13.7  LYMPHSABS 0.5*  --   --   MONOABS 0.3  --   --   EOSABS 0.0  --   --   BASOSABS 0.0  --   --     Chemistries   Recent Labs Lab 07/31/14  1615 07/31/14 2150 08/01/14 0115  NA 138  --  140  K 3.6  --  3.7  CL 104  --  112*  CO2 28  --  23  GLUCOSE 102*  --  92  BUN 9  --  <5*  CREATININE 0.78 0.69 0.64  CALCIUM 7.9*  --  6.8*  AST 19  --   --   ALT 14*  --   --   ALKPHOS 43  --   --   BILITOT 0.7  --   --    ------------------------------------------------------------------------------------------------------------------ estimated creatinine clearance is 87.2 mL/min (by C-G formula based on Cr of 0.64). ------------------------------------------------------------------------------------------------------------------ No results for input(s): HGBA1C in the last 72 hours. ------------------------------------------------------------------------------------------------------------------ No results for input(s): CHOL, HDL, LDLCALC, TRIG, CHOLHDL, LDLDIRECT in the last 72 hours. ------------------------------------------------------------------------------------------------------------------ No results for input(s): TSH, T4TOTAL, T3FREE, THYROIDAB in the last 72 hours.  Invalid input(s): FREET3 ------------------------------------------------------------------------------------------------------------------ No results for input(s): VITAMINB12, FOLATE, FERRITIN, TIBC, IRON, RETICCTPCT in the last 72 hours.  Coagulation profile  Recent Labs Lab 07/31/14 2150  INR 1.31    No results for input(s): DDIMER in the last 72 hours.  Cardiac Enzymes No results for input(s): CKMB, TROPONINI, MYOGLOBIN in the last 168 hours.  Invalid input(s): CK ------------------------------------------------------------------------------------------------------------------ Invalid input(s): POCBNP     Time Spent in minutes   30 minutes   Tola Meas M.D on 08/01/2014 at  1:03 PM  Between 7am to 7pm - Pager - [phone]  After 7pm go to [email or web address] - password [facility]  [facility]   Office  [phone]

## 2014-08-02 ENCOUNTER — Ambulatory Visit (HOSPITAL_COMMUNITY): Payer: Medicare Other

## 2014-08-02 ENCOUNTER — Encounter (HOSPITAL_COMMUNITY): Payer: Self-pay | Admitting: Radiology

## 2014-08-02 LAB — BASIC METABOLIC PANEL
Anion gap: 6 (ref 5–15)
BUN: 6 mg/dL (ref 6–20)
CALCIUM: 8.2 mg/dL — AB (ref 8.9–10.3)
CO2: 26 mmol/L (ref 22–32)
CREATININE: 0.69 mg/dL (ref 0.61–1.24)
Chloride: 107 mmol/L (ref 101–111)
GFR calc Af Amer: >60 mL/min (ref >60)
Glucose, Bld: 101 mg/dL — ABNORMAL HIGH (ref 65–99)
Potassium: 3.5 mmol/L (ref 3.5–5.1)
Sodium: 139 mmol/L (ref 135–145)

## 2014-08-02 LAB — URINE CULTURE: Culture: NO GROWTH

## 2014-08-02 MED ORDER — IOHEXOL 300 MG/ML  SOLN
100.0000 mL | Freq: Once | INTRAMUSCULAR | Status: AC | PRN
  Administered 2014-08-02: 100 mL via INTRAVENOUS

## 2014-08-02 MED ORDER — IOHEXOL 300 MG/ML  SOLN
25.0000 mL | INTRAMUSCULAR | Status: AC
Start: 2014-08-02 — End: 2014-08-02
  Administered 2014-08-02 (×2): 25 mL via ORAL

## 2014-08-02 NOTE — Progress Notes (Addendum)
Patient Demographics  Nicholas King, is a 44 y.o. male, DOB - 1970/10/29, [id number]  Admit date - 07/31/2014   Admitting Physician Orvan Falconer, MD  Outpatient Primary MD for the patient is [name], [name], MD  LOS - 2   Chief Complaint  Patient presents with  . Fever       Admission HPI/Brief narrative: 44 year old male with history of large B-cell lymphoma, in remission, presents with fever, blood workup significant for leukopenia, workup significant for negative urinalysis, chest x-ray, patient has gram-negative rods bacteremia.  Subjective:   Nicholas King today has, No headache, No chest pain, No abdominal pain - No Nausea, No new weakness tingling or numbness, No Cough - SOB.No recurrence of fever over last 24 hours . Assessment & Plan    Principal Problem:   Fever chills Active Problems:   Diffuse large B cell lymphoma   Cigarette smoker   Leukopenia   Thrombocytopenia   Sepsis  Fever/ gram-negative rods bacteremia - negative urinalysis, negative chest x-ray, treated initially with  doxycycline and Azactam (patient  with penicillin and vancomycin allergy). - Blood cultures growing gram-negative rods , will discontinue doxycycline, continue with Azactam , unclear source of bacteremia , he  report some right upper quadrant pain, left hip pain , will obtain CT abdomen pelvis with IV contrast for further assessment to look for source of infection .  Diffuse large B-cell lymphoma - Discussed with Dr.[name], patient to follow with her as an outpatient  Cigarettes smoking - Counseled to quit  Leukopenia - Actually seems to be improving, asbefore 10 days as an outpatient was 2.7, patient is not neutropenic  Code Status: full  Family Communication: None at bedside  Disposition Plan: Home once stable   Procedures  None   Consults    None   Medications  Scheduled Meds: . aztreonam  1 g Intravenous Q8H  . doxycycline  100 mg Oral Q12H  . enoxaparin (LOVENOX) injection  40 mg Subcutaneous Q24H  . feeding supplement (ENSURE ENLIVE)  237 mL Oral TID BM  . gabapentin  600 mg Oral TID  . levETIRAcetam  250 mg Oral BID  . nicotine  14 mg Transdermal Daily  . sodium chloride  500 mL Intravenous Once  . sodium chloride  3 mL Intravenous Q12H   Continuous Infusions: . sodium chloride 125 mL/hr at 08/01/14 2222   PRN Meds:.oxyCODONE  DVT Prophylaxis  Lovenox -   Lab Results  Component Value Date   PLT 250 08/01/2014    Antibiotics    Anti-infectives    Start     Dose/Rate Route Frequency Ordered Stop   08/01/14 1800  levofloxacin (LEVAQUIN) IVPB 750 mg  Status:  Discontinued     750 mg 100 mL/hr over 90 Minutes Intravenous Every 24 hours 07/31/14 1833 08/01/14 0739   08/01/14 1000  doxycycline (VIBRA-TABS) tablet 100 mg     100 mg Oral Every 12 hours 08/01/14 0739     08/01/14 0200  aztreonam (AZACTAM) 1 g in dextrose 5 % 50 mL IVPB     1 g 100 mL/hr over 30 Minutes Intravenous Every 8 hours 07/31/14 1833     07/31/14 1800  levofloxacin (LEVAQUIN) IVPB 750 mg  750 mg 100 mL/hr over 90 Minutes Intravenous  Once 07/31/14 1745 07/31/14 1936   07/31/14 1800  aztreonam (AZACTAM) 2 g in dextrose 5 % 50 mL IVPB     2 g 100 mL/hr over 30 Minutes Intravenous  Once 07/31/14 1745 07/31/14 1817          Objective:   Filed Vitals:   08/01/14 1500 08/01/14 2104 08/02/14 0154 08/02/14 0448  BP: 136/91 141/89 148/94 148/95  Pulse: 70 66 59 71  Temp: 98.3 F (36.8 C) 98.8 F (37.1 C) 98 F (36.7 C) 98.4 F (36.9 C)  TempSrc: Oral Oral Oral Oral  Resp: 18 20 20 18   Height:      Weight:      SpO2: 100% 100% 100% 100%    Wt Readings from Last 3 Encounters:  07/31/14 51.801 kg (114 lb 3.2 oz)  07/20/14 48.671 kg (107 lb 4.8 oz)  01/19/14 49.941 kg (110 lb 1.6 oz)     Intake/Output Summary (Last  24 hours) at 08/02/14 1051 Last data filed at 08/02/14 0719  Gross per 24 hour  Intake 4047.92 ml  Output   4725 ml  Net -677.08 ml     Physical Exam  Awake Alert, Oriented X 3, No new F.N deficits, Normal affect [place].AT,PERRAL Supple Neck,No JVD, No cervical lymphadenopathy appriciated.  Symmetrical Chest wall movement, Good air movement bilaterally, CTAB RRR,No Gallops,Rubs or new Murmurs, No Parasternal Heave +ve B.Sounds, Abd Soft,  NO tenderness, No rebound - guarding or rigidity. No Cyanosis, Clubbing or edema, No new Rash or bruise     Data Review   Micro Results Recent Results (from the past 240 hour(s))  Culture, blood (routine x 2)     Status: None (Preliminary result)   Collection Time: 07/31/14  4:15 PM  Result Value Ref Range Status   Specimen Description BLOOD LEFT ARM  Final   Special Requests BOTTLES DRAWN AEROBIC AND ANAEROBIC 10CC BOTTLES  Final   Culture   Final    GRAM NEGATIVE RODS Gram Stain Report Called to,Read Back By and Verified With: SCOTTON,J AT 0405 BY HUFFINES,S ON 08/02/14. PATIENT TRANFERED TO [place] Performed at [facility]    Report Status PENDING  Incomplete  Culture, blood (routine x 2)     Status: None (Preliminary result)   Collection Time: 07/31/14  4:17 PM  Result Value Ref Range Status   Specimen Description BLOOD RIGHT ARM  Final   Special Requests BOTTLES DRAWN AEROBIC AND ANAEROBIC 10CC BOTTLES  Final   Culture NO GROWTH 2 DAYS  Final   Report Status PENDING  Incomplete    Radiology Reports Dg Chest 2 View  07/31/2014   CLINICAL DATA:  Cough, congestion, fever.  History of pneumonia.  EXAM: CHEST  2 VIEW  COMPARISON:  01/10/2014  FINDINGS: Lungs are hyperinflated. There is prominence of interstitial markings, likely chronic. There are no focal consolidations or pleural effusions. No pulmonary edema.  Sequelae of L1 compression fracture noted. No further compression identified.  IMPRESSION: 1. Hyperinflation. 2.  No  focal acute pulmonary abnormality.   Electronically Signed   By: Nolon Nations M.D.   On: 07/31/2014 17:05     CBC  Recent Labs Lab 07/31/14 1615 07/31/14 2150 08/01/14 0115 08/01/14 1341  WBC 3.8* 3.7* 3.2* 3.0*  HGB 11.6* 11.0* 11.7* 12.1*  HCT 35.3* 33.9* 35.7* 36.7*  PLT 179 194 189 250  MCV 91.2 92.4 92.2 90.4  MCH 30.0 30.0 30.2 29.8  MCHC 32.9 32.4 32.8 33.0  RDW 13.5 13.7 13.7 13.5  LYMPHSABS 0.5*  --   --  0.8  MONOABS 0.3  --   --  0.3  EOSABS 0.0  --   --  0.0  BASOSABS 0.0  --   --  0.0    Chemistries   Recent Labs Lab 07/31/14 1615 07/31/14 2150 08/01/14 0115 08/02/14 0426  NA 138  --  140 139  K 3.6  --  3.7 3.5  CL 104  --  112* 107  CO2 28  --  23 26  GLUCOSE 102*  --  92 101*  BUN 9  --  <5* 6  CREATININE 0.78 0.69 0.64 0.69  CALCIUM 7.9*  --  6.8* 8.2*  AST 19  --   --   --   ALT 14*  --   --   --   ALKPHOS 43  --   --   --   BILITOT 0.7  --   --   --    ------------------------------------------------------------------------------------------------------------------ estimated creatinine clearance is 87.2 mL/min (by C-G formula based on Cr of 0.69). ------------------------------------------------------------------------------------------------------------------ No results for input(s): HGBA1C in the last 72 hours. ------------------------------------------------------------------------------------------------------------------ No results for input(s): CHOL, HDL, LDLCALC, TRIG, CHOLHDL, LDLDIRECT in the last 72 hours. ------------------------------------------------------------------------------------------------------------------ No results for input(s): TSH, T4TOTAL, T3FREE, THYROIDAB in the last 72 hours.  Invalid input(s): FREET3 ------------------------------------------------------------------------------------------------------------------ No results for input(s): VITAMINB12, FOLATE, FERRITIN, TIBC, IRON, RETICCTPCT in the last 72  hours.  Coagulation profile  Recent Labs Lab 07/31/14 2150  INR 1.31    No results for input(s): DDIMER in the last 72 hours.  Cardiac Enzymes No results for input(s): CKMB, TROPONINI, MYOGLOBIN in the last 168 hours.  Invalid input(s): CK ------------------------------------------------------------------------------------------------------------------ Invalid input(s): POCBNP     Time Spent in minutes   30 minutes   Janis Cuffe M.D on 08/02/2014 at 10:51 AM  Between 7am to 7pm - Pager - [phone]  After 7pm go to [email or web address] - password [facility]  [facility]   Office  [phone]

## 2014-08-02 NOTE — Progress Notes (Signed)
CRITICAL VALUE ALERT  Critical value received:  Positive Blood Culture. In the aerobic bottle,gram negative rods   Date of notification:  08/02/14  Time of notification:  0405  Critical value read back:yes  Nurse who received alert:  Dellie Catholic  MD notified (1st page):Yes     Time of first page:  0408

## 2014-08-02 NOTE — Progress Notes (Signed)
Initial Nutrition Assessment  DOCUMENTATION CODES:  Severe malnutrition in context of chronic illness  INTERVENTION: - Continue Ensure Enlive TID, each supplement provides 350 kcal and 20 grams of protein - RD will continue to monitor for needs  NUTRITION DIAGNOSIS:  Malnutrition related to chronic illness as evidenced by severe depletion of muscle mass, severe depletion of body fat.  GOAL:  Patient will meet greater than or equal to 90% of their needs  MONITOR:  PO intake, Supplement acceptance, Weight trends, Labs, I & O's  REASON FOR ASSESSMENT:  Other (Comment) (underweight BMI)  ASSESSMENT: 44 y.o. male with hx of Stage II large B cell lymphoma, Dx April 2014, s/p R-CHOP (completed 6 cycles) and 4 rounds of intrathecal chemotherapy, with follow up staging showed complete response, hx of chronic back pain seen at the pain clinic on chronic narcotics, hx of PNA, saw his oncologist Dr Ileana Ladd at [facility] June 14/16, and was planned to have re-imaging the following 6 months, having fever, anorexia and weight loss, along with mild leukopenia and thrombocytopenia, presented to the ER as he was having persistent fever and chills. He took his temp yesterday and said it was 103.2. In the ER, he had a Tmax of 101.   Pt seen for underweight BMI. Pt eating 100% of meals. He states that for breakfast this AM he had an omelet and homefries; he also consumed 100% of Ensure Enlive. He likes this supplement but states that [employer] gives him heart burn. Pt states he had a good appetite PTA and that his weight has consistently been around 107 lbs and he was surprised when it was 114 lbs in the ED. He states that at home he does not eat 3 meals/day but he does eat 3 meals/day when he is in the hospital.  Physical assessment indicates severe muscle and fat wasting. Likely meeting needs with meals and supplements. Medications reviewed. Labs reviewed; Ca: 8.2 mg/dL.  Height:  Ht Readings from  Last 1 Encounters:  07/31/14 5\' 7"  (1.702 m)    Weight:  Wt Readings from Last 1 Encounters:  07/31/14 114 lb 3.2 oz (51.801 kg)    Ideal Body Weight:  67.3 kg (kg)  Wt Readings from Last 10 Encounters:  07/31/14 114 lb 3.2 oz (51.801 kg)  07/20/14 107 lb 4.8 oz (48.671 kg)  01/19/14 110 lb 1.6 oz (49.941 kg)  01/10/14 102 lb (46.267 kg)  10/26/13 108 lb (48.988 kg)  10/14/13 108 lb (48.988 kg)  07/20/13 106 lb 8 oz (48.308 kg)  04/30/13 107 lb 6.4 oz (48.716 kg)  02/26/13 105 lb 14.4 oz (48.036 kg)  01/06/13 109 lb (49.442 kg)    BMI:  Body mass index is 17.88 kg/(m^2).  Estimated Nutritional Needs:  Kcal:  2409-7353  Protein:  65-80 grams  Fluid:  2.2-2.5 L/day  Skin:  Reviewed, no issues  Diet Order:  Diet regular Room service appropriate?: Yes; Fluid consistency:: Thin  EDUCATION NEEDS:  No education needs identified at this time   Intake/Output Summary (Last 24 hours) at 08/02/14 1206 Last data filed at 08/02/14 1100  Gross per 24 hour  Intake 4535.42 ml  Output   4925 ml  Net -389.58 ml    Last BM:  PTA    Jarome Matin, RD, LDN Inpatient Clinical Dietitian Pager # [phone] After hours/weekend pager # [phone]

## 2014-08-02 NOTE — Care Management (Signed)
IM LETTER GIVEN TO PATIENT  

## 2014-08-03 DIAGNOSIS — E440 Moderate protein-calorie malnutrition: Secondary | ICD-10-CM | POA: Insufficient documentation

## 2014-08-03 LAB — CBC WITH DIFFERENTIAL/PLATELET
BASOS ABS: 0 10*3/uL (ref 0.0–0.1)
Basophils Relative: 0 % (ref 0–1)
Eosinophils Absolute: 0 10*3/uL (ref 0.0–0.7)
Eosinophils Relative: 1 % (ref 0–5)
HEMATOCRIT: 36.5 % — AB (ref 39.0–52.0)
Hemoglobin: 12.2 g/dL — ABNORMAL LOW (ref 13.0–17.0)
Lymphocytes Relative: 29 % (ref 12–46)
Lymphs Abs: 1.2 10*3/uL (ref 0.7–4.0)
MCH: 29.8 pg (ref 26.0–34.0)
MCHC: 33.4 g/dL (ref 30.0–36.0)
MCV: 89 fL (ref 78.0–100.0)
Monocytes Absolute: 0.4 10*3/uL (ref 0.1–1.0)
Monocytes Relative: 10 % (ref 3–12)
Neutro Abs: 2.5 10*3/uL (ref 1.7–7.7)
Neutrophils Relative %: 60 % (ref 43–77)
PLATELETS: 281 10*3/uL (ref 150–400)
RBC: 4.1 MIL/uL — ABNORMAL LOW (ref 4.22–5.81)
RDW: 13.5 % (ref 11.5–15.5)
WBC: 4.1 10*3/uL (ref 4.0–10.5)

## 2014-08-03 LAB — BASIC METABOLIC PANEL
ANION GAP: 8 (ref 5–15)
BUN: 7 mg/dL (ref 6–20)
CALCIUM: 8.4 mg/dL — AB (ref 8.9–10.3)
CO2: 26 mmol/L (ref 22–32)
CREATININE: 0.62 mg/dL (ref 0.61–1.24)
Chloride: 108 mmol/L (ref 101–111)
GFR calc non Af Amer: >60 mL/min (ref >60)
Glucose, Bld: 96 mg/dL (ref 65–99)
POTASSIUM: 3.7 mmol/L (ref 3.5–5.1)
SODIUM: 142 mmol/L (ref 135–145)

## 2014-08-03 NOTE — Progress Notes (Signed)
Patient left the unit without orders and consent, to leave the unit. Pt became upset as I told him that safety is first and that I have to make sure he is safe. Pt had pain meds about 1 hr and 11mins ago and I told him he would not be able to leave the unit. Pt again became agitated, cursing and stated his MD told him he could go off the floor.. I told him I do not have ordered from the MD for him to leave the unit for any reason. He attempted to report Probation officer to the Medicare and Medicaid agency and stated "I will not longer work here when he finishes with my case". I told the patient again that safety is first and I have to ensure he is safe. He has IV fluids infusing and pain meds that were recently adminstered. I do not think the patient is safe at the present time to leave the unit unsupervised. SRP, RN, BSN.

## 2014-08-03 NOTE — Progress Notes (Signed)
ANTIBIOTIC CONSULT NOTE  Pharmacy Consult for Aztreonam Indication: rule out sepsis/GNR bacteremia  Allergies  Allergen Reactions  . Vancomycin Itching and Rash    Extreme Itching, red skin from head to torso.    Marland Kitchen Cymbalta [Duloxetine Hcl] Other (See Comments)    Suicidal and "crazy"  . Penicillins Hives  . Amoxicillin Rash  . Hydrocodone Rash    Patient Measurements: Height: 5\' 7"  (170.2 cm) Weight: 114 lb 3.2 oz (51.801 kg) IBW/kg (Calculated) : 66.1  Vital Signs: Temp: 98.2 F (36.8 C) (06/28 0505) Temp Source: Oral (06/28 0505) BP: 122/80 mmHg (06/28 0505) Pulse Rate: 59 (06/28 0505) Intake/Output from previous day: 06/27 0701 - 06/28 0700 In: 1197.5 [I.V.:1097.5; IV Piggyback:100] Out: 3775 [Urine:3775] Intake/Output from this shift:    Labs:  Recent Labs  08/01/14 0115 08/01/14 1341 08/02/14 0426 08/03/14 0445  WBC 3.2* 3.0*  --  4.1  HGB 11.7* 12.1*  --  12.2*  PLT 189 250  --  281  CREATININE 0.64  --  0.69 0.62   Estimated Creatinine Clearance: 87.2 mL/min (by C-G formula based on Cr of 0.62). No results for input(s): VANCOTROUGH, VANCOPEAK, VANCORANDOM, GENTTROUGH, GENTPEAK, GENTRANDOM, TOBRATROUGH, TOBRAPEAK, TOBRARND, AMIKACINPEAK, AMIKACINTROU, AMIKACIN in the last 72 hours.   Microbiology: Recent Results (from the past 720 hour(s))  Urine culture     Status: None   Collection Time: 07/31/14  4:12 PM  Result Value Ref Range Status   Specimen Description URINE, CLEAN CATCH  Final   Special Requests NONE  Final   Culture   Final    NO GROWTH 1 DAY Performed at [facility]    Report Status 08/02/2014 FINAL  Final  Culture, blood (routine x 2)     Status: None (Preliminary result)   Collection Time: 07/31/14  4:15 PM  Result Value Ref Range Status   Specimen Description BLOOD LEFT ARM  Final   Special Requests BOTTLES DRAWN AEROBIC AND ANAEROBIC 10CC BOTTLES  Final   Culture   Final    GRAM NEGATIVE RODS Gram Stain Report Called  to,Read Back By and Verified With: SCOTTON,J AT 4098 BY HUFFINES,S ON 08/02/14. PATIENT TRANFERED TO [place] Performed at [facility]    Report Status PENDING  Incomplete  Culture, blood (routine x 2)     Status: None (Preliminary result)   Collection Time: 07/31/14  4:17 PM  Result Value Ref Range Status   Specimen Description BLOOD RIGHT ARM  Final   Special Requests BOTTLES DRAWN AEROBIC AND ANAEROBIC 10CC BOTTLES  Final   Culture NO GROWTH 2 DAYS  Final   Report Status PENDING  Incomplete    Anti-infectives    Start     Dose/Rate Route Frequency Ordered Stop   08/01/14 1800  levofloxacin (LEVAQUIN) IVPB 750 mg  Status:  Discontinued     750 mg 100 mL/hr over 90 Minutes Intravenous Every 24 hours 07/31/14 1833 08/01/14 0739   08/01/14 1000  doxycycline (VIBRA-TABS) tablet 100 mg  Status:  Discontinued     100 mg Oral Every 12 hours 08/01/14 0739 08/02/14 1057   08/01/14 0200  aztreonam (AZACTAM) 1 g in dextrose 5 % 50 mL IVPB     1 g 100 mL/hr over 30 Minutes Intravenous Every 8 hours 07/31/14 1833     07/31/14 1800  levofloxacin (LEVAQUIN) IVPB 750 mg     750 mg 100 mL/hr over 90 Minutes Intravenous  Once 07/31/14 1745 07/31/14 1936   07/31/14 1800  aztreonam (AZACTAM) 2 g in dextrose 5 % 50 mL IVPB     2 g 100 mL/hr over 30 Minutes Intravenous  Once 07/31/14 1745 07/31/14 1817      Assessment: 44 yo M with hx lymphoma s/p chemotherapy presents with fever (Tm 101.85F) and fatigue.  UA and CXR negative.   Multiple antibiotic allergies noted including PCN and Vancomycin, however he has tolerated cephalosporins in the past.  Orders for pharmacy to dose aztreonam.  6/25 >> Levaquin >> 6/26 6/25 >> Aztreonam >>   6/26 >> Doxycycline >> 6/27  6/25 blood x2: GNR 1/2 6/25 urine: NGTD  WBC improved, afebrile  Goal of Therapy:  Eradicate infection.  Plan:  Day #4 aztreonam  Continue Aztreonam 1gm IV q8h  Monitor renal function and cx data   NOTE patient has  tolerated cephalosporins in past, consider modifying cultures with final culture  Doreene Eland, PharmD, BCPS.   Pager: [phone] 08/03/2014,9:27 AM

## 2014-08-03 NOTE — Progress Notes (Signed)
Patient Demographics  Nicholas King, is a 44 y.o. male, DOB - [birth year], RXV:[id number]  Admit date - 07/31/2014   Admitting Physician Orvan Falconer, MD  Outpatient Primary MD for the patient is [name], Nicholas Luna, MD  LOS - 3   Chief Complaint  Patient presents with  . Fever       Admission HPI/Brief narrative: 44 year old male with history of large B-cell lymphoma, in remission, presents with fever, blood workup significant for leukopenia, workup significant for negative urinalysis, chest x-ray, patient has gram-negative rods bacteremia.  Subjective:   [name] today has, No headache, No chest pain, No abdominal pain - No Nausea, No new weakness tingling or numbness, No Cough - SOB.No recurrence of fever .  Assessment & Plan    Principal Problem:   Fever chills Active Problems:   Diffuse large B cell lymphoma   Cigarette smoker   Leukopenia   Thrombocytopenia   Sepsis   Malnutrition of moderate degree  Fever/ gram-negative rods bacteremia - negative urinalysis, negative chest x-ray, treated initially with  doxycycline and Azactam (patient  with penicillin and vancomycin allergy). - Blood cultures growing gram-negative rods , stopped doxycycline 6/28 , continue with Azactam , unclear source of bacteremia , negative urinalysis, negative chest x-ray, patient is edentulous, Port-A-Cath was removed more than one year ago, ET abdomen and pelvis with IV contrast done with no acute finding to suggest source of bacteremia. - Discussed with infectious disease Dr. [name], patient will need at least 5 days of IV antibiotics, then 2 days of oral antibiotics as an outpatient (it will depends on the results of species and sensitivity), blood culture repeated 08/03/14.  Diffuse large B-cell lymphoma - Discussed with Dr.[name], patient to follow with her as an outpatient, no workup needed as an  inpatient.  Cigarettes smoking - Counseled to quit  Leukopenia - Actually seems to be improving, as before 10 days as an outpatient was 2.7, patient is not neutropenic  Code Status: full  Family Communication: None at bedside  Disposition Plan: Home once stable(will need another 2 days of IV antibiotics at least)   Procedures  None   Consults   None   Medications  Scheduled Meds: . aztreonam  1 g Intravenous Q8H  . enoxaparin (LOVENOX) injection  40 mg Subcutaneous Q24H  . feeding supplement (ENSURE ENLIVE)  237 mL Oral TID BM  . gabapentin  600 mg Oral TID  . levETIRAcetam  250 mg Oral BID  . nicotine  14 mg Transdermal Daily  . sodium chloride  500 mL Intravenous Once  . sodium chloride  3 mL Intravenous Q12H   Continuous Infusions: . sodium chloride 75 mL/hr at 08/02/14 2108   PRN Meds:.oxyCODONE  DVT Prophylaxis  Lovenox -   Lab Results  Component Value Date   PLT 281 08/03/2014    Antibiotics    Anti-infectives    Start     Dose/Rate Route Frequency Ordered Stop   08/01/14 1800  levofloxacin (LEVAQUIN) IVPB 750 mg  Status:  Discontinued     750 mg 100 mL/hr over 90 Minutes Intravenous Every 24 hours 07/31/14 1833 08/01/14 0739   08/01/14 1000  doxycycline (VIBRA-TABS) tablet 100 mg  Status:  Discontinued  100 mg Oral Every 12 hours 08/01/14 0739 08/02/14 1057   08/01/14 0200  aztreonam (AZACTAM) 1 g in dextrose 5 % 50 mL IVPB     1 g 100 mL/hr over 30 Minutes Intravenous Every 8 hours 07/31/14 1833     07/31/14 1800  levofloxacin (LEVAQUIN) IVPB 750 mg     750 mg 100 mL/hr over 90 Minutes Intravenous  Once 07/31/14 1745 07/31/14 1936   07/31/14 1800  aztreonam (AZACTAM) 2 g in dextrose 5 % 50 mL IVPB     2 g 100 mL/hr over 30 Minutes Intravenous  Once 07/31/14 1745 07/31/14 1817          Objective:   Filed Vitals:   08/02/14 0448 08/02/14 1900 08/02/14 2041 08/03/14 0505  BP: 148/95 136/99 139/98 122/80  Pulse: 71 72 72 59  Temp:  98.4 F (36.9 C) 98 F (36.7 C) 98.9 F (37.2 C) 98.2 F (36.8 C)  TempSrc: Oral Oral Oral Oral  Resp: 18 18 20 18   Height:      Weight:      SpO2: 100% 100% 99% 100%    Wt Readings from Last 3 Encounters:  07/31/14 51.801 kg (114 lb 3.2 oz)  07/20/14 48.671 kg (107 lb 4.8 oz)  01/19/14 49.941 kg (110 lb 1.6 oz)     Intake/Output Summary (Last 24 hours) at 08/03/14 1225 Last data filed at 08/03/14 1001  Gross per 24 hour  Intake   1140 ml  Output   3025 ml  Net  -1885 ml     Physical Exam  Awake Alert, Oriented X 3, No new F.N deficits, Normal affect [place].AT,PERRAL Supple Neck,No JVD, No cervical lymphadenopathy appriciated.  Symmetrical Chest wall movement, Good air movement bilaterally, CTAB RRR,No Gallops,Rubs or new Murmurs, No Parasternal Heave +ve B.Sounds, Abd Soft,  NO tenderness, No rebound - guarding or rigidity. No Cyanosis, Clubbing or edema, No new Rash or bruise     Data Review   Micro Results Recent Results (from the past 240 hour(s))  Urine culture     Status: None   Collection Time: 07/31/14  4:12 PM  Result Value Ref Range Status   Specimen Description URINE, CLEAN CATCH  Final   Special Requests NONE  Final   Culture   Final    NO GROWTH 1 DAY Performed at [facility]    Report Status 08/02/2014 FINAL  Final  Culture, blood (routine x 2)     Status: None (Preliminary result)   Collection Time: 07/31/14  4:15 PM  Result Value Ref Range Status   Specimen Description BLOOD LEFT ARM  Final   Special Requests BOTTLES DRAWN AEROBIC AND ANAEROBIC 10CC BOTTLES  Final   Culture  Setup Time   Final    GRAM NEGATIVE RODS CRITICAL RESULT CALLED TO, READ BACK BY AND VERIFIED WITH: SCOTTON J AT 0405 BY HUFFINES S ON 08/02/14. PATIENT TRANSFERRED TO [place] Performed at [facility]    Culture   Final    [place] Performed at [facility]    Report Status PENDING  Incomplete  Culture, blood (routine x 2)      Status: None (Preliminary result)   Collection Time: 07/31/14  4:17 PM  Result Value Ref Range Status   Specimen Description BLOOD RIGHT ARM  Final   Special Requests BOTTLES DRAWN AEROBIC AND ANAEROBIC 10CC BOTTLES  Final   Culture NO GROWTH 3 DAYS  Final   Report Status PENDING  Incomplete    Radiology Reports Dg Chest 2 View  07/31/2014   CLINICAL DATA:  Cough, congestion, fever.  History of pneumonia.  EXAM: CHEST  2 VIEW  COMPARISON:  01/10/2014  FINDINGS: Lungs are hyperinflated. There is prominence of interstitial markings, likely chronic. There are no focal consolidations or pleural effusions. No pulmonary edema.  Sequelae of L1 compression fracture noted. No further compression identified.  IMPRESSION: 1. Hyperinflation. 2.  No focal acute pulmonary abnormality.   Electronically Signed   By: Nolon Nations M.D.   On: 07/31/2014 17:05   Ct Abdomen Pelvis W Contrast  08/02/2014   CLINICAL DATA:  Right upper quadrant pain, gram negative rod bacteremia, history of lymphoma  EXAM: CT ABDOMEN AND PELVIS WITH CONTRAST  TECHNIQUE: Multidetector CT imaging of the abdomen and pelvis was performed using the standard protocol following bolus administration of intravenous contrast.  CONTRAST:  133mL OMNIPAQUE IOHEXOL 300 MG/ML  SOLN  COMPARISON:  01/10/2014  FINDINGS: Lower chest: Small bilateral pleural effusions, right greater than left. Associated minimal dependent atelectasis.  Hepatobiliary: Liver is within normal limits. No suspicious/enhancing hepatic lesions.  Gallbladder is decompressed. Mild gallbladder wall thickening/ edema.  Pancreas: Within normal limits.  Spleen: Spleen is mildly enlarged, measuring 14.2 cm in maximal dimension.  Adrenals/Urinary Tract: Adrenal glands are within normal limits.  Kidneys are within normal limits. Bilateral extrarenal pelvis. No hydronephrosis.  Bladder is within normal limits.  Stomach/Bowel: Stomach is within normal limits.  No evidence of bowel  obstruction.  Normal appendix.  Vascular/Lymphatic: No evidence of abdominal aortic aneurysm.  No suspicious abdominopelvic lymphadenopathy.  Reproductive: Prostate is unremarkable.  Other: No suspicious abdominopelvic lymphadenopathy.  Musculoskeletal: Mild superior endplate compression fracture deformity at L1, chronic.  Visualized thoracolumbar spine is otherwise within normal limits.  IMPRESSION: No evidence of bowel obstruction.  Normal appendix.  No suspicious lymphadenopathy in the abdomen/pelvis.  Mild splenomegaly in this patient with known lymphoma.  Small bilateral pleural effusions. Associated minimal dependent atelectasis at the lung bases.   Electronically Signed   By: Julian Hy M.D.   On: 08/02/2014 15:09     CBC  Recent Labs Lab 07/31/14 1615 07/31/14 2150 08/01/14 0115 08/01/14 1341 08/03/14 0445  WBC 3.8* 3.7* 3.2* 3.0* 4.1  HGB 11.6* 11.0* 11.7* 12.1* 12.2*  HCT 35.3* 33.9* 35.7* 36.7* 36.5*  PLT 179 194 189 250 281  MCV 91.2 92.4 92.2 90.4 89.0  MCH 30.0 30.0 30.2 29.8 29.8  MCHC 32.9 32.4 32.8 33.0 33.4  RDW 13.5 13.7 13.7 13.5 13.5  LYMPHSABS 0.5*  --   --  0.8 1.2  MONOABS 0.3  --   --  0.3 0.4  EOSABS 0.0  --   --  0.0 0.0  BASOSABS 0.0  --   --  0.0 0.0    Chemistries   Recent Labs Lab 07/31/14 1615 07/31/14 2150 08/01/14 0115 08/02/14 0426 08/03/14 0445  NA 138  --  140 139 142  K 3.6  --  3.7 3.5 3.7  CL 104  --  112* 107 108  CO2 28  --  23 26 26   GLUCOSE 102*  --  92 101* 96  BUN 9  --  <5* 6 7  CREATININE 0.78 0.69 0.64 0.69 0.62  CALCIUM 7.9*  --  6.8* 8.2* 8.4*  AST 19  --   --   --   --   ALT 14*  --   --   --   --   [facility]  43  --   --   --   --   BILITOT 0.7  --   --   --   --    ------------------------------------------------------------------------------------------------------------------ estimated creatinine clearance is 87.2 mL/min (by C-G formula based on Cr of  0.62). ------------------------------------------------------------------------------------------------------------------ No results for input(s): HGBA1C in the last 72 hours. ------------------------------------------------------------------------------------------------------------------ No results for input(s): CHOL, HDL, LDLCALC, TRIG, CHOLHDL, LDLDIRECT in the last 72 hours. ------------------------------------------------------------------------------------------------------------------ No results for input(s): TSH, T4TOTAL, T3FREE, THYROIDAB in the last 72 hours.  Invalid input(s): FREET3 ------------------------------------------------------------------------------------------------------------------ No results for input(s): VITAMINB12, FOLATE, FERRITIN, TIBC, IRON, RETICCTPCT in the last 72 hours.  Coagulation profile  Recent Labs Lab 07/31/14 2150  INR 1.31    No results for input(s): DDIMER in the last 72 hours.  Cardiac Enzymes No results for input(s): CKMB, TROPONINI, MYOGLOBIN in the last 168 hours.  Invalid input(s): CK ------------------------------------------------------------------------------------------------------------------ Invalid input(s): POCBNP     Time Spent in minutes   30 minutes   ELGERGAWY, DAWOOD M.D on 08/03/2014 at 12:25 PM  Between 7am to 7pm - Pager - [phone]  After 7pm go to [email or web address] - password [facility]  [facility]   Office  [phone]

## 2014-08-04 DIAGNOSIS — R509 Fever, unspecified: Secondary | ICD-10-CM

## 2014-08-04 DIAGNOSIS — C8330 Diffuse large B-cell lymphoma, unspecified site: Secondary | ICD-10-CM

## 2014-08-04 DIAGNOSIS — Z720 Tobacco use: Secondary | ICD-10-CM

## 2014-08-04 DIAGNOSIS — D72819 Decreased white blood cell count, unspecified: Secondary | ICD-10-CM

## 2014-08-04 MED ORDER — LEVOFLOXACIN IN D5W 750 MG/150ML IV SOLN
750.0000 mg | INTRAVENOUS | Status: DC
  Administered 2014-08-04: 750 mg via INTRAVENOUS
  Filled 2014-08-04: qty 150

## 2014-08-04 MED ORDER — SACCHAROMYCES BOULARDII 250 MG PO CAPS
250.0000 mg | ORAL_CAPSULE | Freq: Two times a day (BID) | ORAL | Status: DC
  Administered 2014-08-04 – 2014-08-05 (×3): 250 mg via ORAL
  Filled 2014-08-04 (×3): qty 1

## 2014-08-04 NOTE — Progress Notes (Signed)
[facility]                                                                              Patient Demographics  Nicholas King, is a 44 y.o. male, DOB - 1970/09/07, TGG:[id number]  Admit date - 07/31/2014   Admitting Physician Orvan Falconer, MD  Outpatient Primary MD for the patient is [name], [name], MD  LOS - 4   Chief Complaint  Patient presents with  . Fever      HPI on 07/31/2014 by Dr. Orvan Falconer Nicholas King is an 44 y.o. male with hx of Stage II large B cell lymphoma, Dx April 2014, s/p R-CHOP (completed 6 cycles) and 4 rounds of intrathecal chemotherapy, with follow up staging showed complete response, hx of chronic back pain seen at the pain clinic on chronic narcotics, hx of PNA, saw his oncologist Dr Ileana Ladd at [facility] June 14/16, and was planned to have re-imaging the following 6 months, having fever, anorexia and weight loss, along with mild leukopenia and thrombocytopenia, presented to the ER as he was having persistent fever and chills. He took his temp yesterday and said it was 103.2. In the ER, he had a Tmax of 101. He denied SOB, HA, nausea, vomiting, abdominal pain, diarrhea, sorethroat, coughs, or any skin rash. Evalaution in the ER showed mild leukopenia with WBC of 3.8K ( 2.7K 11 days ago), Platelet count of179K (136K 11 days ago), negative UA, and clear CXR. He has an allergy to PCN and Vancomycin as far as antibiotics go, and he was given IV Azactam and IV Levoquin. He was cultured. He was hypotensive, but tolerated quite well, and his lactic acid was normal.   Assessment & Plan   Fever/ Stenotrophomonas bacteremia -UA and CXR neg for infection -Initially treated with doxycyclin and Azactam (patient has PCN and vanc allergy) -Blood cultures from 07/31/2014: GNR --> stenotrophomonas maltophilia, sensitive to levaquin, bactrim -Spoke with Dr. [name], ID, who suggested 5 days IV antibiotics, followed by 2 weeks antibiotics- levaquin -Will start patient  on IV levaquin today and discontinue aztreonam -Repeat blood cultures from 08/03/2014 pending  Diffuse large B-cell lymphoma -Previous hospitalist discussed with Dr.[name], patient to follow with her as an outpatient, no workup needed as an inpatient.  Cigarettes smoking -Counseled to quit -continue nicotine patch  Leukopenia -improving, was 2.7 as an outpatient, currently 4.1   Code Status: Full  Family Communication: None at bedside  Disposition Plan: Admitted. Likely discharge to home on 08/05/2014  Time Spent in minutes   30 minutes  Procedures  None  Consults   ID, Dr [name], via phone Oncology, Dr. Alvy Bimler, by previous hospitalist  DVT Prophylaxis  Lovenox  Lab Results  Component Value Date   PLT 281 08/03/2014    Medications  Scheduled Meds: . aztreonam  1 g Intravenous Q8H  . enoxaparin (LOVENOX) injection  40 mg Subcutaneous Q24H  . feeding supplement (ENSURE ENLIVE)  237 mL Oral TID BM  . gabapentin  600 mg Oral TID  . levETIRAcetam  250 mg Oral BID  . nicotine  14 mg Transdermal Daily  . sodium chloride  500 mL Intravenous Once  . sodium chloride  3 mL Intravenous Q12H   Continuous Infusions: . sodium chloride 75 mL/hr at 08/04/14 0038   PRN Meds:.oxyCODONE  Antibiotics    Anti-infectives    Start     Dose/Rate Route Frequency Ordered Stop   08/01/14 1800  levofloxacin (LEVAQUIN) IVPB 750 mg  Status:  Discontinued     750 mg 100 mL/hr over 90 Minutes Intravenous Every 24 hours 07/31/14 1833 08/01/14 0739   08/01/14 1000  doxycycline (VIBRA-TABS) tablet 100 mg  Status:  Discontinued     100 mg Oral Every 12 hours 08/01/14 0739 08/02/14 1057   08/01/14 0200  aztreonam (AZACTAM) 1 g in dextrose 5 % 50 mL IVPB     1 g 100 mL/hr over 30 Minutes Intravenous Every 8 hours 07/31/14 1833     07/31/14 1800  levofloxacin (LEVAQUIN) IVPB 750 mg     750 mg 100 mL/hr over 90 Minutes Intravenous  Once 07/31/14 1745 07/31/14 1936   07/31/14 1800   aztreonam (AZACTAM) 2 g in dextrose 5 % 50 mL IVPB     2 g 100 mL/hr over 30 Minutes Intravenous  Once 07/31/14 1745 07/31/14 1817        Subjective:   Nicholas King seen and examined today.  Patient has no complaints.  He would like to drink a [place].  Denies chest pain, shortness of breath, abdominal pain, nausea, vomiting, diarrhea, constipation.    Objective:   Filed Vitals:   08/03/14 0505 08/03/14 1459 08/03/14 2055 08/04/14 0549  BP: 122/80 123/76 130/95 124/83  Pulse: 59 80 88 64  Temp: 98.2 F (36.8 C) 98.5 F (36.9 C) 98.6 F (37 C) 97.8 F (36.6 C)  TempSrc: Oral Oral Oral Oral  Resp: 18 20 20 20   Height:      Weight:      SpO2: 100% 100% 100% 100%    Wt Readings from Last 3 Encounters:  07/31/14 51.801 kg (114 lb 3.2 oz)  07/20/14 48.671 kg (107 lb 4.8 oz)  01/19/14 49.941 kg (110 lb 1.6 oz)     Intake/Output Summary (Last 24 hours) at 08/04/14 1214 Last data filed at 08/04/14 1213  Gross per 24 hour  Intake   3265 ml  Output   3525 ml  Net   -260 ml    Exam  General: Well developed, thin, NAD, appears stated age  HEENT: NCAT, mucous membranes moist.   Cardiovascular: S1 S2 auscultated, no rubs, murmurs or gallops. Regular rate and rhythm.  Respiratory: Clear to auscultation bilaterally with equal chest rise  Abdomen: Soft, nontender, nondistended, + bowel sounds  Extremities: warm dry without cyanosis clubbing or edema  Neuro: AAOx3, nonfocal  Psych: Normal affect and demeanor with intact judgement and insight  Data Review   Micro Results Recent Results (from the past 240 hour(s))  Urine culture     Status: None   Collection Time: 07/31/14  4:12 PM  Result Value Ref Range Status   Specimen Description URINE, CLEAN CATCH  Final   Special Requests NONE  Final   Culture   Final    NO GROWTH 1 DAY Performed at [facility]    Report Status 08/02/2014 FINAL  Final  Culture, blood (routine x 2)     Status: None (Preliminary  result)   Collection Time: 07/31/14  4:15 PM  Result Value Ref Range Status   Specimen Description BLOOD LEFT ARM  Final   Special Requests BOTTLES DRAWN AEROBIC AND ANAEROBIC 10CC BOTTLES  Final  Culture  Setup Time   Final    GRAM NEGATIVE RODS CRITICAL RESULT CALLED TO, READ BACK BY AND VERIFIED WITH: SCOTTON J AT 0405 BY HUFFINES S ON 08/02/14. PATIENT TRANSFERRED TO [place] Performed at [place] Performed at [facility]    Report Status PENDING  Incomplete   Organism ID, Bacteria STENOTROPHOMONAS MALTOPHILIA  Final      Susceptibility   Stenotrophomonas maltophilia - MIC*    LEVOFLOXACIN 0.5 SENSITIVE Sensitive     TRIMETH/SULFA <=20 SENSITIVE Sensitive     * STENOTROPHOMONAS MALTOPHILIA  Culture, blood (routine x 2)     Status: None (Preliminary result)   Collection Time: 07/31/14  4:17 PM  Result Value Ref Range Status   Specimen Description BLOOD RIGHT ARM  Final   Special Requests BOTTLES DRAWN AEROBIC AND ANAEROBIC 10CC BOTTLES  Final   Culture NO GROWTH 4 DAYS  Final   Report Status PENDING  Incomplete    Radiology Reports Dg Chest 2 View  07/31/2014   CLINICAL DATA:  Cough, congestion, fever.  History of pneumonia.  EXAM: CHEST  2 VIEW  COMPARISON:  01/10/2014  FINDINGS: Lungs are hyperinflated. There is prominence of interstitial markings, likely chronic. There are no focal consolidations or pleural effusions. No pulmonary edema.  Sequelae of L1 compression fracture noted. No further compression identified.  IMPRESSION: 1. Hyperinflation. 2.  No focal acute pulmonary abnormality.   Electronically Signed   By: Nolon Nations M.D.   On: 07/31/2014 17:05   Ct Abdomen Pelvis W Contrast  08/02/2014   CLINICAL DATA:  Right upper quadrant pain, gram negative rod bacteremia, history of lymphoma  EXAM: CT ABDOMEN AND PELVIS WITH CONTRAST  TECHNIQUE: Multidetector CT imaging of the abdomen and pelvis was  performed using the standard protocol following bolus administration of intravenous contrast.  CONTRAST:  174mL OMNIPAQUE IOHEXOL 300 MG/ML  SOLN  COMPARISON:  01/10/2014  FINDINGS: Lower chest: Small bilateral pleural effusions, right greater than left. Associated minimal dependent atelectasis.  Hepatobiliary: Liver is within normal limits. No suspicious/enhancing hepatic lesions.  Gallbladder is decompressed. Mild gallbladder wall thickening/ edema.  Pancreas: Within normal limits.  Spleen: Spleen is mildly enlarged, measuring 14.2 cm in maximal dimension.  Adrenals/Urinary Tract: Adrenal glands are within normal limits.  Kidneys are within normal limits. Bilateral extrarenal pelvis. No hydronephrosis.  Bladder is within normal limits.  Stomach/Bowel: Stomach is within normal limits.  No evidence of bowel obstruction.  Normal appendix.  Vascular/Lymphatic: No evidence of abdominal aortic aneurysm.  No suspicious abdominopelvic lymphadenopathy.  Reproductive: Prostate is unremarkable.  Other: No suspicious abdominopelvic lymphadenopathy.  Musculoskeletal: Mild superior endplate compression fracture deformity at L1, chronic.  Visualized thoracolumbar spine is otherwise within normal limits.  IMPRESSION: No evidence of bowel obstruction.  Normal appendix.  No suspicious lymphadenopathy in the abdomen/pelvis.  Mild splenomegaly in this patient with known lymphoma.  Small bilateral pleural effusions. Associated minimal dependent atelectasis at the lung bases.   Electronically Signed   By: Julian Hy M.D.   On: 08/02/2014 15:09    CBC  Recent Labs Lab 07/31/14 1615 07/31/14 2150 08/01/14 0115 08/01/14 1341 08/03/14 0445  WBC 3.8* 3.7* 3.2* 3.0* 4.1  HGB 11.6* 11.0* 11.7* 12.1* 12.2*  HCT 35.3* 33.9* 35.7* 36.7* 36.5*  PLT 179 194 189 250 281  MCV 91.2 92.4 92.2 90.4 89.0  MCH 30.0 30.0 30.2 29.8 29.8  MCHC 32.9 32.4 32.8  33.0 33.4  RDW 13.5 13.7 13.7 13.5 13.5  LYMPHSABS 0.5*  --   --  0.8 1.2   MONOABS 0.3  --   --  0.3 0.4  EOSABS 0.0  --   --  0.0 0.0  BASOSABS 0.0  --   --  0.0 0.0    Chemistries   Recent Labs Lab 07/31/14 1615 07/31/14 2150 08/01/14 0115 08/02/14 0426 08/03/14 0445  NA 138  --  140 139 142  K 3.6  --  3.7 3.5 3.7  CL 104  --  112* 107 108  CO2 28  --  23 26 26   GLUCOSE 102*  --  92 101* 96  BUN 9  --  <5* 6 7  CREATININE 0.78 0.69 0.64 0.69 0.62  CALCIUM 7.9*  --  6.8* 8.2* 8.4*  AST 19  --   --   --   --   ALT 14*  --   --   --   --   ALKPHOS 43  --   --   --   --   BILITOT 0.7  --   --   --   --    ------------------------------------------------------------------------------------------------------------------ estimated creatinine clearance is 87.2 mL/min (by C-G formula based on Cr of 0.62). ------------------------------------------------------------------------------------------------------------------ No results for input(s): HGBA1C in the last 72 hours. ------------------------------------------------------------------------------------------------------------------ No results for input(s): CHOL, HDL, LDLCALC, TRIG, CHOLHDL, LDLDIRECT in the last 72 hours. ------------------------------------------------------------------------------------------------------------------ No results for input(s): TSH, T4TOTAL, T3FREE, THYROIDAB in the last 72 hours.  Invalid input(s): FREET3 ------------------------------------------------------------------------------------------------------------------ No results for input(s): VITAMINB12, FOLATE, FERRITIN, TIBC, IRON, RETICCTPCT in the last 72 hours.  Coagulation profile  Recent Labs Lab 07/31/14 2150  INR 1.31    No results for input(s): DDIMER in the last 72 hours.  Cardiac Enzymes No results for input(s): CKMB, TROPONINI, MYOGLOBIN in the last 168 hours.  Invalid input(s):  CK ------------------------------------------------------------------------------------------------------------------ Invalid input(s): POCBNP    Verneal Wiers D.O. on 08/04/2014 at 12:14 PM  Between 7am to 7pm - Pager - [phone]  After 7pm go to [email or web address] - password [email or web address]  And look for the night coverage person covering for me after hours  [facility] Office  [phone]

## 2014-08-04 NOTE — Care Management Note (Signed)
Case Management Note  Patient Details  Name: Nicholas King MRN: [id number] Date of Birth: [birth year]  Subjective/Objective:    Pt admitted with fever                Action/Plan:  Plan to discharge home   Expected Discharge Date:  08/02/14               Expected Discharge Plan:  Home/Self Care  In-House Referral:     Discharge planning Services  CM Consult  Post Acute Care Choice:    Choice offered to:     DME Arranged:    DME Agency:     HH Arranged:    [place] Agency:     Status of Service:  In process, will continue to follow  Medicare Important Message Given:  Yes-second notification given Date Medicare IM Given:    Medicare IM give by:    Date Additional Medicare IM Given:    Additional Medicare Important Message give by:     If discussed at [place] of Stay Meetings, dates discussed:    Additional CommentsPurcell Mouton, RN 08/04/2014, 3:54 PM

## 2014-08-05 DIAGNOSIS — E41 Nutritional marasmus: Secondary | ICD-10-CM

## 2014-08-05 LAB — CULTURE, BLOOD (ROUTINE X 2): Culture: NO GROWTH

## 2014-08-05 MED ORDER — LEVOFLOXACIN 750 MG PO TABS: 750.0000 mg | ORAL_TABLET | Freq: Every day | ORAL | Status: DC

## 2014-08-05 MED ORDER — NICOTINE 14 MG/24HR TD PT24: 14.0000 mg | MEDICATED_PATCH | Freq: Every day | TRANSDERMAL | Status: DC

## 2014-08-05 MED ORDER — ENSURE ENLIVE PO LIQD: 237.0000 mL | Freq: Three times a day (TID) | ORAL | Status: DC

## 2014-08-05 MED ORDER — SACCHAROMYCES BOULARDII 250 MG PO CAPS: 250.0000 mg | ORAL_CAPSULE | Freq: Two times a day (BID) | ORAL | Status: DC

## 2014-08-05 NOTE — Discharge Summary (Signed)
Physician Discharge Summary  Nicholas King BSW:[id number] DOB: October 19, 1970 DOA: 07/31/2014  PCP: Janalee Dane, MD  Admit date: 07/31/2014 Discharge date: 08/05/2014  Time spent: 45 minutes  Recommendations for Outpatient Follow-up:  Patient will be discharged to home.  Patient will need to follow up with primary care provider within one week of discharge.  Patient should continue medications as prescribed.  Patient should follow a regular diet.   Discharge Diagnoses:  Fever/Stenotrophomonas bacteremia Diffuse large B cell lymphoma Tobacco abuse Leukopenia Severe malnutrition  Discharge Condition: Stable  Diet recommendation: Regular  Filed Weights   07/31/14 1554 07/31/14 2122  Weight: 48.535 kg (107 lb) 51.801 kg (114 lb 3.2 oz)    History of present illness:  on 07/31/2014 by Dr. Orvan Falconer Nicholas King is an 44 y.o. male with hx of Stage II large B cell lymphoma, Dx April 2014, s/p R-CHOP (completed 6 cycles) and 4 rounds of intrathecal chemotherapy, with follow up staging showed complete response, hx of chronic back pain seen at the pain clinic on chronic narcotics, hx of PNA, saw his oncologist Dr Ileana Ladd at [facility] June 14/16, and was planned to have re-imaging the following 6 months, having fever, anorexia and weight loss, along with mild leukopenia and thrombocytopenia, presented to the ER as he was having persistent fever and chills. He took his temp yesterday and said it was 103.2. In the ER, he had a Tmax of 101. He denied SOB, HA, nausea, vomiting, abdominal pain, diarrhea, sorethroat, coughs, or any skin rash. Evalaution in the ER showed mild leukopenia with WBC of 3.8K ( 2.7K 11 days ago), Platelet count of179K (136K 11 days ago), negative UA, and clear CXR. He has an allergy to PCN and Vancomycin as far as antibiotics go, and he was given IV Azactam and IV Levoquin. He was cultured. He was hypotensive, but tolerated quite well, and his lactic acid was  normal.   Hospital Course:  Fever/ Stenotrophomonas bacteremia -UA and CXR neg for infection -Initially treated with doxycyclin and Azactam (patient has PCN and vanc allergy) -Blood cultures from 07/31/2014: GNR --> stenotrophomonas maltophilia, sensitive to levaquin, bactrim -Spoke with Dr. [name], ID, who suggested 5 days IV antibiotics, followed by 2 weeks antibiotics- levaquin -was started on IV levaquin and discontinued aztreonam -Repeat blood cultures from 08/03/2014 show no growth to date  Diffuse large B-cell lymphoma -Previous hospitalist discussed with Dr.[name], patient to follow with her as an outpatient, no workup needed as an inpatient.  Tobacco abuse -Counseled to quit -continue nicotine patch  Leukopenia -improving, was 2.7 as an outpatient, currently 4.1   Severe malnutrition -Nutrition consulted, continue feeding supplementation  Procedures  None  Consults  ID, Dr [name], via phone Oncology, Dr. Alvy Bimler, by previous hospitalist  Discharge Exam: Filed Vitals:   08/05/14 0602  BP: 117/70  Pulse: 77  Temp: 98.3 F (36.8 C)  Resp: 20   Exam  General: Well developed, thin, No distress  HEENT: NCAT, mucous membranes moist.   Cardiovascular: S1 S2 auscultated, RRR, no murmurs  Respiratory: Clear to auscultation  Abdomen: Soft, nontender, nondistended, + bowel sounds  Extremities: warm dry without cyanosis clubbing or edema  Neuro: AAOx3, nonfocal  Psych: Normal affect and demeanor   Discharge Instructions      Discharge Instructions    Discharge instructions    Complete by:  As directed   Patient will be discharged to home.  Patient will need to follow up with primary care provider within one  week of discharge.  Patient should continue medications as prescribed.  Patient should follow a regular diet.  You were cared for by a hospitalist during your hospital stay. If you have any questions about your discharge medications or the care  you received while you were in the hospital after you are discharged, you can call the unit and asked to speak with the hospitalist on call if the hospitalist that took care of you is not available. Once you are discharged, your primary care physician will handle any further medical issues. Please note that NO REFILLS for any discharge medications will be authorized once you are discharged, as it is imperative that you return to your primary care physician (or establish a relationship with a primary care physician if you do not have one) for your aftercare needs so that they can reassess your need for medications and monitor your lab values.            Medication List    TAKE these medications        feeding supplement (ENSURE ENLIVE) Liqd  Take 237 mLs by mouth 3 (three) times daily between meals.     gabapentin 300 MG capsule  Commonly known as:  NEURONTIN  Take 600 mg by mouth 3 (three) times daily.     levETIRAcetam 250 MG tablet  Commonly known as:  KEPPRA  Take 250 mg by mouth 2 (two) times daily.     levofloxacin 750 MG tablet  Commonly known as:  LEVAQUIN  Take 1 tablet (750 mg total) by mouth daily.     meloxicam 15 MG tablet  Commonly known as:  MOBIC  Take 15 mg by mouth daily.     nicotine 14 mg/24hr patch  Commonly known as:  NICODERM CQ - dosed in mg/24 hours  Place 1 patch (14 mg total) onto the skin daily.     Oxycodone HCl 10 MG Tabs  Take 1 tablet (10 mg total) by mouth 3 (three) times daily as needed for severe pain.     saccharomyces boulardii 250 MG capsule  Commonly known as:  FLORASTOR  Take 1 capsule (250 mg total) by mouth 2 (two) times daily.     Vitamin D-3 5000 UNITS Tabs  Take 1 tablet by mouth daily.       Allergies  Allergen Reactions  . Vancomycin Itching and Rash    Extreme Itching, red skin from head to torso.    Marland Kitchen Cymbalta [Duloxetine Hcl] Other (See Comments)    Suicidal and "crazy"  . Penicillins Hives  . Amoxicillin Rash  .  Hydrocodone Rash   Follow-up Information    Follow up with [name], DAYNE DANIEL, MD. Schedule an appointment as soon as possible for a visit in 1 week.   Specialty:  Interventional Radiology   Why:  Hospital follow up   Contact information:   [address] [place] [address] [phone]        The results of significant diagnostics from this hospitalization (including imaging, microbiology, ancillary and laboratory) are listed below for reference.    Significant Diagnostic Studies: Dg Chest 2 View  07/31/2014   CLINICAL DATA:  Cough, congestion, fever.  History of pneumonia.  EXAM: CHEST  2 VIEW  COMPARISON:  01/10/2014  FINDINGS: Lungs are hyperinflated. There is prominence of interstitial markings, likely chronic. There are no focal consolidations or pleural effusions. No pulmonary edema.  Sequelae of L1 compression fracture noted. No further compression identified.  IMPRESSION: 1. Hyperinflation. 2.  No focal acute pulmonary abnormality.   Electronically Signed   By: Nolon Nations M.D.   On: 07/31/2014 17:05   Ct Abdomen Pelvis W Contrast  08/02/2014   CLINICAL DATA:  Right upper quadrant pain, gram negative rod bacteremia, history of lymphoma  EXAM: CT ABDOMEN AND PELVIS WITH CONTRAST  TECHNIQUE: Multidetector CT imaging of the abdomen and pelvis was performed using the standard protocol following bolus administration of intravenous contrast.  CONTRAST:  113mL OMNIPAQUE IOHEXOL 300 MG/ML  SOLN  COMPARISON:  01/10/2014  FINDINGS: Lower chest: Small bilateral pleural effusions, right greater than left. Associated minimal dependent atelectasis.  Hepatobiliary: Liver is within normal limits. No suspicious/enhancing hepatic lesions.  Gallbladder is decompressed. Mild gallbladder wall thickening/ edema.  Pancreas: Within normal limits.  Spleen: Spleen is mildly enlarged, measuring 14.2 cm in maximal dimension.  Adrenals/Urinary Tract: Adrenal glands are within normal  limits.  Kidneys are within normal limits. Bilateral extrarenal pelvis. No hydronephrosis.  Bladder is within normal limits.  Stomach/Bowel: Stomach is within normal limits.  No evidence of bowel obstruction.  Normal appendix.  Vascular/Lymphatic: No evidence of abdominal aortic aneurysm.  No suspicious abdominopelvic lymphadenopathy.  Reproductive: Prostate is unremarkable.  Other: No suspicious abdominopelvic lymphadenopathy.  Musculoskeletal: Mild superior endplate compression fracture deformity at L1, chronic.  Visualized thoracolumbar spine is otherwise within normal limits.  IMPRESSION: No evidence of bowel obstruction.  Normal appendix.  No suspicious lymphadenopathy in the abdomen/pelvis.  Mild splenomegaly in this patient with known lymphoma.  Small bilateral pleural effusions. Associated minimal dependent atelectasis at the lung bases.   Electronically Signed   By: Julian Hy M.D.   On: 08/02/2014 15:09    Microbiology: Recent Results (from the past 240 hour(s))  Urine culture     Status: None   Collection Time: 07/31/14  4:12 PM  Result Value Ref Range Status   Specimen Description URINE, CLEAN CATCH  Final   Special Requests NONE  Final   Culture   Final    NO GROWTH 1 DAY Performed at [facility]    Report Status 08/02/2014 FINAL  Final  Culture, blood (routine x 2)     Status: None (Preliminary result)   Collection Time: 07/31/14  4:15 PM  Result Value Ref Range Status   Specimen Description BLOOD LEFT ARM  Final   Special Requests BOTTLES DRAWN AEROBIC AND ANAEROBIC 10CC BOTTLES  Final   Culture  Setup Time   Final    GRAM NEGATIVE RODS CRITICAL RESULT CALLED TO, READ BACK BY AND VERIFIED WITH: SCOTTON J AT 4332 BY HUFFINES S ON 08/02/14. PATIENT TRANSFERRED TO [place] Performed at [place] Performed at [facility]    Report Status PENDING  Incomplete   Organism ID, Bacteria  STENOTROPHOMONAS MALTOPHILIA  Final      Susceptibility   Stenotrophomonas maltophilia - MIC*    LEVOFLOXACIN 0.5 SENSITIVE Sensitive     TRIMETH/SULFA <=20 SENSITIVE Sensitive     * STENOTROPHOMONAS MALTOPHILIA  Culture, blood (routine x 2)     Status: None   Collection Time: 07/31/14  4:17 PM  Result Value Ref Range Status   Specimen Description BLOOD RIGHT ARM  Final   Special Requests BOTTLES DRAWN AEROBIC AND ANAEROBIC 10CC BOTTLES  Final   Culture NO GROWTH 5 DAYS  Final   Report Status 08/05/2014 FINAL  Final  Culture, blood (routine x  2)     Status: None (Preliminary result)   Collection Time: 08/03/14 11:20 AM  Result Value Ref Range Status   Specimen Description BLOOD RIGHT ARM  Final   Special Requests BOTTLES DRAWN AEROBIC AND ANAEROBIC 10CC  Final   Culture   Final    NO GROWTH 1 DAY Performed at [facility]    Report Status PENDING  Incomplete  Culture, blood (routine x 2)     Status: None (Preliminary result)   Collection Time: 08/03/14 11:20 AM  Result Value Ref Range Status   Specimen Description BLOOD LEFT ARM  Final   Special Requests BOTTLES DRAWN AEROBIC AND ANAEROBIC 5CC  Final   Culture   Final    NO GROWTH 1 DAY Performed at [facility]    Report Status PENDING  Incomplete     Labs: Basic Metabolic Panel:  Recent Labs Lab 07/31/14 1615 07/31/14 2150 08/01/14 0115 08/02/14 0426 08/03/14 0445  NA 138  --  140 139 142  K 3.6  --  3.7 3.5 3.7  CL 104  --  112* 107 108  CO2 28  --  23 26 26   GLUCOSE 102*  --  92 101* 96  BUN 9  --  <5* 6 7  CREATININE 0.78 0.69 0.64 0.69 0.62  CALCIUM 7.9*  --  6.8* 8.2* 8.4*   Liver Function Tests:  Recent Labs Lab 07/31/14 1615  AST 19  ALT 14*  ALKPHOS 43  BILITOT 0.7  PROT 5.9*  ALBUMIN 3.3*    Recent Labs Lab 07/31/14 1615  LIPASE 20*   No results for input(s): AMMONIA in the last 168 hours. CBC:  Recent Labs Lab 07/31/14 1615 07/31/14 2150 08/01/14 0115  08/01/14 1341 08/03/14 0445  WBC 3.8* 3.7* 3.2* 3.0* 4.1  NEUTROABS 3.0  --   --  1.8 2.5  HGB 11.6* 11.0* 11.7* 12.1* 12.2*  HCT 35.3* 33.9* 35.7* 36.7* 36.5*  MCV 91.2 92.4 92.2 90.4 89.0  PLT 179 194 189 250 281   Cardiac Enzymes: No results for input(s): CKTOTAL, CKMB, CKMBINDEX, TROPONINI in the last 168 hours. BNP: BNP (last 3 results) No results for input(s): BNP in the last 8760 hours.  ProBNP (last 3 results) No results for input(s): PROBNP in the last 8760 hours.  CBG: No results for input(s): GLUCAP in the last 168 hours.     SignedCristal Ford  [facility] 08/05/2014, 10:19 AM

## 2014-08-05 NOTE — Discharge Instructions (Signed)
Bacteremia °Bacteremia occurs when bacteria get in your blood. Normal blood does not usually have bacteria. Bacteremia is one way infections can spread from one part of the body to another. °CAUSES  °· Causes may include anything that allows bacteria to get into the body. Examples are: °¨ Catheters. °¨ Intravenous (IV) access tubes. °¨ Cuts or scrapes of the skin. °· Temporary bacteremia may occur during dental procedures, while brushing your teeth, or during a bowel movement. This rarely causes any symptoms or medical problems. °· Bacteria may also get in the bloodstream as a complication of a bacterial infection elsewhere. This includes infected wounds and bacterial infections of the: °¨ Lungs (pneumonia). °¨ Kidneys (pyelonephritis). °¨ Intestines (enteritis, colitis). °¨ Organs in the abdomen (appendicitis, cholecystitis, diverticulitis). °SYMPTOMS  °The body is usually able to clear small numbers of bacteria out of the blood quickly. Brief bacteremia usually does not cause problems.  °· Problems can occur if the bacteria start to grow in number or spread to other parts of the body. If the bacteria start growing, you may develop: °¨ Chills. °¨ Fever. °¨ Nausea. °¨ Vomiting. °¨ Sweating. °¨ Lightheadedness and low blood pressure. °¨ Pain. °· If bacteria start to grow in the linings around the brain, it is called meningitis. This can cause severe headaches, many other problems, and even death. °· If bacteria start to grow in a joint, it causes arthritis with painful joints. If bacteria start to grow in a bone, it is called osteomyelitis. °· Bacteria from the blood can also cause sores (abscesses) in many organs, such as the muscle, liver, spleen, lungs, brain, and kidneys. °DIAGNOSIS  °· This condition is diagnosed by cultures of the blood. °· Cultures may also be taken from other parts of the body that are thought to be causing the bacteremia. A small piece of tissue, fluid, or other product of the body is  sampled. The sample is then put on a growth plate to see if any bacteria grows. °· Other lab tests may be done and the results may be abnormal. °TREATMENT  °Treatment requires a stay in the hospital. You will be given antibiotic medicine through an IV access tube. °PREVENTION  °People with an increased risk of developing bacteremia or complications may be given antibiotics before certain procedures. Examples are: °· A person with a heart murmur or artificial heart valve, before having his or her teeth cleaned. °· Before having a surgical or other invasive procedure. °· Before having a bowel procedure. °Document Released: 11/05/2005 Document Revised: 04/16/2011 Document Reviewed: 08/17/2010 °ExitCare® Patient Information ©2015 [employer]. This information is not intended to replace advice given to you by your health care provider. Make sure you discuss any questions you have with your health care provider. ° °

## 2014-08-06 LAB — CULTURE, BLOOD (ROUTINE X 2)

## 2014-08-08 LAB — CULTURE, BLOOD (ROUTINE X 2)
CULTURE: NO GROWTH
Culture: NO GROWTH

## 2014-08-18 DIAGNOSIS — Z79891 Long term (current) use of opiate analgesic: Secondary | ICD-10-CM | POA: Diagnosis not present

## 2014-08-18 DIAGNOSIS — G540 Brachial plexus disorders: Secondary | ICD-10-CM | POA: Diagnosis not present

## 2014-08-18 DIAGNOSIS — Z79899 Other long term (current) drug therapy: Secondary | ICD-10-CM | POA: Diagnosis not present

## 2014-08-18 DIAGNOSIS — Z5181 Encounter for therapeutic drug level monitoring: Secondary | ICD-10-CM | POA: Diagnosis not present

## 2014-08-18 DIAGNOSIS — G620 Drug-induced polyneuropathy: Secondary | ICD-10-CM | POA: Diagnosis not present

## 2014-08-18 DIAGNOSIS — M5416 Radiculopathy, lumbar region: Secondary | ICD-10-CM | POA: Diagnosis not present

## 2014-08-18 DIAGNOSIS — M542 Cervicalgia: Secondary | ICD-10-CM | POA: Diagnosis not present

## 2014-10-13 DIAGNOSIS — S32000D Wedge compression fracture of unspecified lumbar vertebra, subsequent encounter for fracture with routine healing: Secondary | ICD-10-CM | POA: Diagnosis not present

## 2014-10-13 DIAGNOSIS — G620 Drug-induced polyneuropathy: Secondary | ICD-10-CM | POA: Diagnosis not present

## 2014-10-13 DIAGNOSIS — M542 Cervicalgia: Secondary | ICD-10-CM | POA: Diagnosis not present

## 2014-10-13 DIAGNOSIS — G540 Brachial plexus disorders: Secondary | ICD-10-CM | POA: Diagnosis not present

## 2014-10-13 DIAGNOSIS — T451X5A Adverse effect of antineoplastic and immunosuppressive drugs, initial encounter: Secondary | ICD-10-CM | POA: Diagnosis not present

## 2014-10-13 DIAGNOSIS — G5622 Lesion of ulnar nerve, left upper limb: Secondary | ICD-10-CM | POA: Diagnosis not present

## 2014-10-13 DIAGNOSIS — M5416 Radiculopathy, lumbar region: Secondary | ICD-10-CM | POA: Diagnosis not present

## 2014-11-22 DIAGNOSIS — M542 Cervicalgia: Secondary | ICD-10-CM | POA: Diagnosis not present

## 2014-11-24 DIAGNOSIS — M542 Cervicalgia: Secondary | ICD-10-CM | POA: Diagnosis not present

## 2014-12-19 ENCOUNTER — Emergency Department (HOSPITAL_COMMUNITY)
Admission: EM | Admit: 2014-12-19 | Discharge: 2014-12-19 | Disposition: A | Discharge status: Home / Self Care | Payer: Medicare Other | Attending: Emergency Medicine | Admitting: Emergency Medicine

## 2014-12-19 ENCOUNTER — Emergency Department (HOSPITAL_COMMUNITY): Payer: Medicare Other

## 2014-12-19 ENCOUNTER — Encounter (HOSPITAL_COMMUNITY): Payer: Self-pay | Admitting: Emergency Medicine

## 2014-12-19 DIAGNOSIS — G8929 Other chronic pain: Secondary | ICD-10-CM | POA: Diagnosis not present

## 2014-12-19 DIAGNOSIS — Z792 Long term (current) use of antibiotics: Secondary | ICD-10-CM | POA: Diagnosis not present

## 2014-12-19 DIAGNOSIS — R52 Pain, unspecified: Secondary | ICD-10-CM

## 2014-12-19 DIAGNOSIS — S0012XA Contusion of left eyelid and periocular area, initial encounter: Secondary | ICD-10-CM | POA: Diagnosis not present

## 2014-12-19 DIAGNOSIS — Z8701 Personal history of pneumonia (recurrent): Secondary | ICD-10-CM | POA: Insufficient documentation

## 2014-12-19 DIAGNOSIS — Y9389 Activity, other specified: Secondary | ICD-10-CM | POA: Insufficient documentation

## 2014-12-19 DIAGNOSIS — S00411A Abrasion of right ear, initial encounter: Secondary | ICD-10-CM | POA: Diagnosis not present

## 2014-12-19 DIAGNOSIS — Z880 Allergy status to penicillin: Secondary | ICD-10-CM | POA: Insufficient documentation

## 2014-12-19 DIAGNOSIS — S99921A Unspecified injury of right foot, initial encounter: Secondary | ICD-10-CM | POA: Diagnosis not present

## 2014-12-19 DIAGNOSIS — Z791 Long term (current) use of non-steroidal anti-inflammatories (NSAID): Secondary | ICD-10-CM | POA: Diagnosis not present

## 2014-12-19 DIAGNOSIS — T07XXXA Unspecified multiple injuries, initial encounter: Secondary | ICD-10-CM

## 2014-12-19 DIAGNOSIS — T148 Other injury of unspecified body region: Secondary | ICD-10-CM | POA: Insufficient documentation

## 2014-12-19 DIAGNOSIS — S0081XA Abrasion of other part of head, initial encounter: Secondary | ICD-10-CM | POA: Diagnosis not present

## 2014-12-19 DIAGNOSIS — Y998 Other external cause status: Secondary | ICD-10-CM | POA: Insufficient documentation

## 2014-12-19 DIAGNOSIS — R0781 Pleurodynia: Secondary | ICD-10-CM | POA: Diagnosis not present

## 2014-12-19 DIAGNOSIS — Y9289 Other specified places as the place of occurrence of the external cause: Secondary | ICD-10-CM | POA: Insufficient documentation

## 2014-12-19 DIAGNOSIS — R220 Localized swelling, mass and lump, head: Secondary | ICD-10-CM | POA: Diagnosis not present

## 2014-12-19 DIAGNOSIS — H1132 Conjunctival hemorrhage, left eye: Secondary | ICD-10-CM | POA: Diagnosis not present

## 2014-12-19 DIAGNOSIS — Z8719 Personal history of other diseases of the digestive system: Secondary | ICD-10-CM | POA: Insufficient documentation

## 2014-12-19 DIAGNOSIS — M79671 Pain in right foot: Secondary | ICD-10-CM | POA: Diagnosis not present

## 2014-12-19 DIAGNOSIS — S299XXA Unspecified injury of thorax, initial encounter: Secondary | ICD-10-CM | POA: Diagnosis present

## 2014-12-19 DIAGNOSIS — G629 Polyneuropathy, unspecified: Secondary | ICD-10-CM | POA: Diagnosis not present

## 2014-12-19 DIAGNOSIS — F17210 Nicotine dependence, cigarettes, uncomplicated: Secondary | ICD-10-CM | POA: Insufficient documentation

## 2014-12-19 DIAGNOSIS — Z79899 Other long term (current) drug therapy: Secondary | ICD-10-CM | POA: Diagnosis not present

## 2014-12-19 DIAGNOSIS — S2232XA Fracture of one rib, left side, initial encounter for closed fracture: Secondary | ICD-10-CM | POA: Diagnosis not present

## 2014-12-19 HISTORY — DX: Injury of unspecified nerves of neck, initial encounter: S14.9XXA

## 2014-12-19 HISTORY — DX: Mononeuropathy, unspecified: G58.9

## 2014-12-19 MED ORDER — KETOROLAC TROMETHAMINE 60 MG/2ML IM SOLN
60.0000 mg | Freq: Once | INTRAMUSCULAR | Status: AC
  Administered 2014-12-19: 60 mg via INTRAMUSCULAR
  Filled 2014-12-19: qty 2

## 2014-12-19 NOTE — Discharge Instructions (Signed)
Musculoskeletal Pain Musculoskeletal pain is muscle and boney aches and pains. These pains can occur in any part of the body. Your caregiver may treat you without knowing the cause of the pain. They may treat you if blood or urine tests, X-rays, and other tests were normal.  CAUSES There is often not a definite cause or reason for these pains. These pains may be caused by a type of germ (virus). The discomfort may also come from overuse. Overuse includes working out too hard when your body is not fit. Boney aches also come from weather changes. Bone is sensitive to atmospheric pressure changes. HOME CARE INSTRUCTIONS   Ask when your test results will be ready. Make sure you get your test results.  Only take over-the-counter or prescription medicines for pain, discomfort, or fever as directed by your caregiver. If you were given medications for your condition, do not drive, operate machinery or power tools, or sign legal documents for 24 hours. Do not drink alcohol. Do not take sleeping pills or other medications that may interfere with treatment.  Continue all activities unless the activities cause more pain. When the pain lessens, slowly resume normal activities. Gradually increase the intensity and duration of the activities or exercise.  During periods of severe pain, bed rest may be helpful. Lay or sit in any position that is comfortable.  Putting ice on the injured area.  Put ice in a bag.  Place a towel between your skin and the bag.  Leave the ice on for 15 to 20 minutes, 3 to 4 times a day.  Follow up with your caregiver for continued problems and no reason can be found for the pain. If the pain becomes worse or does not go away, it may be necessary to repeat tests or do additional testing. Your caregiver may need to look further for a possible cause. SEEK IMMEDIATE MEDICAL CARE IF:  You have pain that is getting worse and is not relieved by medications.  You develop chest pain  that is associated with shortness or breath, sweating, feeling sick to your stomach (nauseous), or throw up (vomit).  Your pain becomes localized to the abdomen.  You develop any new symptoms that seem different or that concern you. MAKE SURE YOU:   Understand these instructions.  Will watch your condition.  Will get help right away if you are not doing well or get worse.   This information is not intended to replace advice given to you by your health care provider. Make sure you discuss any questions you have with your health care provider.   Document Released: 01/22/2005 Document Revised: 04/16/2011 Document Reviewed: 09/26/2012 Elsevier Interactive Patient Education 2016 [name].  Rib Fracture A rib fracture is a break or crack in one of the bones of the ribs. The ribs are a group of long, curved bones that wrap around your chest and attach to your spine. They protect your lungs and other organs in the chest cavity. A broken or cracked rib is often painful, but most do not cause other problems. Most rib fractures heal on their own over time. However, rib fractures can be more serious if multiple ribs are broken or if broken ribs move out of place and push against other structures. CAUSES   A direct blow to the chest. For example, this could happen during contact sports, a car accident, or a fall against a hard object.  Repetitive movements with high force, such as pitching a baseball or having  severe coughing spells. SYMPTOMS   Pain when you breathe in or cough.  Pain when someone presses on the injured area. DIAGNOSIS  Your caregiver will perform a physical exam. Various imaging tests may be ordered to confirm the diagnosis and to look for related injuries. These tests may include a chest X-ray, computed tomography (CT), magnetic resonance imaging (MRI), or a bone scan. TREATMENT  Rib fractures usually heal on their own in 1-3 months. The longer healing period is often  associated with a continued cough or other aggravating activities. During the healing period, pain control is very important. Medication is usually given to control pain. Hospitalization or surgery may be needed for more severe injuries, such as those in which multiple ribs are broken or the ribs have moved out of place.  HOME CARE INSTRUCTIONS   Avoid strenuous activity and any activities or movements that cause pain. Be careful during activities and avoid bumping the injured rib.  Gradually increase activity as directed by your caregiver.  Only take over-the-counter or prescription medications as directed by your caregiver. Do not take other medications without asking your caregiver first.  Apply ice to the injured area for the first 1-2 days after you have been treated or as directed by your caregiver. Applying ice helps to reduce inflammation and pain.  Put ice in a plastic bag.  Place a towel between your skin and the bag.   Leave the ice on for 15-20 minutes at a time, every 2 hours while you are awake.  Perform deep breathing as directed by your caregiver. This will help prevent pneumonia, which is a common complication of a broken rib. Your caregiver may instruct you to:  Take deep breaths several times a day.  Try to cough several times a day, holding a pillow against the injured area.  Use a device called an incentive spirometer to practice deep breathing several times a day.  Drink enough fluids to keep your urine clear or pale yellow. This will help you avoid constipation.   Do not wear a rib belt or binder. These restrict breathing, which can lead to pneumonia.  SEEK IMMEDIATE MEDICAL CARE IF:   You have a fever.   You have difficulty breathing or shortness of breath.   You develop a continual cough, or you cough up thick or bloody sputum.  You feel sick to your stomach (nausea), throw up (vomit), or have abdominal pain.   You have worsening pain not  controlled with medications.  MAKE SURE YOU:  Understand these instructions.  Will watch your condition.  Will get help right away if you are not doing well or get worse.   This information is not intended to replace advice given to you by your health care provider. Make sure you discuss any questions you have with your health care provider.   Document Released: 01/22/2005 Document Revised: 09/24/2012 Document Reviewed: 03/26/2012 Elsevier Interactive Patient Education [employer].
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
## 2014-12-20 DIAGNOSIS — M542 Cervicalgia: Secondary | ICD-10-CM | POA: Diagnosis not present

## 2014-12-28 ENCOUNTER — Encounter (HOSPITAL_COMMUNITY): Payer: Self-pay | Admitting: Emergency Medicine

## 2014-12-28 ENCOUNTER — Inpatient Hospital Stay (HOSPITAL_COMMUNITY): Payer: Medicare Other

## 2014-12-28 ENCOUNTER — Emergency Department (HOSPITAL_COMMUNITY): Payer: Medicare Other

## 2014-12-28 ENCOUNTER — Inpatient Hospital Stay (HOSPITAL_COMMUNITY)
Admission: EM | Admit: 2014-12-28 | Discharge: 2014-12-31 | DRG: 871 | Disposition: A | Discharge status: Home / Self Care | Payer: Medicare Other | Attending: Internal Medicine | Admitting: Internal Medicine

## 2014-12-28 DIAGNOSIS — Z9221 Personal history of antineoplastic chemotherapy: Secondary | ICD-10-CM | POA: Diagnosis not present

## 2014-12-28 DIAGNOSIS — Z888 Allergy status to other drugs, medicaments and biological substances status: Secondary | ICD-10-CM | POA: Diagnosis not present

## 2014-12-28 DIAGNOSIS — J189 Pneumonia, unspecified organism: Secondary | ICD-10-CM | POA: Diagnosis present

## 2014-12-28 DIAGNOSIS — J9601 Acute respiratory failure with hypoxia: Secondary | ICD-10-CM | POA: Diagnosis present

## 2014-12-28 DIAGNOSIS — M545 Low back pain: Secondary | ICD-10-CM | POA: Diagnosis present

## 2014-12-28 DIAGNOSIS — Z923 Personal history of irradiation: Secondary | ICD-10-CM

## 2014-12-28 DIAGNOSIS — R0602 Shortness of breath: Secondary | ICD-10-CM | POA: Diagnosis not present

## 2014-12-28 DIAGNOSIS — R636 Underweight: Secondary | ICD-10-CM | POA: Diagnosis present

## 2014-12-28 DIAGNOSIS — Z452 Encounter for adjustment and management of vascular access device: Secondary | ICD-10-CM | POA: Diagnosis not present

## 2014-12-28 DIAGNOSIS — Z79891 Long term (current) use of opiate analgesic: Secondary | ICD-10-CM | POA: Diagnosis not present

## 2014-12-28 DIAGNOSIS — E876 Hypokalemia: Secondary | ICD-10-CM | POA: Diagnosis not present

## 2014-12-28 DIAGNOSIS — Z880 Allergy status to penicillin: Secondary | ICD-10-CM

## 2014-12-28 DIAGNOSIS — A419 Sepsis, unspecified organism: Secondary | ICD-10-CM | POA: Diagnosis present

## 2014-12-28 DIAGNOSIS — R6521 Severe sepsis with septic shock: Secondary | ICD-10-CM | POA: Diagnosis present

## 2014-12-28 DIAGNOSIS — K219 Gastro-esophageal reflux disease without esophagitis: Secondary | ICD-10-CM | POA: Diagnosis present

## 2014-12-28 DIAGNOSIS — Z8572 Personal history of non-Hodgkin lymphomas: Secondary | ICD-10-CM | POA: Diagnosis present

## 2014-12-28 DIAGNOSIS — R451 Restlessness and agitation: Secondary | ICD-10-CM | POA: Diagnosis not present

## 2014-12-28 DIAGNOSIS — Z881 Allergy status to other antibiotic agents status: Secondary | ICD-10-CM

## 2014-12-28 DIAGNOSIS — Z720 Tobacco use: Secondary | ICD-10-CM

## 2014-12-28 DIAGNOSIS — Z885 Allergy status to narcotic agent status: Secondary | ICD-10-CM

## 2014-12-28 DIAGNOSIS — R4182 Altered mental status, unspecified: Secondary | ICD-10-CM | POA: Diagnosis not present

## 2014-12-28 DIAGNOSIS — F17210 Nicotine dependence, cigarettes, uncomplicated: Secondary | ICD-10-CM | POA: Diagnosis present

## 2014-12-28 DIAGNOSIS — R410 Disorientation, unspecified: Secondary | ICD-10-CM | POA: Diagnosis not present

## 2014-12-28 DIAGNOSIS — M542 Cervicalgia: Secondary | ICD-10-CM | POA: Diagnosis present

## 2014-12-28 DIAGNOSIS — C8330 Diffuse large B-cell lymphoma, unspecified site: Secondary | ICD-10-CM | POA: Diagnosis present

## 2014-12-28 DIAGNOSIS — E440 Moderate protein-calorie malnutrition: Secondary | ICD-10-CM | POA: Diagnosis present

## 2014-12-28 DIAGNOSIS — Z79899 Other long term (current) drug therapy: Secondary | ICD-10-CM | POA: Diagnosis not present

## 2014-12-28 DIAGNOSIS — G8929 Other chronic pain: Secondary | ICD-10-CM | POA: Diagnosis present

## 2014-12-28 DIAGNOSIS — D649 Anemia, unspecified: Secondary | ICD-10-CM | POA: Diagnosis present

## 2014-12-28 DIAGNOSIS — R64 Cachexia: Secondary | ICD-10-CM | POA: Diagnosis not present

## 2014-12-28 DIAGNOSIS — R05 Cough: Secondary | ICD-10-CM | POA: Diagnosis not present

## 2014-12-28 DIAGNOSIS — Z681 Body mass index (BMI) 19 or less, adult: Secondary | ICD-10-CM | POA: Diagnosis not present

## 2014-12-28 DIAGNOSIS — G629 Polyneuropathy, unspecified: Secondary | ICD-10-CM

## 2014-12-28 DIAGNOSIS — R918 Other nonspecific abnormal finding of lung field: Secondary | ICD-10-CM | POA: Diagnosis not present

## 2014-12-28 LAB — CBC WITH DIFFERENTIAL/PLATELET
BASOS ABS: 0 10*3/uL (ref 0.0–0.1)
Basophils Relative: 0 %
EOS PCT: 0 %
Eosinophils Absolute: 0 10*3/uL (ref 0.0–0.7)
HEMATOCRIT: 29.8 % — AB (ref 39.0–52.0)
Hemoglobin: 9.7 g/dL — ABNORMAL LOW (ref 13.0–17.0)
LYMPHS ABS: 0.5 10*3/uL — AB (ref 0.7–4.0)
LYMPHS PCT: 6 %
MCH: 26.4 pg (ref 26.0–34.0)
MCHC: 32.6 g/dL (ref 30.0–36.0)
MCV: 81.2 fL (ref 78.0–100.0)
MONO ABS: 0.6 10*3/uL (ref 0.1–1.0)
MONOS PCT: 7 %
NEUTROS ABS: 7.3 10*3/uL (ref 1.7–7.7)
Neutrophils Relative %: 87 %
PLATELETS: 165 10*3/uL (ref 150–400)
RBC: 3.67 MIL/uL — ABNORMAL LOW (ref 4.22–5.81)
RDW: 14.2 % (ref 11.5–15.5)
WBC: 8.3 10*3/uL (ref 4.0–10.5)

## 2014-12-28 LAB — LACTIC ACID, PLASMA: LACTIC ACID, VENOUS: 1 mmol/L (ref 0.5–2.0)

## 2014-12-28 LAB — BASIC METABOLIC PANEL
ANION GAP: 9 (ref 5–15)
BUN: 15 mg/dL (ref 6–20)
CALCIUM: 7.8 mg/dL — AB (ref 8.9–10.3)
CO2: 25 mmol/L (ref 22–32)
Chloride: 101 mmol/L (ref 101–111)
Creatinine, Ser: 0.84 mg/dL (ref 0.61–1.24)
GFR calc Af Amer: >60 mL/min (ref >60)
GLUCOSE: 136 mg/dL — AB (ref 65–99)
Potassium: 3.1 mmol/L — ABNORMAL LOW (ref 3.5–5.1)
Sodium: 135 mmol/L (ref 135–145)

## 2014-12-28 LAB — RETICULOCYTES
RBC.: 3.85 MIL/uL — ABNORMAL LOW (ref 4.22–5.81)
Retic Count, Absolute: 19.3 10*3/uL (ref 19.0–186.0)
Retic Ct Pct: 0.5 % (ref 0.4–3.1)

## 2014-12-28 LAB — IRON AND TIBC
IRON: 12 ug/dL — AB (ref 45–182)
Saturation Ratios: 7 % — ABNORMAL LOW (ref 17.9–39.5)
TIBC: 168 ug/dL — AB (ref 250–450)
UIBC: 156 ug/dL

## 2014-12-28 LAB — FOLATE: Folate: 11.5 ng/mL (ref >5.9)

## 2014-12-28 LAB — MRSA PCR SCREENING: MRSA by PCR: POSITIVE — AB

## 2014-12-28 LAB — FERRITIN: FERRITIN: 794 ng/mL — AB (ref 24–336)

## 2014-12-28 LAB — PROCALCITONIN: Procalcitonin: 0.92 ng/mL

## 2014-12-28 LAB — VITAMIN B12: Vitamin B-12: 538 pg/mL (ref 180–914)

## 2014-12-28 MED ORDER — SODIUM CHLORIDE 0.9 % IV BOLUS (SEPSIS)
1000.0000 mL | Freq: Once | INTRAVENOUS | Status: AC
  Administered 2014-12-28: 1000 mL via INTRAVENOUS

## 2014-12-28 MED ORDER — OXYCODONE HCL 5 MG PO TABS
10.0000 mg | ORAL_TABLET | Freq: Once | ORAL | Status: AC
  Administered 2014-12-28: 10 mg via ORAL
  Filled 2014-12-28: qty 2

## 2014-12-28 MED ORDER — MUPIROCIN 2 % EX OINT
1.0000 "application " | TOPICAL_OINTMENT | Freq: Two times a day (BID) | CUTANEOUS | Status: DC
  Administered 2014-12-29 – 2014-12-31 (×6): 1 via NASAL
  Filled 2014-12-28: qty 22

## 2014-12-28 MED ORDER — LEVETIRACETAM 250 MG PO TABS
250.0000 mg | ORAL_TABLET | Freq: Two times a day (BID) | ORAL | Status: DC
  Administered 2014-12-29 – 2014-12-31 (×6): 250 mg via ORAL
  Filled 2014-12-28 (×9): qty 1

## 2014-12-28 MED ORDER — POTASSIUM CHLORIDE CRYS ER 20 MEQ PO TBCR
40.0000 meq | EXTENDED_RELEASE_TABLET | Freq: Once | ORAL | Status: AC
  Administered 2014-12-28: 40 meq via ORAL
  Filled 2014-12-28: qty 2

## 2014-12-28 MED ORDER — OXYCODONE HCL 5 MG PO TABS
10.0000 mg | ORAL_TABLET | Freq: Three times a day (TID) | ORAL | Status: DC | PRN
  Administered 2014-12-29 – 2014-12-30 (×5): 10 mg via ORAL
  Filled 2014-12-28 (×5): qty 2

## 2014-12-28 MED ORDER — IBUPROFEN 200 MG PO TABS
600.0000 mg | ORAL_TABLET | Freq: Once | ORAL | Status: AC
  Administered 2014-12-28: 600 mg via ORAL
  Filled 2014-12-28: qty 3

## 2014-12-28 MED ORDER — CHLORHEXIDINE GLUCONATE CLOTH 2 % EX PADS
6.0000 | MEDICATED_PAD | Freq: Every day | CUTANEOUS | Status: DC
  Administered 2014-12-29 – 2014-12-31 (×3): 6 via TOPICAL

## 2014-12-28 MED ORDER — SODIUM CHLORIDE 0.9 % IV SOLN
INTRAVENOUS | Status: DC
  Administered 2014-12-28 – 2014-12-31 (×2): via INTRAVENOUS

## 2014-12-28 MED ORDER — ACETAMINOPHEN 500 MG PO TABS
1000.0000 mg | ORAL_TABLET | Freq: Once | ORAL | Status: AC
  Administered 2014-12-28: 1000 mg via ORAL
  Filled 2014-12-28: qty 2

## 2014-12-28 MED ORDER — LEVOFLOXACIN IN D5W 750 MG/150ML IV SOLN
750.0000 mg | INTRAVENOUS | Status: DC
  Administered 2014-12-29 – 2014-12-31 (×3): 750 mg via INTRAVENOUS
  Filled 2014-12-28 (×3): qty 150

## 2014-12-28 MED ORDER — SODIUM CHLORIDE 0.9 % IV BOLUS (SEPSIS)
2000.0000 mL | Freq: Once | INTRAVENOUS | Status: AC
  Administered 2014-12-28: 2000 mL via INTRAVENOUS

## 2014-12-28 MED ORDER — ENSURE ENLIVE PO LIQD
237.0000 mL | Freq: Three times a day (TID) | ORAL | Status: DC
  Administered 2014-12-28 – 2014-12-30 (×3): 237 mL via ORAL

## 2014-12-28 MED ORDER — SACCHAROMYCES BOULARDII 250 MG PO CAPS
250.0000 mg | ORAL_CAPSULE | Freq: Two times a day (BID) | ORAL | Status: DC
  Administered 2014-12-29 – 2014-12-31 (×6): 250 mg via ORAL
  Filled 2014-12-28 (×7): qty 1

## 2014-12-28 MED ORDER — MELOXICAM 15 MG PO TABS
15.0000 mg | ORAL_TABLET | Freq: Every day | ORAL | Status: DC
  Administered 2014-12-29 – 2014-12-31 (×2): 15 mg via ORAL
  Filled 2014-12-28 (×3): qty 1

## 2014-12-28 MED ORDER — NICOTINE 14 MG/24HR TD PT24
14.0000 mg | MEDICATED_PATCH | Freq: Every day | TRANSDERMAL | Status: DC
  Administered 2014-12-28 – 2014-12-31 (×4): 14 mg via TRANSDERMAL
  Filled 2014-12-28 (×4): qty 1

## 2014-12-28 MED ORDER — GABAPENTIN 300 MG PO CAPS
600.0000 mg | ORAL_CAPSULE | Freq: Three times a day (TID) | ORAL | Status: DC
  Administered 2014-12-28 – 2014-12-31 (×9): 600 mg via ORAL
  Filled 2014-12-28 (×11): qty 2

## 2014-12-28 MED ORDER — ENOXAPARIN SODIUM 40 MG/0.4ML ~~LOC~~ SOLN
40.0000 mg | SUBCUTANEOUS | Status: DC
  Administered 2014-12-29: 40 mg via SUBCUTANEOUS
  Filled 2014-12-28 (×4): qty 0.4

## 2014-12-28 MED ORDER — ALBUTEROL SULFATE (2.5 MG/3ML) 0.083% IN NEBU
5.0000 mg | INHALATION_SOLUTION | Freq: Once | RESPIRATORY_TRACT | Status: AC
  Administered 2014-12-28: 5 mg via RESPIRATORY_TRACT
  Filled 2014-12-28: qty 6

## 2014-12-28 MED ORDER — ALBUTEROL SULFATE HFA 108 (90 BASE) MCG/ACT IN AERS
2.0000 | INHALATION_SPRAY | Freq: Once | RESPIRATORY_TRACT | Status: AC
Start: 2014-12-28 — End: 2014-12-28
  Administered 2014-12-28: 2 via RESPIRATORY_TRACT
  Filled 2014-12-28: qty 6.7

## 2014-12-28 MED ORDER — LEVOFLOXACIN 500 MG PO TABS
500.0000 mg | ORAL_TABLET | Freq: Every day | ORAL | Status: DC
  Administered 2014-12-28: 500 mg via ORAL
  Filled 2014-12-28: qty 1

## 2014-12-28 NOTE — Progress Notes (Signed)
Patient BP now back in 60's. NP Schoor notified. Patient is symptomatic. Patient has delayed responses, poor attention. Patient is pale. An additional 1 liter fluid bolus is being given per NP. Patient has not made any urine today. Patient is hypothermic. Last rectal temp was 93.6. Bair hugger is applied to patient.

## 2014-12-28 NOTE — Progress Notes (Signed)
CRITICAL VALUE ALERT  Critical value received:  Positive MRSA   Date of notification:  12/28/14  Time of notification:  1930  Critical value read back:Yes.    Nurse who received alert:  Duard Larsen, RN  Contact isolation implemented per protocol

## 2014-12-28 NOTE — H&P (Signed)
[facility] History and Physical  Nicholas King [id number] DOB: 1970/05/27 DOA: 12/28/2014  Referring physician: Dr. Jola Schmidt, EDP PCP: Wallace Going, [name], MD  Specialists:   Chief Complaint: Cough  HPI: Nicholas King is a 44 y.o. male  With a history of lymphoma, chronic pain, presented to the emergency department with complaints of cough and shortness of breath. Patient states he has been coughing for approximately 1 week. His mother has also been sick. Patient states his sputum has been white to yellow in color. He has not tried taking any over-the-counter medications. He does feel that he has been somewhat short of breath. He also admits to having fever at home. Patient denies any chest pain or abdominal pain, comes with urination or change in bowel pattern. He states that his lymphoma is not being treated at this time. In the emergency department, patient was also noted to be hypoxic upon ambulation. He was given 1 dose of Levaquin. TR[facility] called for admission.  Review of Systems:  Constitutional: The planes of decreased appetite and fever. HEENT: Denies photophobia, eye pain, redness, hearing loss, ear pain, congestion, sore throat, rhinorrhea, sneezing, mouth sores, trouble swallowing, neck pain, neck stiffness and tinnitus.   Respiratory: Complains of shortness of breath and cough.   Cardiovascular: Denies chest pain, palpitations and leg swelling.  Gastrointestinal: Denies nausea, vomiting, abdominal pain, diarrhea, constipation, blood in stool and abdominal distention.  Genitourinary: Denies dysuria, urgency, frequency, hematuria, flank pain and difficulty urinating.  Musculoskeletal: Denies myalgias, back pain, joint swelling, arthralgias and gait problem.  Skin: Denies pallor, rash and wound.  Neurological: Denies dizziness, seizures, syncope, weakness, light-headedness, numbness and headaches.  Hematological: Denies adenopathy. Easy bruising, personal or  family bleeding history  Psychiatric/Behavioral: Denies suicidal ideation, mood changes, confusion, nervousness, sleep disturbance and agitation  Past Medical History  Diagnosis Date  . GERD (gastroesophageal reflux disease)   . Cancer ([place])   . Lymphoma ([place])   . Dysphagia, unspecified(787.20) 05/16/2012  . Pneumonia     April 2014  . 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 Neuropathic pain 11/06/2012  . Radiation 10/30/12-11/25/12    Bilateral Neck/Tonsil  . Neuropathy ([place]) 12/26/2012  . Bone pain 12/26/2012  . Lumbago 01/06/2013  . Radiation 10/30/12-11/25/12    Bilateral neck and tonsils  . Compression fracture 01/2014    L1  . 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 Chronic neck pain     Hx C7 fx after fall 01/2014  . Pain management   . Chronic back pain   . Pinched nerve in neck    Past Surgical History  Procedure Laterality Date  . No past surgeries    . Direct laryngoscopy Left 05/08/2012    Procedure: DIRECT LARYNGOSCOPY with biopsy left tonsil;  Surgeon: Melissa Montane, MD;  Location: [place];  Service: ENT;  Laterality: Left;  . Right eyebrown bone      Right frontal skull fracture   Social History:  reports that he has been smoking Cigarettes.  He has a 12.5 pack-year smoking history. He has never used smokeless tobacco. He reports that he does not drink alcohol or use illicit drugs.   Allergies  Allergen Reactions  . Vancomycin Itching and Rash    Extreme Itching, red skin from head to torso.    Marland Kitchen Cymbalta [Duloxetine Hcl] Other (See Comments)    Suicidal and "crazy"  . Penicillins Hives    Has patient had a PCN reaction causing immediate rash, facial/tongue/throat  swelling, SOB or lightheadedness with hypotension: no Has patient had a PCN reaction causing severe rash involving mucus membranes or skin necrosis: no Has patient had a PCN reaction that required hospitalization no Has patient had a PCN reaction occurring within the last 10 years: no If all of the  above answers are "NO", then may proceed with Cephalosporin use.   Marland Kitchen Amoxicillin Rash  . Hydrocodone Rash    Family History  Problem Relation Age of Onset  . Cancer Father     lung  . Diabetes Father   . Cancer Maternal Aunt     gyn cancer, pancreas  . Cancer Paternal Aunt     pancreas cancer  . Cancer Paternal Uncle     unk  . Cancer Sister     Prior to Admission medications   Medication Sig Start Date End Date Taking? Authorizing Provider  feeding supplement, ENSURE ENLIVE, (ENSURE ENLIVE) LIQD Take 237 mLs by mouth 3 (three) times daily between meals. 08/05/14  Yes Frederick Klinger, DO  gabapentin (NEURONTIN) 300 MG capsule Take 600 mg by mouth 3 (three) times daily.   Yes Historical Provider, MD  levETIRAcetam (KEPPRA) 250 MG tablet Take 250 mg by mouth 2 (two) times daily. 11/29/14  Yes Historical Provider, MD  meloxicam (MOBIC) 15 MG tablet Take 15 mg by mouth daily. 07/15/14  Yes Historical Provider, MD  oxyCODONE 10 MG TABS Take 1 tablet (10 mg total) by mouth 3 (three) times daily as needed for severe pain. 01/19/14  Yes Heath Lark, MD  saccharomyces boulardii (FLORASTOR) 250 MG capsule Take 1 capsule (250 mg total) by mouth 2 (two) times daily. 08/05/14  Yes Jermarion Poffenberger, DO  levofloxacin (LEVAQUIN) 750 MG tablet Take 1 tablet (750 mg total) by mouth daily. Patient not taking: Reported on 12/28/2014 08/05/14   Velta Addison Arrielle Mcginn, DO  nicotine (NICODERM CQ - DOSED IN MG/24 HOURS) 14 mg/24hr patch Place 1 patch (14 mg total) onto the skin daily. Patient not taking: Reported on 12/28/2014 08/05/14   Cristal Ford, DO   Physical Exam: Filed Vitals:   12/28/14 1409 12/28/14 1445  BP:  88/56  Pulse:  69  Temp: 99.7 F (37.6 C)   Resp:       General: Well developed, thin, NAD, appears stated age  HEENT: NCAT, PERRLA, EOMI, Anicteic Sclera, mucous membranes dry.   Neck: Supple, no JVD, no masses  Cardiovascular: S1 S2 auscultated, no rubs, murmurs or gallops. Regular  rate and rhythm.  Respiratory: Mildly diminished breath sounds on the left, otherwise clear  Abdomen: Soft, nontender, nondistended, + bowel sounds  Extremities: warm dry without cyanosis clubbing or edema  Neuro: AAOx3, cranial nerves grossly intact. Strength 5/5 in patient's upper and lower extremities bilaterally  Skin: Without rashes exudates or nodules  Psych: Normal affect and demeanor with intact judgement and insight  Labs on Admission:  Basic Metabolic Panel:  Recent Labs Lab 12/28/14 1346  NA 135  K 3.1*  CL 101  CO2 25  GLUCOSE 136*  BUN 15  CREATININE 0.84  CALCIUM 7.8*   Liver Function Tests: No results for input(s): AST, ALT, ALKPHOS, BILITOT, PROT, ALBUMIN in the last 168 hours. No results for input(s): LIPASE, AMYLASE in the last 168 hours. No results for input(s): AMMONIA in the last 168 hours. CBC:  Recent Labs Lab 12/28/14 1346  WBC 8.3  NEUTROABS 7.3  HGB 9.7*  HCT 29.8*  MCV 81.2  PLT 165   Cardiac Enzymes: No results for input(s):  CKTOTAL, CKMB, CKMBINDEX, TROPONINI in the last 168 hours.  BNP (last 3 results) No results for input(s): BNP in the last 8760 hours.  ProBNP (last 3 results) No results for input(s): PROBNP in the last 8760 hours.  CBG: No results for input(s): GLUCAP in the last 168 hours.  Radiological Exams on Admission: Dg Chest 2 View  12/28/2014  CLINICAL DATA:  Worsening cough, congestion, shortness of breath over the last few days. History of lymphoma. EXAM: CHEST  2 VIEW COMPARISON:  12/19/2014 FINDINGS: Hyperinflation of the lungs. Patchy airspace disease in the left lower lobe at the left base compatible with pneumonia. No confluent opacity on the right. Heart is normal size. No effusions. No acute bony abnormality. Stable mild compression fracture in the upper lumbar spine, likely L1. IMPRESSION: Left lower lobe pneumonia. Hyperinflation. Electronically Signed   By: Rolm Baptise M.D.   On: 12/28/2014 10:58     EKG: None  Assessment/Plan  Sepsis secondary to community acquired pneumonia -Admit to stepdown -Chest x-ray showed left lower lobe pneumonia -Upon admission, patient was febrile with tachycardia -Blood pressures were soft however have responded to fluid -Lactic acid 1 -Will place on Levaquin as patient has a vancomycin and penicillin allergy -Obtain sputum culture and Gram stain, urine Legionella and strep pneumonia urine antigens -Blood cultures pending  Acute hypoxic respiratory failure -Likely secondary to pneumonia -Continue supplemental oxygen to maintain saturation above 92% -Treatment plan as above  Hypokalemia -Potassium 3.1, will replete and continue to monitor BMP  Normocytic anemia -Hemoglobin 9.7, baseline appears to be 11-12 -Will obtain anemia panel and continue to monitor CBC  History of diffuse large B-cell lymphoma -Patient follows with Dr. Alvy Bimler -Have left a message for Dr. Alvy Bimler  Chronic pain and neuropathy -Continue oxycodone, gabapentin  Tobacco abuse -Continue nicotine patch -Discussed smoking cessation  Cachexia -Will consult nutrition  DVT prophylaxis: Lovenox  Code Status: Full  Condition: Guarded  Family Communication: None at bedside. Admission, patients condition and plan of care including tests being ordered have been discussed with the patient, who indicates understanding and agrees with the plan and Code Status.  Disposition Plan: Admitted.   Time spent: 60 minutes  Kristene Liberati D.O. [facility] Pager [phone]  If 7PM-7AM, please contact night-coverage [email or web address] Password [facility] 12/28/2014, 3:49 PM

## 2014-12-28 NOTE — ED Notes (Signed)
Ambulated pt with no O2. Pt was at %92-%93 room air resting in bed. Up to ambulate with no O2 and sats dropped to %85-%86.  Pt weak and stumbled some while walking.

## 2014-12-28 NOTE — ED Notes (Signed)
Pt returned from X Ray.

## 2014-12-28 NOTE — Procedures (Signed)
Central Venous Catheter Insertion Procedure Note Nicholas King [id number] 05-Oct-1970  Procedure: Insertion of Central Venous Catheter Indications: Assessment of intravascular volume, Drug and/or fluid administration and Frequent blood sampling  Procedure Details Consent: Risks of procedure as well as the alternatives and risks of each were explained to the (patient/caregiver).  Consent for procedure obtained. Time Out: Verified patient identification, verified procedure, site/side was marked, verified correct patient position, special equipment/implants available, medications/allergies/relevent history reviewed, required imaging and test results available.  Performed  Maximum sterile technique was used including antiseptics, cap, gloves, gown, hand hygiene, mask and sheet. Skin prep: Chlorhexidine; local anesthetic administered A antimicrobial bonded/coated triple lumen catheter was placed in the left internal jugular vein using the Seldinger technique.  Evaluation Blood flow good Complications: No apparent complications Patient did tolerate procedure well. Chest X-ray ordered to verify placement.  CXR: pending.  Procedure performed under direct ultrasound guidance for real time vessel cannulation.      [name], [place] [facility] Pager: [phone]  or [phone] 12/28/2014, 10:17 PM  [name], M.D. [facility] Pulmonary/Critical Care Medicine. Pager: [phone]. After hours pager: [phone].

## 2014-12-28 NOTE — ED Notes (Signed)
Per pt/family-states cold symptoms, cough for a few days-states history of cancer
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
## 2014-12-28 NOTE — Progress Notes (Signed)
Patient is hypotensive. Patient is bradycardiac. Pt is symptomatic, confused with slurred speech and poor attention. 1 liter bolus given per ACLS protocol. NP contacted for update about patient condition.

## 2014-12-28 NOTE — Progress Notes (Signed)
Post bolus BP 81/50. Patient hypothermic with a 93 degree temp rectal.

## 2014-12-28 NOTE — ED Notes (Signed)
MD [facility] notified of updated abnormal temperature

## 2014-12-28 NOTE — ED Notes (Signed)
Lab orders d/c

## 2014-12-28 NOTE — ED Notes (Signed)
MD [facility] notified of abnormal temperature.

## 2014-12-28 NOTE — ED Notes (Signed)
MD [facility] notified of drop in blood pressure
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
## 2014-12-29 DIAGNOSIS — J189 Pneumonia, unspecified organism: Secondary | ICD-10-CM

## 2014-12-29 DIAGNOSIS — R6521 Severe sepsis with septic shock: Secondary | ICD-10-CM

## 2014-12-29 DIAGNOSIS — R4182 Altered mental status, unspecified: Secondary | ICD-10-CM

## 2014-12-29 DIAGNOSIS — E876 Hypokalemia: Secondary | ICD-10-CM

## 2014-12-29 LAB — BASIC METABOLIC PANEL
Anion gap: 3 — ABNORMAL LOW (ref 5–15)
BUN: 7 mg/dL (ref 6–20)
CHLORIDE: 113 mmol/L — AB (ref 101–111)
CO2: 23 mmol/L (ref 22–32)
CREATININE: 0.45 mg/dL — AB (ref 0.61–1.24)
Calcium: 7.5 mg/dL — ABNORMAL LOW (ref 8.9–10.3)
GFR calc non Af Amer: >60 mL/min (ref >60)
Glucose, Bld: 125 mg/dL — ABNORMAL HIGH (ref 65–99)
Potassium: 3.8 mmol/L (ref 3.5–5.1)
Sodium: 139 mmol/L (ref 135–145)

## 2014-12-29 LAB — HIV ANTIBODY (ROUTINE TESTING W REFLEX): HIV Screen 4th Generation wRfx: NONREACTIVE

## 2014-12-29 MED ORDER — SODIUM CHLORIDE 0.9 % IV BOLUS (SEPSIS)
1000.0000 mL | Freq: Once | INTRAVENOUS | Status: AC
  Administered 2014-12-29: 1000 mL via INTRAVENOUS

## 2014-12-29 MED ORDER — POTASSIUM CHLORIDE CRYS ER 20 MEQ PO TBCR
40.0000 meq | EXTENDED_RELEASE_TABLET | Freq: Once | ORAL | Status: AC
  Administered 2014-12-29: 40 meq via ORAL
  Filled 2014-12-29: qty 2

## 2014-12-29 MED ORDER — GUAIFENESIN ER 600 MG PO TB12
1200.0000 mg | ORAL_TABLET | Freq: Two times a day (BID) | ORAL | Status: DC
  Administered 2014-12-29: 1200 mg via ORAL
  Filled 2014-12-29 (×6): qty 2

## 2014-12-29 MED ORDER — SODIUM CHLORIDE 0.9 % IV BOLUS (SEPSIS)
1000.0000 mL | Freq: Once | INTRAVENOUS | Status: AC
  Administered 2014-12-29: 03:00:00 via INTRAVENOUS
  Administered 2014-12-29: 1000 mL via INTRAVENOUS

## 2014-12-29 MED ORDER — GUAIFENESIN-DM 100-10 MG/5ML PO SYRP
5.0000 mL | ORAL_SOLUTION | ORAL | Status: DC | PRN
  Administered 2014-12-29 (×2): 5 mL via ORAL
  Filled 2014-12-29 (×2): qty 10

## 2014-12-29 NOTE — Clinical Documentation Improvement (Signed)
Internal Medicine Critical Care  Based on the clinical findings below, please document any associated diagnoses/conditions the patient has or may have.   Malnutrition (if present, please identify severity - mild, moderate, severe - and type if known)  Other  Clinically Undetermined  Supporting Information: Cachexia currently documented. Height documented at 5 feet 7 inches, weight 101 pounds 3.1 ounces, BMI 15.85.     Please exercise your independent, professional judgment when responding. A specific answer is not anticipated or expected.Please update your documentation within the medical record to reflect your response to this query.   Thank you, Mateo Flow, RN [phone] Clinical Documentation Specialist

## 2014-12-29 NOTE — Progress Notes (Signed)
Initial Nutrition Assessment  DOCUMENTATION CODES:   Non-severe (moderate) malnutrition in context of chronic illness  INTERVENTION:  Ensure Enlive po TID, each supplement provides 350 kcal and 20 grams of protein    NUTRITION DIAGNOSIS:   Increased nutrient needs related to cancer and cancer related treatments, catabolic illness as evidenced by estimated needs.  GOAL:   Patient will meet greater than or equal to 90% of their needs   MONITOR:   PO intake, I & O's, Supplement acceptance, Labs, Weight trends  REASON FOR ASSESSMENT:   Consult Assessment of nutrition requirement/status  ASSESSMENT:   Nicholas King is a 44 yo male With a history of lymphoma, chronic pain, presented to the emergency department with complaints of cough and shortness of breath. Patient states he has been coughing for approximately 1 week. His mother has also been sick. Patient states his sputum has been white to yellow in color. He has not tried taking any over-the-counter medications. He does feel that he has been somewhat short of breath. He also admits to having fever at home.   Spoke with pt at bedside. Said he dropped down from 140# to 87# when diagnosed with lymphoma.  States he has gotten himself back up to 114#, but has trouble gaining more wt now. Per chart, he is showing 13#/11% severe wt loss in 1 week. Likely related to fluid/dehydration, AKI. Denies nausea/vomitting/constipation, had some diarrhea this morning.  Pt states his appetite is pretty good, tries to eat everything he sees. Was drinking Ensure at home, but currently is not, will provide during stay.  Pt exhibits mild malnutrition related to previous lymphoma (in remission 2 years), as evidenced by mild depletion of body fat and muscle mass, severe wt loss.  Suspect malabsorption related to cancer & related treatments.  Labs: K 3.1, Ca 7.8 Medications reviewed.  Diet Order:  Diet Heart Room service appropriate?: Yes;  Fluid consistency:: Thin  Skin:  Reviewed, no issues  Last BM:  12/24/2014  Height:   Ht Readings from Last 1 Encounters:  12/28/14 5\' 7"  (1.702 m)    Weight:   Wt Readings from Last 1 Encounters:  12/28/14 101 lb 3.1 oz (45.9 kg)    Ideal Body Weight:  67.27 kg  BMI:  Body mass index is 15.85 kg/(m^2).  Estimated Nutritional Needs:   Kcal:  1800-2000  Protein:  45 - 70grams  Fluid:  >/= 1.8L  EDUCATION NEEDS:   No education needs identified at this time  Satira Anis. Nicholas Heninger, MS, RD LDN After Hours/Weekend Pager [phone]

## 2014-12-29 NOTE — Care Management Note (Signed)
Case Management Note  Patient Details  Name: Nicholas King MRN: [id number] Date of Birth: Jun 21, 1970  Subjective/Objective:             Sepsis and hypotensive       Action/Plan:Date: December 29, 2014 Chart reviewed for concurrent status and case management needs. Will continue to follow patient for changes and needs: Velva Harman, RN, BSN, [place]   [phone]   Expected Discharge Date:   (unknown)               Expected Discharge Plan:  Home/Self Care  In-House Referral:  NA  Discharge planning Services  CM Consult  Post Acute Care Choice:  NA Choice offered to:  NA  DME Arranged:    DME Agency:     HH Arranged:    HH Agency:     Status of Service:  In process, will continue to follow  Medicare Important Message Given:    Date Medicare IM Given:    Medicare IM give by:    Date Additional Medicare IM Given:    Additional Medicare Important Message give by:     If discussed at [place] of Stay Meetings, dates discussed:    Additional Comments:  Leeroy Cha, RN 12/29/2014, 12:20 PM

## 2014-12-29 NOTE — Consult Note (Signed)
PULMONARY / CRITICAL CARE MEDICINE   Name: Nicholas King MRN: [id number] DOB: 02/14/1970    ADMISSION DATE:  12/28/2014 CONSULTATION DATE:  12/28/2014  REFERRING MD :  TRH  CHIEF COMPLAINT:  Septic shock  HISTORY OF PRESENT ILLNESS:   44 year old with history of lymphoma and chronic pain who presents to the hospital with cough and SOB x1 week. Febrile at home with sick contact being his mother.  Also reports DOE and extent of treatment of his lymphoma is unclear (states it has not been treated but not sure about details).  Reports cough and yellow sputum production.  Reports fever at home but not sure how high.  He is a very poor historian.  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  PAST SURGICAL HISTORY: He  has past surgical history that includes No past surgeries; Direct laryngoscopy (Left, 05/08/2012); and right eyebrown bone.  Allergies  Allergen Reactions  . Vancomycin Itching and Rash    Extreme Itching, red skin from head to torso.    Marland Kitchen Cymbalta [Duloxetine Hcl] Other (See Comments)    Suicidal and "crazy"  . Penicillins Hives    Has patient had a PCN reaction causing immediate rash, facial/tongue/throat swelling, SOB or lightheadedness with hypotension: no Has patient had a PCN reaction causing severe rash involving mucus membranes or skin necrosis: no Has patient had a PCN reaction that required hospitalization no Has patient had a PCN reaction occurring within the last 10 years:  no If all of the above answers are "NO", then may proceed with Cephalosporin use.   Marland Kitchen Amoxicillin Rash  . Hydrocodone Rash    No current facility-administered medications on file prior to encounter.   Current Outpatient Prescriptions on File Prior to Encounter  Medication Sig  . feeding supplement, ENSURE ENLIVE, (ENSURE ENLIVE) LIQD Take 237 mLs by mouth 3 (three) times daily between meals.  . gabapentin (NEURONTIN) 300 MG capsule Take 600 mg by mouth 3 (three) times daily.  . meloxicam (MOBIC) 15 MG tablet Take 15 mg by mouth daily.  Marland Kitchen oxyCODONE 10 MG TABS Take 1 tablet (10 mg total) by mouth 3 (three) times daily as needed for severe pain.  Marland Kitchen saccharomyces boulardii (FLORASTOR) 250 MG capsule Take 1 capsule (250 mg total) by mouth 2 (two) times daily.  Marland Kitchen levofloxacin (LEVAQUIN) 750 MG tablet Take 1 tablet (750 mg total) by mouth daily. (Patient not taking: Reported on 12/28/2014)  . nicotine (NICODERM CQ - DOSED IN MG/24 HOURS) 14 mg/24hr patch Place 1 patch (14 mg total) onto the skin daily. (Patient not taking: Reported on 12/28/2014)    FAMILY HISTORY:  His indicated that his mother is alive. He indicated that his father is deceased. He indicated that his sister is alive. He indicated that his brother is alive. He indicated that his maternal aunt is deceased. He indicated that his paternal aunt is deceased. He indicated that his paternal uncle is deceased.   SOCIAL HISTORY: He  reports that he has been smoking Cigarettes.  He has a 12.5 pack-year smoking history. He has never used smokeless tobacco. He reports that he does not drink alcohol or  use illicit drugs.  REVIEW OF SYSTEMS:   12 point ROS is negative other than above.  SUBJECTIVE: C/O penile pain from foley that he is continuously pulling on per nursing staff.  VITAL SIGNS: BP 87/51 mmHg  Pulse 75  Temp(Src) 95.4 F (35.2 C) (Core (Comment))  Resp 17  Ht 5\' 7"  (1.702 m)  Wt 45.9 kg (101 lb 3.1 oz)  BMI 15.85 kg/m2   SpO2 97%  HEMODYNAMICS: CVP:  [0 mmHg-7 mmHg] 5 mmHg  VENTILATOR SETTINGS:    INTAKE / OUTPUT: I/O last 3 completed shifts: In: 390 [P.O.:240; I.V.:150] Out: -   PHYSICAL EXAMINATION: General:  Chronically ill appearing male, agitated.  Emaciated.  Neuro:  Awake and interactive but confused at time.  Moving all ext to command. HEENT:  /AT, PERRL, EOM-I and MMM. Cardiovascular:  RRR, nl S1/S2, -M/R/G. Lungs:  CTA bilaterally. Abdomen:  Soft, NT, ND and +BS. Musculoskeletal:  -edema and -tenderness. Skin:  Intact.  LABS:  CBC  Recent Labs Lab 12/28/14 1346  WBC 8.3  HGB 9.7*  HCT 29.8*  PLT 165   Coag's No results for input(s): APTT, INR in the last 168 hours. BMET  Recent Labs Lab 12/28/14 1346  NA 135  K 3.1*  CL 101  CO2 25  BUN 15  CREATININE 0.84  GLUCOSE 136*   Electrolytes  Recent Labs Lab 12/28/14 1346  CALCIUM 7.8*   Sepsis Markers  Recent Labs Lab 12/28/14 1331 12/28/14 1817  LATICACIDVEN 1.0  --   PROCALCITON  --  0.92   ABG No results for input(s): PHART, PCO2ART, PO2ART in the last 168 hours. Liver Enzymes No results for input(s): AST, ALT, ALKPHOS, BILITOT, ALBUMIN in the last 168 hours. Cardiac Enzymes No results for input(s): TROPONINI, PROBNP in the last 168 hours. Glucose No results for input(s): GLUCAP in the last 168 hours.  Imaging Dg Chest 1 View  12/28/2014  CLINICAL DATA:  New left IJ central venous catheter. Diaphoresis. Weakness and diarrhea. EXAM: CHEST 1 VIEW COMPARISON:  Multiple exams, including 12/28/2014 FINDINGS: Left IJ line tip:  SVC.  No pneumothorax. Mild interstitial accentuation in both lungs. Streaky opacities in the retrocardiac position of the left lower lobe. No pleural effusion.  Heart size within normal limits. IMPRESSION: 1. Streaky opacities in the left lower lobe suspicious for pneumonia. 2. Left IJ line tip:  SVC.  No pneumothorax. 3. Generalized interstitial accentuation in the lungs,  mild. Electronically Signed   By: Van Clines M.D.   On: 12/28/2014 22:43   Dg Chest 2 View  12/28/2014  CLINICAL DATA:  Worsening cough, congestion, shortness of breath over the last few days. History of lymphoma. EXAM: CHEST  2 VIEW COMPARISON:  12/19/2014 FINDINGS: Hyperinflation of the lungs. Patchy airspace disease in the left lower lobe at the left base compatible with pneumonia. No confluent opacity on the right. Heart is normal size. No effusions. No acute bony abnormality. Stable mild compression fracture in the upper lumbar spine, likely L1. IMPRESSION: Left lower lobe pneumonia. Hyperinflation. Electronically Signed   By: Rolm Baptise M.D.   On: 12/28/2014 10:58   STUDIES:  11/22 LLL infiltrate.  CULTURES: Blood 11/22>>> Urine 11/22>>> Sputum 11/22>>>  ANTIBIOTICS: Levofloxacin 11/22>>>  SIGNIFICANT EVENTS: 11/22 admission to the ICU for hypotension due to sepsis  LINES/TUBES: L IJ TLC 11/22>>>  DISCUSSION: 44 year old male with history of lymphoma unsure how it was treated or if patient is immune compromised or not presenting with CAP and septic  shock.  PCCM consulted for hypotension.  ASSESSMENT / PLAN:  PULMONARY A: LLL PNA. P:   - Continue levaquin for CAP coverage specially that procalcitonin is dropping. - If procalcitonin rises then would change to HCAP. - Titrate O2 for sat of 88-92%.  CARDIOVASCULAR A:  Septic shock. P:  - IVF resuscitation. - Follow CVP. - Abx as below. - Levophed as needed for BP support.  RENAL A:   Hypokalemia. P:   - Replace electrolytes as indicated. - BMET in AM. - IVF resuscitation. - Follow CVP.  GASTROINTESTINAL A:   No active issues. P:   - Protonix. - Diet as ordered.  HEMATOLOGIC A:   Anemia. P:  - Transfuse per ICU protocol. - Monitor for now. - CBC in AM.  INFECTIOUS A:   LLL PNA without WBC rise. P:   - Needs to ascertain if patient received chemo or not an status of his lymphoma  treatment. - Levaquin as above.  ENDOCRINE A:   No active issues.   P:   - Check cortisol level. - Supplement as needed.  NEUROLOGIC A:   Agitation and confusion, unsure of baseline. Chronic pain patient. P:   - Avoid sedating medications as able. - Monitor closely. - Need to establish baseline mental status.  The patient is critically ill with multiple organ systems failure and requires high complexity decision making for assessment and support, frequent evaluation and titration of therapies, application of advanced monitoring technologies and extensive interpretation of multiple databases.   Critical Care Time devoted to patient care services described in this note is  35  Minutes. This time reflects time of care of this signee Dr Jennet Maduro. This critical care time does not reflect procedure time, or teaching time or supervisory time of PA/NP/Med student/Med Resident etc but could involve care discussion time.  [name], M.D. [facility] Pulmonary/Critical Care Medicine. Pager: [phone]. After hours pager: [phone].  12/29/2014, 1:20 AM

## 2014-12-29 NOTE — Progress Notes (Signed)
PULMONARY / CRITICAL CARE MEDICINE   Name: Nicholas King MRN: [id number] DOB: Mar 18, 1970    ADMISSION DATE:  12/28/2014 CONSULTATION DATE:  12/28/2014  REFERRING MD :  TRH  CHIEF COMPLAINT:  Septic shock  HISTORY OF PRESENT ILLNESS:   44 year old with history of lymphoma (rx with chemo 2014, unclear if any rx since that time) and chronic pain presented 11/22 with 1 week hx cough with yellow sputum, SOB, fever.  Admitted by [facility] with CAP, sepsis.  Overnight had worsening hypotension, decreased UOP and PCCM consulted 11/23 for ICU tx.   SUBJECTIVE: C/O penile pain from foley that he is continuously pulling on per nursing staff.  VITAL SIGNS: BP 105/72 mmHg  Pulse 93  Temp(Src) 96.6 F (35.9 C) (Core (Comment))  Resp 22  Ht 5\' 7"  (1.702 m)  Wt 101 lb 3.1 oz (45.9 kg)  BMI 15.85 kg/m2  SpO2 99%  HEMODYNAMICS: CVP:  [0 mmHg-8 mmHg] 2 mmHg  VENTILATOR SETTINGS:    INTAKE / OUTPUT: I/O last 3 completed shifts: In: 8927.5 [P.O.:240; I.V.:937.5; IV [id number] Out: [id number] [Urine:750; Stool:1]  PHYSICAL EXAMINATION: General:  Chronically ill appearing male, emaciated, NAD  Neuro:  Awake and interactive, very poor historian. Moving all ext to command. HEENT:  [place]/AT, PERRL, EOM-I and MMM. Cardiovascular:  RRR, nl S1/S2, -M/R/G. Lungs:  resps even non labored on RA, CTA bilaterally. Abdomen:  Soft, NT, ND and +BS. Musculoskeletal:  -edema and -tenderness. Skin:  Intact.  LABS:  CBC  Recent Labs Lab 12/28/14 1346  WBC 8.3  HGB 9.7*  HCT 29.8*  PLT 165   Coag's No results for input(s): APTT, INR in the last 168 hours. BMET  Recent Labs Lab 12/28/14 1346  NA 135  K 3.1*  CL 101  CO2 25  BUN 15  CREATININE 0.84  GLUCOSE 136*   Electrolytes  Recent Labs Lab 12/28/14 1346  CALCIUM 7.8*   Sepsis Markers  Recent Labs Lab 12/28/14 1331 12/28/14 1817  LATICACIDVEN 1.0  --   PROCALCITON  --  0.92   ABG No results for input(s): PHART, PCO2ART,  PO2ART in the last 168 hours. Liver Enzymes No results for input(s): AST, ALT, ALKPHOS, BILITOT, ALBUMIN in the last 168 hours. Cardiac Enzymes No results for input(s): TROPONINI, PROBNP in the last 168 hours. Glucose No results for input(s): GLUCAP in the last 168 hours.  Imaging Dg Chest 1 View  12/28/2014  CLINICAL DATA:  New left IJ central venous catheter. Diaphoresis. Weakness and diarrhea. EXAM: CHEST 1 VIEW COMPARISON:  Multiple exams, including 12/28/2014 FINDINGS: Left IJ line tip:  SVC.  No pneumothorax. Mild interstitial accentuation in both lungs. Streaky opacities in the retrocardiac position of the left lower lobe. No pleural effusion.  Heart size within normal limits. IMPRESSION: 1. Streaky opacities in the left lower lobe suspicious for pneumonia. 2. Left IJ line tip:  SVC.  No pneumothorax. 3. Generalized interstitial accentuation in the lungs, mild. Electronically Signed   By: Van Clines M.D.   On: 12/28/2014 22:43   Dg Chest 2 View  12/28/2014  CLINICAL DATA:  Worsening cough, congestion, shortness of breath over the last few days. History of lymphoma. EXAM: CHEST  2 VIEW COMPARISON:  12/19/2014 FINDINGS: Hyperinflation of the lungs. Patchy airspace disease in the left lower lobe at the left base compatible with pneumonia. No confluent opacity on the right. Heart is normal size. No effusions. No acute bony abnormality. Stable mild compression fracture in the upper lumbar spine,  likely L1. IMPRESSION: Left lower lobe pneumonia. Hyperinflation. Electronically Signed   By: Rolm Baptise M.D.   On: 12/28/2014 10:58   STUDIES:  11/22 LLL infiltrate.  CULTURES: Blood 11/22>>> Urine 11/22>>> Sputum 11/22>>>  ANTIBIOTICS: Levofloxacin 11/22>>>  SIGNIFICANT EVENTS: 11/22 admission to the ICU for hypotension due to sepsis  LINES/TUBES: L IJ TLC 11/22>>>  DISCUSSION: 44 year old male with history of lymphoma unsure how/if it was treated presenting with CAP and  septic shock.  PCCM consulted for hypotension.  ASSESSMENT / PLAN:  PULMONARY A: LLL PNA. P:   - Continue levaquin for CAP coverage  - If procalcitonin rises (currently trending down) then change to HCAP coverage  - Titrate O2 for sat of 88-92%.  CARDIOVASCULAR A:  Septic shock - improved.  P:  - IVF resuscitation. - Follow CVP. - Abx as below.   RENAL A:   Hypokalemia. P:   - Replace electrolytes as indicated. - BMET now and in am  - IVF resuscitation. - Follow CVP.  GASTROINTESTINAL A:   No active issues. P:   - Protonix. - Diet as ordered.  HEMATOLOGIC A:   Anemia. P:  - Transfuse per ICU protocol. - Monitor for now. - CBC in AM.  INFECTIOUS A:   LLL PNA without WBC rise. Hx lymphoma -- s/p chemo (R-CHOP) in 2014.  F/u scans have been stable but ongoing weight loss concerning.  P:   - Levaquin as above.  ENDOCRINE A:   No active issues.   P:   - Check cortisol level. - Supplement as needed.  NEUROLOGIC A:   Agitation and confusion, unsure of baseline. Chronic pain patient. P:   - Avoid sedating medications as able. - Monitor closely. - Need to establish baseline mental status.   tx SDU.  PCCM signing off please call back if needed.     Nickolas Madrid, NP 12/29/2014  9:51 AM Pager: [phone] or [phone]

## 2014-12-29 NOTE — Progress Notes (Signed)
[facility] PROGRESS NOTE   Nicholas King ACZ:[id number] DOB: [birth year] DOA: 12/28/2014 PCP: No primary care provider on file.  HPI/Subjective: Feels better today, still has cough with minimal sputum production. Had some hypothermia last night and has to be on Bair hugger  Assessment/Plan: Principal Problem:   Sepsis ([place]) Active Problems:   Cachexia ([place])   Diffuse large B cell lymphoma ([place])   Neuropathy ([place])   Tobacco abuse   CAP (community acquired pneumonia)   Encounter for central line placement   Severe sepsis Sepsis criteria met with fever of 103.6, heart rate of 122 and presents of pneumonia on admission. Severe sepsis because of development of hypotension, blood pressure went down to 72/42 mmHg. Also developed hypothermia and needed Worman blanket. On Levaquin as patient has a vancomycin and penicillin allergy Obtain sputum culture and Gram stain, urine Legionella and strep pneumonia urine antigens Blood cultures pending. PCCM consulted, central line placed for monitoring of CVP and delivering of antibiotics.  Left-sided community-acquired pneumonia Lobar pneumonia, community-acquired, presented with severe sepsis as above. Patient is on levofloxacin. Blunt and sputum culture pending, urine for Legionella and streptococcus antigens pending. Supportive management with mucolytics, antitussives and bronchodilators.  Acute hypoxic respiratory failure -Likely secondary to pneumonia -Continue supplemental oxygen to maintain saturation above 92% -Treatment plan as above  Hypokalemia -Potassium 3.1, will replete and continue to monitor BMP  Normocytic anemia -Hemoglobin 9.7, baseline appears to be 11-12 -Will obtain anemia panel and continue to monitor CBC  History of diffuse large B-cell lymphoma -Patient follows with Dr. Alvy Bimler -Message for Dr. Alvy Bimler  Chronic pain and neuropathy -Continue oxycodone, gabapentin  Tobacco abuse -Continue  nicotine patch -Discussed smoking cessation  Cachexia -Will consult nutrition  Code Status: Full Code Family Communication: Plan discussed with the patient. Disposition Plan: Remains inpatient Diet: Diet Heart Room service appropriate?: Yes; Fluid consistency:: Thin  Consultants:  PCCM  Procedures:  Central venous line placed on 12/29/14  Antibiotics:  Levofloxacin   Objective: Filed Vitals:   12/29/14 0900 12/29/14 1000  BP: 105/72 102/70  Pulse: 93 86  Temp: 96.6 F (35.9 C) 97.5 F (36.4 C)  Resp: 22 20    Intake/Output Summary (Last 24 hours) at 12/29/14 1046 Last data filed at 12/29/14 0900  Gross per 24 hour  Intake 9077.5 ml  Output   1051 ml  Net 8026.5 ml   Filed Weights   12/28/14 1625  Weight: 45.9 kg (101 lb 3.1 oz)    Exam: General: Alert and awake, oriented x3, not in any acute distress. HEENT: anicteric sclera, pupils reactive to light and accommodation, EOMI CVS: S1-S2 clear, no murmur rubs or gallops Chest: clear to auscultation bilaterally, no wheezing, rales or rhonchi Abdomen: soft nontender, nondistended, normal bowel sounds, no organomegaly Extremities: no cyanosis, clubbing or edema noted bilaterally Neuro: Cranial nerves II-XII intact, no focal neurological deficits  Data Reviewed: Basic Metabolic Panel:  Recent Labs Lab 12/28/14 1346  NA 135  K 3.1*  CL 101  CO2 25  GLUCOSE 136*  BUN 15  CREATININE 0.84  CALCIUM 7.8*   Liver Function Tests: No results for input(s): AST, ALT, ALKPHOS, BILITOT, PROT, ALBUMIN in the last 168 hours. No results for input(s): LIPASE, AMYLASE in the last 168 hours. No results for input(s): AMMONIA in the last 168 hours. CBC:  Recent Labs Lab 12/28/14 1346  WBC 8.3  NEUTROABS 7.3  HGB 9.7*  HCT 29.8*  MCV 81.2  PLT 165   Cardiac Enzymes: No results  for input(s): CKTOTAL, CKMB, CKMBINDEX, TROPONINI in the last 168 hours. BNP (last 3 results) No results for input(s): BNP in the  last 8760 hours.  ProBNP (last 3 results) No results for input(s): PROBNP in the last 8760 hours.  CBG: No results for input(s): GLUCAP in the last 168 hours.  Micro Recent Results (from the past 240 hour(s))  MRSA PCR Screening     Status: Abnormal   Collection Time: 12/28/14  4:42 PM  Result Value Ref Range Status   MRSA by PCR POSITIVE (A) NEGATIVE Final    Comment:        The GeneXpert MRSA Assay (FDA approved for NASAL specimens only), is one component of a comprehensive MRSA colonization surveillance program. It is not intended to diagnose MRSA infection nor to guide or monitor treatment for MRSA infections. RESULT CALLED TO, READ BACK BY AND VERIFIED WITH: Zannie Cove [id number] @ [place]      Studies: Dg Chest 1 View  12/28/2014  CLINICAL DATA:  New left IJ central venous catheter. Diaphoresis. Weakness and diarrhea. EXAM: CHEST 1 VIEW COMPARISON:  Multiple exams, including 12/28/2014 FINDINGS: Left IJ line tip:  SVC.  No pneumothorax. Mild interstitial accentuation in both lungs. Streaky opacities in the retrocardiac position of the left lower lobe. No pleural effusion.  Heart size within normal limits. IMPRESSION: 1. Streaky opacities in the left lower lobe suspicious for pneumonia. 2. Left IJ line tip:  SVC.  No pneumothorax. 3. Generalized interstitial accentuation in the lungs, mild. Electronically Signed   By: Van Clines M.D.   On: 12/28/2014 22:43   Dg Chest 2 View  12/28/2014  CLINICAL DATA:  Worsening cough, congestion, shortness of breath over the last few days. History of lymphoma. EXAM: CHEST  2 VIEW COMPARISON:  12/19/2014 FINDINGS: Hyperinflation of the lungs. Patchy airspace disease in the left lower lobe at the left base compatible with pneumonia. No confluent opacity on the right. Heart is normal size. No effusions. No acute bony abnormality. Stable mild compression fracture in the upper lumbar spine, likely L1. IMPRESSION: Left lower  lobe pneumonia. Hyperinflation. Electronically Signed   By: Rolm Baptise M.D.   On: 12/28/2014 10:58    Scheduled Meds: . Chlorhexidine Gluconate Cloth  6 each Topical Q0600  . enoxaparin (LOVENOX) injection  40 mg Subcutaneous Q24H  . feeding supplement (ENSURE ENLIVE)  237 mL Oral TID BM  . gabapentin  600 mg Oral TID  . levETIRAcetam  250 mg Oral BID  . levofloxacin (LEVAQUIN) IV  750 mg Intravenous Q24H  . meloxicam  15 mg Oral Daily  . mupirocin ointment  1 application Nasal BID  . nicotine  14 mg Transdermal Daily  . saccharomyces boulardii  250 mg Oral BID   Continuous Infusions: . sodium chloride 75 mL/hr at 12/29/14 0900       Time spent: 35 minutes    [facility] A  [facility] Pager [phone] If 7PM-7AM, please contact night-coverage at [email or web address], password [facility] 12/29/2014, 10:46 AM  LOS: 1 day

## 2014-12-29 NOTE — Progress Notes (Signed)
Shift event note:  Notified by RN regarding persistent hypotension. BP's soft most of day but SBP" now in 60-70's. At bedside pt noted awake and able to converse and ask questions. BBS diminished L>R w/ scattered rhonchi on (L). Abd is flat, soft, nt w/ hyperactive bs. Currently BP improving after 2L NS. Current BP 95/71. No UOP noted this shift.  Initially w/ fever > 103 then hypohtermic. Remains hypothermic (94.1), bair hugger in place.  Discussed w/ pt why we are currently holding pain meds and he appears aggreeable.  Assessment/Plan: 1. Persistent hypotension: Septic shock. Continue fluid resuscitation. BP appears to be improving. Will place temp/foley to closely monitor I/O. Continue to hold opiates for now. 2. Hypothermia: Associated w/ sepsis. Continue Visual merchandiser. Pt's level of care has been changed to ICU given change in status and associated increased acuity. Will continue to monitor closely in ICU.  Jeryl Columbia, NP-C [facility] [phone]

## 2014-12-30 ENCOUNTER — Inpatient Hospital Stay (HOSPITAL_COMMUNITY): Payer: Medicare Other

## 2014-12-30 DIAGNOSIS — R636 Underweight: Secondary | ICD-10-CM | POA: Diagnosis present

## 2014-12-30 LAB — CBC
HCT: 31.7 % — ABNORMAL LOW (ref 39.0–52.0)
HEMOGLOBIN: 10.3 g/dL — AB (ref 13.0–17.0)
MCH: 26.3 pg (ref 26.0–34.0)
MCHC: 32.5 g/dL (ref 30.0–36.0)
MCV: 81.1 fL (ref 78.0–100.0)
PLATELETS: 214 10*3/uL (ref 150–400)
RBC: 3.91 MIL/uL — AB (ref 4.22–5.81)
RDW: 14.7 % (ref 11.5–15.5)
WBC: 4.6 10*3/uL (ref 4.0–10.5)

## 2014-12-30 LAB — STREP PNEUMONIAE URINARY ANTIGEN: Strep Pneumo Urinary Antigen: NEGATIVE

## 2014-12-30 LAB — BASIC METABOLIC PANEL
Anion gap: 7 (ref 5–15)
CALCIUM: 8.4 mg/dL — AB (ref 8.9–10.3)
CHLORIDE: 108 mmol/L (ref 101–111)
CO2: 25 mmol/L (ref 22–32)
CREATININE: 0.52 mg/dL — AB (ref 0.61–1.24)
Glucose, Bld: 99 mg/dL (ref 65–99)
Potassium: 3.6 mmol/L (ref 3.5–5.1)
SODIUM: 140 mmol/L (ref 135–145)

## 2014-12-30 LAB — MAGNESIUM: MAGNESIUM: 1.7 mg/dL (ref 1.7–2.4)

## 2014-12-30 MED ORDER — POTASSIUM CHLORIDE CRYS ER 20 MEQ PO TBCR
40.0000 meq | EXTENDED_RELEASE_TABLET | Freq: Once | ORAL | Status: AC
  Administered 2014-12-30: 40 meq via ORAL
  Filled 2014-12-30: qty 2

## 2014-12-30 MED ORDER — OXYCODONE HCL 5 MG PO TABS
10.0000 mg | ORAL_TABLET | Freq: Four times a day (QID) | ORAL | Status: DC | PRN
  Administered 2014-12-30 – 2014-12-31 (×4): 10 mg via ORAL
  Filled 2014-12-30 (×4): qty 2

## 2014-12-30 NOTE — Progress Notes (Signed)
Patient transferred to [facility] room 1521. Report received from ICU nurse. Patient alert and oriented and in no acute distress, eating lunch.  Oriented to staff, room, and call bell.  Patient verbalized understanding.  Vitals obtained and patient assessed.  No telemetry, patient denies needs at this time.

## 2014-12-30 NOTE — Progress Notes (Signed)
[facility] PROGRESS NOTE   Nicholas King YQM:[id number] DOB: [birth year] DOA: 12/28/2014 PCP: No primary care provider on file.  HPI/Subjective: Feels better, denies any complaints, start to have some productive cough. Blood pressure and temperature stable since yesterday.  Assessment/Plan: Principal Problem:   Sepsis ([place]) Active Problems:   Cachexia ([place])   Diffuse large B cell lymphoma ([place])   Neuropathy ([facility])   Tobacco abuse   CAP (community acquired pneumonia)   Encounter for central line placement   Underweight   Severe sepsis Sepsis criteria met with fever of 103.6, heart rate of 122 and presents of pneumonia on admission. Severe sepsis because of development of hypotension, blood pressure went down to 72/42 mmHg. Also developed hypothermia and needed Worman blanket. On Levaquin as patient has a vancomycin and penicillin allergy Obtain sputum culture and Gram stain, urine Legionella and strep pneumonia urine antigens Blood cultures pending. PCCM consulted, central line placed for monitoring of CVP and delivering of antibiotics. Blood pressure and temperature stable since yesterday, transferred to regular bed.  Left-sided community-acquired pneumonia Lobar pneumonia, community-acquired, presented with severe sepsis as above. Patient is on levofloxacin. Blunt and sputum culture pending, urine for Legionella and streptococcus antigens pending. Supportive management with mucolytics, antitussives and bronchodilators.  Acute hypoxic respiratory failure -Likely secondary to pneumonia -Continue supplemental oxygen to maintain saturation above 92% -Treatment plan as above  Hypokalemia -Repleted with oral supplements.  Normocytic anemia -Hemoglobin 9.7, baseline appears to be 11-12 -Will obtain anemia panel and continue to monitor CBC  History of diffuse large B-cell lymphoma -Patient follows with Dr. Alvy Bimler -Message for Dr. Alvy Bimler  Chronic pain and  neuropathy -Continue oxycodone, gabapentin  Tobacco abuse -Continue nicotine patch -Discussed smoking cessation  Cachexia -Per dietitian moderate malnutrition, patient also is thinly built.  Code Status: Full Code Family Communication: Plan discussed with the patient. Disposition Plan: Remains inpatient, transferred to regular bed. Diet: Diet regular Room service appropriate?: Yes; Fluid consistency:: Thin  Consultants:  PCCM  Procedures:  Central venous line placed on 12/29/14  Antibiotics:  Levofloxacin   Objective: Filed Vitals:   12/30/14 0800 12/30/14 0900  BP: 128/89 141/72  Pulse: 96 81  Temp: 98.9 F (37.2 C)   Resp: 27 18    Intake/Output Summary (Last 24 hours) at 12/30/14 1006 Last data filed at 12/30/14 0900  Gross per 24 hour  Intake   1875 ml  Output   1950 ml  Net    -75 ml   Filed Weights   12/28/14 1625  Weight: 45.9 kg (101 lb 3.1 oz)    Exam: General: Alert and awake, oriented x3, not in any acute distress. HEENT: anicteric sclera, pupils reactive to light and accommodation, EOMI CVS: S1-S2 clear, no murmur rubs or gallops Chest: clear to auscultation bilaterally, no wheezing, rales or rhonchi Abdomen: soft nontender, nondistended, normal bowel sounds, no organomegaly Extremities: no cyanosis, clubbing or edema noted bilaterally Neuro: Cranial nerves II-XII intact, no focal neurological deficits  Data Reviewed: Basic Metabolic Panel:  Recent Labs Lab 12/28/14 1346 12/29/14 1240 12/30/14 0445  NA 135 139 140  K 3.1* 3.8 3.6  CL 101 113* 108  CO2 _0 GLUCOSE 136* 125* 99  BUN 15 7 <5*  CREATININE 0.84 0.45* 0.52*  CALCIUM 7.8* 7.5* 8.4*  MG  --   --  1.7   Liver Function Tests: No results for input(s): AST, ALT, ALKPHOS, BILITOT, PROT, ALBUMIN in the last 168 hours. No results for input(s): LIPASE, AMYLASE  in the last 168 hours. No results for input(s): AMMONIA in the last 168 hours. CBC:  Recent Labs Lab  12/28/14 1346 12/30/14 0445  WBC 8.3 4.6  NEUTROABS 7.3  --   HGB 9.7* 10.3*  HCT 29.8* 31.7*  MCV 81.2 81.1  PLT 165 214   Cardiac Enzymes: No results for input(s): CKTOTAL, CKMB, CKMBINDEX, TROPONINI in the last 168 hours. BNP (last 3 results) No results for input(s): BNP in the last 8760 hours.  ProBNP (last 3 results) No results for input(s): PROBNP in the last 8760 hours.  CBG: No results for input(s): GLUCAP in the last 168 hours.  Micro Recent Results (from the past 240 hour(s))  MRSA PCR Screening     Status: Abnormal   Collection Time: 12/28/14  4:42 PM  Result Value Ref Range Status   MRSA by PCR POSITIVE (A) NEGATIVE Final    Comment:        The GeneXpert MRSA Assay (FDA approved for NASAL specimens only), is one component of a comprehensive MRSA colonization surveillance program. It is not intended to diagnose MRSA infection nor to guide or monitor treatment for MRSA infections. RESULT CALLED TO, READ BACK BY AND VERIFIED WITH: Zannie Cove [id number] @ [place]   Culture, blood (routine x 2) Call MD if unable to obtain prior to antibiotics being given     Status: None (Preliminary result)   Collection Time: 12/28/14  6:10 PM  Result Value Ref Range Status   Specimen Description BLOOD RIGHT ARM  Final   Special Requests BOTTLES DRAWN AEROBIC AND ANAEROBIC 5 CC  Final   Culture   Final    NO GROWTH 2 DAYS Performed at [facility]    Report Status PENDING  Incomplete  Culture, blood (routine x 2) Call MD if unable to obtain prior to antibiotics being given     Status: None (Preliminary result)   Collection Time: 12/28/14  6:17 PM  Result Value Ref Range Status   Specimen Description BLOOD RIGHT HAND  Final   Special Requests IN PEDIATRIC BOTTLE 3 CC  Final   Culture   Final    NO GROWTH 2 DAYS Performed at [facility]    Report Status PENDING  Incomplete     Studies: Dg Chest 1 View  12/28/2014  CLINICAL DATA:  New  left IJ central venous catheter. Diaphoresis. Weakness and diarrhea. EXAM: CHEST 1 VIEW COMPARISON:  Multiple exams, including 12/28/2014 FINDINGS: Left IJ line tip:  SVC.  No pneumothorax. Mild interstitial accentuation in both lungs. Streaky opacities in the retrocardiac position of the left lower lobe. No pleural effusion.  Heart size within normal limits. IMPRESSION: 1. Streaky opacities in the left lower lobe suspicious for pneumonia. 2. Left IJ line tip:  SVC.  No pneumothorax. 3. Generalized interstitial accentuation in the lungs, mild. Electronically Signed   By: Van Clines M.D.   On: 12/28/2014 22:43   Dg Chest 2 View  12/28/2014  CLINICAL DATA:  Worsening cough, congestion, shortness of breath over the last few days. History of lymphoma. EXAM: CHEST  2 VIEW COMPARISON:  12/19/2014 FINDINGS: Hyperinflation of the lungs. Patchy airspace disease in the left lower lobe at the left base compatible with pneumonia. No confluent opacity on the right. Heart is normal size. No effusions. No acute bony abnormality. Stable mild compression fracture in the upper lumbar spine, likely L1. IMPRESSION: Left lower lobe pneumonia. Hyperinflation. Electronically Signed   By: Rolm Baptise  M.D.   On: 12/28/2014 10:58   Dg Chest Port 1 View  12/30/2014  CLINICAL DATA:  Pneumonia. EXAM: PORTABLE CHEST 1 VIEW COMPARISON:  12/29/2014 and 12/19/2014 FINDINGS: Central line remains in good position. The small patchy infiltrate at the left lung base has almost completely resolved. Diffuse slight interstitial accentuation persists and is more prominent on the left. Vague nodular lesion in the left upper lobe is noted as seen on the prior CT scan 01/10/2014. IMPRESSION: Partial clearing at the left lung base. Persistent and slightly increased interstitial accentuation. Ill-defined lesion in the left lung apex, slightly more apparent than on the prior chest x-ray of 07/31/2014. This was assessed with PET-CT in 2014.  Electronically Signed   By: Lorriane Shire M.D.   On: 12/30/2014 08:35    Scheduled Meds: . Chlorhexidine Gluconate Cloth  6 each Topical Q0600  . enoxaparin (LOVENOX) injection  40 mg Subcutaneous Q24H  . feeding supplement (ENSURE ENLIVE)  237 mL Oral TID BM  . gabapentin  600 mg Oral TID  . guaiFENesin  1,200 mg Oral BID  . levETIRAcetam  250 mg Oral BID  . levofloxacin (LEVAQUIN) IV  750 mg Intravenous Q24H  . meloxicam  15 mg Oral Daily  . mupirocin ointment  1 application Nasal BID  . nicotine  14 mg Transdermal Daily  . saccharomyces boulardii  250 mg Oral BID   Continuous Infusions: . sodium chloride 75 mL/hr at 12/29/14 1800       Time spent: 35 minutes    [facility] A  [facility] Pager [phone] If 7PM-7AM, please contact night-coverage at [email or web address], password [facility] 12/30/2014, 10:06 AM  LOS: 2 days

## 2014-12-30 NOTE — Progress Notes (Signed)
Nicholas King 1230 TRANSFERRED TO 5TH FLOOR. REPORT WAS GIVEN TO ASHLEY RN. NO CHANGE IN PATIENT'S CONDITION. HE WENT TO NEW ROOM WITH NT IN W/CHAIR.

## 2014-12-31 LAB — CBC
HCT: 31.6 % — ABNORMAL LOW (ref 39.0–52.0)
HEMOGLOBIN: 10.3 g/dL — AB (ref 13.0–17.0)
MCH: 26.8 pg (ref 26.0–34.0)
MCHC: 32.6 g/dL (ref 30.0–36.0)
MCV: 82.1 fL (ref 78.0–100.0)
Platelets: 267 10*3/uL (ref 150–400)
RBC: 3.85 MIL/uL — AB (ref 4.22–5.81)
RDW: 15 % (ref 11.5–15.5)
WBC: 4.2 10*3/uL (ref 4.0–10.5)

## 2014-12-31 LAB — BASIC METABOLIC PANEL
ANION GAP: 10 (ref 5–15)
BUN: <5 mg/dL — ABNORMAL LOW (ref 6–20)
CHLORIDE: 106 mmol/L (ref 101–111)
CO2: 25 mmol/L (ref 22–32)
Calcium: 8.3 mg/dL — ABNORMAL LOW (ref 8.9–10.3)
Creatinine, Ser: 0.6 mg/dL — ABNORMAL LOW (ref 0.61–1.24)
GFR calc non Af Amer: >60 mL/min (ref >60)
Glucose, Bld: 95 mg/dL (ref 65–99)
POTASSIUM: 3.6 mmol/L (ref 3.5–5.1)
Sodium: 141 mmol/L (ref 135–145)

## 2014-12-31 MED ORDER — GUAIFENESIN ER 600 MG PO TB12
1200.0000 mg | ORAL_TABLET | Freq: Two times a day (BID) | ORAL | Status: DC
Start: 2014-12-31 — End: 2015-01-20

## 2014-12-31 MED ORDER — LEVOFLOXACIN 750 MG PO TABS: 750.0000 mg | ORAL_TABLET | Freq: Every day | ORAL | Status: DC

## 2014-12-31 MED ORDER — SODIUM CHLORIDE 0.9 % IJ SOLN
10.0000 mL | INTRAMUSCULAR | Status: DC | PRN
  Administered 2014-12-31: 10 mL
  Filled 2014-12-31: qty 40

## 2014-12-31 NOTE — Care Management Note (Signed)
Case Management Note  Patient Details  Name: Nicholas King MRN: [id number] Date of Birth: 01-25-1971  Subjective/Objective:    Admitted with sepsis                Action/Plan: Discharge planning, spoke with patient at bedside regarding issues with obtaining PCP. Patient states he has called several practices and can't find one accepting new patients. Printed a list of primary care offices in [place], also provided contact information for [facility], tried to schedule appt but offices are closed today. Instructed patient to call resources, if no luck he should contact his Medicaid social worker to assist. Patient appreciative of assistance and plans to follow up.   Expected Discharge Date:   (unknown)               Expected Discharge Plan:  Home/Self Care  In-House Referral:  PCP / [facility]  Discharge planning Services  CM Consult  Post Acute Care Choice:  NA Choice offered to:  NA  DME Arranged:  N/A DME Agency:  NA  HH Arranged:  NA HH Agency:  NA  Status of Service:  Completed, signed off  Medicare Important Message Given:    Date Medicare IM Given:    Medicare IM give by:    Date Additional Medicare IM Given:    Additional Medicare Important Message give by:     If discussed at [place] of Stay Meetings, dates discussed:    Additional Comments:  Guadalupe Maple, RN 12/31/2014, 11:46 AM
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
## 2015-01-02 LAB — CULTURE, BLOOD (ROUTINE X 2)
CULTURE: NO GROWTH
Culture: NO GROWTH

## 2015-01-06 DIAGNOSIS — Z79899 Other long term (current) drug therapy: Secondary | ICD-10-CM | POA: Diagnosis not present

## 2015-01-06 DIAGNOSIS — Z79891 Long term (current) use of opiate analgesic: Secondary | ICD-10-CM | POA: Diagnosis not present

## 2015-01-06 DIAGNOSIS — T451X5A Adverse effect of antineoplastic and immunosuppressive drugs, initial encounter: Secondary | ICD-10-CM | POA: Diagnosis not present

## 2015-01-06 DIAGNOSIS — M5416 Radiculopathy, lumbar region: Secondary | ICD-10-CM | POA: Diagnosis not present

## 2015-01-06 DIAGNOSIS — G620 Drug-induced polyneuropathy: Secondary | ICD-10-CM | POA: Diagnosis not present

## 2015-01-06 DIAGNOSIS — Z5181 Encounter for therapeutic drug level monitoring: Secondary | ICD-10-CM | POA: Diagnosis not present

## 2015-01-06 DIAGNOSIS — M542 Cervicalgia: Secondary | ICD-10-CM | POA: Diagnosis not present

## 2015-01-07 ENCOUNTER — Ambulatory Visit (HOSPITAL_COMMUNITY): Payer: Medicare Other

## 2015-01-07 ENCOUNTER — Other Ambulatory Visit: Payer: Medicare Other

## 2015-01-07 ENCOUNTER — Telehealth: Payer: Self-pay | Admitting: Hematology and Oncology

## 2015-01-07 NOTE — Telephone Encounter (Signed)
s.w. pt and r/s lab and MD after ct....pt ok and aware of new d.t

## 2015-01-10 ENCOUNTER — Ambulatory Visit: Payer: Medicare Other | Admitting: Hematology and Oncology

## 2015-01-10 DIAGNOSIS — S129XXD Fracture of neck, unspecified, subsequent encounter: Secondary | ICD-10-CM | POA: Diagnosis not present

## 2015-01-10 DIAGNOSIS — M5416 Radiculopathy, lumbar region: Secondary | ICD-10-CM | POA: Diagnosis not present

## 2015-01-10 DIAGNOSIS — G620 Drug-induced polyneuropathy: Secondary | ICD-10-CM | POA: Diagnosis not present

## 2015-01-11 DIAGNOSIS — M5412 Radiculopathy, cervical region: Secondary | ICD-10-CM | POA: Diagnosis not present

## 2015-01-11 DIAGNOSIS — F17200 Nicotine dependence, unspecified, uncomplicated: Secondary | ICD-10-CM | POA: Diagnosis not present

## 2015-01-11 DIAGNOSIS — R64 Cachexia: Secondary | ICD-10-CM | POA: Diagnosis not present

## 2015-01-11 DIAGNOSIS — C8331 Diffuse large B-cell lymphoma, lymph nodes of head, face, and neck: Secondary | ICD-10-CM | POA: Diagnosis not present

## 2015-01-12 ENCOUNTER — Ambulatory Visit (HOSPITAL_COMMUNITY)
Admission: RE | Admit: 2015-01-12 | Discharge: 2015-01-12 | Disposition: A | Discharge status: Home / Self Care | Payer: Medicare Other | Source: Ambulatory Visit | Attending: Hematology and Oncology | Admitting: Hematology and Oncology

## 2015-01-12 ENCOUNTER — Other Ambulatory Visit: Payer: Medicare Other

## 2015-01-12 ENCOUNTER — Telehealth: Payer: Self-pay | Admitting: *Deleted

## 2015-01-12 DIAGNOSIS — Z8572 Personal history of non-Hodgkin lymphomas: Secondary | ICD-10-CM

## 2015-01-12 DIAGNOSIS — C8330 Diffuse large B-cell lymphoma, unspecified site: Secondary | ICD-10-CM

## 2015-01-12 NOTE — Telephone Encounter (Signed)
Pt did not show up for his CT scan today and he is scheduled to see Dr. Alvy Bimler on 12/15.   I called pt and a little girl answered the phone who said she didn't know anyone named [name], but she gave the phone to someone named Altha Harm.  I informed Altha Harm pt did not show for CT today and she told the girl to give the phone to her "uncle."   The girl then tried to give the phone to pt but he refused to take it.  I could hear the little girl trying to get him to take the phone and she told him a few times it was his "doctor's office" calling.  I then heard the lady, Altha Harm, tell the girl to tell us to "call back later."    I thanked the little girl for trying.

## 2015-01-13 ENCOUNTER — Telehealth: Payer: Self-pay | Admitting: *Deleted

## 2015-01-13 NOTE — Telephone Encounter (Signed)
Pls try again today or tomorrow if possible
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
## 2015-01-19 ENCOUNTER — Encounter (HOSPITAL_COMMUNITY): Payer: Self-pay

## 2015-01-19 ENCOUNTER — Ambulatory Visit (HOSPITAL_COMMUNITY)
Admission: RE | Admit: 2015-01-19 | Discharge: 2015-01-19 | Disposition: A | Discharge status: Home / Self Care | Payer: Medicare Other | Source: Ambulatory Visit | Attending: Hematology and Oncology | Admitting: Hematology and Oncology

## 2015-01-19 DIAGNOSIS — Z9221 Personal history of antineoplastic chemotherapy: Secondary | ICD-10-CM | POA: Diagnosis not present

## 2015-01-19 DIAGNOSIS — E041 Nontoxic single thyroid nodule: Secondary | ICD-10-CM | POA: Diagnosis not present

## 2015-01-19 DIAGNOSIS — R590 Localized enlarged lymph nodes: Secondary | ICD-10-CM | POA: Diagnosis not present

## 2015-01-19 DIAGNOSIS — C8330 Diffuse large B-cell lymphoma, unspecified site: Secondary | ICD-10-CM | POA: Diagnosis not present

## 2015-01-19 DIAGNOSIS — Z8701 Personal history of pneumonia (recurrent): Secondary | ICD-10-CM | POA: Diagnosis not present

## 2015-01-19 DIAGNOSIS — R918 Other nonspecific abnormal finding of lung field: Secondary | ICD-10-CM | POA: Insufficient documentation

## 2015-01-19 DIAGNOSIS — R634 Abnormal weight loss: Secondary | ICD-10-CM | POA: Diagnosis not present

## 2015-01-19 DIAGNOSIS — R161 Splenomegaly, not elsewhere classified: Secondary | ICD-10-CM | POA: Insufficient documentation

## 2015-01-19 DIAGNOSIS — J984 Other disorders of lung: Secondary | ICD-10-CM | POA: Insufficient documentation

## 2015-01-19 MED ORDER — IOHEXOL 300 MG/ML  SOLN
100.0000 mL | Freq: Once | INTRAMUSCULAR | Status: AC | PRN
  Administered 2015-01-19: 80 mL via INTRAVENOUS

## 2015-01-20 ENCOUNTER — Encounter: Payer: Self-pay | Admitting: Hematology and Oncology

## 2015-01-20 ENCOUNTER — Telehealth: Payer: Self-pay | Admitting: Hematology and Oncology

## 2015-01-20 ENCOUNTER — Ambulatory Visit: Payer: Medicare Other | Admitting: Hematology and Oncology

## 2015-01-20 ENCOUNTER — Ambulatory Visit (HOSPITAL_BASED_OUTPATIENT_CLINIC_OR_DEPARTMENT_OTHER): Payer: Medicare Other | Admitting: Hematology and Oncology

## 2015-01-20 VITALS — BP 130/76 | HR 92 | Temp 98.2°F | Resp 18 | Ht 67.0 in | Wt 105.8 lb

## 2015-01-20 DIAGNOSIS — D638 Anemia in other chronic diseases classified elsewhere: Secondary | ICD-10-CM | POA: Insufficient documentation

## 2015-01-20 DIAGNOSIS — Z720 Tobacco use: Secondary | ICD-10-CM

## 2015-01-20 DIAGNOSIS — Z8572 Personal history of non-Hodgkin lymphomas: Secondary | ICD-10-CM | POA: Diagnosis not present

## 2015-01-20 DIAGNOSIS — J189 Pneumonia, unspecified organism: Secondary | ICD-10-CM

## 2015-01-20 DIAGNOSIS — F17210 Nicotine dependence, cigarettes, uncomplicated: Secondary | ICD-10-CM

## 2015-01-20 NOTE — Assessment & Plan Note (Signed)
I spent some time counseling the patient the importance of tobacco cessation. he is currently attempting to quit on his own 
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
## 2015-01-20 NOTE — Progress Notes (Signed)
[place] OFFICE PROGRESS NOTE  Patient Care Team: Rosita Fire, MD as PCP - General (Internal Medicine) Melissa Montane, MD as Attending Physician (Otolaryngology) Michel Bickers, MD (Infectious Diseases) Leota Sauers, RN as Registered Nurse (Oncology) Heath Lark, MD as Consulting Physician (Hematology and Oncology) Glenna Fellows, MD as Consulting Physician (Neurosurgery)  SUMMARY OF ONCOLOGIC HISTORY:   History of B-cell lymphoma   05/16/2012 Initial Diagnosis History of B-cell lymphoma   01/19/2015 Imaging CT scan showed no evidence of disease. Mild splenomegaly is noted. 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   SUMMARY OF ONCOLOGIC HISTORY: #08 March 2012 He developed left side sore throat; progressive; 2 months. Hoarse voice. Dysphagia with solid (better with liquid). 40 lb weight loss 3 weeks. #2 CT soft tissue the neck on 04/17/2012 showed extensive soft tissue swelling of the left tonsil without abscess. There were shotty lymph nodes in the neck bilaterally.  #3 Biopsy by Dr. Janace Hoard on 05/08/2012 showed large B-cell lymphoma in the posterior pharynx, and in the right posterior pharyngeal wall.  #4 He was started on chemo R-[facility] on 06/09/2012 #5 10/03/2012: He completed 6 cycles of chemo along with 4 rounds of intrathecal chemotherapy #6 10/24/2012: Restaging PET scan showed complete response #7 10/30/12: Patient was started on consolidation RT #8 01/10/2014, he had accidental fall with compression fracture. CT scan of the chest, abdomen and pelvis show no evidence of recurrence of lymphoma  INTERVAL HISTORY: Please see below for problem oriented charting. The patient was recently hospitalized for pneumonia. Today he is 2 hours late. He denies further cough, fevers or chills. He continues to smoke 2 to 3 cigarettes per day. He continues to battle  with chronic neck pain and back pain. REVIEW OF SYSTEMS:   Constitutional: Denies fevers, chills or abnormal weight loss Eyes: Denies blurriness of vision Ears, nose, mouth, throat, and face: Denies mucositis or sore throat Respiratory: Denies cough, dyspnea or wheezes Cardiovascular: Denies palpitation, chest discomfort or lower extremity swelling Gastrointestinal:  Denies nausea, heartburn or change in bowel habits Skin: Denies abnormal skin rashes Lymphatics: Denies new lymphadenopathy or easy bruising Neurological:Denies numbness, tingling or new weaknesses Behavioral/Psych: Mood is stable, no new changes  All other systems were reviewed with the patient and are negative.  I have reviewed the past medical history, past surgical history, social history and family history with the patient and they are unchanged from previous note.  ALLERGIES:  is allergic to vancomycin; cymbalta; penicillins; amoxicillin; and hydrocodone.  MEDICATIONS:  Current Outpatient Prescriptions  Medication Sig Dispense Refill  . feeding supplement, ENSURE ENLIVE, (ENSURE ENLIVE) LIQD Take 237 mLs by mouth 3 (three) times daily between meals. 237 mL 12  . gabapentin (NEURONTIN) 300 MG capsule Take 600 mg by mouth 3 (three) times daily.    Marland Kitchen levETIRAcetam (KEPPRA) 250 MG tablet Take 250 mg by mouth 2 (two) times daily.    . meloxicam (MOBIC) 15 MG tablet Take 15 mg by mouth daily.    Marland Kitchen oxyCODONE 10 MG TABS Take 1 tablet (10 mg total) by mouth 3 (three) times daily as needed for severe pain. 42 tablet 0   No current facility-administered medications for this visit.    PHYSICAL EXAMINATION: ECOG PERFORMANCE STATUS: 0 - Asymptomatic  Filed Vitals:   01/20/15 1418  BP: 130/76  Pulse: 92  Temp: 98.2 F (36.8 C)  Resp:  18   Filed Weights   01/20/15 1418  Weight: 105 lb 12.8 oz (47.991 kg)    GENERAL:alert, no distress and comfortable. He looks thin and cachectic SKIN: skin color, texture, turgor are  normal, no rashes or significant lesions EYES: normal, Conjunctiva are pink and non-injected, sclera clear OROPHARYNX:no exudate, no erythema and lips, buccal mucosa, and tongue normal . Poor dentition is noted NECK: supple, thyroid normal size, non-tender, without nodularity LYMPH:  no palpable lymphadenopathy in the cervical, axillary or inguinal LUNGS: clear to auscultation and percussion with normal breathing effort HEART: regular rate & rhythm and no murmurs and no lower extremity edema ABDOMEN:abdomen soft, non-tender and normal bowel sounds Musculoskeletal:no cyanosis of digits and no clubbing  NEURO: alert & oriented x 3 with fluent speech, no focal motor/sensory deficits  LABORATORY DATA:  I have reviewed the data as listed    Component Value Date/Time   NA 141 12/31/2014 0600   NA 139 07/20/2014 1413   K 3.6 12/31/2014 0600   K 4.0 07/20/2014 1413   CL 106 12/31/2014 0600   CL 107 07/24/2012 0819   CO2 25 12/31/2014 0600   CO2 25 07/20/2014 1413   GLUCOSE 95 12/31/2014 0600   GLUCOSE 104 07/20/2014 1413   GLUCOSE 83 07/24/2012 0819   BUN <5* 12/31/2014 0600   BUN 12.4 07/20/2014 1413   CREATININE 0.60* 12/31/2014 0600   CREATININE 0.8 07/20/2014 1413   CALCIUM 8.3* 12/31/2014 0600   CALCIUM 8.5 07/20/2014 1413   PROT 5.9* 07/31/2014 1615   PROT 6.2* 07/20/2014 1413   ALBUMIN 3.3* 07/31/2014 1615   ALBUMIN 3.7 07/20/2014 1413   AST 19 07/31/2014 1615   AST 18 07/20/2014 1413   ALT 14* 07/31/2014 1615   ALT 12 07/20/2014 1413   ALKPHOS 43 07/31/2014 1615   ALKPHOS 54 07/20/2014 1413   BILITOT 0.7 07/31/2014 1615   BILITOT 0.82 07/20/2014 1413   GFRNONAA >60 12/31/2014 0600   GFRAA >60 12/31/2014 0600    No results found for: SPEP, UPEP  Lab Results  Component Value Date   WBC 4.2 12/31/2014   NEUTROABS 7.3 12/28/2014   HGB 10.3* 12/31/2014   HCT 31.6* 12/31/2014   MCV 82.1 12/31/2014   PLT 267 12/31/2014      Chemistry      Component Value  Date/Time   NA 141 12/31/2014 0600   NA 139 07/20/2014 1413   K 3.6 12/31/2014 0600   K 4.0 07/20/2014 1413   CL 106 12/31/2014 0600   CL 107 07/24/2012 0819   CO2 25 12/31/2014 0600   CO2 25 07/20/2014 1413   BUN <5* 12/31/2014 0600   BUN 12.4 07/20/2014 1413   CREATININE 0.60* 12/31/2014 0600   CREATININE 0.8 07/20/2014 1413      Component Value Date/Time   CALCIUM 8.3* 12/31/2014 0600   CALCIUM 8.5 07/20/2014 1413   ALKPHOS 43 07/31/2014 1615   ALKPHOS 54 07/20/2014 1413   AST 19 07/31/2014 1615   AST 18 07/20/2014 1413   ALT 14* 07/31/2014 1615   ALT 12 07/20/2014 1413   BILITOT 0.7 07/31/2014 1615   BILITOT 0.82 07/20/2014 1413       RADIOGRAPHIC STUDIES: I have personally reviewed the radiological images as listed and agreed with the findings in the report. Ct Chest W Contrast  01/19/2015  CLINICAL DATA:  44 year old male with history of large B-cell lymphoma diagnosed in 2014. Recent history of pneumonia in November 2016. Weight loss. Last chemotherapy in  2014. EXAM: CT CHEST, ABDOMEN, AND PELVIS WITH CONTRAST TECHNIQUE: Multidetector CT imaging of the chest, abdomen and pelvis was performed following the standard protocol during bolus administration of intravenous contrast. CONTRAST:  51mL OMNIPAQUE IOHEXOL 300 MG/ML  SOLN COMPARISON:  None. FINDINGS: CT CHEST FINDINGS Mediastinum/Nodes: Heart size is normal. Trace amount of pericardial fluid and/or thickening anteriorly, unlikely to be of any hemodynamic significance at this time. No associated pericardial calcification. Multiple borderline enlarged mediastinal and bilateral hilar lymph nodes. Mildly enlarged right hilar lymph node measuring 10 mm in short axis is unchanged. No axillary lymphadenopathy. Esophagus is normal in appearance. Interval enlargement of a 20 x 13 mm nodule in the right lobe of the thyroid gland which appears to have some internal septations or enhancement (image 7 of series 2), previously only 11 x 9  mm on prior study 01/10/2014. Lungs/Pleura: Again noted are areas of architectural distortion in the left upper lobe, corresponding to the area of acute cavitary infection noted on prior CT scans back in 2014. There is continued evolution in the appearance of this area, with 2 ground-glass attenuation areas on today's examination measuring 13 x 13 mm (image 16 of series 4) and 9 x 9 mm (image 18 of series 4) with some associated bronchial wall thickening, mild cylindrical bronchiectasis and architectural distortion. A few other tiny pulmonary nodules are noted and similar to the prior examination, measuring up to 4 mm in the anterior left lower lobe abutting the left major fissure, presumably benign. New compared to the prior study there are areas of extensive peribronchovascular micronodularity in the basal segments of the left lower lobe, and to a lesser extent in the right middle lobe, which likely reflect areas of mucoid impaction. Given the recent appearance on chest radiographs, this likely reflects a resolving multilobar bronchopneumonia. No pleural effusions. Musculoskeletal: There are no aggressive appearing lytic or blastic lesions noted in the visualized portions of the skeleton. CT ABDOMEN PELVIS FINDINGS Hepatobiliary: No cystic or solid hepatic lesions. No intra or extrahepatic biliary ductal dilatation. Gallbladder is normal in appearance. Pancreas: No pancreatic mass. No pancreatic ductal dilatation. No pancreatic or peripancreatic fluid or inflammatory changes. Spleen: Spleen is mildly enlarged measuring 9.2 x 5.4 x 15.4 cm (estimated splenic volume of 383 mL). Adrenals/Urinary Tract: Bilateral kidneys and bilateral adrenal glands are normal in appearance. No hydroureteronephrosis. Urinary bladder is normal in appearance. Stomach/Bowel: Appearance of the stomach is normal. No pathologic dilatation of small bowel or colon. Normal appendix. Vascular/Lymphatic: No significant atherosclerotic disease,  aneurysm or dissection identified in the abdominal or pelvic vasculature. No lymphadenopathy noted in the abdomen or pelvis. Reproductive: Prostate gland and seminal vesicles are unremarkable in appearance. Other: No significant volume of ascites.  No pneumoperitoneum. Musculoskeletal: Old L1 vertebral body compression fracture with approximately 30% loss of anterior vertebral body height is unchanged. There are no aggressive appearing lytic or blastic lesions noted in the visualized portions of the skeleton. IMPRESSION: 1. The only findings on today's examination that is concerning for potential lymphoma recurrence is a presence of very mild splenomegaly. There is also a mildly enlarged right hilar lymph node, however, this is unchanged in size compared to prior study 01/10/2014. 2. Enlarging nodule in the right lobe of thyroid gland. This is nonspecific, but further evaluation with thyroid ultrasound is suggested in the near future to exclude malignancy. 3. Findings in the lungs suggests resolving multilobar bronchopneumonia in the basal segments of the left lower lobe and in the right middle lobe. 4.  Continued evolution of post infectious scarring in the left upper lobe. 5. Additional incidental findings, as above. Electronically Signed   By: Vinnie Langton M.D.   On: 01/19/2015 16:41   Ct Abdomen Pelvis W Contrast  01/19/2015  CLINICAL DATA:  44 year old male with history of large B-cell lymphoma diagnosed in 2014. Recent history of pneumonia in November 2016. Weight loss. Last chemotherapy in 2014. EXAM: CT CHEST, ABDOMEN, AND PELVIS WITH CONTRAST TECHNIQUE: Multidetector CT imaging of the chest, abdomen and pelvis was performed following the standard protocol during bolus administration of intravenous contrast. CONTRAST:  3mL OMNIPAQUE IOHEXOL 300 MG/ML  SOLN COMPARISON:  None. FINDINGS: CT CHEST FINDINGS Mediastinum/Nodes: Heart size is normal. Trace amount of pericardial fluid and/or thickening  anteriorly, unlikely to be of any hemodynamic significance at this time. No associated pericardial calcification. Multiple borderline enlarged mediastinal and bilateral hilar lymph nodes. Mildly enlarged right hilar lymph node measuring 10 mm in short axis is unchanged. No axillary lymphadenopathy. Esophagus is normal in appearance. Interval enlargement of a 20 x 13 mm nodule in the right lobe of the thyroid gland which appears to have some internal septations or enhancement (image 7 of series 2), previously only 11 x 9 mm on prior study 01/10/2014. Lungs/Pleura: Again noted are areas of architectural distortion in the left upper lobe, corresponding to the area of acute cavitary infection noted on prior CT scans back in 2014. There is continued evolution in the appearance of this area, with 2 ground-glass attenuation areas on today's examination measuring 13 x 13 mm (image 16 of series 4) and 9 x 9 mm (image 18 of series 4) with some associated bronchial wall thickening, mild cylindrical bronchiectasis and architectural distortion. A few other tiny pulmonary nodules are noted and similar to the prior examination, measuring up to 4 mm in the anterior left lower lobe abutting the left major fissure, presumably benign. New compared to the prior study there are areas of extensive peribronchovascular micronodularity in the basal segments of the left lower lobe, and to a lesser extent in the right middle lobe, which likely reflect areas of mucoid impaction. Given the recent appearance on chest radiographs, this likely reflects a resolving multilobar bronchopneumonia. No pleural effusions. Musculoskeletal: There are no aggressive appearing lytic or blastic lesions noted in the visualized portions of the skeleton. CT ABDOMEN PELVIS FINDINGS Hepatobiliary: No cystic or solid hepatic lesions. No intra or extrahepatic biliary ductal dilatation. Gallbladder is normal in appearance. Pancreas: No pancreatic mass. No pancreatic  ductal dilatation. No pancreatic or peripancreatic fluid or inflammatory changes. Spleen: Spleen is mildly enlarged measuring 9.2 x 5.4 x 15.4 cm (estimated splenic volume of 383 mL). Adrenals/Urinary Tract: Bilateral kidneys and bilateral adrenal glands are normal in appearance. No hydroureteronephrosis. Urinary bladder is normal in appearance. Stomach/Bowel: Appearance of the stomach is normal. No pathologic dilatation of small bowel or colon. Normal appendix. Vascular/Lymphatic: No significant atherosclerotic disease, aneurysm or dissection identified in the abdominal or pelvic vasculature. No lymphadenopathy noted in the abdomen or pelvis. Reproductive: Prostate gland and seminal vesicles are unremarkable in appearance. Other: No significant volume of ascites.  No pneumoperitoneum. Musculoskeletal: Old L1 vertebral body compression fracture with approximately 30% loss of anterior vertebral body height is unchanged. There are no aggressive appearing lytic or blastic lesions noted in the visualized portions of the skeleton. IMPRESSION: 1. The only findings on today's examination that is concerning for potential lymphoma recurrence is a presence of very mild splenomegaly. There is also a mildly enlarged right  hilar lymph node, however, this is unchanged in size compared to prior study 01/10/2014. 2. Enlarging nodule in the right lobe of thyroid gland. This is nonspecific, but further evaluation with thyroid ultrasound is suggested in the near future to exclude malignancy. 3. Findings in the lungs suggests resolving multilobar bronchopneumonia in the basal segments of the left lower lobe and in the right middle lobe. 4. Continued evolution of post infectious scarring in the left upper lobe. 5. Additional incidental findings, as above. Electronically Signed   By: Vinnie Langton M.D.   On: 01/19/2015 16:41     ASSESSMENT & PLAN:  History of B-cell lymphoma His most recent CT showed positive response to  treatment. I will see him back with history, physical examination and blood work  In 9 months without imaging study     CAP (community acquired pneumonia) He is recovering from recent pneumonia.  continue observation and I recommend smoking cessation  Anemia in chronic illness This is likely anemia of chronic disease. The patient denies recent history of bleeding such as epistaxis, hematuria or hematochezia. He is asymptomatic from the anemia. We will observe for now.  He does not require transfusion now.    Cigarette smoker I spent some time counseling the patient the importance of tobacco cessation. he is currently attempting to quit on his own   No orders of the defined types were placed in this encounter.   All questions were answered. The patient knows to call the clinic with any problems, questions or concerns. No barriers to learning was detected. I spent 20 minutes counseling the patient face to face. The total time spent in the appointment was 25 minutes and more than 50% was on counseling and review of test results     [facility], [place], MD 01/20/2015 2:31 PM

## 2015-01-20 NOTE — Telephone Encounter (Signed)
per pof to sch pt appt-gave pt copy of avs °

## 2015-01-20 NOTE — Assessment & Plan Note (Signed)
He is recovering from recent pneumonia.  continue observation and I recommend smoking cessation

## 2015-01-20 NOTE — Assessment & Plan Note (Signed)
This is likely anemia of chronic disease. The patient denies recent history of bleeding such as epistaxis, hematuria or hematochezia. He is asymptomatic from the anemia. We will observe for now.  He does not require transfusion now.   

## 2015-02-08 LAB — LEGIONELLA PNEUMOPHILA SEROGP 1 UR AG: L. PNEUMOPHILA SEROGP 1 UR AG: NEGATIVE

## 2015-02-09 DIAGNOSIS — M542 Cervicalgia: Secondary | ICD-10-CM | POA: Diagnosis not present

## 2015-02-09 DIAGNOSIS — G620 Drug-induced polyneuropathy: Secondary | ICD-10-CM | POA: Diagnosis not present

## 2015-02-09 DIAGNOSIS — M5416 Radiculopathy, lumbar region: Secondary | ICD-10-CM | POA: Diagnosis not present

## 2015-02-09 DIAGNOSIS — S129XXD Fracture of neck, unspecified, subsequent encounter: Secondary | ICD-10-CM | POA: Diagnosis not present

## 2015-02-25 DIAGNOSIS — M47812 Spondylosis without myelopathy or radiculopathy, cervical region: Secondary | ICD-10-CM | POA: Diagnosis not present

## 2015-04-12 DIAGNOSIS — C8510 Unspecified B-cell lymphoma, unspecified site: Secondary | ICD-10-CM | POA: Diagnosis not present

## 2015-04-12 DIAGNOSIS — M545 Low back pain: Secondary | ICD-10-CM | POA: Diagnosis not present

## 2015-04-12 DIAGNOSIS — F17200 Nicotine dependence, unspecified, uncomplicated: Secondary | ICD-10-CM | POA: Diagnosis not present

## 2015-05-03 DIAGNOSIS — M5416 Radiculopathy, lumbar region: Secondary | ICD-10-CM | POA: Diagnosis not present

## 2015-05-03 DIAGNOSIS — G620 Drug-induced polyneuropathy: Secondary | ICD-10-CM | POA: Diagnosis not present

## 2015-05-03 DIAGNOSIS — R030 Elevated blood-pressure reading, without diagnosis of hypertension: Secondary | ICD-10-CM | POA: Diagnosis not present

## 2015-05-03 DIAGNOSIS — M542 Cervicalgia: Secondary | ICD-10-CM | POA: Diagnosis not present

## 2015-05-05 DIAGNOSIS — M542 Cervicalgia: Secondary | ICD-10-CM | POA: Diagnosis not present

## 2015-05-05 DIAGNOSIS — S129XXD Fracture of neck, unspecified, subsequent encounter: Secondary | ICD-10-CM | POA: Diagnosis not present

## 2015-05-26 DIAGNOSIS — M545 Low back pain: Secondary | ICD-10-CM | POA: Diagnosis not present

## 2015-05-26 DIAGNOSIS — M542 Cervicalgia: Secondary | ICD-10-CM | POA: Diagnosis not present

## 2015-06-20 DIAGNOSIS — M545 Low back pain: Secondary | ICD-10-CM | POA: Diagnosis not present

## 2015-06-20 DIAGNOSIS — M542 Cervicalgia: Secondary | ICD-10-CM | POA: Diagnosis not present

## 2015-07-27 ENCOUNTER — Telehealth: Payer: Self-pay | Admitting: Hematology and Oncology

## 2015-07-27 NOTE — Telephone Encounter (Signed)
S.w. Pt and moved 9.1 to 9.7.Marland Kitchenthe patient ok and aware

## 2015-08-22 DIAGNOSIS — S129XXD Fracture of neck, unspecified, subsequent encounter: Secondary | ICD-10-CM | POA: Diagnosis not present

## 2015-08-22 DIAGNOSIS — M5416 Radiculopathy, lumbar region: Secondary | ICD-10-CM | POA: Diagnosis not present

## 2015-08-22 DIAGNOSIS — M542 Cervicalgia: Secondary | ICD-10-CM | POA: Diagnosis not present

## 2015-08-22 DIAGNOSIS — M545 Low back pain: Secondary | ICD-10-CM | POA: Diagnosis not present

## 2015-08-22 DIAGNOSIS — G620 Drug-induced polyneuropathy: Secondary | ICD-10-CM | POA: Diagnosis not present

## 2015-09-20 DIAGNOSIS — M542 Cervicalgia: Secondary | ICD-10-CM | POA: Diagnosis not present

## 2015-10-07 ENCOUNTER — Ambulatory Visit: Payer: Medicare Other | Admitting: Hematology and Oncology

## 2015-10-07 ENCOUNTER — Other Ambulatory Visit: Payer: Medicare Other

## 2015-10-13 ENCOUNTER — Ambulatory Visit (HOSPITAL_BASED_OUTPATIENT_CLINIC_OR_DEPARTMENT_OTHER): Payer: Medicare Other | Admitting: Hematology and Oncology

## 2015-10-13 ENCOUNTER — Telehealth: Payer: Self-pay | Admitting: Hematology and Oncology

## 2015-10-13 ENCOUNTER — Other Ambulatory Visit (HOSPITAL_BASED_OUTPATIENT_CLINIC_OR_DEPARTMENT_OTHER): Payer: Medicare Other

## 2015-10-13 VITALS — BP 113/83 | HR 88 | Temp 99.5°F | Resp 18 | Wt 102.4 lb

## 2015-10-13 DIAGNOSIS — G8929 Other chronic pain: Secondary | ICD-10-CM | POA: Insufficient documentation

## 2015-10-13 DIAGNOSIS — D638 Anemia in other chronic diseases classified elsewhere: Secondary | ICD-10-CM

## 2015-10-13 DIAGNOSIS — M545 Low back pain: Secondary | ICD-10-CM | POA: Diagnosis not present

## 2015-10-13 DIAGNOSIS — E041 Nontoxic single thyroid nodule: Secondary | ICD-10-CM | POA: Diagnosis not present

## 2015-10-13 DIAGNOSIS — Z8572 Personal history of non-Hodgkin lymphomas: Secondary | ICD-10-CM

## 2015-10-13 DIAGNOSIS — Z720 Tobacco use: Secondary | ICD-10-CM | POA: Diagnosis not present

## 2015-10-13 DIAGNOSIS — M549 Dorsalgia, unspecified: Secondary | ICD-10-CM

## 2015-10-13 DIAGNOSIS — F17210 Nicotine dependence, cigarettes, uncomplicated: Secondary | ICD-10-CM

## 2015-10-13 DIAGNOSIS — C8330 Diffuse large B-cell lymphoma, unspecified site: Secondary | ICD-10-CM

## 2015-10-13 LAB — CBC WITH DIFFERENTIAL/PLATELET
BASO%: 0.2 % (ref 0.0–2.0)
BASOS ABS: 0 10*3/uL (ref 0.0–0.1)
EOS ABS: 0 10*3/uL (ref 0.0–0.5)
EOS%: 0.2 % (ref 0.0–7.0)
HEMATOCRIT: 35 % — AB (ref 38.4–49.9)
HEMOGLOBIN: 11 g/dL — AB (ref 13.0–17.1)
LYMPH#: 0.8 10*3/uL — AB (ref 0.9–3.3)
LYMPH%: 19 % (ref 14.0–49.0)
MCH: 25.3 pg — AB (ref 27.2–33.4)
MCHC: 31.4 g/dL — AB (ref 32.0–36.0)
MCV: 80.6 fL (ref 79.3–98.0)
MONO#: 0.4 10*3/uL (ref 0.1–0.9)
MONO%: 9.3 % (ref 0.0–14.0)
NEUT#: 3.2 10*3/uL (ref 1.5–6.5)
NEUT%: 71.3 % (ref 39.0–75.0)
Platelets: 173 10*3/uL (ref 140–400)
RBC: 4.34 10*6/uL (ref 4.20–5.82)
RDW: 18.2 % — AB (ref 11.0–14.6)
WBC: 4.4 10*3/uL (ref 4.0–10.3)

## 2015-10-13 LAB — COMPREHENSIVE METABOLIC PANEL
ALBUMIN: 2.9 g/dL — AB (ref 3.5–5.0)
ALT: 31 U/L (ref 0–55)
ANION GAP: 8 meq/L (ref 3–11)
AST: 45 U/L — ABNORMAL HIGH (ref 5–34)
Alkaline Phosphatase: 85 U/L (ref 40–150)
BUN: 8.4 mg/dL (ref 7.0–26.0)
CALCIUM: 8.6 mg/dL (ref 8.4–10.4)
CHLORIDE: 101 meq/L (ref 98–109)
CO2: 28 meq/L (ref 22–29)
CREATININE: 0.7 mg/dL (ref 0.7–1.3)
GLUCOSE: 91 mg/dL (ref 70–140)
Potassium: 4 mEq/L (ref 3.5–5.1)
Sodium: 137 mEq/L (ref 136–145)
TOTAL PROTEIN: 6.8 g/dL (ref 6.4–8.3)
Total Bilirubin: 0.96 mg/dL (ref 0.20–1.20)

## 2015-10-13 LAB — LACTATE DEHYDROGENASE: LDH: 185 U/L (ref 125–245)

## 2015-10-13 NOTE — Assessment & Plan Note (Signed)
He has stable thyroid nodule. I will check TSH due to exposure to radiation

## 2015-10-13 NOTE — Assessment & Plan Note (Signed)
I spent some time counseling the patient the importance of tobacco cessation. he is currently attempting to quit on his own 

## 2015-10-13 NOTE — Telephone Encounter (Signed)
Gave patient avs report and appointments for September 2018.  °

## 2015-10-13 NOTE — Assessment & Plan Note (Signed)
His most recent CT in December 2016 showed no definitive signs of cancer recurrence. I will see him back with history, physical examination and blood work in 1 year without imaging study

## 2015-10-13 NOTE — Assessment & Plan Note (Signed)
The patient have chronic back pain due to compression fractures, unrelated to his lymphoma. He is taking chronic pain medicine for this. I recommend vitamin D supplements.

## 2015-10-13 NOTE — Assessment & Plan Note (Signed)
This is likely anemia of chronic disease. The patient denies recent history of bleeding such as epistaxis, hematuria or hematochezia. He is asymptomatic from the anemia. We will observe for now.  

## 2015-10-13 NOTE — Progress Notes (Signed)
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   SUMMARY OF ONCOLOGIC HISTORY: #08 March 2012 He developed left side sore throat; progressive; 2 months. Hoarse voice. Dysphagia with solid (better with liquid). 40 lb weight loss 3 weeks. #2 CT soft tissue the neck on 04/17/2012 showed extensive soft tissue swelling of the left tonsil without abscess. There were shotty lymph nodes in the neck bilaterally.  #3 Biopsy by Dr. Janace Hoard on 05/08/2012 showed large B-cell lymphoma in the posterior pharynx, and in the right posterior pharyngeal wall.  #4 He was started on chemo R-CHOP on 06/09/2012 #5 10/03/2012: He completed 6 cycles of chemo along with 4 rounds of intrathecal chemotherapy #6 10/24/2012: Restaging PET scan showed complete response #7 10/30/12: Patient was started on consolidation RT #8 01/10/2014, he had accidental fall with compression fracture. CT scan of the chest, abdomen and pelvis show no evidence of recurrence of lymphoma #9 01/14/2015, repeat CT scan showed no signs of cancer recurrence  INTERVAL HISTORY: Please see below for problem oriented charting. He denies recent infection. No new lymphadenopathy. Denied dysphagia. He continues chronic back pain, currently on chronic narcotic prescription by his pain management clinic. The patient continues to smoke  REVIEW OF SYSTEMS:   Constitutional: Denies fevers, chills or abnormal weight loss Eyes: Denies blurriness of vision Ears, nose, mouth, throat, and face:  Denies mucositis or sore throat Respiratory: Denies cough, dyspnea or wheezes Cardiovascular: Denies palpitation, chest discomfort or lower extremity swelling Gastrointestinal:  Denies nausea, heartburn or change in bowel habits Skin: Denies abnormal skin rashes Lymphatics: Denies new lymphadenopathy or easy bruising Neurological:Denies numbness, tingling or new weaknesses Behavioral/Psych: Mood is stable, no new changes  All other systems were reviewed with the patient and are negative.  I have reviewed the past medical history, past surgical history, social history and family history with the patient and they are unchanged from previous note.  ALLERGIES:  is allergic to vancomycin; cymbalta [duloxetine hcl]; penicillins; amoxicillin; and hydrocodone.  MEDICATIONS:  Current Outpatient Prescriptions  Medication Sig Dispense Refill  . feeding supplement, ENSURE ENLIVE, (ENSURE ENLIVE) LIQD Take 237 mLs by mouth 3 (three) times daily between meals. 237 mL 12  . gabapentin (NEURONTIN) 300 MG capsule Take 600 mg by mouth 3 (three) times daily.    Marland Kitchen levETIRAcetam (KEPPRA) 250 MG tablet Take 250 mg by mouth 2 (two) times daily.    . meloxicam (MOBIC) 15 MG tablet Take 15 mg by mouth daily.    Marland Kitchen oxyCODONE (ROXICODONE) 15 MG immediate release tablet Take 15 mg by mouth 3 (three) times daily as needed.     No current facility-administered medications for this visit.     PHYSICAL EXAMINATION: ECOG PERFORMANCE STATUS: 0 - Asymptomatic  Vitals:   10/13/15 1509  BP: 113/83  Pulse: 88  Resp: 18  Temp: 99.5 F (37.5 C)   Filed Weights   10/13/15 1509  Weight: 102 lb 6.4 oz (46.4 kg)    GENERAL:alert, no distress and comfortable. He looks thin and cachectic SKIN: skin color, texture, turgor are normal, no rashes or significant lesions  EYES: normal, Conjunctiva are pink and non-injected, sclera clear OROPHARYNX:no exudate, no erythema and lips, buccal mucosa, and tongue normal  NECK:  supple, thyroid normal size, non-tender, without nodularity LYMPH:  no palpable lymphadenopathy in the cervical, axillary or inguinal LUNGS: clear to auscultation and percussion with normal breathing effort HEART: regular rate & rhythm and no murmurs and no lower extremity edema ABDOMEN:abdomen soft, non-tender and normal bowel sounds Musculoskeletal:no cyanosis of digits and no clubbing  NEURO: alert & oriented x 3 with fluent speech, no focal motor/sensory deficits  LABORATORY DATA:  I have reviewed the data as listed    Component Value Date/Time   NA 141 12/31/2014 0600   NA 139 07/20/2014 1413   K 3.6 12/31/2014 0600   K 4.0 07/20/2014 1413   CL 106 12/31/2014 0600   CL 107 07/24/2012 0819   CO2 25 12/31/2014 0600   CO2 25 07/20/2014 1413   GLUCOSE 95 12/31/2014 0600   GLUCOSE 104 07/20/2014 1413   GLUCOSE 83 07/24/2012 0819   BUN <5 (L) 12/31/2014 0600   BUN 12.4 07/20/2014 1413   CREATININE 0.60 (L) 12/31/2014 0600   CREATININE 0.8 07/20/2014 1413   CALCIUM 8.3 (L) 12/31/2014 0600   CALCIUM 8.5 07/20/2014 1413   PROT 5.9 (L) 07/31/2014 1615   PROT 6.2 (L) 07/20/2014 1413   ALBUMIN 3.3 (L) 07/31/2014 1615   ALBUMIN 3.7 07/20/2014 1413   AST 19 07/31/2014 1615   AST 18 07/20/2014 1413   ALT 14 (L) 07/31/2014 1615   ALT 12 07/20/2014 1413   ALKPHOS 43 07/31/2014 1615   ALKPHOS 54 07/20/2014 1413   BILITOT 0.7 07/31/2014 1615   BILITOT 0.82 07/20/2014 1413   GFRNONAA >60 12/31/2014 0600   GFRAA >60 12/31/2014 0600    No results found for: SPEP, UPEP  Lab Results  Component Value Date   WBC 4.4 10/13/2015   NEUTROABS 3.2 10/13/2015   HGB 11.0 (L) 10/13/2015   HCT 35.0 (L) 10/13/2015   MCV 80.6 10/13/2015   PLT 173 10/13/2015      Chemistry      Component Value Date/Time   NA 141 12/31/2014 0600   NA 139 07/20/2014 1413   K 3.6 12/31/2014 0600   K 4.0 07/20/2014 1413   CL 106 12/31/2014 0600   CL 107 07/24/2012 0819   CO2 25 12/31/2014 0600   CO2 25  07/20/2014 1413   BUN <5 (L) 12/31/2014 0600   BUN 12.4 07/20/2014 1413   CREATININE 0.60 (L) 12/31/2014 0600   CREATININE 0.8 07/20/2014 1413      Component Value Date/Time   CALCIUM 8.3 (L) 12/31/2014 0600   CALCIUM 8.5 07/20/2014 1413   ALKPHOS 43 07/31/2014 1615   ALKPHOS 54 07/20/2014 1413   AST 19 07/31/2014 1615   AST 18 07/20/2014 1413   ALT 14 (L) 07/31/2014 1615   ALT 12 07/20/2014 1413   BILITOT 0.7 07/31/2014 1615   BILITOT 0.82 07/20/2014 1413      ASSESSMENT & PLAN:  History of B-cell lymphoma His most recent CT in December 2016 showed no definitive signs of cancer recurrence. I will see him back with history, physical examination and blood work in 1 year without imaging study   Anemia in chronic illness This is likely anemia of chronic disease. The patient denies recent history of bleeding such as epistaxis, hematuria or hematochezia. He is asymptomatic from the anemia. We will observe for now.   Chronic midline back pain The patient have  chronic back pain due to compression fractures, unrelated to his lymphoma. He is taking chronic pain medicine for this. I recommend vitamin D supplements.  Cigarette smoker I spent some time counseling the patient the importance of tobacco cessation. he is currently attempting to quit on his own  Thyroid nodule He has stable thyroid nodule. I will check TSH due to exposure to radiation   Orders Placed This Encounter  Procedures  . TSH    Standing Status:   Future    Number of Occurrences:   1    Standing Expiration Date:   11/16/2016  . CBC with Differential/Platelet    Standing Status:   Future    Standing Expiration Date:   11/16/2016  . Comprehensive metabolic panel    Standing Status:   Future    Standing Expiration Date:   11/16/2016  . TSH    Standing Status:   Future    Standing Expiration Date:   11/16/2016   All questions were answered. The patient knows to call the clinic with any problems,  questions or concerns. No barriers to learning was detected. I spent 15 minutes counseling the patient face to face. The total time spent in the appointment was 20 minutes and more than 50% was on counseling and review of test results     [facility], [place], MD 10/13/2015 3:25 PM

## 2015-10-14 LAB — TSH: TSH: 2.828 m[IU]/L (ref 0.320–4.118)

## 2015-10-18 DIAGNOSIS — M545 Low back pain: Secondary | ICD-10-CM | POA: Diagnosis not present

## 2015-10-18 DIAGNOSIS — M5416 Radiculopathy, lumbar region: Secondary | ICD-10-CM | POA: Diagnosis not present

## 2015-10-18 DIAGNOSIS — M542 Cervicalgia: Secondary | ICD-10-CM | POA: Diagnosis not present

## 2015-10-18 DIAGNOSIS — G620 Drug-induced polyneuropathy: Secondary | ICD-10-CM | POA: Diagnosis not present

## 2015-12-19 DIAGNOSIS — M545 Low back pain: Secondary | ICD-10-CM | POA: Diagnosis not present

## 2016-01-09 DIAGNOSIS — R030 Elevated blood-pressure reading, without diagnosis of hypertension: Secondary | ICD-10-CM | POA: Diagnosis not present

## 2016-01-09 DIAGNOSIS — Z681 Body mass index (BMI) 19 or less, adult: Secondary | ICD-10-CM | POA: Diagnosis not present

## 2016-01-09 DIAGNOSIS — M5416 Radiculopathy, lumbar region: Secondary | ICD-10-CM | POA: Diagnosis not present

## 2016-01-09 DIAGNOSIS — M542 Cervicalgia: Secondary | ICD-10-CM | POA: Diagnosis not present

## 2016-01-09 DIAGNOSIS — M545 Low back pain: Secondary | ICD-10-CM | POA: Diagnosis not present

## 2016-02-10 ENCOUNTER — Other Ambulatory Visit: Payer: Self-pay | Admitting: Nurse Practitioner

## 2016-04-09 DIAGNOSIS — Z681 Body mass index (BMI) 19 or less, adult: Secondary | ICD-10-CM | POA: Diagnosis not present

## 2016-04-09 DIAGNOSIS — G620 Drug-induced polyneuropathy: Secondary | ICD-10-CM | POA: Diagnosis not present

## 2016-04-09 DIAGNOSIS — M545 Low back pain: Secondary | ICD-10-CM | POA: Diagnosis not present

## 2016-04-09 DIAGNOSIS — M5416 Radiculopathy, lumbar region: Secondary | ICD-10-CM | POA: Diagnosis not present

## 2016-04-09 DIAGNOSIS — M542 Cervicalgia: Secondary | ICD-10-CM | POA: Diagnosis not present

## 2016-04-19 DIAGNOSIS — M545 Low back pain: Secondary | ICD-10-CM | POA: Diagnosis not present

## 2016-04-19 DIAGNOSIS — M5416 Radiculopathy, lumbar region: Secondary | ICD-10-CM | POA: Diagnosis not present

## 2016-05-22 DIAGNOSIS — M5416 Radiculopathy, lumbar region: Secondary | ICD-10-CM | POA: Diagnosis not present

## 2016-05-22 DIAGNOSIS — G620 Drug-induced polyneuropathy: Secondary | ICD-10-CM | POA: Diagnosis not present

## 2016-05-22 DIAGNOSIS — Z681 Body mass index (BMI) 19 or less, adult: Secondary | ICD-10-CM | POA: Diagnosis not present

## 2016-05-22 DIAGNOSIS — M542 Cervicalgia: Secondary | ICD-10-CM | POA: Diagnosis not present

## 2016-07-23 DIAGNOSIS — G620 Drug-induced polyneuropathy: Secondary | ICD-10-CM | POA: Diagnosis not present

## 2016-07-23 DIAGNOSIS — M542 Cervicalgia: Secondary | ICD-10-CM | POA: Diagnosis not present

## 2016-07-28 ENCOUNTER — Emergency Department (HOSPITAL_COMMUNITY)
Admission: EM | Admit: 2016-07-28 | Discharge: 2016-07-28 | Disposition: A | Discharge status: Home / Self Care | Payer: Medicare Other | Attending: Emergency Medicine | Admitting: Emergency Medicine

## 2016-07-28 ENCOUNTER — Emergency Department (HOSPITAL_COMMUNITY): Payer: Medicare Other

## 2016-07-28 ENCOUNTER — Encounter (HOSPITAL_COMMUNITY): Payer: Self-pay | Admitting: Emergency Medicine

## 2016-07-28 DIAGNOSIS — R0981 Nasal congestion: Secondary | ICD-10-CM | POA: Diagnosis not present

## 2016-07-28 DIAGNOSIS — L02413 Cutaneous abscess of right upper limb: Secondary | ICD-10-CM | POA: Diagnosis not present

## 2016-07-28 DIAGNOSIS — F17210 Nicotine dependence, cigarettes, uncomplicated: Secondary | ICD-10-CM | POA: Diagnosis not present

## 2016-07-28 DIAGNOSIS — R05 Cough: Secondary | ICD-10-CM | POA: Insufficient documentation

## 2016-07-28 DIAGNOSIS — L0291 Cutaneous abscess, unspecified: Secondary | ICD-10-CM

## 2016-07-28 DIAGNOSIS — R059 Cough, unspecified: Secondary | ICD-10-CM

## 2016-07-28 MED ORDER — SULFAMETHOXAZOLE-TRIMETHOPRIM 800-160 MG PO TABS
1.0000 | ORAL_TABLET | Freq: Once | ORAL | Status: AC
  Administered 2016-07-28: 1 via ORAL
  Filled 2016-07-28: qty 1

## 2016-07-28 MED ORDER — LIDOCAINE-EPINEPHRINE-TETRACAINE (LET) SOLUTION
3.0000 mL | Freq: Once | NASAL | Status: AC
  Administered 2016-07-28: 3 mL via TOPICAL
  Filled 2016-07-28: qty 3

## 2016-07-28 MED ORDER — SULFAMETHOXAZOLE-TRIMETHOPRIM 800-160 MG PO TABS: 1.0000 | ORAL_TABLET | Freq: Two times a day (BID) | ORAL | 0 refills | Status: AC

## 2016-07-28 MED ORDER — OXYCODONE-ACETAMINOPHEN 5-325 MG PO TABS
1.0000 | ORAL_TABLET | Freq: Once | ORAL | Status: AC
  Administered 2016-07-28: 1 via ORAL
  Filled 2016-07-28: qty 1

## 2016-07-28 MED ORDER — LIDOCAINE HCL (PF) 1 % IJ SOLN
5.0000 mL | Freq: Once | INTRAMUSCULAR | Status: AC
  Administered 2016-07-28: 5 mL via INTRADERMAL
  Filled 2016-07-28: qty 5

## 2016-07-28 NOTE — ED Provider Notes (Signed)
[facility] DEPT Provider Note   CSN: [id number] Arrival date & time: 07/28/16  0745     History   Chief Complaint Chief Complaint  Patient presents with  . Abscess    HPI Nicholas King is a 46 y.o. male.  HPI  Nicholas King is a 46 y.o. male who presents to the Emergency Department complaining of painful, red, swollen area to the right forearm for 5 days.  States he noticed a small "pimple" at onset that has increased in size.  He states his pain management provider suggested that he go to ER to have it evaluated.  He complains of pain with palpation and movement.  He also reports a productive cough for several days.  He denies shortness of breath, chest pain, fever, chills, abdominal pain.  Denies IV drug use.    Past Medical History:  Diagnosis Date  . Bone pain 12/26/2012  . Cancer ([place])   . Chronic back pain   . Chronic neck pain    Hx C7 fx after fall 01/2014  . Compression fracture 01/2014   L1  . Diffuse large B-cell lymphoma ([place])   . Dysphagia, unspecified(787.20) 05/16/2012  . GERD (gastroesophageal reflux disease)   . Lumbago 01/06/2013  . Lymphoma ([place])   . Neuropathic pain 11/06/2012  . Neuropathy 12/26/2012  . Pain management   . Pinched nerve in neck   . Pneumonia    April 2014  . 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 Radiation 10/30/12-11/25/12   Bilateral Neck/Tonsil  . Radiation 10/30/12-11/25/12   Bilateral neck and tonsils    Patient Active Problem List   Diagnosis Date Noted  . Chronic midline back pain 10/13/2015  . Anemia in chronic illness 01/20/2015  . Underweight 12/30/2014  . CAP (community acquired pneumonia) 12/28/2014  . Encounter for central line placement   . Malnutrition of moderate degree ([place]) 08/03/2014  . Fever chills 07/31/2014  . Sepsis ([place]) 07/31/2014  . Leukopenia 07/20/2014  . Thrombocytopenia ([place]) 07/20/2014  . Tobacco abuse 07/20/2014  . Compression fracture 01/19/2014  . Dysphagia 07/20/2013  .  Poor dentition 07/20/2013  . Meralgia paraesthetica 01/06/2013  . Lumbago 01/06/2013  . Neuropathy ([place]) 12/26/2012  . Bone pain 12/26/2012  . Neuropathic pain 11/06/2012  . Cachexia ([place]) 05/16/2012  . History of B-cell lymphoma 05/16/2012  . Dysphagia, unspecified(787.20) 05/16/2012  . GERD (gastroesophageal reflux disease) 05/16/2012  . Thyroid nodule 05/16/2012  . Cigarette smoker 05/16/2012    Past Surgical History:  Procedure Laterality Date  . DIRECT LARYNGOSCOPY Left 05/08/2012   Procedure: DIRECT LARYNGOSCOPY with biopsy left tonsil;  Surgeon: Melissa Montane, MD;  Location: [facility];  Service: ENT;  Laterality: Left;  . NO PAST SURGERIES    . right eyebrown bone     Right frontal skull fracture       Home Medications    Prior to Admission medications   Medication Sig Start Date End Date Taking? Authorizing Provider  feeding supplement, ENSURE ENLIVE, (ENSURE ENLIVE) LIQD Take 237 mLs by mouth 3 (three) times daily between meals. 08/05/14   Mikhail, Velta Addison, DO  gabapentin (NEURONTIN) 300 MG capsule Take 600 mg by mouth 3 (three) times daily.    [provider]  levETIRAcetam (KEPPRA) 250 MG tablet Take 250 mg by mouth 2 (two) times daily. 11/29/14   [provider]  meloxicam (MOBIC) 15 MG tablet Take 15 mg by mouth daily. 07/15/14   [provider]  oxyCODONE (ROXICODONE) 15 MG immediate release  tablet Take 15 mg by mouth 3 (three) times daily as needed. 09/27/15   [provider]    Family History Family History  Problem Relation Age of Onset  . Cancer Father        lung  . Diabetes Father   . Cancer Maternal Aunt        gyn cancer, pancreas  . Cancer Paternal Aunt        pancreas cancer  . Cancer Paternal Uncle        unk  . Cancer Sister     Social History Social History  Substance Use Topics  . Smoking status: Current Every Day Smoker    Packs/day: 0.50    Years: 25.00    Types: Cigarettes  . Smokeless tobacco: Never Used   . Alcohol use No     Allergies   Vancomycin; Cymbalta [duloxetine hcl]; Penicillins; Amoxicillin; and Hydrocodone   Review of Systems Review of Systems  Constitutional: Negative for chills and fever.  HENT: Positive for congestion.   Respiratory: Positive for cough. Negative for chest tightness, shortness of breath, wheezing and stridor.   Cardiovascular: Negative for chest pain.  Gastrointestinal: Negative for abdominal pain, nausea and vomiting.  Musculoskeletal: Negative for arthralgias and joint swelling.  Skin: Positive for color change.       Abscess right forearm  Hematological: Negative for adenopathy.  All other systems reviewed and are negative.    Physical Exam Updated Vital Signs BP 97/71   Pulse 83   Temp 98.4 F (36.9 C) (Oral)   Resp 14   Ht 5\' 6"  (1.676 m)   Wt 43.1 kg (95 lb)   SpO2 100%   BMI 15.33 kg/m   Physical Exam  Constitutional: He is oriented to person, place, and time. He appears well-developed and well-nourished. No distress.  HENT:  Head: Normocephalic and atraumatic.  Cardiovascular: Normal rate, regular rhythm, normal heart sounds and intact distal pulses.   No murmur heard. Pulmonary/Chest: Effort normal and breath sounds normal. No respiratory distress. He has no wheezes. He has no rales.  Musculoskeletal: Normal range of motion. He exhibits no tenderness.  Pt has FROM of the wrist and fingers of the right hand.  Neurological: He is alert and oriented to person, place, and time. He exhibits normal muscle tone. Coordination normal.  Skin: Skin is warm and dry. Capillary refill takes less than 2 seconds. There is erythema.  Fluctuant mass to the mid right forearm with mild surrounding erythema. No drainage.    Psychiatric: He has a normal mood and affect.  Nursing note and vitals reviewed.    ED Treatments / Results  Labs (all labs ordered are listed, but only abnormal results are displayed) Labs Reviewed - No data to  display  EKG  EKG Interpretation None       Radiology Dg Chest 2 View  Result Date: 07/28/2016 CLINICAL DATA:  Cough and generalized weakness a few days with chills. EXAM: CHEST  2 VIEW COMPARISON:  12/30/2014 FINDINGS: Lungs are hyperexpanded without consolidation or effusion. Cardiomediastinal silhouette and remainder of the exam is unchanged. IMPRESSION: No active cardiopulmonary disease. Electronically Signed   By: Marin Olp M.D.   On: 07/28/2016 09:37    Procedures Procedures (including critical care time)  INCISION AND DRAINAGE Performed by: Kem Parkinson L. Consent: Verbal consent obtained. Risks and benefits: risks, benefits and alternatives were discussed Type: abscess  Body area: right forearm  Anesthesia: topical and local infiltration  Spontaneous drainage after removal  of LET.    Local anesthetic: LET and lidocaine 1 % w/o epinephrine  Anesthetic total: 3 ml and 2 ml respectively  Complexity: simple Blunt dissection to break up loculations, saline irrigation until free from purulent drainage.   Drainage: purulent  Drainage amount: large  Packing material: none  Patient tolerance: Patient tolerated the procedure well with no immediate complications.     Medications Ordered in ED Medications  oxyCODONE-acetaminophen (PERCOCET/ROXICET) 5-325 MG per tablet 1 tablet (not administered)  lidocaine-EPINEPHrine-tetracaine (LET) solution (3 mLs Topical Given 07/28/16 0840)  lidocaine (PF) (XYLOCAINE) 1 % injection 5 mL (5 mLs Intradermal Given by Other 07/28/16 0840)  sulfamethoxazole-trimethoprim (BACTRIM DS,SEPTRA DS) 800-160 MG per tablet 1 tablet (1 tablet Oral Given 07/28/16 0839)     Initial Impression / Assessment and Plan / ED Course  I have reviewed the triage vital signs and the nursing notes.  Pertinent labs & imaging results that were available during my care of the patient were reviewed by me and considered in my medical decision making  (see chart for details).    Patient well appearing. Vital signs are stable. Lungs are clear to auscultation bilaterally. No hypoxia or tachypnea.  Pain improved after drainage of abscess to the forearm. He remains neurovascularly intact. Patient agrees to warm compresses. I will write a prescription for antibiotics. Return precautions discussed.  Final Clinical Impressions(s) / ED Diagnoses   Final diagnoses:  Abscess  Cough    New Prescriptions New Prescriptions   No medications on file     Kem Parkinson, Hershal Coria 07/28/16 1053    Fredia Sorrow, MD 08/03/16 [phone]

## 2016-07-28 NOTE — ED Notes (Signed)
EDP at bedside  

## 2016-07-28 NOTE — ED Notes (Signed)
Pt attempting to e-sign for d/c, but error message occurs. Pt verbalizes d/c instructions, prescription, and return conditions. Pt ambulatory to lobby.

## 2016-07-28 NOTE — ED Notes (Signed)
Pt returned from xray

## 2016-07-28 NOTE — ED Notes (Signed)
Pt given coke to drink per request.

## 2016-07-28 NOTE — ED Notes (Signed)
Dressed wound with non-adherent dressing and covered with medipore. Educated patient on how and when to change dressings. Pt provided with some supplies for home care. Pt verbalized understanding.

## 2016-07-28 NOTE — ED Notes (Signed)
Pt transported to xray 

## 2016-07-28 NOTE — Discharge Instructions (Signed)
Warm water soaks or compresses 3 times a day until the area heals.  Keep it bandaged.  Follow-up with your doctor for recheck.  Return here for any worsening symptoms

## 2016-07-28 NOTE — ED Triage Notes (Addendum)
PT c/o red/swollen painful area to right forearm with no drainage or fevers x5 days. PT also c/o productive thick sputum cough x3 days with generalized weakness.

## 2016-08-23 DIAGNOSIS — L089 Local infection of the skin and subcutaneous tissue, unspecified: Secondary | ICD-10-CM | POA: Diagnosis not present

## 2016-08-29 DIAGNOSIS — G620 Drug-induced polyneuropathy: Secondary | ICD-10-CM | POA: Diagnosis not present

## 2016-08-29 DIAGNOSIS — Z681 Body mass index (BMI) 19 or less, adult: Secondary | ICD-10-CM | POA: Diagnosis not present

## 2016-08-29 DIAGNOSIS — M542 Cervicalgia: Secondary | ICD-10-CM | POA: Diagnosis not present

## 2016-08-29 DIAGNOSIS — M5416 Radiculopathy, lumbar region: Secondary | ICD-10-CM | POA: Diagnosis not present

## 2016-10-12 ENCOUNTER — Telehealth: Payer: Self-pay | Admitting: *Deleted

## 2016-10-12 ENCOUNTER — Encounter: Payer: Self-pay | Admitting: Hematology and Oncology

## 2016-10-12 ENCOUNTER — Ambulatory Visit (HOSPITAL_BASED_OUTPATIENT_CLINIC_OR_DEPARTMENT_OTHER): Payer: Medicare Other | Admitting: Hematology and Oncology

## 2016-10-12 ENCOUNTER — Telehealth: Payer: Self-pay | Admitting: Hematology and Oncology

## 2016-10-12 ENCOUNTER — Other Ambulatory Visit (HOSPITAL_BASED_OUTPATIENT_CLINIC_OR_DEPARTMENT_OTHER): Payer: Medicare Other

## 2016-10-12 DIAGNOSIS — F17210 Nicotine dependence, cigarettes, uncomplicated: Secondary | ICD-10-CM | POA: Diagnosis not present

## 2016-10-12 DIAGNOSIS — Z8572 Personal history of non-Hodgkin lymphomas: Secondary | ICD-10-CM | POA: Diagnosis not present

## 2016-10-12 DIAGNOSIS — E039 Hypothyroidism, unspecified: Secondary | ICD-10-CM

## 2016-10-12 DIAGNOSIS — D509 Iron deficiency anemia, unspecified: Secondary | ICD-10-CM | POA: Diagnosis not present

## 2016-10-12 DIAGNOSIS — E041 Nontoxic single thyroid nodule: Secondary | ICD-10-CM | POA: Diagnosis not present

## 2016-10-12 DIAGNOSIS — W57XXXA Bitten or stung by nonvenomous insect and other nonvenomous arthropods, initial encounter: Secondary | ICD-10-CM | POA: Insufficient documentation

## 2016-10-12 LAB — CBC WITH DIFFERENTIAL/PLATELET
BASO%: 0.3 % (ref 0.0–2.0)
Basophils Absolute: 0 10*3/uL (ref 0.0–0.1)
EOS%: 0.1 % (ref 0.0–7.0)
Eosinophils Absolute: 0 10*3/uL (ref 0.0–0.5)
HCT: 29.2 % — ABNORMAL LOW (ref 38.4–49.9)
HGB: 9.6 g/dL — ABNORMAL LOW (ref 13.0–17.1)
LYMPH%: 29.1 % (ref 14.0–49.0)
MCH: 23.9 pg — ABNORMAL LOW (ref 27.2–33.4)
MCHC: 33 g/dL (ref 32.0–36.0)
MCV: 72.4 fL — ABNORMAL LOW (ref 79.3–98.0)
MONO#: 0.4 10*3/uL (ref 0.1–0.9)
MONO%: 9.5 % (ref 0.0–14.0)
NEUT#: 2.8 10*3/uL (ref 1.5–6.5)
NEUT%: 61 % (ref 39.0–75.0)
Platelets: 240 10*3/uL (ref 140–400)
RBC: 4.03 10*6/uL — ABNORMAL LOW (ref 4.20–5.82)
RDW: 20.1 % — ABNORMAL HIGH (ref 11.0–14.6)
WBC: 4.6 10*3/uL (ref 4.0–10.3)
lymph#: 1.3 10*3/uL (ref 0.9–3.3)

## 2016-10-12 LAB — COMPREHENSIVE METABOLIC PANEL
ALBUMIN: 2.7 g/dL — AB (ref 3.5–5.0)
ALT: 16 U/L (ref 0–55)
ANION GAP: 9 meq/L (ref 3–11)
AST: 31 U/L (ref 5–34)
Alkaline Phosphatase: 97 U/L (ref 40–150)
BUN: 8 mg/dL (ref 7.0–26.0)
CALCIUM: 8.2 mg/dL — AB (ref 8.4–10.4)
CHLORIDE: 95 meq/L — AB (ref 98–109)
CO2: 25 mEq/L (ref 22–29)
CREATININE: 0.9 mg/dL (ref 0.7–1.3)
EGFR: >90 mL/min/{1.73_m2} (ref >90)
Glucose: 118 mg/dl (ref 70–140)
POTASSIUM: 4.3 meq/L (ref 3.5–5.1)
Sodium: 128 mEq/L — ABNORMAL LOW (ref 136–145)
Total Bilirubin: 1.11 mg/dL (ref 0.20–1.20)
Total Protein: 6.3 g/dL — ABNORMAL LOW (ref 6.4–8.3)

## 2016-10-12 LAB — TSH: TSH: 5.994 m[IU]/L — AB (ref 0.320–4.118)

## 2016-10-12 MED ORDER — LEVOTHYROXINE SODIUM 25 MCG PO TABS: 25.0000 ug | ORAL_TABLET | Freq: Every day | ORAL | 2 refills | Status: DC

## 2016-10-12 NOTE — Assessment & Plan Note (Signed)
I spent some time counseling the patient the importance of tobacco cessation. he is currently attempting to quit on his own 

## 2016-10-12 NOTE — Assessment & Plan Note (Signed)
The patient is covered with cutaneous skin infection/abscesses from bug bites He has been treated with multiple courses of antibiotics in the past and has very poor living situation I recommend consideration to move out of his living situation right now

## 2016-10-12 NOTE — Progress Notes (Signed)
[place] OFFICE PROGRESS NOTE  Patient Care Team: Rosita Fire, MD as PCP - General (Internal Medicine) Melissa Montane, MD as Attending Physician (Otolaryngology) Michel Bickers, MD (Infectious Diseases) Leota Sauers, RN as Registered Nurse (Oncology) Heath Lark, MD as Consulting Physician (Hematology and Oncology) Glenna Fellows, MD as Consulting Physician (Neurosurgery)  SUMMARY OF ONCOLOGIC HISTORY:   History of B-cell lymphoma   05/16/2012 Initial Diagnosis    History of B-cell lymphoma      01/19/2015 Imaging    CT scan showed no evidence of disease. Mild splenomegaly is noted. Resolving pneumonia is noted       INTERVAL HISTORY: Please see below for problem oriented charting. The patient returns for further follow-up He has no new lymphadenopathy He has started to gain some weight since the last time I saw him The patient had recurrent skin infection from but bites The patient denies any recent signs or symptoms of bleeding such as spontaneous epistaxis, hematuria or hematochezia.   REVIEW OF SYSTEMS:   Constitutional: Denies fevers, chills or abnormal weight loss Eyes: Denies blurriness of vision Ears, nose, mouth, throat, and face: Denies mucositis or sore throat Respiratory: Denies cough, dyspnea or wheezes Cardiovascular: Denies palpitation, chest discomfort or lower extremity swelling Gastrointestinal:  Denies nausea, heartburn or change in bowel habits Lymphatics: Denies new lymphadenopathy or easy bruising Neurological:Denies numbness, tingling or new weaknesses Behavioral/Psych: Mood is stable, no new changes  All other systems were reviewed with the patient and are negative.  I have reviewed the past medical history, past surgical history, social history and family history with the patient and they are unchanged from previous note.  ALLERGIES:  is allergic to vancomycin; cymbalta [duloxetine hcl]; penicillins; amoxicillin; and  hydrocodone.  MEDICATIONS:  Current Outpatient Prescriptions  Medication Sig Dispense Refill  . feeding supplement, ENSURE ENLIVE, (ENSURE ENLIVE) LIQD Take 237 mLs by mouth 3 (three) times daily between meals. 237 mL 12  . gabapentin (NEURONTIN) 300 MG capsule Take 600 mg by mouth 3 (three) times daily.    Marland Kitchen levETIRAcetam (KEPPRA) 250 MG tablet Take 250 mg by mouth 2 (two) times daily.    Marland Kitchen levothyroxine (SYNTHROID, LEVOTHROID) 25 MCG tablet Take 1 tablet (25 mcg total) by mouth daily before breakfast. 30 tablet 2  . meloxicam (MOBIC) 15 MG tablet Take 15 mg by mouth daily.    Marland Kitchen oxyCODONE (ROXICODONE) 15 MG immediate release tablet Take 15 mg by mouth 3 (three) times daily as needed.     No current facility-administered medications for this visit.     PHYSICAL EXAMINATION: ECOG PERFORMANCE STATUS: 1 - Symptomatic but completely ambulatory  Vitals:   10/12/16 1326  BP: (!) 89/53  Pulse: 86  Resp: 18  Temp: 99 F (37.2 C)  SpO2: 100%   Filed Weights   10/12/16 1326  Weight: 107 lb (48.5 kg)    GENERAL:alert, no distress and comfortable.  Poor personal hygiene is noted SKIN: Multiple skin  infection with signs of bug bites noted. EYES: normal, Conjunctiva are pink and non-injected, sclera clear OROPHARYNX:no exudate, no erythema and lips, buccal mucosa, and tongue normal  NECK: supple, thyroid normal size, non-tender, without nodularity LYMPH:  no palpable lymphadenopathy in the cervical, axillary or inguinal LUNGS: clear to auscultation and percussion with normal breathing effort HEART: regular rate & rhythm and no murmurs and no lower extremity edema ABDOMEN:abdomen soft, non-tender and normal bowel sounds Musculoskeletal:no cyanosis of digits and no clubbing  NEURO: alert & oriented x 3  with fluent speech, no focal motor/sensory deficits  LABORATORY DATA:  I have reviewed the data as listed    Component Value Date/Time   NA 128 (L) 10/12/2016 1303   K 4.3 10/12/2016  1303   CL 106 12/31/2014 0600   CL 107 07/24/2012 0819   CO2 25 10/12/2016 1303   GLUCOSE 118 10/12/2016 1303   GLUCOSE 83 07/24/2012 0819   BUN 8.0 10/12/2016 1303   CREATININE 0.9 10/12/2016 1303   CALCIUM 8.2 (L) 10/12/2016 1303   PROT 6.3 (L) 10/12/2016 1303   ALBUMIN 2.7 (L) 10/12/2016 1303   AST 31 10/12/2016 1303   ALT 16 10/12/2016 1303   ALKPHOS 97 10/12/2016 1303   BILITOT 1.11 10/12/2016 1303   GFRNONAA >60 12/31/2014 0600   GFRAA >60 12/31/2014 0600    No results found for: SPEP, UPEP  Lab Results  Component Value Date   WBC 4.6 10/12/2016   NEUTROABS 2.8 10/12/2016   HGB 9.6 (L) 10/12/2016   HCT 29.2 (L) 10/12/2016   MCV 72.4 (L) 10/12/2016   PLT 240 10/12/2016      Chemistry      Component Value Date/Time   NA 128 (L) 10/12/2016 1303   K 4.3 10/12/2016 1303   CL 106 12/31/2014 0600   CL 107 07/24/2012 0819   CO2 25 10/12/2016 1303   BUN 8.0 10/12/2016 1303   CREATININE 0.9 10/12/2016 1303      Component Value Date/Time   CALCIUM 8.2 (L) 10/12/2016 1303   ALKPHOS 97 10/12/2016 1303   AST 31 10/12/2016 1303   ALT 16 10/12/2016 1303   BILITOT 1.11 10/12/2016 1303       ASSESSMENT & PLAN:  History of B-cell lymphoma Clinically, he have no signs of cancer recurrence The cause of anemia is unrelated I will see him back again in a year with history, physical examination and blood work  Cigarette smoker I spent some time counseling the patient the importance of tobacco cessation. he is currently attempting to quit on his own  Acquired hypothyroidism He has acquired hypothyroidism likely due to radiation exposure I will start him on gentle dose of levothyroxine He is recommended to get TSH monitoring through his primary care doctor in the future  Iron deficiency anemia He has evidence of iron deficiency anemia The patient is covered by bug bites and he also take chronic NSAID He denies GI bleed I recommend discontinuation of NSAID The  patient is recommended to eat iron rich diet and to get his blood count monitored through his primary care doctor in the near future If the iron deficiency anemia does not improve, we will give him iron replacement therapy  Bug bites The patient is covered with cutaneous skin infection/abscesses from bug bites He has been treated with multiple courses of antibiotics in the past and has very poor living situation I recommend consideration to move out of his living situation right now   No orders of the defined types were placed in this encounter.  All questions were answered. The patient knows to call the clinic with any problems, questions or concerns. No barriers to learning was detected. I spent 15 minutes counseling the patient face to face. The total time spent in the appointment was 20 minutes and more than 50% was on counseling and review of test results     Heath Lark, MD 10/12/2016 3:06 PM

## 2016-10-12 NOTE — Telephone Encounter (Signed)
-----   Message from Heath Lark, MD sent at 10/12/2016  2:34 PM EDT ----- Regarding: abnormal thyroid level Pls let HIM KNOW thyroid test is not normal Please call in levothyroxine 25 mcg PO daily, 30 pills, 2 refills He needs PCP to monitor thyroid level. If not, let us know ----- Message ----- From: Interface, Lab In Three Zero One Sent: 10/12/2016   1:14 PM To: Heath Lark, MD

## 2016-10-12 NOTE — Assessment & Plan Note (Signed)
He has acquired hypothyroidism likely due to radiation exposure I will start him on gentle dose of levothyroxine He is recommended to get TSH monitoring through his primary care doctor in the future

## 2016-10-12 NOTE — Assessment & Plan Note (Signed)
Clinically, he have no signs of cancer recurrence The cause of anemia is unrelated I will see him back again in a year with history, physical examination and blood work

## 2016-10-12 NOTE — Telephone Encounter (Signed)
Gave patient AVS and calendar of September 2019 appointment.  °

## 2016-10-12 NOTE — Telephone Encounter (Signed)
Wife notified of message below. Verbalized understanding

## 2016-10-12 NOTE — Assessment & Plan Note (Signed)
He has evidence of iron deficiency anemia The patient is covered by bug bites and he also take chronic NSAID He denies GI bleed I recommend discontinuation of NSAID The patient is recommended to eat iron rich diet and to get his blood count monitored through his primary care doctor in the near future If the iron deficiency anemia does not improve, we will give him iron replacement therapy

## 2016-10-14 ENCOUNTER — Encounter (HOSPITAL_COMMUNITY): Payer: Self-pay | Admitting: *Deleted

## 2016-10-14 ENCOUNTER — Inpatient Hospital Stay (HOSPITAL_COMMUNITY)
Admission: EM | Admit: 2016-10-14 | Discharge: 2016-10-15 | DRG: 602 | Disposition: A | Discharge status: Home / Self Care | Payer: Medicare Other | Attending: Internal Medicine | Admitting: Internal Medicine

## 2016-10-14 DIAGNOSIS — E871 Hypo-osmolality and hyponatremia: Secondary | ICD-10-CM | POA: Diagnosis present

## 2016-10-14 DIAGNOSIS — Z885 Allergy status to narcotic agent status: Secondary | ICD-10-CM

## 2016-10-14 DIAGNOSIS — G589 Mononeuropathy, unspecified: Secondary | ICD-10-CM | POA: Diagnosis present

## 2016-10-14 DIAGNOSIS — K219 Gastro-esophageal reflux disease without esophagitis: Secondary | ICD-10-CM | POA: Diagnosis present

## 2016-10-14 DIAGNOSIS — E039 Hypothyroidism, unspecified: Secondary | ICD-10-CM | POA: Diagnosis present

## 2016-10-14 DIAGNOSIS — M542 Cervicalgia: Secondary | ICD-10-CM | POA: Diagnosis present

## 2016-10-14 DIAGNOSIS — D509 Iron deficiency anemia, unspecified: Secondary | ICD-10-CM | POA: Diagnosis not present

## 2016-10-14 DIAGNOSIS — G8929 Other chronic pain: Secondary | ICD-10-CM | POA: Diagnosis present

## 2016-10-14 DIAGNOSIS — R1310 Dysphagia, unspecified: Secondary | ICD-10-CM | POA: Diagnosis present

## 2016-10-14 DIAGNOSIS — Z880 Allergy status to penicillin: Secondary | ICD-10-CM | POA: Diagnosis not present

## 2016-10-14 DIAGNOSIS — F17210 Nicotine dependence, cigarettes, uncomplicated: Secondary | ICD-10-CM | POA: Diagnosis present

## 2016-10-14 DIAGNOSIS — Z833 Family history of diabetes mellitus: Secondary | ICD-10-CM | POA: Diagnosis not present

## 2016-10-14 DIAGNOSIS — L97529 Non-pressure chronic ulcer of other part of left foot with unspecified severity: Secondary | ICD-10-CM | POA: Diagnosis present

## 2016-10-14 DIAGNOSIS — R64 Cachexia: Secondary | ICD-10-CM | POA: Diagnosis present

## 2016-10-14 DIAGNOSIS — W57XXXA Bitten or stung by nonvenomous insect and other nonvenomous arthropods, initial encounter: Secondary | ICD-10-CM | POA: Diagnosis present

## 2016-10-14 DIAGNOSIS — L03115 Cellulitis of right lower limb: Secondary | ICD-10-CM | POA: Diagnosis not present

## 2016-10-14 DIAGNOSIS — Z888 Allergy status to other drugs, medicaments and biological substances status: Secondary | ICD-10-CM | POA: Diagnosis not present

## 2016-10-14 DIAGNOSIS — Z681 Body mass index (BMI) 19 or less, adult: Secondary | ICD-10-CM | POA: Diagnosis not present

## 2016-10-14 DIAGNOSIS — L97519 Non-pressure chronic ulcer of other part of right foot with unspecified severity: Secondary | ICD-10-CM | POA: Diagnosis present

## 2016-10-14 DIAGNOSIS — L03119 Cellulitis of unspecified part of limb: Secondary | ICD-10-CM

## 2016-10-14 DIAGNOSIS — Z881 Allergy status to other antibiotic agents status: Secondary | ICD-10-CM | POA: Diagnosis not present

## 2016-10-14 DIAGNOSIS — C8330 Diffuse large B-cell lymphoma, unspecified site: Secondary | ICD-10-CM | POA: Diagnosis present

## 2016-10-14 DIAGNOSIS — L0390 Cellulitis, unspecified: Secondary | ICD-10-CM | POA: Diagnosis present

## 2016-10-14 DIAGNOSIS — M545 Low back pain: Secondary | ICD-10-CM | POA: Diagnosis present

## 2016-10-14 DIAGNOSIS — Z800 Family history of malignant neoplasm of digestive organs: Secondary | ICD-10-CM

## 2016-10-14 DIAGNOSIS — L03116 Cellulitis of left lower limb: Secondary | ICD-10-CM | POA: Diagnosis present

## 2016-10-14 DIAGNOSIS — Z801 Family history of malignant neoplasm of trachea, bronchus and lung: Secondary | ICD-10-CM

## 2016-10-14 DIAGNOSIS — E43 Unspecified severe protein-calorie malnutrition: Secondary | ICD-10-CM | POA: Diagnosis present

## 2016-10-14 DIAGNOSIS — I959 Hypotension, unspecified: Secondary | ICD-10-CM | POA: Diagnosis present

## 2016-10-14 DIAGNOSIS — F1410 Cocaine abuse, uncomplicated: Secondary | ICD-10-CM | POA: Diagnosis present

## 2016-10-14 LAB — CBC WITH DIFFERENTIAL/PLATELET
BASOS PCT: 0 %
Basophils Absolute: 0 10*3/uL (ref 0.0–0.1)
EOS ABS: 0 10*3/uL (ref 0.0–0.7)
Eosinophils Relative: 0 %
HEMATOCRIT: 30.6 % — AB (ref 39.0–52.0)
Hemoglobin: 9.9 g/dL — ABNORMAL LOW (ref 13.0–17.0)
LYMPHS ABS: 0.9 10*3/uL (ref 0.7–4.0)
Lymphocytes Relative: 18 %
MCH: 23.7 pg — AB (ref 26.0–34.0)
MCHC: 32.4 g/dL (ref 30.0–36.0)
MCV: 73.2 fL — ABNORMAL LOW (ref 78.0–100.0)
MONO ABS: 0.4 10*3/uL (ref 0.1–1.0)
MONOS PCT: 8 %
Neutro Abs: 3.7 10*3/uL (ref 1.7–7.7)
Neutrophils Relative %: 74 %
Platelets: 263 10*3/uL (ref 150–400)
RBC: 4.18 MIL/uL — ABNORMAL LOW (ref 4.22–5.81)
RDW: 18.3 % — ABNORMAL HIGH (ref 11.5–15.5)
WBC: 5 10*3/uL (ref 4.0–10.5)

## 2016-10-14 LAB — BASIC METABOLIC PANEL
Anion gap: 10 (ref 5–15)
BUN: 7 mg/dL (ref 6–20)
CALCIUM: 8.6 mg/dL — AB (ref 8.9–10.3)
CO2: 27 mmol/L (ref 22–32)
CREATININE: 0.75 mg/dL (ref 0.61–1.24)
Chloride: 94 mmol/L — ABNORMAL LOW (ref 101–111)
GFR calc Af Amer: >60 mL/min (ref >60)
GFR calc non Af Amer: >60 mL/min (ref >60)
Glucose, Bld: 98 mg/dL (ref 65–99)
Potassium: 4.1 mmol/L (ref 3.5–5.1)
Sodium: 131 mmol/L — ABNORMAL LOW (ref 135–145)

## 2016-10-14 LAB — I-STAT CG4 LACTIC ACID, ED: LACTIC ACID, VENOUS: 1.29 mmol/L (ref 0.5–1.9)

## 2016-10-14 MED ORDER — PRO-STAT SUGAR FREE PO LIQD
30.0000 mL | Freq: Two times a day (BID) | ORAL | Status: DC
  Administered 2016-10-15: 30 mL via ORAL
  Filled 2016-10-14: qty 30

## 2016-10-14 MED ORDER — SODIUM CHLORIDE 0.9 % IV SOLN
INTRAVENOUS | Status: DC
  Administered 2016-10-14: 23:00:00 via INTRAVENOUS

## 2016-10-14 MED ORDER — SODIUM CHLORIDE 0.9 % IV BOLUS (SEPSIS)
1000.0000 mL | Freq: Once | INTRAVENOUS | Status: AC
  Administered 2016-10-14: 1000 mL via INTRAVENOUS

## 2016-10-14 MED ORDER — CLINDAMYCIN PHOSPHATE 600 MG/50ML IV SOLN
600.0000 mg | Freq: Once | INTRAVENOUS | Status: AC
  Administered 2016-10-14: 600 mg via INTRAVENOUS
  Filled 2016-10-14: qty 50

## 2016-10-14 NOTE — H&P (Addendum)
[facility] H&P   Patient Demographics:    Kuper Rennels, is a 46 y.o. male  MRN: [id number]   DOB - [birth year]  Admit Date - 10/14/2016  Outpatient Primary MD for the patient is Rosita Fire, MD  Referring MD/NP/PA: Julianne Rice  Outpatient Specialists:  Dr. Alvy Bimler (hematology) Dr. Maryjean Ka (pain)  Patient coming from:  home  Chief Complaint  Patient presents with  . Leg Swelling      HPI:    Darroll Bredeson  is a 46 y.o. male, w chronic back pain, diffuse large B cell lymphoma apparently presents w c/o boils for a week.  Pt has been taking doxycycline and this has not gone away.  Pt also notes swelling of the lower ext.  Pt admits that he has been picking at his sores.   In ED, wbc 5.0, Hgb 9.9, Plt 263,  Na 128=> 131, Bun/creat 7/0.75.  Pt will be admitted for bilateral lower ext edema and also sores/ cellulitis    Review of systems:    In addition to the HPI above,  No Fever-chills, No Headache, No changes with Vision or hearing, No problems swallowing food or Liquids, No Chest pain, Cough or Shortness of Breath, No Abdominal pain, No Nausea or Vommitting, Bowel movements are regular, No Blood in stool or Urine, No dysuria,  No new joints pains-aches,  No new weakness, tingling, numbness in any extremity, No recent weight gain or loss, No polyuria, polydypsia or polyphagia, No significant Mental Stressors.  A full 10 point Review of Systems was done, except as stated above, all other Review of Systems were negative.   With Past History of the following :    Past Medical History:  Diagnosis Date  . Bone pain 12/26/2012  . Cancer ([place])   . Chronic back pain   . Chronic neck pain    Hx C7 fx after fall 01/2014  . Compression fracture 01/2014   L1  . Diffuse large B-cell lymphoma ([place])   . Dysphagia, unspecified(787.20) 05/16/2012  . GERD  (gastroesophageal reflux disease)   . Lumbago 01/06/2013  . Lymphoma ([place])   . Neuropathic pain 11/06/2012  . Neuropathy 12/26/2012  . Pain management   . Pinched nerve in neck   . Pneumonia    April 2014  . 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 Radiation 10/30/12-11/25/12   Bilateral Neck/Tonsil  . Radiation 10/30/12-11/25/12   Bilateral neck and tonsils      Past Surgical History:  Procedure Laterality Date  . DIRECT LARYNGOSCOPY Left 05/08/2012   Procedure: DIRECT LARYNGOSCOPY with biopsy left tonsil;  Surgeon: Melissa Montane, MD;  Location: [facility];  Service: ENT;  Laterality: Left;  . NO PAST SURGERIES    . right eyebrown bone     Right frontal skull fracture      Social History:     Social History  Substance Use  Topics  . Smoking status: Current Every Day Smoker    Packs/day: 0.50    Years: 25.00    Types: Cigarettes  . Smokeless tobacco: Never Used  . Alcohol use No     Lives - at home by self  Mobility - walks by self   Family History :     Family History  Problem Relation Age of Onset  . Cancer Father        lung  . Diabetes Father   . Cancer Maternal Aunt        gyn cancer, pancreas  . Cancer Paternal Aunt        pancreas cancer  . Cancer Paternal Uncle        unk  . Cancer Sister       Home Medications:   Prior to Admission medications   Medication Sig Start Date End Date Taking? Authorizing Provider  doxycycline (VIBRA-TABS) 100 MG tablet Take 1 tablet by mouth 2 (two) times daily. 08/24/16  Yes [provider]  feeding supplement, ENSURE ENLIVE, (ENSURE ENLIVE) LIQD Take 237 mLs by mouth 3 (three) times daily between meals. 08/05/14  Yes Mikhail, Maryann, DO  gabapentin (NEURONTIN) 300 MG capsule Take 600 mg by mouth 3 (three) times daily.   Yes [provider]  levETIRAcetam (KEPPRA) 250 MG tablet Take 250 mg by mouth 2 (two) times daily. 11/29/14  Yes [provider]  levothyroxine (SYNTHROID, LEVOTHROID) 25 MCG  tablet Take 25 mcg by mouth daily before breakfast.   Yes [provider]  oxyCODONE (ROXICODONE) 15 MG immediate release tablet Take 15 mg by mouth every 6 (six) hours as needed for pain.  09/27/15  Yes [provider]     Allergies:     Allergies  Allergen Reactions  . Vancomycin Itching and Rash    Extreme Itching, red skin from head to torso.    Marland Kitchen Cymbalta [Duloxetine Hcl] Other (See Comments)    Suicidal and "crazy"  . Penicillins Hives    Has patient had a PCN reaction causing immediate rash, facial/tongue/throat swelling, SOB or lightheadedness with hypotension: no Has patient had a PCN reaction causing severe rash involving mucus membranes or skin necrosis: no Has patient had a PCN reaction that required hospitalization no Has patient had a PCN reaction occurring within the last 10 years: no If all of the above answers are "NO", then may proceed with Cephalosporin use.   Marland Kitchen Amoxicillin Rash  . Hydrocodone Rash     Physical Exam:   Vitals  Blood pressure 99/63, pulse 67, temperature 98.5 F (36.9 C), temperature source Oral, resp. rate 13, height 5\' 6"  (1.676 m), weight 48.5 kg (107 lb), SpO2 100 %.   1. General lying in bed in NAD,    2. Normal affect and insight, Not Suicidal or Homicidal, Awake Alert, Oriented X 3.  3. No F.N deficits, ALL C.Nerves Intact, Strength 5/5 all 4 extremities, Sensation intact all 4 extremities, Plantars down going.  4. Ears and Eyes appear Normal, Conjunctivae clear, PERRLA. Moist Oral Mucosa.  5. Supple Neck, No JVD, No cervical lymphadenopathy appriciated, No Carotid Bruits.  6. Symmetrical Chest wall movement, Good air movement bilaterally, CTAB.  7. RRR, No Gallops, Rubs or Murmurs, No Parasternal Heave.  8. Positive Bowel Sounds, Abdomen Soft, No tenderness, No organomegaly appriciated,No rebound -guarding or rigidity.  9.  No Cyanosis, Normal Skin Turgor, + bilateral pedal edema, negative homans  10. Good  muscle tone,  joints appear normal ,  no effusions, Normal ROM.  11. No Palpable Lymph Nodes in Neck or Axillae   Sore on right distal forearm lateral aspect about 1cm, and also over the left mid forearm, and 1 on each foot on the medial aspect just distal to ankles,  Mild surrounding erythema of the lesions.    Data Review:    CBC  Recent Labs Lab 10/12/16 1303 10/14/16 2145  WBC 4.6 5.0  HGB 9.6* 9.9*  HCT 29.2* 30.6*  PLT 240 263  MCV 72.4* 73.2*  MCH 23.9* 23.7*  MCHC 33.0 32.4  RDW 20.1* 18.3*  LYMPHSABS 1.3 0.9  MONOABS 0.4 0.4  EOSABS 0.0 0.0  BASOSABS 0.0 0.0   ------------------------------------------------------------------------------------------------------------------  Chemistries   Recent Labs Lab 10/12/16 1303 10/14/16 2145  NA 128* 131*  K 4.3 4.1  CL  --  94*  CO2 25 27  GLUCOSE 118 98  BUN 8.0 7  CREATININE 0.9 0.75  CALCIUM 8.2* 8.6*  AST 31  --   ALT 16  --   ALKPHOS 97  --   BILITOT 1.11  --    ------------------------------------------------------------------------------------------------------------------ estimated creatinine clearance is 79.1 mL/min (by C-G formula based on SCr of 0.75 mg/dL). ------------------------------------------------------------------------------------------------------------------  Recent Labs  10/12/16 1303  TSH 5.994*    Coagulation profile No results for input(s): INR, PROTIME in the last 168 hours. ------------------------------------------------------------------------------------------------------------------- No results for input(s): DDIMER in the last 72 hours. -------------------------------------------------------------------------------------------------------------------  Cardiac Enzymes No results for input(s): CKMB, TROPONINI, MYOGLOBIN in the last 168 hours.  Invalid input(s):  CK ------------------------------------------------------------------------------------------------------------------ No results found for: BNP   ---------------------------------------------------------------------------------------------------------------  Urinalysis    Component Value Date/Time   COLORURINE YELLOW 07/31/2014 [place] 07/31/2014 1612   LABSPEC 1.020 07/31/2014 1612   PHURINE 6.5 07/31/2014 1612   GLUCOSEU NEGATIVE 07/31/2014 1612   HGBUR NEGATIVE 07/31/2014 1612   BILIRUBINUR NEGATIVE 07/31/2014 1612   KETONESUR NEGATIVE 07/31/2014 1612   PROTEINUR NEGATIVE 07/31/2014 1612   UROBILINOGEN 0.2 07/31/2014 1612   NITRITE NEGATIVE 07/31/2014 1612   LEUKOCYTESUR NEGATIVE 07/31/2014 1612    ----------------------------------------------------------------------------------------------------------------   Imaging Results:    No results found.   Assessment & Plan:    Principal Problem:   Cellulitis Active Problems:   Iron deficiency anemia   Hyponatremia    Skin ulcers, cellulitis Wound culture levaquin 500mg  iv qday  Hyponatremia Hydrate with ns iv Check urne sodium urine osm Serum osm, tsh, cortisol Check cmp in am  Hypotension Trop I q6h x3 Check echo Check cortisol  Anemia Check cbc in am  Severe protein calorie malnutrition prostat  Chronic pain Cont gabapentin,  Cont keppra Cont oxycodone \ Hypothyroidism Cont levothyroxine  DVT Prophylaxis Lovenox - SCDs   AM Labs Ordered, also please review Full Orders  Family Communication: Admission, patients condition and plan of care including tests being ordered have been discussed with the patient  who indicate understanding and agree with the plan and Code Status.  Code Status FULL CODE  Likely DC to  home  Condition GUARDED    Consults called: none  Admission status: inpatient   Time spent in minutes : 45 minutes   Jani Gravel M.D on 10/14/2016 at 11:32  PM  Between 7am to 7pm - Pager - [phone] After 7pm go to [email or web address] - password [facility]  [facility] - Office  [phone]

## 2016-10-14 NOTE — ED Provider Notes (Signed)
[facility] DEPT Provider Note   CSN: [id number] Arrival date & time: 10/14/16  2049     History   Chief Complaint Chief Complaint  Patient presents with  . Leg Swelling    HPI Nicholas King is a 46 y.o. male presenting with progressive onset worsening bilateral skin wounds, erythema and edema over the past week. He noted drainage. States that he has been vomiting for 2 days denies nausea at this time. He has been experiencing subjective fevers and chills. He states that he was treated with antibiotics for similar wounds on his arms without improvement and now is experiencing worsening with lower extremity involvement. Patient reports 5 year remission from lymphoma.   HPI  Past Medical History:  Diagnosis Date  . Bone pain 12/26/2012  . Cancer ([place])   . Chronic back pain   . Chronic neck pain    Hx C7 fx after fall 01/2014  . Compression fracture 01/2014   L1  . 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 Dysphagia, unspecified(787.20) 05/16/2012  . GERD (gastroesophageal reflux disease)   . Lumbago 01/06/2013  . Lymphoma ([place])   . Neuropathic pain 11/06/2012  . Neuropathy 12/26/2012  . Pain management   . Pinched nerve in neck   . Pneumonia    April 2014  . 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 Radiation 10/30/12-11/25/12   Bilateral Neck/Tonsil  . Radiation 10/30/12-11/25/12   Bilateral neck and tonsils    Patient Active Problem List   Diagnosis Date Noted  . Cellulitis 10/14/2016  . Hyponatremia 10/14/2016  . Skin ulcers of both feet ([place])   . Acquired hypothyroidism 10/12/2016  . Iron deficiency anemia 10/12/2016  . Bug bites 10/12/2016  . Chronic midline back pain 10/13/2015  . Anemia in chronic illness 01/20/2015  . Underweight 12/30/2014  . CAP (community acquired pneumonia) 12/28/2014  . Encounter for central line placement   . Malnutrition of moderate degree ([place]) 08/03/2014  . Fever chills 07/31/2014  . Sepsis ([place]) 07/31/2014  . Leukopenia  07/20/2014  . Thrombocytopenia ([place]) 07/20/2014  . Tobacco abuse 07/20/2014  . Compression fracture 01/19/2014  . Dysphagia 07/20/2013  . Poor dentition 07/20/2013  . Meralgia paraesthetica 01/06/2013  . Lumbago 01/06/2013  . Neuropathy ([place]) 12/26/2012  . Bone pain 12/26/2012  . Neuropathic pain 11/06/2012  . Cachexia ([place]) 05/16/2012  . History of B-cell lymphoma 05/16/2012  . Dysphagia, unspecified(787.20) 05/16/2012  . GERD (gastroesophageal reflux disease) 05/16/2012  . Thyroid nodule 05/16/2012  . Cigarette smoker 05/16/2012    Past Surgical History:  Procedure Laterality Date  . DIRECT LARYNGOSCOPY Left 05/08/2012   Procedure: DIRECT LARYNGOSCOPY with biopsy left tonsil;  Surgeon: Melissa Montane, MD;  Location: [facility];  Service: ENT;  Laterality: Left;  . NO PAST SURGERIES    . right eyebrown bone     Right frontal skull fracture       Home Medications    Prior to Admission medications   Medication Sig Start Date End Date Taking? Authorizing Provider  doxycycline (VIBRA-TABS) 100 MG tablet Take 1 tablet by mouth 2 (two) times daily. 08/24/16  Yes [provider]  feeding supplement, ENSURE ENLIVE, (ENSURE ENLIVE) LIQD Take 237 mLs by mouth 3 (three) times daily between meals. 08/05/14  Yes Mikhail, Maryann, DO  gabapentin (NEURONTIN) 300 MG capsule Take 600 mg by mouth 3 (three) times daily.   Yes [provider]  levETIRAcetam (KEPPRA) 250 MG tablet Take 250 mg by mouth 2 (two) times daily.  11/29/14  Yes [provider]  levothyroxine (SYNTHROID, LEVOTHROID) 25 MCG tablet Take 25 mcg by mouth daily before breakfast.   Yes [provider]  oxyCODONE (ROXICODONE) 15 MG immediate release tablet Take 15 mg by mouth every 6 (six) hours as needed for pain.  09/27/15  Yes [provider]    Family History Family History  Problem Relation Age of Onset  . Cancer Father        lung  . Diabetes Father   . Cancer Maternal Aunt         gyn cancer, pancreas  . Cancer Paternal Aunt        pancreas cancer  . Cancer Paternal Uncle        unk  . Cancer Sister     Social History Social History  Substance Use Topics  . Smoking status: Current Every Day Smoker    Packs/day: 0.50    Years: 25.00    Types: Cigarettes  . Smokeless tobacco: Never Used  . Alcohol use No     Allergies   Vancomycin; Cymbalta [duloxetine hcl]; Penicillins; Amoxicillin; and Hydrocodone   Review of Systems Review of Systems  Constitutional: Positive for chills and fever.  HENT: Negative for sore throat.   Respiratory: Negative for cough, shortness of breath, wheezing and stridor.   Cardiovascular: Positive for leg swelling. Negative for chest pain and palpitations.       (feet edematous)  Gastrointestinal: Positive for nausea and vomiting. Negative for abdominal distention, abdominal pain and diarrhea.  Genitourinary: Negative for difficulty urinating, dysuria and hematuria.  Musculoskeletal: Positive for myalgias. Negative for arthralgias and back pain.  Skin: Positive for color change and wound. Negative for rash.  Neurological: Negative for dizziness, seizures, syncope, light-headedness and headaches.     Physical Exam Updated Vital Signs BP 94/63   Pulse 69   Temp 98.5 F (36.9 C) (Oral)   Resp 15   Ht 5\' 6"  (1.676 m)   Wt 48.5 kg (107 lb)   SpO2 100%   BMI 17.27 kg/m   Physical Exam  Constitutional: He appears well-developed and well-nourished. No distress.  Afebrile, chronically ill-appearing, cachectic lying comfortably in bed in no acute distress.  HENT:  Head: Normocephalic and atraumatic.  Eyes: Conjunctivae are normal.  Neck: Neck supple.  Cardiovascular: Normal rate, regular rhythm, normal heart sounds and intact distal pulses.   No murmur heard. Pulmonary/Chest: Effort normal and breath sounds normal. No respiratory distress. He has no wheezes.  Abdominal: Soft. He exhibits no distension. There is no  tenderness.  Musculoskeletal: Normal range of motion. He exhibits edema and tenderness.  Patient with erythema, warmth, edema of the foot bilaterally surrounding open wounds   Neurological: He is alert. No sensory deficit.  Skin: Skin is warm and dry. He is not diaphoretic. There is erythema. No pallor.  Wounds approximately 2 cm on the dorsum of the foot bilaterally  Psychiatric: He has a normal mood and affect.  Nursing note and vitals reviewed.    ED Treatments / Results  Labs (all labs ordered are listed, but only abnormal results are displayed) Labs Reviewed  BASIC METABOLIC PANEL - Abnormal; Notable for the following:       Result Value   Sodium 131 (*)    Chloride 94 (*)    Calcium 8.6 (*)    All other components within normal limits  CBC WITH DIFFERENTIAL/PLATELET - Abnormal; Notable for the following:    RBC 4.18 (*)  Hemoglobin 9.9 (*)    HCT 30.6 (*)    MCV 73.2 (*)    MCH 23.7 (*)    RDW 18.3 (*)    All other components within normal limits  CULTURE, BLOOD (ROUTINE X 2)  CULTURE, BLOOD (ROUTINE X 2)  AEROBIC CULTURE (SUPERFICIAL SPECIMEN)  RAPID URINE DRUG SCREEN, HOSP PERFORMED  I-STAT CG4 LACTIC ACID, ED    EKG  EKG Interpretation None       Radiology No results found.  Procedures Procedures (including critical care time)  Medications Ordered in ED Medications  sodium chloride 0.9 % bolus 1,000 mL (0 mLs Intravenous Stopped 10/14/16 2328)    And  0.9 %  sodium chloride infusion ( Intravenous New Bag/Given 10/14/16 2328)  feeding supplement (PRO-STAT SUGAR FREE 64) liquid 30 mL (not administered)  clindamycin (CLEOCIN) IVPB 600 mg (0 mg Intravenous Stopped 10/14/16 2245)     Initial Impression / Assessment and Plan / ED Course  I have reviewed the triage vital signs and the nursing notes.  Pertinent labs & imaging results that were available during my care of the patient were reviewed by me and considered in my medical decision making (see  chart for details).    Patient presenting with cellulitis of the lower extremities bilaterally and nonhealing wounds. Patient has been treated for upper extremity wounds and cellulitis with doxy by PCP and reports little improvement and now has lower extremity wounds and cellulitis bilaterally.  Patient has failed outpatient therapy and will need to be admitted for antibiotic therapy.  11:15-- spoke to Dr. Maudie Mercury hospitalist who will be admitting patient.   Final Clinical Impressions(s) / ED Diagnoses   Final diagnoses:  Cellulitis of lower extremity, unspecified laterality  Skin ulcers of both feet [facility]    New Prescriptions New Prescriptions   No medications on file     Dossie Der 10/15/16 Tito Dine, MD 10/19/16 [phone]

## 2016-10-14 NOTE — ED Notes (Signed)
ED Provider at bedside. 

## 2016-10-14 NOTE — ED Triage Notes (Signed)
Pt  C/o bilateral lower leg swelling that started a week ago along with multiple areas of open sores, pt states that he was given antibiotic 4-5 days but the areas have not improved.

## 2016-10-14 NOTE — ED Notes (Signed)
Pt updated on plan of care,  

## 2016-10-14 NOTE — ED Notes (Addendum)
Manual bp 100/70, fluids continue to infuse per order

## 2016-10-14 NOTE — ED Notes (Signed)
Dr Kim at bedside,  

## 2016-10-15 ENCOUNTER — Encounter (HOSPITAL_COMMUNITY): Payer: Self-pay

## 2016-10-15 DIAGNOSIS — L97519 Non-pressure chronic ulcer of other part of right foot with unspecified severity: Secondary | ICD-10-CM | POA: Diagnosis not present

## 2016-10-15 DIAGNOSIS — R64 Cachexia: Secondary | ICD-10-CM | POA: Diagnosis not present

## 2016-10-15 DIAGNOSIS — D509 Iron deficiency anemia, unspecified: Secondary | ICD-10-CM

## 2016-10-15 DIAGNOSIS — E871 Hypo-osmolality and hyponatremia: Secondary | ICD-10-CM | POA: Diagnosis not present

## 2016-10-15 DIAGNOSIS — E43 Unspecified severe protein-calorie malnutrition: Secondary | ICD-10-CM | POA: Diagnosis not present

## 2016-10-15 DIAGNOSIS — L03119 Cellulitis of unspecified part of limb: Secondary | ICD-10-CM | POA: Diagnosis not present

## 2016-10-15 DIAGNOSIS — Z681 Body mass index (BMI) 19 or less, adult: Secondary | ICD-10-CM | POA: Diagnosis not present

## 2016-10-15 DIAGNOSIS — L03115 Cellulitis of right lower limb: Secondary | ICD-10-CM | POA: Diagnosis not present

## 2016-10-15 DIAGNOSIS — C8330 Diffuse large B-cell lymphoma, unspecified site: Secondary | ICD-10-CM | POA: Diagnosis not present

## 2016-10-15 LAB — RAPID URINE DRUG SCREEN, HOSP PERFORMED
Amphetamines: NOT DETECTED
BARBITURATES: NOT DETECTED
BENZODIAZEPINES: NOT DETECTED
Cocaine: POSITIVE — AB
Opiates: POSITIVE — AB
TETRAHYDROCANNABINOL: NOT DETECTED

## 2016-10-15 LAB — CBC
HCT: 26.1 % — ABNORMAL LOW (ref 39.0–52.0)
Hemoglobin: 8.2 g/dL — ABNORMAL LOW (ref 13.0–17.0)
MCH: 23.4 pg — ABNORMAL LOW (ref 26.0–34.0)
MCHC: 31.4 g/dL (ref 30.0–36.0)
MCV: 74.6 fL — ABNORMAL LOW (ref 78.0–100.0)
PLATELETS: 171 10*3/uL (ref 150–400)
RBC: 3.5 MIL/uL — ABNORMAL LOW (ref 4.22–5.81)
RDW: 18.5 % — AB (ref 11.5–15.5)
WBC: 2.9 10*3/uL — AB (ref 4.0–10.5)

## 2016-10-15 LAB — COMPREHENSIVE METABOLIC PANEL
ALBUMIN: 2.3 g/dL — AB (ref 3.5–5.0)
ALT: 13 U/L — AB (ref 17–63)
AST: 21 U/L (ref 15–41)
Alkaline Phosphatase: 64 U/L (ref 38–126)
Anion gap: 6 (ref 5–15)
BILIRUBIN TOTAL: 0.7 mg/dL (ref 0.3–1.2)
BUN: 7 mg/dL (ref 6–20)
CALCIUM: 7.7 mg/dL — AB (ref 8.9–10.3)
CO2: 25 mmol/L (ref 22–32)
CREATININE: 0.63 mg/dL (ref 0.61–1.24)
Chloride: 103 mmol/L (ref 101–111)
GFR calc Af Amer: >60 mL/min (ref >60)
GLUCOSE: 98 mg/dL (ref 65–99)
Potassium: 3.8 mmol/L (ref 3.5–5.1)
Sodium: 134 mmol/L — ABNORMAL LOW (ref 135–145)
TOTAL PROTEIN: 5.3 g/dL — AB (ref 6.5–8.1)

## 2016-10-15 LAB — SODIUM, URINE, RANDOM: Sodium, Ur: 14 mmol/L

## 2016-10-15 LAB — OSMOLALITY, URINE: Osmolality, Ur: 118 mOsm/kg — ABNORMAL LOW (ref 300–900)

## 2016-10-15 LAB — OSMOLALITY: OSMOLALITY: 268 mosm/kg — AB (ref 275–295)

## 2016-10-15 LAB — TSH: TSH: 2.773 u[IU]/mL (ref 0.350–4.500)

## 2016-10-15 LAB — TROPONIN I: Troponin I: <0.03 ng/mL (ref <0.03)

## 2016-10-15 LAB — CORTISOL: CORTISOL PLASMA: 10.1 ug/dL

## 2016-10-15 MED ORDER — ACETAMINOPHEN 325 MG PO TABS: 650.0000 mg | ORAL_TABLET | Freq: Four times a day (QID) | ORAL | Status: DC | PRN

## 2016-10-15 MED ORDER — ENSURE ENLIVE PO LIQD: 237.0000 mL | Freq: Three times a day (TID) | ORAL | Status: DC

## 2016-10-15 MED ORDER — ENOXAPARIN SODIUM 40 MG/0.4ML ~~LOC~~ SOLN
40.0000 mg | SUBCUTANEOUS | Status: DC
  Filled 2016-10-15: qty 0.4

## 2016-10-15 MED ORDER — LEVOFLOXACIN IN D5W 500 MG/100ML IV SOLN
500.0000 mg | INTRAVENOUS | Status: DC
Start: 2016-10-15 — End: 2016-10-15

## 2016-10-15 MED ORDER — GABAPENTIN 300 MG PO CAPS
600.0000 mg | ORAL_CAPSULE | Freq: Three times a day (TID) | ORAL | Status: DC
  Administered 2016-10-15: 600 mg via ORAL
  Filled 2016-10-15: qty 2

## 2016-10-15 MED ORDER — ACETAMINOPHEN 650 MG RE SUPP: 650.0000 mg | Freq: Four times a day (QID) | RECTAL | Status: DC | PRN

## 2016-10-15 MED ORDER — SODIUM CHLORIDE 0.9 % IV SOLN
INTRAVENOUS | Status: DC
  Administered 2016-10-15 (×2): via INTRAVENOUS

## 2016-10-15 MED ORDER — LEVOTHYROXINE SODIUM 25 MCG PO TABS
25.0000 ug | ORAL_TABLET | Freq: Every day | ORAL | Status: DC
  Administered 2016-10-15: 25 ug via ORAL
  Filled 2016-10-15: qty 1

## 2016-10-15 MED ORDER — OXYCODONE HCL 5 MG PO TABS
15.0000 mg | ORAL_TABLET | Freq: Four times a day (QID) | ORAL | Status: DC | PRN
  Administered 2016-10-15 (×3): 15 mg via ORAL
  Filled 2016-10-15 (×3): qty 3

## 2016-10-15 MED ORDER — DOXYCYCLINE HYCLATE 100 MG PO CAPS: 100.0000 mg | ORAL_CAPSULE | Freq: Two times a day (BID) | ORAL | 0 refills | Status: DC

## 2016-10-15 MED ORDER — LEVETIRACETAM 250 MG PO TABS
250.0000 mg | ORAL_TABLET | Freq: Two times a day (BID) | ORAL | Status: DC
  Administered 2016-10-15 (×2): 250 mg via ORAL
  Filled 2016-10-15 (×2): qty 1

## 2016-10-15 MED ORDER — LEVOFLOXACIN IN D5W 500 MG/100ML IV SOLN
500.0000 mg | Freq: Once | INTRAVENOUS | Status: AC
  Administered 2016-10-15: 500 mg via INTRAVENOUS
  Filled 2016-10-15: qty 100

## 2016-10-15 NOTE — Discharge Summary (Signed)
Physician Discharge Summary  Nicholas King JKK:[id number] DOB: 28-Jun-1970 DOA: 10/14/2016  PCP: Rosita Fire, MD  Admit date: 10/14/2016 Discharge date: 10/15/2016  Time spent: 45 minutes  Recommendations for Outpatient Follow-up:  -Will be discharged home today. -SW will see prior to discharge to discuss possibilities for alternative living arrangements. -Will need to complete an 8 day course of doxy for cellulitis.   Discharge Diagnoses:  Principal Problem:   Cellulitis Active Problems:   Iron deficiency anemia   Hyponatremia   Discharge Condition: Stable and improved  Filed Weights   10/14/16 2055 10/15/16 0054 10/15/16 0500  Weight: 48.5 kg (107 lb) 48.5 kg (107 lb 0.5 oz) 48.8 kg (107 lb 9.4 oz)    History of present illness:  As per Dr. Maudie Mercury on 9/9: Nicholas King  is a 46 y.o. male, w chronic back pain, diffuse large B cell lymphoma apparently presents w c/o boils for a week.  Pt has been taking doxycycline and this has not gone away.  Pt also notes swelling of the lower ext.  Pt admits that he has been picking at his sores.   In ED, wbc 5.0, Hgb 9.9, Plt 263,  Na 128=> 131, Bun/creat 7/0.75.  Pt will be admitted for bilateral lower ext edema and also sores/ cellulitis   Hospital Course:   Bilateral LE Cellulitis -He has bug bites (?bed bugs) over a large surface of his body and several areas of severe excoriations due to scratching. Two of these areas over his feet have become superinfected with surrounding cellulitis. -Will prescribe an 8 day course of doxycycline. -Patient interested in alternative living arrangements. Will ask SW to see.  Cocaine Abuse -Counseled on cessation.  Lymphoma -Followed by Dr. Alvy Bimler. Last seen on 10/12/16.  Severe Protein-Caloric Malnutrition -Ensure BID.  Iron Deficiency Anemia -To continue iron supplementation. -Nutrition should help as well.   Procedures:  None   Consultations:  None  Discharge  Instructions  Discharge Instructions    Increase activity slowly    Complete by:  As directed      Allergies as of 10/15/2016      Reactions   Vancomycin Itching, Rash   Extreme Itching, red skin from head to torso.     Cymbalta [duloxetine Hcl] Other (See Comments)   Suicidal and "crazy"   Penicillins Hives   Has patient had a PCN reaction causing immediate rash, facial/tongue/throat swelling, SOB or lightheadedness with hypotension: no Has patient had a PCN reaction causing severe rash involving mucus membranes or skin necrosis: no Has patient had a PCN reaction that required hospitalization no Has patient had a PCN reaction occurring within the last 10 years: no If all of the above answers are "NO", then may proceed with Cephalosporin use.   Amoxicillin Rash   Hydrocodone Rash      Medication List    STOP taking these medications   doxycycline 100 MG tablet Commonly known as:  VIBRA-TABS Replaced by:  doxycycline 100 MG capsule     TAKE these medications   doxycycline 100 MG capsule Commonly known as:  VIBRAMYCIN Take 1 capsule (100 mg total) by mouth 2 (two) times daily. Replaces:  doxycycline 100 MG tablet   feeding supplement (ENSURE ENLIVE) Liqd Take 237 mLs by mouth 3 (three) times daily between meals.   gabapentin 300 MG capsule Commonly known as:  NEURONTIN Take 600 mg by mouth 3 (three) times daily.   levETIRAcetam 250 MG tablet Commonly known as:  KEPPRA Take  250 mg by mouth 2 (two) times daily.   levothyroxine 25 MCG tablet Commonly known as:  SYNTHROID, LEVOTHROID Take 25 mcg by mouth daily before breakfast.   oxyCODONE 15 MG immediate release tablet Commonly known as:  ROXICODONE Take 15 mg by mouth every 6 (six) hours as needed for pain.            Discharge Care Instructions        Start     Ordered   10/15/16 0000  doxycycline (VIBRAMYCIN) 100 MG capsule  2 times daily     10/15/16 1116   10/15/16 0000  Increase activity slowly      10/15/16 1116     Allergies  Allergen Reactions  . Vancomycin Itching and Rash    Extreme Itching, red skin from head to torso.    Marland Kitchen Cymbalta [Duloxetine Hcl] Other (See Comments)    Suicidal and "crazy"  . Penicillins Hives    Has patient had a PCN reaction causing immediate rash, facial/tongue/throat swelling, SOB or lightheadedness with hypotension: no Has patient had a PCN reaction causing severe rash involving mucus membranes or skin necrosis: no Has patient had a PCN reaction that required hospitalization no Has patient had a PCN reaction occurring within the last 10 years: no If all of the above answers are "NO", then may proceed with Cephalosporin use.   Marland Kitchen Amoxicillin Rash  . Hydrocodone Rash   Follow-up Information    Rosita Fire, MD. Schedule an appointment as soon as possible for a visit in 2 week(s).   Specialty:  Internal Medicine Contact information: [place] [id number] [phone]            The results of significant diagnostics from this hospitalization (including imaging, microbiology, ancillary and laboratory) are listed below for reference.    Significant Diagnostic Studies: No results found.  Microbiology: Recent Results (from the past 240 hour(s))  Culture, blood (routine x 2)     Status: None (Preliminary result)   Collection Time: 10/14/16  9:45 PM  Result Value Ref Range Status   Specimen Description BLOOD LEFT ARM  Final   Special Requests Blood Culture adequate volume  Final   Culture NO GROWTH < 12 HOURS  Final   Report Status PENDING  Incomplete  Blood culture (routine x 2)     Status: None (Preliminary result)   Collection Time: 10/14/16  9:56 PM  Result Value Ref Range Status   Specimen Description BLOOD RIGHT ARM  Final   Special Requests Blood Culture adequate volume  Final   Culture NO GROWTH < 12 HOURS  Final   Report Status PENDING  Incomplete     Labs: Basic Metabolic Panel:  Recent Labs Lab  10/12/16 1303 10/14/16 2145 10/15/16 0630  NA 128* 131* 134*  K 4.3 4.1 3.8  CL  --  94* 103  CO2 25 27 25   GLUCOSE 118 98 98  BUN 8.0 7 7  CREATININE 0.9 0.75 0.63  CALCIUM 8.2* 8.6* 7.7*   Liver Function Tests:  Recent Labs Lab 10/12/16 1303 10/15/16 0630  AST 31 21  ALT 16 13*  ALKPHOS 97 64  BILITOT 1.11 0.7  PROT 6.3* 5.3*  ALBUMIN 2.7* 2.3*   No results for input(s): LIPASE, AMYLASE in the last 168 hours. No results for input(s): AMMONIA in the last 168 hours. CBC:  Recent Labs Lab 10/12/16 1303 10/14/16 2145 10/15/16 0630  WBC 4.6 5.0 2.9*  NEUTROABS 2.8 3.7  --  HGB 9.6* 9.9* 8.2*  HCT 29.2* 30.6* 26.1*  MCV 72.4* 73.2* 74.6*  PLT 240 263 171   Cardiac Enzymes:  Recent Labs Lab 10/15/16 0142 10/15/16 0630  TROPONINI <0.03 <0.03   BNP: BNP (last 3 results) No results for input(s): BNP in the last 8760 hours.  ProBNP (last 3 results) No results for input(s): PROBNP in the last 8760 hours.  CBG: No results for input(s): GLUCAP in the last 168 hours.     SignedLelon Frohlich  [facility] Pager: [phone] 10/15/2016, 11:17 AM

## 2016-10-15 NOTE — Progress Notes (Signed)
PT has scabs/sore all over body from chest to feet. Pt states the sores could be bug bites. Pt states sores were oozing and had pus. Pt admits to picking and squeezing sores. Pt advised to leave sores OTA and not to touch due to increase risk of infection. Levequin administered. Continue to monitor.

## 2016-10-15 NOTE — Progress Notes (Signed)
Iv removed, telemetry removed, tolerated well, patient given discharge instructions at bedside.

## 2016-10-15 NOTE — Progress Notes (Signed)
ANTIBIOTIC CONSULT NOTE-Preliminary  Pharmacy Consult for Levofloxacin Indication: Cellulitis  Allergies  Allergen Reactions  . Vancomycin Itching and Rash    Extreme Itching, red skin from head to torso.    Marland Kitchen Cymbalta [Duloxetine Hcl] Other (See Comments)    Suicidal and "crazy"  . Penicillins Hives    Has patient had a PCN reaction causing immediate rash, facial/tongue/throat swelling, SOB or lightheadedness with hypotension: no Has patient had a PCN reaction causing severe rash involving mucus membranes or skin necrosis: no Has patient had a PCN reaction that required hospitalization no Has patient had a PCN reaction occurring within the last 10 years: no If all of the above answers are "NO", then may proceed with Cephalosporin use.   Marland Kitchen Amoxicillin Rash  . Hydrocodone Rash    Patient Measurements: Height: 5\' 6"  (167.6 cm) Weight: 107 lb 0.5 oz (48.5 kg) IBW/kg (Calculated) : 63.8   Vital Signs: Temp: 98.4 F (36.9 C) (09/10 0054) Temp Source: Oral (09/10 0054) BP: 100/58 (09/10 0054) Pulse Rate: 66 (09/10 0054)  Labs:  Recent Labs  10/12/16 1303 10/14/16 2145  WBC 4.6 5.0  HGB 9.6* 9.9*  PLT 240 263  CREATININE 0.9 0.75    Estimated Creatinine Clearance: 79.3 mL/min (by C-G formula based on SCr of 0.75 mg/dL).  No results for input(s): VANCOTROUGH, VANCOPEAK, VANCORANDOM, GENTTROUGH, GENTPEAK, GENTRANDOM, TOBRATROUGH, TOBRAPEAK, TOBRARND, AMIKACINPEAK, AMIKACINTROU, AMIKACIN in the last 72 hours.   Microbiology: Recent Results (from the past 720 hour(s))  Culture, blood (routine x 2)     Status: None (Preliminary result)   Collection Time: 10/14/16  9:45 PM  Result Value Ref Range Status   Specimen Description BLOOD LEFT ARM  Final   Special Requests Blood Culture adequate volume  Final   Culture PENDING  Incomplete   Report Status PENDING  Incomplete  Blood culture (routine x 2)     Status: None (Preliminary result)   Collection Time: 10/14/16  9:56 PM   Result Value Ref Range Status   Specimen Description BLOOD RIGHT ARM  Final   Special Requests Blood Culture adequate volume  Final   Culture PENDING  Incomplete   Report Status PENDING  Incomplete    Medical History: Past Medical History:  Diagnosis Date  . Bone pain 12/26/2012  . Cancer ([place])   . Chronic back pain   . Chronic neck pain    Hx C7 fx after fall 01/2014  . Compression fracture 01/2014   L1  . 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 Dysphagia, unspecified(787.20) 05/16/2012  . GERD (gastroesophageal reflux disease)   . Lumbago 01/06/2013  . Lymphoma ([place])   . Neuropathic pain 11/06/2012  . Neuropathy 12/26/2012  . Pain management   . Pinched nerve in neck   . Pneumonia    April 2014  . 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 Radiation 10/30/12-11/25/12   Bilateral Neck/Tonsil  . Radiation 10/30/12-11/25/12   Bilateral neck and tonsils    Medications:  Clindamycin 600 mg IV x 1 dose in the ED  Assessment: 46 yo male seen in the ED for bilateral skin wounds worsening over the past week. Received PO doxycycline without inprovement (patient admits picking at wounds). Pt reports wound drainage, increased redness and edema; nausea and vomiting x 2 days. Pharmacy has beed asked to dose levofloxacin for cellulitis.  Goal of Therapy:  Eradicate infection  Plan:  Preliminary review of pertinent patient information completed.  Protocol will be initiated with one dose of  levofloxacin 500 mg IV.  Forestine Na clinical pharmacist will complete review during morning rounds to assess patient and finalize treatment regimen if needed.  Norberto Sorenson, [facility] 10/15/2016,1:23 AM

## 2016-10-16 LAB — HIV ANTIBODY (ROUTINE TESTING W REFLEX): HIV SCREEN 4TH GENERATION: NONREACTIVE

## 2016-10-18 LAB — AEROBIC CULTURE  (SUPERFICIAL SPECIMEN)

## 2016-10-18 LAB — AEROBIC CULTURE W GRAM STAIN (SUPERFICIAL SPECIMEN)
Gram Stain: NONE SEEN
Special Requests: NORMAL

## 2016-10-19 LAB — BLOOD CULTURE ID PANEL (REFLEXED)
Acinetobacter baumannii: NOT DETECTED
CANDIDA GLABRATA: NOT DETECTED
CANDIDA KRUSEI: NOT DETECTED
CANDIDA PARAPSILOSIS: NOT DETECTED
CARBAPENEM RESISTANCE: NOT DETECTED
Candida albicans: NOT DETECTED
Candida tropicalis: NOT DETECTED
Enterobacter cloacae complex: NOT DETECTED
Enterobacteriaceae species: NOT DETECTED
Enterococcus species: NOT DETECTED
Escherichia coli: NOT DETECTED
Haemophilus influenzae: NOT DETECTED
KLEBSIELLA OXYTOCA: NOT DETECTED
KLEBSIELLA PNEUMONIAE: NOT DETECTED
Listeria monocytogenes: NOT DETECTED
Methicillin resistance: NOT DETECTED
Neisseria meningitidis: NOT DETECTED
Proteus species: NOT DETECTED
Pseudomonas aeruginosa: NOT DETECTED
SERRATIA MARCESCENS: NOT DETECTED
STREPTOCOCCUS AGALACTIAE: NOT DETECTED
STREPTOCOCCUS PNEUMONIAE: NOT DETECTED
Staphylococcus aureus (BCID): NOT DETECTED
Staphylococcus species: NOT DETECTED
Streptococcus pyogenes: NOT DETECTED
Streptococcus species: NOT DETECTED
VANCOMYCIN RESISTANCE: NOT DETECTED

## 2016-10-19 LAB — CULTURE, BLOOD (ROUTINE X 2)
CULTURE: NO GROWTH
Special Requests: ADEQUATE

## 2016-10-21 LAB — CULTURE, BLOOD (ROUTINE X 2): Special Requests: ADEQUATE

## 2016-10-23 DIAGNOSIS — M542 Cervicalgia: Secondary | ICD-10-CM | POA: Diagnosis not present

## 2016-10-25 DIAGNOSIS — C8331 Diffuse large B-cell lymphoma, lymph nodes of head, face, and neck: Secondary | ICD-10-CM | POA: Diagnosis not present

## 2016-10-25 DIAGNOSIS — M545 Low back pain: Secondary | ICD-10-CM | POA: Diagnosis not present

## 2016-10-25 DIAGNOSIS — F17200 Nicotine dependence, unspecified, uncomplicated: Secondary | ICD-10-CM | POA: Diagnosis not present

## 2016-10-25 DIAGNOSIS — R64 Cachexia: Secondary | ICD-10-CM | POA: Diagnosis not present

## 2016-11-26 DIAGNOSIS — Z681 Body mass index (BMI) 19 or less, adult: Secondary | ICD-10-CM | POA: Diagnosis not present

## 2016-11-26 DIAGNOSIS — M5416 Radiculopathy, lumbar region: Secondary | ICD-10-CM | POA: Diagnosis not present

## 2016-11-26 DIAGNOSIS — M542 Cervicalgia: Secondary | ICD-10-CM | POA: Diagnosis not present

## 2016-11-26 DIAGNOSIS — G620 Drug-induced polyneuropathy: Secondary | ICD-10-CM | POA: Diagnosis not present

## 2016-11-26 DIAGNOSIS — R030 Elevated blood-pressure reading, without diagnosis of hypertension: Secondary | ICD-10-CM | POA: Diagnosis not present

## 2016-12-31 DIAGNOSIS — M5416 Radiculopathy, lumbar region: Secondary | ICD-10-CM | POA: Diagnosis not present

## 2017-01-21 ENCOUNTER — Encounter (HOSPITAL_COMMUNITY): Payer: Self-pay | Admitting: Emergency Medicine

## 2017-01-21 ENCOUNTER — Emergency Department (HOSPITAL_COMMUNITY)
Admission: EM | Admit: 2017-01-21 | Discharge: 2017-01-21 | Disposition: A | Discharge status: Home / Self Care | Payer: Medicare Other | Attending: Emergency Medicine | Admitting: Emergency Medicine

## 2017-01-21 DIAGNOSIS — F17210 Nicotine dependence, cigarettes, uncomplicated: Secondary | ICD-10-CM | POA: Insufficient documentation

## 2017-01-21 DIAGNOSIS — Z79899 Other long term (current) drug therapy: Secondary | ICD-10-CM | POA: Insufficient documentation

## 2017-01-21 DIAGNOSIS — R2231 Localized swelling, mass and lump, right upper limb: Secondary | ICD-10-CM | POA: Diagnosis present

## 2017-01-21 DIAGNOSIS — L02413 Cutaneous abscess of right upper limb: Secondary | ICD-10-CM | POA: Insufficient documentation

## 2017-01-21 MED ORDER — SULFAMETHOXAZOLE-TRIMETHOPRIM 800-160 MG PO TABS: 1.0000 | ORAL_TABLET | Freq: Two times a day (BID) | ORAL | 0 refills | Status: AC

## 2017-01-21 MED ORDER — LIDOCAINE-EPINEPHRINE (PF) 2 %-1:200000 IJ SOLN
10.0000 mL | Freq: Once | INTRAMUSCULAR | Status: AC
  Administered 2017-01-21: 10 mL
  Filled 2017-01-21: qty 20

## 2017-01-21 MED ORDER — OXYCODONE-ACETAMINOPHEN 5-325 MG PO TABS
1.0000 | ORAL_TABLET | Freq: Once | ORAL | Status: AC
  Administered 2017-01-21: 1 via ORAL
  Filled 2017-01-21: qty 1

## 2017-01-21 NOTE — Discharge Instructions (Signed)
Please read attached information regarding your condition. Take Bactrim twice daily as directed. We will contact you with the results of your wound culture when available, and will let you know if there are any medication changes that need to be done. Follow up with your primary care provider for further evaluation. You can return to your primary care provider or here to ED in 3-4 days for wound recheck and packing removal. Take your home medications as previously prescribed. Return to ED for worsening symptoms, increased drainage from site, fevers, additional abscesses.

## 2017-01-21 NOTE — ED Triage Notes (Signed)
Pt c/o abscess to RT forearm without fever x 2-3 days.

## 2017-01-21 NOTE — ED Provider Notes (Signed)
[facility] EMERGENCY DEPARTMENT Provider Note   CSN: [id number] Arrival date & time: 01/21/17  1432     History   Chief Complaint Chief Complaint  Patient presents with  . Abscess    HPI Nicholas King is a 46 y.o. male who presents to ED for evaluation of 3-day history of painful, erythematous and swollen area to right inner forearm.  States that the area has gradually increased in size since the symptoms began.  He has been taking his home oxycodone with mild relief in his symptoms.  He tried using warm compresses at home with no relief in the size.  He denies any fevers, chills, shortness of breath, IV drug use, vomiting.  He does have a history of similar symptoms in the past which usually resolve on their own.  He has been seen here in the ED approximately 6 months ago for similar symptoms but he states this was in another area of his arm.  The area was incised and drained at that time.  HPI  Past Medical History:  Diagnosis Date  . Bone pain 12/26/2012  . Cancer ([place])   . Chronic back pain   . Chronic neck pain    Hx C7 fx after fall 01/2014  . Compression fracture 01/2014   L1  . 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 Dysphagia, unspecified(787.20) 05/16/2012  . GERD (gastroesophageal reflux disease)   . Lumbago 01/06/2013  . Lymphoma ([place])   . Neuropathic pain 11/06/2012  . Neuropathy 12/26/2012  . Pain management   . Pinched nerve in neck   . Pneumonia    April 2014  . 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 Radiation 10/30/12-11/25/12   Bilateral Neck/Tonsil  . Radiation 10/30/12-11/25/12   Bilateral neck and tonsils    Patient Active Problem List   Diagnosis Date Noted  . Cellulitis 10/14/2016  . Hyponatremia 10/14/2016  . Skin ulcers of both feet ([place])   . Acquired hypothyroidism 10/12/2016  . Iron deficiency anemia 10/12/2016  . Bug bites 10/12/2016  . Chronic midline back pain 10/13/2015  . Anemia in chronic illness 01/20/2015  . Underweight  12/30/2014  . CAP (community acquired pneumonia) 12/28/2014  . Encounter for central line placement   . Malnutrition of moderate degree ([place]) 08/03/2014  . Fever chills 07/31/2014  . Sepsis ([place]) 07/31/2014  . Leukopenia 07/20/2014  . Thrombocytopenia ([place]) 07/20/2014  . Tobacco abuse 07/20/2014  . Compression fracture 01/19/2014  . Dysphagia 07/20/2013  . Poor dentition 07/20/2013  . Meralgia paraesthetica 01/06/2013  . Lumbago 01/06/2013  . Neuropathy ([place]) 12/26/2012  . Bone pain 12/26/2012  . Neuropathic pain 11/06/2012  . Cachexia ([place]) 05/16/2012  . History of B-cell lymphoma 05/16/2012  . Dysphagia, unspecified(787.20) 05/16/2012  . GERD (gastroesophageal reflux disease) 05/16/2012  . Thyroid nodule 05/16/2012  . Cigarette smoker 05/16/2012    Past Surgical History:  Procedure Laterality Date  . DIRECT LARYNGOSCOPY Left 05/08/2012   Procedure: DIRECT LARYNGOSCOPY with biopsy left tonsil;  Surgeon: Melissa Montane, MD;  Location: [place];  Service: ENT;  Laterality: Left;  . NO PAST SURGERIES    . right eyebrown bone     Right frontal skull fracture       Home Medications    Prior to Admission medications   Medication Sig Start Date End Date Taking? Authorizing Provider  doxycycline (VIBRAMYCIN) 100 MG capsule Take 1 capsule (100 mg total) by mouth 2 (two) times daily. 10/15/16   Isaac Bliss, Rayford Halsted,  MD  feeding supplement, ENSURE ENLIVE, (ENSURE ENLIVE) LIQD Take 237 mLs by mouth 3 (three) times daily between meals. 08/05/14   Mikhail, Velta Addison, DO  gabapentin (NEURONTIN) 300 MG capsule Take 600 mg by mouth 3 (three) times daily.    [provider]  levETIRAcetam (KEPPRA) 250 MG tablet Take 250 mg by mouth 2 (two) times daily. 11/29/14   [provider]  levothyroxine (SYNTHROID, LEVOTHROID) 25 MCG tablet Take 25 mcg by mouth daily before breakfast.    [provider]  oxyCODONE (ROXICODONE) 15 MG immediate release tablet Take 15 mg by mouth  every 6 (six) hours as needed for pain.  09/27/15   [provider]  sulfamethoxazole-trimethoprim (BACTRIM DS,SEPTRA DS) 800-160 MG tablet Take 1 tablet by mouth 2 (two) times daily for 7 days. 01/21/17 01/28/17  Delia Heady, PA-C    Family History Family History  Problem Relation Age of Onset  . Cancer Father        lung  . Diabetes Father   . Cancer Maternal Aunt        gyn cancer, pancreas  . Cancer Paternal Aunt        pancreas cancer  . Cancer Paternal Uncle        unk  . Cancer Sister     Social History Social History   Tobacco Use  . Smoking status: Current Every Day Smoker    Packs/day: 0.50    Years: 25.00    Pack years: 12.50    Types: Cigarettes  . Smokeless tobacco: Never Used  Substance Use Topics  . Alcohol use: No  . Drug use: No     Allergies   Vancomycin; Cymbalta [duloxetine hcl]; Penicillins; Amoxicillin; and Hydrocodone   Review of Systems Review of Systems  Constitutional: Negative for chills and fever.  Gastrointestinal: Negative for nausea and vomiting.  Musculoskeletal: Negative for arthralgias, joint swelling and myalgias.  Skin: Positive for color change and rash.  Neurological: Negative for weakness and numbness.     Physical Exam Updated Vital Signs BP 131/90 (BP Location: Right Arm)   Pulse 69   Temp 98.4 F (36.9 C) (Oral)   Resp 14   Ht 5\' 7"  (1.702 m)   Wt 49.9 kg (110 lb)   SpO2 99%   BMI 17.23 kg/m   Physical Exam  Constitutional: He appears well-developed and well-nourished. No distress.  HENT:  Head: Normocephalic and atraumatic.  Eyes: Conjunctivae and EOM are normal. No scleral icterus.  Neck: Normal range of motion.  Pulmonary/Chest: Effort normal. No respiratory distress.  Neurological: He is alert.  Skin: No rash noted. He is not diaphoretic. There is erythema.  Approximately 2 cm area of fluctuance and erythema noted on the right inner forearm near the [facility].  No streaking, blistering noted.    Psychiatric: He has a normal mood and affect.  Nursing note and vitals reviewed.    ED Treatments / Results  Labs (all labs ordered are listed, but only abnormal results are displayed) Labs Reviewed  AEROBIC CULTURE (SUPERFICIAL SPECIMEN)    EKG  EKG Interpretation None       Radiology No results found.  Procedures .Marland KitchenIncision and Drainage Date/Time: 01/21/2017 3:31 PM Performed by: Delia Heady, PA-C Authorized by: Delia Heady, PA-C   Consent:    Consent obtained:  Verbal   Consent given by:  Patient   Risks discussed:  Bleeding, incomplete drainage and pain Location:    Type:  Abscess   Location:  Upper extremity  Upper extremity location:  Arm   Arm location:  R lower arm Pre-procedure details:    Skin preparation:  Betadine Procedure details:    Incision types:  Stab incision   Scalpel blade:  11   Wound management:  Irrigated with saline and probed and deloculated   Drainage:  Purulent and bloody   Drainage amount:  Copious   Packing materials:  1/4 in iodoform gauze Post-procedure details:    Patient tolerance of procedure:  Tolerated well, no immediate complications     EMERGENCY DEPARTMENT US SOFT TISSUE INTERPRETATION "Study: Limited Soft Tissue Ultrasound"  INDICATIONS: Soft tissue infection Multiple views of the body part were obtained in real-time with a multi-frequency linear probe  PERFORMED BY: Myself IMAGES ARCHIVED?: No SIDE:Right  BODY PART:Upper extremity INTERPRETATION:  Abcess present    Medications Ordered in ED Medications  lidocaine-EPINEPHrine (XYLOCAINE W/EPI) 2 %-1:200000 (PF) injection 10 mL (10 mLs Infiltration Given by Other 01/21/17 1535)  oxyCODONE-acetaminophen (PERCOCET/ROXICET) 5-325 MG per tablet 1 tablet (1 tablet Oral Given 01/21/17 1535)     Initial Impression / Assessment and Plan / ED Course  I have reviewed the triage vital signs and the nursing notes.  Pertinent labs & imaging results that were  available during my care of the patient were reviewed by me and considered in my medical decision making (see chart for details).     Patient presents to ED for evaluation of abscess to right upper forearm.  History of similar symptoms in the past.  He was actually seen here 6 months ago and had a similar area incised and drained.  No fevers and he is afebrile here.  Area was incised and drained with copious amount of purulent drainage noted.  He remains neurovascularly intact.  Area was packed with iodoform packing.  Wound culture was obtained.  Will place on antibiotics and advised patient to follow-up with results of wound culture when available.  Advised to return appropriate time for packing removal.  Patient appears stable for discharge at this time.  Strict return precautions given.  Portions of this note were generated with Lobbyist. Dictation errors may occur despite best attempts at proofreading.   Final Clinical Impressions(s) / ED Diagnoses   Final diagnoses:  Abscess of right upper extremity    ED Discharge Orders        Ordered    sulfamethoxazole-trimethoprim (BACTRIM DS,SEPTRA DS) 800-160 MG tablet  2 times daily     01/21/17 [place], Viral Schramm, PA-C 01/21/17 1610    Forde Dandy, MD 01/22/17 1432

## 2017-01-24 ENCOUNTER — Encounter (HOSPITAL_COMMUNITY): Payer: Self-pay | Admitting: Emergency Medicine

## 2017-01-24 ENCOUNTER — Emergency Department (HOSPITAL_COMMUNITY)
Admission: EM | Admit: 2017-01-24 | Discharge: 2017-01-24 | Disposition: A | Discharge status: Home / Self Care | Payer: Medicare Other | Attending: Emergency Medicine | Admitting: Emergency Medicine

## 2017-01-24 DIAGNOSIS — F17210 Nicotine dependence, cigarettes, uncomplicated: Secondary | ICD-10-CM | POA: Insufficient documentation

## 2017-01-24 DIAGNOSIS — Z4800 Encounter for change or removal of nonsurgical wound dressing: Secondary | ICD-10-CM | POA: Diagnosis not present

## 2017-01-24 DIAGNOSIS — Z5189 Encounter for other specified aftercare: Secondary | ICD-10-CM | POA: Diagnosis not present

## 2017-01-24 DIAGNOSIS — Z79899 Other long term (current) drug therapy: Secondary | ICD-10-CM | POA: Diagnosis not present

## 2017-01-24 DIAGNOSIS — Z8572 Personal history of non-Hodgkin lymphomas: Secondary | ICD-10-CM | POA: Diagnosis not present

## 2017-01-24 DIAGNOSIS — L02413 Cutaneous abscess of right upper limb: Secondary | ICD-10-CM | POA: Insufficient documentation

## 2017-01-24 LAB — AEROBIC CULTURE  (SUPERFICIAL SPECIMEN)

## 2017-01-24 LAB — AEROBIC CULTURE W GRAM STAIN (SUPERFICIAL SPECIMEN)

## 2017-01-24 MED ORDER — LIDOCAINE-EPINEPHRINE (PF) 1 %-1:200000 IJ SOLN
20.0000 mL | Freq: Once | INTRAMUSCULAR | Status: AC
  Administered 2017-01-24: 20 mL
  Filled 2017-01-24: qty 30

## 2017-01-24 NOTE — ED Triage Notes (Signed)
Patient states he is here to have the packing removed from right arm abscess.

## 2017-01-24 NOTE — ED Notes (Signed)
Provider came to room to perform treatment.  Pt not in room.

## 2017-01-24 NOTE — ED Provider Notes (Signed)
[facility] EMERGENCY DEPARTMENT Provider Note   CSN: [id number] Arrival date & time: 01/24/17  1414     History   Chief Complaint Chief Complaint  Patient presents with  . Wound Check    HPI Nicholas King is a 46 y.o. male.  HPI   Patient is a 46 year old male with a history of lymphoma, chronic back pain and chronic neck pain presenting for a wound check after I&D of an abscess on his right forearm that was performed on 01-22-2017.  Packing was placed at that time.  Subsequently, patient has performed 1 warm compress per day.  Patient reports he has been taking the Bactrim per the prescription.  Patient has not noted any increased drainage or surrounding erythema.  Patient reports wound site does appear to scab over.  No numbness in the distal right upper extremity.  Past Medical History:  Diagnosis Date  . Bone pain 12/26/2012  . Cancer ([place])   . Chronic back pain   . Chronic neck pain    Hx C7 fx after fall 01/2014  . Compression fracture 01/2014   L1  . 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 Dysphagia, unspecified(787.20) 05/16/2012  . GERD (gastroesophageal reflux disease)   . Lumbago 01/06/2013  . Lymphoma ([place])   . Neuropathic pain 11/06/2012  . Neuropathy 12/26/2012  . Pain management   . Pinched nerve in neck   . Pneumonia    April 2014  . 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 Radiation 10/30/12-11/25/12   Bilateral Neck/Tonsil  . Radiation 10/30/12-11/25/12   Bilateral neck and tonsils    Patient Active Problem List   Diagnosis Date Noted  . Cellulitis 10/14/2016  . Hyponatremia 10/14/2016  . Skin ulcers of both feet ([place])   . Acquired hypothyroidism 10/12/2016  . Iron deficiency anemia 10/12/2016  . Bug bites 10/12/2016  . Chronic midline back pain 10/13/2015  . Anemia in chronic illness 01/20/2015  . Underweight 12/30/2014  . CAP (community acquired pneumonia) 12/28/2014  . Encounter for central line placement   . Malnutrition of  moderate degree ([place]) 08/03/2014  . Fever chills 07/31/2014  . Sepsis ([place]) 07/31/2014  . Leukopenia 07/20/2014  . Thrombocytopenia ([place]) 07/20/2014  . Tobacco abuse 07/20/2014  . Compression fracture 01/19/2014  . Dysphagia 07/20/2013  . Poor dentition 07/20/2013  . Meralgia paraesthetica 01/06/2013  . Lumbago 01/06/2013  . Neuropathy ([place]) 12/26/2012  . Bone pain 12/26/2012  . Neuropathic pain 11/06/2012  . Cachexia ([place]) 05/16/2012  . History of B-cell lymphoma 05/16/2012  . Dysphagia, unspecified(787.20) 05/16/2012  . GERD (gastroesophageal reflux disease) 05/16/2012  . Thyroid nodule 05/16/2012  . Cigarette smoker 05/16/2012    Past Surgical History:  Procedure Laterality Date  . DIRECT LARYNGOSCOPY Left 05/08/2012   Procedure: DIRECT LARYNGOSCOPY with biopsy left tonsil;  Surgeon: Melissa Montane, MD;  Location: [place];  Service: ENT;  Laterality: Left;  . NO PAST SURGERIES    . right eyebrown bone     Right frontal skull fracture       Home Medications    Prior to Admission medications   Medication Sig Start Date End Date Taking? Authorizing Provider  doxycycline (VIBRAMYCIN) 100 MG capsule Take 1 capsule (100 mg total) by mouth 2 (two) times daily. 10/15/16   Isaac Bliss, Rayford Halsted, MD  feeding supplement, ENSURE ENLIVE, (ENSURE ENLIVE) LIQD Take 237 mLs by mouth 3 (three) times daily between meals. 08/05/14   Mikhail, Velta Addison, DO  gabapentin (NEURONTIN) 300 MG  capsule Take 600 mg by mouth 3 (three) times daily.    [provider]  levETIRAcetam (KEPPRA) 250 MG tablet Take 250 mg by mouth 2 (two) times daily. 11/29/14   [provider]  levothyroxine (SYNTHROID, LEVOTHROID) 25 MCG tablet Take 25 mcg by mouth daily before breakfast.    [provider]  oxyCODONE (ROXICODONE) 15 MG immediate release tablet Take 15 mg by mouth every 6 (six) hours as needed for pain.  09/27/15   [provider]  sulfamethoxazole-trimethoprim (BACTRIM  DS,SEPTRA DS) 800-160 MG tablet Take 1 tablet by mouth 2 (two) times daily for 7 days. 01/21/17 01/28/17  Delia Heady, PA-C    Family History Family History  Problem Relation Age of Onset  . Cancer Father        lung  . Diabetes Father   . Cancer Maternal Aunt        gyn cancer, pancreas  . Cancer Paternal Aunt        pancreas cancer  . Cancer Paternal Uncle        unk  . Cancer Sister     Social History Social History   Tobacco Use  . Smoking status: Current Every Day Smoker    Packs/day: 0.50    Years: 25.00    Pack years: 12.50    Types: Cigarettes  . Smokeless tobacco: Never Used  Substance Use Topics  . Alcohol use: No  . Drug use: No     Allergies   Vancomycin; Cymbalta [duloxetine hcl]; Penicillins; Amoxicillin; and Hydrocodone   Review of Systems Review of Systems  Skin: Positive for wound. Negative for color change.  Neurological: Negative for weakness and numbness.     Physical Exam Updated Vital Signs BP (!) 121/93 (BP Location: Right Arm)   Pulse 87   Temp 97.7 F (36.5 C) (Oral)   Resp 16   Wt 49.9 kg (110 lb)   SpO2 100%   BMI 17.23 kg/m   Physical Exam  Constitutional: He appears well-developed and well-nourished. No distress.  Sitting comfortably in bed.  HENT:  Head: Normocephalic and atraumatic.  Eyes: Conjunctivae are normal. Right eye exhibits no discharge. Left eye exhibits no discharge.  EOMs normal to gross examination.  Neck: Normal range of motion.  Cardiovascular: Normal rate and regular rhythm.  Intact, 2+ radial and ulnar pulses of RUE.   Pulmonary/Chest:  Normal respiratory effort. Patient converses comfortably. No audible wheeze or stridor.  Abdominal: He exhibits no distension.  Musculoskeletal: Normal range of motion.  Neurological: He is alert.  Cranial nerves intact to gross observation. Patient moves extremities without difficulty. Sensation intact to light touch in the distal right upper extremity in the  distributions of median, ulnar, and radial nerves.  Skin: Skin is warm and dry. He is not diaphoretic.     There is an elevated area of skin in the right forearm just distal to the [facility] fossa with extruding packing.  It appears to be scabbed over with dried blood.  There is no surrounding erythema.  No active drainage.  Psychiatric: He has a normal mood and affect. His behavior is normal. Judgment and thought content normal.  Nursing note and vitals reviewed.    ED Treatments / Results  Labs (all labs ordered are listed, but only abnormal results are displayed) Labs Reviewed - No data to display  EKG  EKG Interpretation None       Radiology No results found.  Procedures Procedures (including critical care time)  Medications Ordered in ED Medications  lidocaine-EPINEPHrine (XYLOCAINE-EPINEPHrine) 1 %-1:200000 (PF) injection 20 mL (20 mLs Infiltration Given 01/24/17 1551)     Initial Impression / Assessment and Plan / ED Course  I have reviewed the triage vital signs and the nursing notes.  Pertinent labs & imaging results that were available during my care of the patient were reviewed by me and considered in my medical decision making (see chart for details).     Final Clinical Impressions(s) / ED Diagnoses   Final diagnoses:  Wound check, abscess   Patient is nontoxic-appearing and in no acute distress.  Patient is neurovascularly intact in the right upper extremity.  Packing was removed.  Appears to be no development of surrounding cellulitis.  Patient has only been performing warm compresses once a day.  I feel that this has inhibited some drainage.  I encouraged patient to do this more frequently.  I do not feel that the wound warrants additional packing, however I discussed additional marsupialization of the abscess cavity.  Patient eloped off the floor before this could be performed.  I did discuss the importance with patient of finishing the course of antibiotics as  well as performing more frequent warm compresses to promote drainage.  Patient was understanding and agreement with the plan of care.  ED Discharge Orders    None       Nicholas King, Nicholas King 01/24/17 1654    Julianne Rice, MD 01/24/17 1949

## 2017-01-25 ENCOUNTER — Telehealth: Payer: Self-pay | Admitting: *Deleted

## 2017-01-25 NOTE — Telephone Encounter (Signed)
Post ED Visit - Positive Culture Follow-up  Culture report reviewed by antimicrobial stewardship pharmacist:  [x]  Elenor Quinones, Pharm.D. []  Heide Guile, Pharm.D., BCPS AQ-ID []  Parks Neptune, Pharm.D., BCPS []  Alycia Rossetti, Pharm.D., BCPS []  [place], [place].D., BCPS, AAHIVP []  Legrand Como, Pharm.D., BCPS, AAHIVP []  Salome Arnt, PharmD, BCPS []  Dimitri Ped, PharmD, BCPS []  Vincenza Hews, PharmD, BCPS  Positive wound culture Treated with Sulfamethoxazole-Trimethoprim, organism sensitive to the same and no further patient follow-up is required at this time.  Harlon Flor [facility] 01/25/2017, 10:18 AM

## 2017-01-31 DIAGNOSIS — M542 Cervicalgia: Secondary | ICD-10-CM | POA: Diagnosis not present

## 2017-01-31 DIAGNOSIS — Z79891 Long term (current) use of opiate analgesic: Secondary | ICD-10-CM | POA: Diagnosis not present

## 2017-01-31 DIAGNOSIS — M5416 Radiculopathy, lumbar region: Secondary | ICD-10-CM | POA: Diagnosis not present

## 2017-01-31 DIAGNOSIS — Z79899 Other long term (current) drug therapy: Secondary | ICD-10-CM | POA: Diagnosis not present

## 2017-01-31 DIAGNOSIS — Z5181 Encounter for therapeutic drug level monitoring: Secondary | ICD-10-CM | POA: Diagnosis not present

## 2017-01-31 DIAGNOSIS — R030 Elevated blood-pressure reading, without diagnosis of hypertension: Secondary | ICD-10-CM | POA: Diagnosis not present

## 2017-01-31 DIAGNOSIS — G620 Drug-induced polyneuropathy: Secondary | ICD-10-CM | POA: Diagnosis not present

## 2017-01-31 DIAGNOSIS — Z681 Body mass index (BMI) 19 or less, adult: Secondary | ICD-10-CM | POA: Diagnosis not present

## 2017-04-30 DIAGNOSIS — D510 Vitamin B12 deficiency anemia due to intrinsic factor deficiency: Secondary | ICD-10-CM | POA: Diagnosis not present

## 2017-04-30 DIAGNOSIS — E559 Vitamin D deficiency, unspecified: Secondary | ICD-10-CM | POA: Diagnosis not present

## 2017-04-30 DIAGNOSIS — I110 Hypertensive heart disease with heart failure: Secondary | ICD-10-CM | POA: Diagnosis not present

## 2017-04-30 DIAGNOSIS — R5383 Other fatigue: Secondary | ICD-10-CM | POA: Diagnosis not present

## 2017-04-30 DIAGNOSIS — E7849 Other hyperlipidemia: Secondary | ICD-10-CM | POA: Diagnosis not present
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
## 2017-10-10 ENCOUNTER — Inpatient Hospital Stay: Payer: Medicare Other

## 2017-10-10 ENCOUNTER — Encounter: Payer: Self-pay | Admitting: Hematology and Oncology

## 2017-10-10 ENCOUNTER — Other Ambulatory Visit: Payer: Self-pay | Admitting: Hematology and Oncology

## 2017-10-10 ENCOUNTER — Inpatient Hospital Stay: Payer: Medicare Other | Attending: Hematology and Oncology | Admitting: Hematology and Oncology

## 2017-10-10 DIAGNOSIS — E039 Hypothyroidism, unspecified: Secondary | ICD-10-CM

## 2017-10-10 DIAGNOSIS — Z8572 Personal history of non-Hodgkin lymphomas: Secondary | ICD-10-CM

## 2017-11-22 ENCOUNTER — Telehealth: Payer: Self-pay

## 2017-11-22 NOTE — Telephone Encounter (Signed)
Per 8/18 phone msg return. Scheduled patient and spoke with his mother concerning appointment time. Also asked if patient can not attend this appointment to please call and we will r/s for another day

## 2017-11-26 ENCOUNTER — Inpatient Hospital Stay: Payer: Medicare Other | Attending: Hematology and Oncology

## 2017-11-26 ENCOUNTER — Encounter: Payer: Self-pay | Admitting: Hematology and Oncology

## 2017-11-26 ENCOUNTER — Telehealth: Payer: Self-pay

## 2017-11-26 ENCOUNTER — Inpatient Hospital Stay (HOSPITAL_BASED_OUTPATIENT_CLINIC_OR_DEPARTMENT_OTHER): Payer: Medicare Other | Admitting: Hematology and Oncology

## 2017-11-26 VITALS — BP 100/77 | HR 92 | Temp 98.1°F | Resp 18 | Ht 67.0 in | Wt 114.6 lb

## 2017-11-26 DIAGNOSIS — R1310 Dysphagia, unspecified: Secondary | ICD-10-CM

## 2017-11-26 DIAGNOSIS — Z1159 Encounter for screening for other viral diseases: Secondary | ICD-10-CM

## 2017-11-26 DIAGNOSIS — Z923 Personal history of irradiation: Secondary | ICD-10-CM

## 2017-11-26 DIAGNOSIS — Y842 Radiological procedure and radiotherapy as the cause of abnormal reaction of the patient, or of later complication, without mention of misadventure at the time of the procedure: Secondary | ICD-10-CM | POA: Diagnosis not present

## 2017-11-26 DIAGNOSIS — M549 Dorsalgia, unspecified: Secondary | ICD-10-CM

## 2017-11-26 DIAGNOSIS — F17210 Nicotine dependence, cigarettes, uncomplicated: Secondary | ICD-10-CM

## 2017-11-26 DIAGNOSIS — E039 Hypothyroidism, unspecified: Secondary | ICD-10-CM

## 2017-11-26 DIAGNOSIS — Z79899 Other long term (current) drug therapy: Secondary | ICD-10-CM | POA: Insufficient documentation

## 2017-11-26 DIAGNOSIS — Z0000 Encounter for general adult medical examination without abnormal findings: Secondary | ICD-10-CM

## 2017-11-26 DIAGNOSIS — G8929 Other chronic pain: Secondary | ICD-10-CM | POA: Diagnosis not present

## 2017-11-26 DIAGNOSIS — Z8572 Personal history of non-Hodgkin lymphomas: Secondary | ICD-10-CM

## 2017-11-26 DIAGNOSIS — Z23 Encounter for immunization: Secondary | ICD-10-CM | POA: Insufficient documentation

## 2017-11-26 DIAGNOSIS — M545 Low back pain: Secondary | ICD-10-CM

## 2017-11-26 LAB — TSH: TSH: 2.163 u[IU]/mL (ref 0.320–4.118)

## 2017-11-26 LAB — COMPREHENSIVE METABOLIC PANEL
ALBUMIN: 3.6 g/dL (ref 3.5–5.0)
ALT: 15 U/L (ref 0–44)
ANION GAP: 13 (ref 5–15)
AST: 25 U/L (ref 15–41)
Alkaline Phosphatase: 103 U/L (ref 38–126)
BUN: 6 mg/dL (ref 6–20)
CHLORIDE: 103 mmol/L (ref 98–111)
CO2: 26 mmol/L (ref 22–32)
Calcium: 9.7 mg/dL (ref 8.9–10.3)
Creatinine, Ser: 0.9 mg/dL (ref 0.61–1.24)
Glucose, Bld: 114 mg/dL — ABNORMAL HIGH (ref 70–99)
POTASSIUM: 4.6 mmol/L (ref 3.5–5.1)
SODIUM: 142 mmol/L (ref 135–145)
Total Bilirubin: 1 mg/dL (ref 0.3–1.2)
Total Protein: 7.9 g/dL (ref 6.5–8.1)

## 2017-11-26 LAB — CBC WITH DIFFERENTIAL/PLATELET
Abs Immature Granulocytes: 0.02 10*3/uL (ref 0.00–0.07)
Basophils Absolute: 0 10*3/uL (ref 0.0–0.1)
Basophils Relative: 0 %
EOS ABS: 0 10*3/uL (ref 0.0–0.5)
EOS PCT: 0 %
HEMATOCRIT: 44.6 % (ref 39.0–52.0)
HEMOGLOBIN: 13.5 g/dL (ref 13.0–17.0)
IMMATURE GRANULOCYTES: 0 %
LYMPHS ABS: 1.5 10*3/uL (ref 0.7–4.0)
LYMPHS PCT: 34 %
MCH: 24.3 pg — ABNORMAL LOW (ref 26.0–34.0)
MCHC: 30.3 g/dL (ref 30.0–36.0)
MCV: 80.4 fL (ref 80.0–100.0)
Monocytes Absolute: 0.3 10*3/uL (ref 0.1–1.0)
Monocytes Relative: 8 %
NEUTROS PCT: 58 %
Neutro Abs: 2.6 10*3/uL (ref 1.7–7.7)
Platelets: 280 10*3/uL (ref 150–400)
RBC: 5.55 MIL/uL (ref 4.22–5.81)
RDW: 17.1 % — AB (ref 11.5–15.5)
WBC: 4.5 10*3/uL (ref 4.0–10.5)
nRBC: 0 % (ref 0.0–0.2)

## 2017-11-26 LAB — LACTATE DEHYDROGENASE: LDH: 189 U/L (ref 98–192)

## 2017-11-26 MED ORDER — INFLUENZA VAC SPLIT QUAD 0.5 ML IM SUSY
PREFILLED_SYRINGE | INTRAMUSCULAR | Status: AC
  Filled 2017-11-26: qty 0.5

## 2017-11-26 MED ORDER — INFLUENZA VAC SPLIT QUAD 0.5 ML IM SUSY
0.5000 mL | PREFILLED_SYRINGE | Freq: Once | INTRAMUSCULAR | Status: AC
  Administered 2017-11-26: 0.5 mL via INTRAMUSCULAR

## 2017-11-26 NOTE — Assessment & Plan Note (Signed)
I will order screening for hepatitis C per patient request

## 2017-11-26 NOTE — Assessment & Plan Note (Signed)
He has chronic dysphagia since prior radiation treatment He would like to be referred back to ENT for evaluation and possible esophageal dilation I will refer him back to see ENT surgeon for further evaluation and management

## 2017-11-26 NOTE — Progress Notes (Signed)
[place] OFFICE PROGRESS NOTE  Patient Care Team: Rosita Fire, MD as PCP - General (Internal Medicine) Melissa Montane, MD as Attending Physician (Otolaryngology) Michel Bickers, MD (Infectious Diseases) Leota Sauers, RN as Registered Nurse (Oncology) Heath Lark, MD as Consulting Physician (Hematology and Oncology) Glenna Fellows, MD as Consulting Physician (Neurosurgery)  ASSESSMENT & PLAN:  History of B-cell lymphoma Clinically, he have no signs of cancer recurrence He is currently more than 5 years out from his treatment I will discharge him from the oncology clinic and recommend follow-up with primary care doctor only.  Cigarette smoker I spent some time counseling the patient the importance of tobacco cessation. he is currently attempting to quit on his own We discussed the importance of preventive care and reviewed the vaccination programs. He does not have any prior allergic reactions to influenza vaccination. He agrees to proceed with influenza vaccination today and we will administer it today at the clinic.   Dysphagia He has chronic dysphagia since prior radiation treatment He would like to be referred back to ENT for evaluation and possible esophageal dilation I will refer him back to see ENT surgeon for further evaluation and management  Chronic midline back pain The patient has chronic back pain He was discharged from neurosurgeon and spine office due to history of compliance issue He would like to be referred to pain specialty clinic and I will try to refer him.  Need for hepatitis C screening test I will order screening for hepatitis C per patient request   Orders Placed This Encounter  Procedures  . Hepatitis C antibody (reflex if positive)    Standing Status:   Future    Standing Expiration Date:   11/26/2018  . Ambulatory referral to ENT    Referral Priority:   Routine    Referral Type:   Consultation    Referral Reason:   Specialty  Services Required    Referred to Provider:   Melissa Montane, MD    Requested Specialty:   Otolaryngology    Number of Visits Requested:   1  . Ambulatory referral to Pain Clinic    Referral Priority:   Routine    Referral Type:   Consultation    Referral Reason:   Specialty Services Required    Requested Specialty:   Pain Medicine    Number of Visits Requested:   1    INTERVAL HISTORY: Please see below for problem oriented charting. He returns for further follow-up for lymphoma He denies new lymphadenopathy The patient has history of cellulitis, resolved with antibiotic treatment He continues to smoke periodically He denies alcohol intake He was discharged from neurosurgery and spine service due to noncompliance with his prescription pain medicine. He continues to have chronic back pain He also complained of chronic dysphagia  SUMMARY OF ONCOLOGIC HISTORY:   History of B-cell lymphoma   05/16/2012 Initial Diagnosis    History of B-cell lymphoma    01/19/2015 Imaging    CT scan showed no evidence of disease. Mild splenomegaly is noted. Resolving pneumonia is noted     REVIEW OF SYSTEMS:   Constitutional: Denies fevers, chills or abnormal weight loss Eyes: Denies blurriness of vision Ears, nose, mouth, throat, and face: Denies mucositis or sore throat Respiratory: Denies cough, dyspnea or wheezes Cardiovascular: Denies palpitation, chest discomfort or lower extremity swelling Gastrointestinal:  Denies nausea, heartburn or change in bowel habits Skin: Denies abnormal skin rashes Lymphatics: Denies new lymphadenopathy or easy bruising Neurological:Denies numbness, tingling  or new weaknesses Behavioral/Psych: Mood is stable, no new changes  All other systems were reviewed with the patient and are negative.  I have reviewed the past medical history, past surgical history, social history and family history with the patient and they are unchanged from previous note.  ALLERGIES:   is allergic to vancomycin; cymbalta [duloxetine hcl]; penicillins; amoxicillin; and hydrocodone.  MEDICATIONS:  Current Outpatient Medications  Medication Sig Dispense Refill  . feeding supplement, ENSURE ENLIVE, (ENSURE ENLIVE) LIQD Take 237 mLs by mouth 3 (three) times daily between meals. 237 mL 12  . gabapentin (NEURONTIN) 300 MG capsule Take 600 mg by mouth 3 (three) times daily.    Marland Kitchen levothyroxine (SYNTHROID, LEVOTHROID) 25 MCG tablet Take 25 mcg by mouth daily before breakfast.     No current facility-administered medications for this visit.     PHYSICAL EXAMINATION: ECOG PERFORMANCE STATUS: 1 - Symptomatic but completely ambulatory  Vitals:   11/26/17 0829  BP: 100/77  Pulse: 92  Resp: 18  Temp: 98.1 F (36.7 C)  SpO2: 100%   Filed Weights   11/26/17 0829  Weight: 114 lb 9.6 oz (52 kg)    GENERAL:alert, no distress and comfortable SKIN: skin color, texture, turgor are normal, no rashes or significant lesions EYES: normal, Conjunctiva are pink and non-injected, sclera clear OROPHARYNX:no exudate, no erythema and lips, buccal mucosa, and tongue normal  NECK: supple, thyroid normal size, non-tender, without nodularity LYMPH:  no palpable lymphadenopathy in the cervical, axillary or inguinal LUNGS: clear to auscultation and percussion with normal breathing effort HEART: regular rate & rhythm and no murmurs and no lower extremity edema ABDOMEN:abdomen soft, non-tender and normal bowel sounds Musculoskeletal:no cyanosis of digits and no clubbing  NEURO: alert & oriented x 3 with fluent speech, no focal motor/sensory deficits  LABORATORY DATA:  I have reviewed the data as listed    Component Value Date/Time   NA 142 11/26/2017 0755   NA 128 (L) 10/12/2016 1303   K 4.6 11/26/2017 0755   K 4.3 10/12/2016 1303   CL PENDING 11/26/2017 0755   CL 107 07/24/2012 0819   CO2 26 11/26/2017 0755   CO2 25 10/12/2016 1303   GLUCOSE 114 (H) 11/26/2017 0755   GLUCOSE 118  10/12/2016 1303   GLUCOSE 83 07/24/2012 0819   BUN 6 11/26/2017 0755   BUN 8.0 10/12/2016 1303   CREATININE 0.90 11/26/2017 0755   CREATININE 0.9 10/12/2016 1303   CALCIUM 9.7 11/26/2017 0755   CALCIUM 8.2 (L) 10/12/2016 1303   PROT 7.9 11/26/2017 0755   PROT 6.3 (L) 10/12/2016 1303   ALBUMIN 3.6 11/26/2017 0755   ALBUMIN 2.7 (L) 10/12/2016 1303   AST 25 11/26/2017 0755   AST 31 10/12/2016 1303   ALT 15 11/26/2017 0755   ALT 16 10/12/2016 1303   ALKPHOS 103 11/26/2017 0755   ALKPHOS 97 10/12/2016 1303   BILITOT 1.0 11/26/2017 0755   BILITOT 1.11 10/12/2016 1303   GFRNONAA >60 11/26/2017 0755   GFRAA >60 11/26/2017 0755    No results found for: SPEP, UPEP  Lab Results  Component Value Date   WBC 4.5 11/26/2017   NEUTROABS 2.6 11/26/2017   HGB 13.5 11/26/2017   HCT 44.6 11/26/2017   MCV 80.4 11/26/2017   PLT 280 11/26/2017      Chemistry      Component Value Date/Time   NA 142 11/26/2017 0755   NA 128 (L) 10/12/2016 1303   K 4.6 11/26/2017 0755   K 4.3  10/12/2016 1303   CL PENDING 11/26/2017 0755   CL 107 07/24/2012 0819   CO2 26 11/26/2017 0755   CO2 25 10/12/2016 1303   BUN 6 11/26/2017 0755   BUN 8.0 10/12/2016 1303   CREATININE 0.90 11/26/2017 0755   CREATININE 0.9 10/12/2016 1303      Component Value Date/Time   CALCIUM 9.7 11/26/2017 0755   CALCIUM 8.2 (L) 10/12/2016 1303   ALKPHOS 103 11/26/2017 0755   ALKPHOS 97 10/12/2016 1303   AST 25 11/26/2017 0755   AST 31 10/12/2016 1303   ALT 15 11/26/2017 0755   ALT 16 10/12/2016 1303   BILITOT 1.0 11/26/2017 0755   BILITOT 1.11 10/12/2016 1303      All questions were answered. The patient knows to call the clinic with any problems, questions or concerns. No barriers to learning was detected.  I spent 15 minutes counseling the patient face to face. The total time spent in the appointment was 20 minutes and more than 50% was on counseling and review of test results  Heath Lark, MD 11/26/2017 9:14  AM

## 2017-11-26 NOTE — Assessment & Plan Note (Signed)
The patient has chronic back pain He was discharged from neurosurgeon and spine office due to history of compliance issue He would like to be referred to pain specialty clinic and I will try to refer him.

## 2017-11-26 NOTE — Telephone Encounter (Signed)
Referral called and faxed to Dr. Janace Hoard office at [phone].

## 2017-11-26 NOTE — Assessment & Plan Note (Signed)
Clinically, he have no signs of cancer recurrence He is currently more than 5 years out from his treatment I will discharge him from the oncology clinic and recommend follow-up with primary care doctor only.

## 2017-11-26 NOTE — Assessment & Plan Note (Signed)
I spent some time counseling the patient the importance of tobacco cessation. he is currently attempting to quit on his own We discussed the importance of preventive care and reviewed the vaccination programs. He does not have any prior allergic reactions to influenza vaccination. He agrees to proceed with influenza vaccination today and we will administer it today at the clinic.

## 2017-11-27 ENCOUNTER — Telehealth: Payer: Self-pay | Admitting: *Deleted

## 2017-11-27 ENCOUNTER — Telehealth: Payer: Self-pay | Admitting: Hematology and Oncology

## 2017-11-27 ENCOUNTER — Other Ambulatory Visit: Payer: Self-pay | Admitting: Hematology and Oncology

## 2017-11-27 DIAGNOSIS — R768 Other specified abnormal immunological findings in serum: Secondary | ICD-10-CM

## 2017-11-27 LAB — HEPATITIS C ANTIBODY (REFLEX): HCV Ab: >11 s/co ratio — ABNORMAL HIGH (ref 0.0–0.9)

## 2017-11-27 LAB — COMMENT2 - HEP PANEL

## 2017-11-27 NOTE — Telephone Encounter (Signed)
Per 10/22 no new orders °

## 2017-11-27 NOTE — Telephone Encounter (Signed)
-----   Message from Heath Lark, MD sent at 11/27/2017  7:05 AM EDT ----- Regarding: hep C positive Result is positive He needs ID referral. I will send ----- Message ----- From: Interface, Lab In [facility] Sent: 11/26/2017   8:21 AM EDT To: Heath Lark, MD

## 2017-11-27 NOTE — Telephone Encounter (Signed)
Notified of message below.  States he has an appt with ID on November 4th

## 2017-12-05 ENCOUNTER — Telehealth: Payer: Self-pay

## 2017-12-05 NOTE — Telephone Encounter (Signed)
Attempted to call patient several times to leave message, but unable to get a answer.   Referral faxed to [facility] at [phone].

## 2017-12-09 ENCOUNTER — Telehealth: Payer: Self-pay

## 2017-12-09 ENCOUNTER — Encounter: Payer: Self-pay | Admitting: Family

## 2017-12-09 ENCOUNTER — Ambulatory Visit (INDEPENDENT_AMBULATORY_CARE_PROVIDER_SITE_OTHER): Payer: Medicare Other | Admitting: Family

## 2017-12-09 VITALS — BP 101/65 | HR 81 | Temp 98.6°F | Wt 116.0 lb

## 2017-12-09 DIAGNOSIS — R768 Other specified abnormal immunological findings in serum: Secondary | ICD-10-CM | POA: Diagnosis not present

## 2017-12-09 NOTE — Telephone Encounter (Signed)
Attempted to call with no answer.  

## 2017-12-09 NOTE — Progress Notes (Signed)
Subjective:    Patient ID: Nicholas King, male    DOB: 08-25-1970, 47 y.o.   MRN: [id number]  Chief Complaint  Patient presents with  . HIV Positive/AIDS    HPI:  Nicholas King is a 47 y.o. male who presents today initial evaluation and treatment of Hepatitis C antibody test.   Nicholas King was recently diagnosed with a positive Hepatitis C antibody test which was found during a cancer remission follow-up. 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 No history of blood transfusion, tattoos, sharing of razors/toothbrushes, and no sexual partners positive for Hepatitis C. Father had liver cancer. No personal history of liver disease. He has not received treatment to date. Denies fevers, chills, fatigue, jaundice, or scleral icterus.    Allergies  Allergen Reactions  . Vancomycin Itching and Rash    Extreme Itching, red skin from head to torso.    Marland Kitchen Cymbalta [Duloxetine Hcl] Other (See Comments)    Suicidal and "crazy"  . Penicillins Hives    Has patient had a PCN reaction causing immediate rash, facial/tongue/throat swelling, SOB or lightheadedness with hypotension: no Has patient had a PCN reaction causing severe rash involving mucus membranes or skin necrosis: no Has patient had a PCN reaction that required hospitalization no Has patient had a PCN reaction occurring within the last 10 years: no If all of the above answers are "NO", then may proceed with Cephalosporin use.   Marland Kitchen Amoxicillin Rash  . Hydrocodone Rash      Outpatient Medications Prior to Visit  Medication Sig Dispense Refill  . gabapentin (NEURONTIN) 300 MG capsule Take 600 mg by mouth 3 (three) times daily.    . feeding supplement, ENSURE ENLIVE, (ENSURE ENLIVE) LIQD Take 237 mLs by mouth 3 (three) times daily between meals. (Patient not taking: Reported on 12/09/2017) 237 mL 12  . levothyroxine (SYNTHROID, LEVOTHROID) 25 MCG tablet Take 25 mcg by mouth daily before breakfast.     No  facility-administered medications prior to visit.      Past Medical History:  Diagnosis Date  . Bone pain 12/26/2012  . Cancer ([place])   . Chronic back pain   . Chronic neck pain    Hx C7 fx after fall 01/2014  . Compression fracture 01/2014   L1  . 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 Dysphagia, unspecified(787.20) 05/16/2012  . GERD (gastroesophageal reflux disease)   . Lumbago 01/06/2013  . Lymphoma ([place])   . Neuropathic pain 11/06/2012  . Neuropathy 12/26/2012  . Pain management   . Pinched nerve in neck   . Pneumonia    April 2014  . 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 Radiation 10/30/12-11/25/12   Bilateral Neck/Tonsil  . Radiation 10/30/12-11/25/12   Bilateral neck and tonsils      Past Surgical History:  Procedure Laterality Date  . DIRECT LARYNGOSCOPY Left 05/08/2012   Procedure: DIRECT LARYNGOSCOPY with biopsy left tonsil;  Surgeon: Melissa Montane, MD;  Location: [facility];  Service: ENT;  Laterality: Left;  . NO PAST SURGERIES    . right eyebrown bone     Right frontal skull fracture      Family History  Problem Relation Age of Onset  . Cancer Father        lung  . Diabetes Father   . Cancer Maternal Aunt        gyn cancer, pancreas  . Cancer Paternal Aunt  pancreas cancer  . Cancer Paternal Uncle        unk  . Cancer Sister       Social History   Socioeconomic History  . Marital status: Single    Spouse name: Not on file  . Number of children: 0  . Years of education: 9th grade  . Highest education level: Not on file  Occupational History  . Occupation: Disability   Social Needs  . Financial resource strain: Not on file  . Food insecurity:    Worry: Not on file    Inability: Not on file  . Transportation needs:    Medical: Not on file    Non-medical: Not on file  Tobacco Use  . Smoking status: Current Every Day Smoker    Packs/day: 0.50    Years: 25.00    Pack years: 12.50    Types: Cigarettes  . Smokeless tobacco: Never  Used  Substance and Sexual Activity  . Alcohol use: No  . Drug use: Yes    Types: Heroin  . Sexual activity: Never  Lifestyle  . Physical activity:    Days per week: Not on file    Minutes per session: Not on file  . Stress: Not on file  Relationships  . Social connections:    Talks on phone: Not on file    Gets together: Not on file    Attends religious service: Not on file    Active member of club or organization: Not on file    Attends meetings of clubs or organizations: Not on file    Relationship status: Not on file  . Intimate partner violence:    Fear of current or ex partner: Not on file    Emotionally abused: Not on file    Physically abused: Not on file    Forced sexual activity: Not on file  Other Topics Concern  . Not on file  Social History Narrative  . Not on file      Review of Systems  Constitutional: Negative for chills, diaphoresis, fatigue and fever.  Respiratory: Negative for cough, chest tightness, shortness of breath and wheezing.   Cardiovascular: Negative for chest pain.  Gastrointestinal: Negative for abdominal distention, abdominal pain, constipation, diarrhea, nausea and vomiting.  Neurological: Negative for weakness and headaches.  Hematological: Does not bruise/bleed easily.       Objective:    BP 101/65   Pulse 81   Temp 98.6 F (37 C) (Oral)   Wt 116 lb (52.6 kg)   BMI 18.17 kg/m  Nursing note and vital signs reviewed.  Physical Exam  Constitutional: He is oriented to person, place, and time. He appears well-developed and well-nourished. No distress.  Pleasant; seated in the chair; appears older than his stated age.   Cardiovascular: Normal rate, regular rhythm, normal heart sounds and intact distal pulses.  Pulmonary/Chest: Effort normal and breath sounds normal.  Abdominal: Soft. He exhibits no distension and no mass. There is no tenderness. There is no rebound.  Neurological: He is alert and oriented to person, place, and  time.  Skin: Skin is warm and dry.  Psychiatric: He has a normal mood and affect.        Assessment & Plan:   Problem List Items Addressed This Visit      Other   Hepatitis C antibody positive in blood - Primary    Mr. Cervone has a positive Hepatitis C antibody test with risk factor for Hepatitis C including IV drug use  with heroin. He most recently used Heroin a couple of days ago. No other blood work is available for review. Check Fibrosure, Hepatitis C genotype and Hepatitis C RNA level. Discussed the transmission, prevention, risk if left untreated, and medications. He met with our pharmacy staff today for financial assistance. Information about Hepatitis C provided in AVS. Follow up office visit pending lab work results.       Relevant Orders   Liver Fibrosis, FibroTest-ActiTest   Hepatitis C genotype   Hepatitis C RNA quantitative       I have discontinued Nicholas King feeding supplement (ENSURE ENLIVE) and levothyroxine. I am also having him maintain his gabapentin.   Follow-up: Pending blood work results.    Terri Piedra, MSN, FNP-C Nurse Practitioner [facility] Office phone: [phone] Pager: [place] number: [phone]

## 2017-12-09 NOTE — Patient Instructions (Signed)
Nice to meet you.  We will check your blood work today.  Plan for follow up pending results.   Limit acetaminophen (Tylenol) usage to no more than 2 grams (2,000 mg) per day.  Avoid alcohol.  Do not share toothbrushes or razors.  Practice safe sex to protect against transmission as well as sexually transmitted disease.    Hepatitis C Hepatitis C is a viral infection of the liver. It can lead to scarring of the liver (cirrhosis), liver failure, or liver cancer. Hepatitis C may go undetected for months or years because people with the infection may not have symptoms, or they may have only mild symptoms. What are the causes? This condition is caused by the hepatitis C virus (HCV). The virus can spread from person to person (is contagious) through:  Blood.  Childbirth. A woman who has hepatitis C can pass it to her baby during birth.  Bodily fluids, such as breast milk, tears, semen, vaginal fluids, and saliva.  Blood transfusions or organ transplants done in the [place] before 1992.  What increases the risk? The following factors may make you more likely to develop this condition:  Having contact with unclean (contaminated) needles or syringes. This may result from: ? Acupuncture. ? Tattoing. ? Body piercing. ? Injecting drugs.  Having unprotected sex with someone who is infected.  Needing treatment to filter your blood (kidney dialysis).  Having HIV (human immunodeficiency virus) or AIDS (acquired immunodeficiency syndrome).  Working in a job that involves contact with blood or bodily fluids, such as health care.  What are the signs or symptoms? Symptoms of this condition include:  Fatigue.  Loss of appetite.  Nausea.  Vomiting.  Abdominal pain.  Dark yellow urine.  Yellowish skin and eyes (jaundice).  Itchy skin.  Clay-colored bowel movements.  Joint pain.  Bleeding and bruising easily.  Fluid building up in your stomach (ascites).  In some  cases, you may not have any symptoms. How is this diagnosed? This condition is diagnosed with:  Blood tests.  Other tests to check how well your liver is functioning. They may include: ? Magnetic resonance elastography (MRE). This imaging test uses MRIs and sound waves to measure liver stiffness. ? Transient elastography. This imaging test uses ultrasounds to measure liver stiffness. ? Liver biopsy. This test requires taking a small tissue sample from your liver to examine it under a microscope.  How is this treated? Your health care provider may perform noninvasive tests or a liver biopsy to help decide the best course of treatment. Treatment may include:  Antiviral medicines and other medicines.  Follow-up treatments every 6-12 months for infections or other liver conditions.  Receiving a donated liver (liver transplant).  Follow these instructions at home: Medicines  Take over-the-counter and prescription medicines only as told by your health care provider.  Take your antiviral medicine as told by your health care provider. Do not stop taking the antiviral even if you start to feel better.  Do not take any medicines unless approved by your health care provider, including over-the-counter medicines and birth control pills. Activity  Rest as needed.  Do not have sex unless approved by your health care provider.  Ask your health care provider when you may return to school or work. Eating and drinking  Eat a balanced diet with plenty of fruits and vegetables, whole grains, and lowfat (lean) meats or non-meat proteins (such as beans or tofu).  Drink enough fluids to keep your urine clear or pale  yellow.  Do not drink alcohol. General instructions  Do not share toothbrushes, nail clippers, or razors.  Wash your hands frequently with soap and water. If soap and water are not available, use hand sanitizer.  Cover any cuts or open sores on your skin to prevent spreading the  virus.  Keep all follow-up visits as told by your health care provider. This is important. You may need follow-up visits every 6-12 months. How is this prevented? There is no vaccine for hepatitis C. The only way to prevent the disease is to reduce the risk of exposure to the virus. Make sure you:  Wash your hands frequently with soap and water. If soap and water are not available, use hand sanitizer.  Do not share needles or syringes.  Practice safe sex and use condoms.  Avoid handling blood or bodily fluids without gloves or other protection.  Avoid getting tattoos or piercings in shops or other locations that are not clean.  Contact a health care provider if:  You have a fever.  You develop abdominal pain.  You pass dark urine.  You pass clay-colored stools.  You develop joint pain. Get help right away if:  You have increasing fatigue or weakness.  You lose your appetite.  You cannot eat or drink without vomiting.  You develop jaundice or your jaundice gets worse.  You bruise or bleed easily. Summary  Hepatitis C is a viral infection of the liver. It can lead to scarring of the liver (cirrhosis), liver failure, or liver cancer.  The hepatitis C virus (HCV) causes this condition. The virus can pass from person to person (is contagious).  You should not take any medicines unless approved by your health care provider. This includes over-the-counter medicines and birth control pills. This information is not intended to replace advice given to you by your health care provider. Make sure you discuss any questions you have with your health care provider. Document Released: 01/20/2000 Document Revised: 02/28/2016 Document Reviewed: 02/28/2016 Elsevier Interactive Patient Education  [employer].

## 2017-12-09 NOTE — Assessment & Plan Note (Signed)
Mr. Keltner has a positive Hepatitis C antibody test with risk factor for Hepatitis C including IV drug use with heroin. He most recently used Heroin a couple of days ago. No other blood work is available for review. Check Fibrosure, Hepatitis C genotype and Hepatitis C RNA level. Discussed the transmission, prevention, risk if left untreated, and medications. He met with our pharmacy staff today for financial assistance. Information about Hepatitis C provided in AVS. Follow up office visit pending lab work results.

## 2017-12-09 NOTE — Telephone Encounter (Signed)
Attempted to call. No answer. Received referral letter from [facility] pain management that they do not take patients insurance.

## 2017-12-13 LAB — HEPATITIS C GENOTYPE

## 2017-12-13 LAB — LIVER FIBROSIS, FIBROTEST-ACTITEST
ALPHA-2-MACROGLOBULIN: 185 mg/dL (ref 106–279)
ALT: 10 U/L (ref 9–46)
APOLIPOPROTEIN A1: 68 mg/dL — AB (ref 94–176)
BILIRUBIN: 0.5 mg/dL (ref 0.2–1.2)
FIBROSIS SCORE: 0.26
GGT: 13 U/L (ref 3–95)
HAPTOGLOBIN: 161 mg/dL (ref 43–212)
Necroinflammat ACT Score: 0.02
Reference ID: 2712429

## 2017-12-13 LAB — HEPATITIS C RNA QUANTITATIVE
HCV Quantitative Log: 7.2 Log IU/mL — ABNORMAL HIGH
HCV RNA, PCR, QN: 15800000 [IU]/mL — AB

## 2017-12-17 ENCOUNTER — Other Ambulatory Visit: Payer: Self-pay | Admitting: Family

## 2017-12-17 DIAGNOSIS — R768 Other specified abnormal immunological findings in serum: Secondary | ICD-10-CM

## 2017-12-17 MED ORDER — LEDIPASVIR-SOFOSBUVIR 90-400 MG PO TABS: 1.0000 | ORAL_TABLET | Freq: Every day | ORAL | 2 refills | Status: DC

## 2018-01-06 ENCOUNTER — Encounter: Payer: Self-pay | Admitting: Pharmacy Technician

## 2018-01-06 MED FILL — HARVONI 90-400 MG TABLET: 90-400 | 28 days supply | Qty: 28 | Fill #0

## 2018-02-11 ENCOUNTER — Ambulatory Visit: Payer: Medicare Other

## 2018-02-19 MED FILL — HARVONI 90-400 MG TABLET: 90-400 | 28 days supply | Qty: 28 | Fill #1

## 2018-03-21 ENCOUNTER — Telehealth: Payer: Self-pay | Admitting: Pharmacy Technician

## 2018-03-21 MED FILL — HARVONI 90-400 MG TABLET: 90-400 | 28 days supply | Qty: 28 | Fill #2

## 2018-03-21 NOTE — Telephone Encounter (Signed)
[facility] Patient Advocate Encounter  Reached patient on the phone today, 03/21/2018 to get his final 4 weeks of the Har[place] filled. The copay was $0 with his insurance.  He had 1 tablet remaining and will pick up today at [facility] before closing at 18:00. He understood the refill process and had no further questions, this is his last refill of the 12 week treatment course.  He wanted to schedule an appointment for follow-up since he missed his January appointment, this was set up by triage.   Bartholomew Crews, CPhT Specialty Pharmacy Patient [facility] for Infectious Disease Phone: [phone] Fax: [phone] 03/21/2018 3:01 PM

## 2018-03-21 NOTE — Telephone Encounter (Signed)
Awesome job!

## 2018-03-24 ENCOUNTER — Ambulatory Visit: Payer: Medicare Other | Admitting: Pharmacist

## 2018-04-07 ENCOUNTER — Ambulatory Visit (INDEPENDENT_AMBULATORY_CARE_PROVIDER_SITE_OTHER): Payer: Medicare Other | Admitting: Pharmacist

## 2018-04-07 DIAGNOSIS — R768 Other specified abnormal immunological findings in serum: Secondary | ICD-10-CM | POA: Diagnosis not present

## 2018-04-07 NOTE — Progress Notes (Signed)
HPI: Nicholas King is a 48 y.o. male who presents to the [facility] for Hepatitis C follow-up.  Medication: Harvoni x 12 weeks  Start Date: ~01/07/18  Hepatitis C Genotype: 1a  Fibrosis Score: F0/F1  Hepatitis C RNA: 15.8 mil on 12/09/17  Patient Active Problem List   Diagnosis Date Noted  . Hepatitis C antibody positive in blood 11/27/2017  . Need for hepatitis C screening test 11/26/2017  . Cellulitis 10/14/2016  . Hyponatremia 10/14/2016  . Skin ulcers of both feet ([place])   . Acquired hypothyroidism 10/12/2016  . Iron deficiency anemia 10/12/2016  . Bug bites 10/12/2016  . Chronic midline back pain 10/13/2015  . Anemia in chronic illness 01/20/2015  . Underweight 12/30/2014  . CAP (community acquired pneumonia) 12/28/2014  . Encounter for central line placement   . Malnutrition of moderate degree ([place]) 08/03/2014  . Fever chills 07/31/2014  . Sepsis ([place]) 07/31/2014  . Leukopenia 07/20/2014  . Thrombocytopenia ([place]) 07/20/2014  . Tobacco abuse 07/20/2014  . Compression fracture 01/19/2014  . Dysphagia 07/20/2013  . Poor dentition 07/20/2013  . Meralgia paraesthetica 01/06/2013  . Lumbago 01/06/2013  . Neuropathy ([place]) 12/26/2012  . Bone pain 12/26/2012  . Neuropathic pain 11/06/2012  . Cachexia ([place]) 05/16/2012  . History of B-cell lymphoma 05/16/2012  . Dysphagia, unspecified(787.20) 05/16/2012  . GERD (gastroesophageal reflux disease) 05/16/2012  . Thyroid nodule 05/16/2012  . Cigarette smoker 05/16/2012    Patient's Medications  New Prescriptions   No medications on file  Previous Medications   GABAPENTIN (NEURONTIN) 300 MG CAPSULE    Take 600 mg by mouth 3 (three) times daily.   LEDIPASVIR-SOFOSBUVIR (HARVONI) 90-400 MG TABS    Take 1 tablet by mouth daily.  Modified Medications   No medications on file  Discontinued Medications   No medications on file    Allergies: Allergies  Allergen Reactions  . Vancomycin Itching and Rash   Extreme Itching, red skin from head to torso.    Marland Kitchen Cymbalta [Duloxetine Hcl] Other (See Comments)    Suicidal and "crazy"  . Penicillins Hives    Has patient had a PCN reaction causing immediate rash, facial/tongue/throat swelling, SOB or lightheadedness with hypotension: no Has patient had a PCN reaction causing severe rash involving mucus membranes or skin necrosis: no Has patient had a PCN reaction that required hospitalization no Has patient had a PCN reaction occurring within the last 10 years: no If all of the above answers are "NO", then may proceed with Cephalosporin use.   Marland Kitchen Amoxicillin Rash  . Hydrocodone Rash    Past Medical History: Past Medical History:  Diagnosis Date  . Bone pain 12/26/2012  . Cancer ([place])   . Chronic back pain   . Chronic neck pain    Hx C7 fx after fall 01/2014  . Compression fracture 01/2014   L1  . 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 Dysphagia, unspecified(787.20) 05/16/2012  . GERD (gastroesophageal reflux disease)   . Lumbago 01/06/2013  . Lymphoma ([place])   . Neuropathic pain 11/06/2012  . Neuropathy 12/26/2012  . Pain management   . Pinched nerve in neck   . Pneumonia    April 2014  . 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 Radiation 10/30/12-11/25/12   Bilateral Neck/Tonsil  . Radiation 10/30/12-11/25/12   Bilateral neck and tonsils    Social History: Social History   Socioeconomic History  . Marital status: Single    Spouse name: Not on file  .  Number of children: 0  . Years of education: 9th grade  . Highest education level: Not on file  Occupational History  . Occupation: Disability   Social Needs  . Financial resource strain: Not on file  . Food insecurity:    Worry: Not on file    Inability: Not on file  . Transportation needs:    Medical: Not on file    Non-medical: Not on file  Tobacco Use  . Smoking status: Current Every Day Smoker    Packs/day: 0.50    Years: 25.00    Pack years: 12.50    Types:  Cigarettes  . Smokeless tobacco: Never Used  Substance and Sexual Activity  . Alcohol use: No  . Drug use: Yes    Types: Heroin  . Sexual activity: Never  Lifestyle  . Physical activity:    Days per week: Not on file    Minutes per session: Not on file  . Stress: Not on file  Relationships  . Social connections:    Talks on phone: Not on file    Gets together: Not on file    Attends religious service: Not on file    Active member of club or organization: Not on file    Attends meetings of clubs or organizations: Not on file    Relationship status: Not on file  Other Topics Concern  . Not on file  Social History Narrative  . Not on file    Labs: Hepatitis C Lab Results  Component Value Date   HCVGENOTYPE 1a 12/09/2017   HCVRNAPCRQN 15,800,000 (H) 12/09/2017   FIBROSTAGE F0-F1 12/09/2017   Hepatitis B Lab Results  Component Value Date   HEPBSAB NONREACTIVE 05/15/2012   HEPBSAG NEGATIVE 05/15/2012   Hepatitis A No results found for: HAV HIV Lab Results  Component Value Date   HIV Non Reactive 10/14/2016   HIV Non Reactive 12/28/2014   HIV NON REACTIVE 05/17/2012   Lab Results  Component Value Date   CREATININE 0.90 11/26/2017   CREATININE 0.63 10/15/2016   CREATININE 0.75 10/14/2016   CREATININE 0.9 10/12/2016   CREATININE 0.7 10/13/2015   Lab Results  Component Value Date   AST 25 11/26/2017   AST 21 10/15/2016   AST 31 10/12/2016   ALT 10 12/09/2017   ALT 15 11/26/2017   ALT 13 (L) 10/15/2016   INR 1.31 07/31/2014   INR 1.11 01/10/2014   INR 0.99 10/26/2013    Assessment: Nicholas King is here today to follow-up for his Hepatitis C infection.  He started Harvoni x 12 weeks around the beginning of December.  He states he has not missed any doses but does have about 2 weeks left of medications when he should have already completed therapy. He is adamant that he has not missed any doses so I suspect that he may have started a little later than 12/3. No  side effects or issuing taking the medication.  He takes it every day around 8pm.  Will check a Hep C RNA and CMET today and have him follow-up in 3 months for his cure visit.  Plan: - Finish out remaining Harvoni - Hep C RNA + CMET today - F/u with me 6/16 at 1130am  Loyde Orth L. Willy Vorce, PharmD, BCIDP, AAHIVP, [place]  for Infectious Disease 04/07/2018, 3:12 PM

## 2018-04-09 LAB — COMPREHENSIVE METABOLIC PANEL
AG Ratio: 1.1 (calc) (ref 1.0–2.5)
ALT: 9 U/L (ref 9–46)
AST: 18 U/L (ref 10–40)
Albumin: 4.2 g/dL (ref 3.6–5.1)
Alkaline phosphatase (APISO): 98 U/L (ref 36–130)
BUN/Creatinine Ratio: 7 (calc) (ref 6–22)
BUN: 6 mg/dL — AB (ref 7–25)
CO2: 25 mmol/L (ref 20–32)
CREATININE: 0.85 mg/dL (ref 0.60–1.35)
Calcium: 9.2 mg/dL (ref 8.6–10.3)
Chloride: 100 mmol/L (ref 98–110)
GLUCOSE: 99 mg/dL (ref 65–99)
Globulin: 3.7 g/dL (calc) (ref 1.9–3.7)
Potassium: 3.7 mmol/L (ref 3.5–5.3)
Sodium: 137 mmol/L (ref 135–146)
Total Bilirubin: 0.9 mg/dL (ref 0.2–1.2)
Total Protein: 7.9 g/dL (ref 6.1–8.1)

## 2018-04-09 LAB — HEPATITIS C RNA QUANTITATIVE
HCV Quantitative Log: <1.18 Log IU/mL
HCV RNA, PCR, QN: <15 IU/mL

## 2018-07-22 ENCOUNTER — Ambulatory Visit: Payer: Medicare Other | Admitting: Pharmacist

## 2019-03-30 DIAGNOSIS — D513 Other dietary vitamin B12 deficiency anemia: Secondary | ICD-10-CM | POA: Diagnosis not present

## 2019-03-30 DIAGNOSIS — M545 Low back pain: Secondary | ICD-10-CM | POA: Diagnosis not present

## 2019-03-30 DIAGNOSIS — G894 Chronic pain syndrome: Secondary | ICD-10-CM | POA: Diagnosis not present

## 2019-03-30 DIAGNOSIS — D510 Vitamin B12 deficiency anemia due to intrinsic factor deficiency: Secondary | ICD-10-CM | POA: Diagnosis not present

## 2019-04-13 DIAGNOSIS — G609 Hereditary and idiopathic neuropathy, unspecified: Secondary | ICD-10-CM | POA: Diagnosis not present

## 2019-04-13 DIAGNOSIS — D500 Iron deficiency anemia secondary to blood loss (chronic): Secondary | ICD-10-CM | POA: Diagnosis not present

## 2019-04-13 DIAGNOSIS — E559 Vitamin D deficiency, unspecified: Secondary | ICD-10-CM | POA: Diagnosis not present

## 2019-04-13 DIAGNOSIS — E039 Hypothyroidism, unspecified: Secondary | ICD-10-CM | POA: Diagnosis not present

## 2019-04-17 ENCOUNTER — Ambulatory Visit (HOSPITAL_COMMUNITY)
Admission: RE | Admit: 2019-04-17 | Discharge: 2019-04-17 | Disposition: A | Discharge status: Home / Self Care | Payer: Medicare Other | Source: Ambulatory Visit | Attending: Family Medicine | Admitting: Family Medicine

## 2019-04-17 ENCOUNTER — Other Ambulatory Visit: Payer: Self-pay

## 2019-04-17 ENCOUNTER — Other Ambulatory Visit (HOSPITAL_COMMUNITY): Payer: Self-pay | Admitting: Family Medicine

## 2019-04-17 DIAGNOSIS — M542 Cervicalgia: Secondary | ICD-10-CM | POA: Insufficient documentation

## 2019-04-17 DIAGNOSIS — D510 Vitamin B12 deficiency anemia due to intrinsic factor deficiency: Secondary | ICD-10-CM | POA: Diagnosis not present

## 2019-05-06 DIAGNOSIS — G609 Hereditary and idiopathic neuropathy, unspecified: Secondary | ICD-10-CM | POA: Diagnosis not present

## 2019-05-06 DIAGNOSIS — E039 Hypothyroidism, unspecified: Secondary | ICD-10-CM | POA: Diagnosis not present

## 2019-06-11 ENCOUNTER — Other Ambulatory Visit: Payer: Medicare Other

## 2019-11-28 ENCOUNTER — Emergency Department (HOSPITAL_COMMUNITY)
Admission: EM | Admit: 2019-11-28 | Discharge: 2019-11-28 | Disposition: A | Discharge status: Home / Self Care | Payer: Medicare Other | Attending: Emergency Medicine | Admitting: Emergency Medicine

## 2019-11-28 ENCOUNTER — Other Ambulatory Visit: Payer: Self-pay

## 2019-11-28 ENCOUNTER — Emergency Department (HOSPITAL_COMMUNITY): Payer: Medicare Other

## 2019-11-28 ENCOUNTER — Encounter (HOSPITAL_COMMUNITY): Payer: Self-pay | Admitting: Emergency Medicine

## 2019-11-28 DIAGNOSIS — R112 Nausea with vomiting, unspecified: Secondary | ICD-10-CM | POA: Diagnosis not present

## 2019-11-28 DIAGNOSIS — R161 Splenomegaly, not elsewhere classified: Secondary | ICD-10-CM | POA: Diagnosis not present

## 2019-11-28 DIAGNOSIS — N23 Unspecified renal colic: Secondary | ICD-10-CM | POA: Insufficient documentation

## 2019-11-28 DIAGNOSIS — I10 Essential (primary) hypertension: Secondary | ICD-10-CM | POA: Diagnosis not present

## 2019-11-28 DIAGNOSIS — K219 Gastro-esophageal reflux disease without esophagitis: Secondary | ICD-10-CM | POA: Diagnosis not present

## 2019-11-28 DIAGNOSIS — R109 Unspecified abdominal pain: Secondary | ICD-10-CM | POA: Diagnosis present

## 2019-11-28 DIAGNOSIS — N1330 Unspecified hydronephrosis: Secondary | ICD-10-CM | POA: Diagnosis not present

## 2019-11-28 DIAGNOSIS — F17210 Nicotine dependence, cigarettes, uncomplicated: Secondary | ICD-10-CM | POA: Insufficient documentation

## 2019-11-28 DIAGNOSIS — Z85828 Personal history of other malignant neoplasm of skin: Secondary | ICD-10-CM | POA: Diagnosis not present

## 2019-11-28 DIAGNOSIS — R1111 Vomiting without nausea: Secondary | ICD-10-CM | POA: Diagnosis not present

## 2019-11-28 DIAGNOSIS — R52 Pain, unspecified: Secondary | ICD-10-CM | POA: Diagnosis not present

## 2019-11-28 DIAGNOSIS — M4856XA Collapsed vertebra, not elsewhere classified, lumbar region, initial encounter for fracture: Secondary | ICD-10-CM | POA: Diagnosis not present

## 2019-11-28 DIAGNOSIS — R110 Nausea: Secondary | ICD-10-CM | POA: Diagnosis not present

## 2019-11-28 LAB — URINALYSIS, ROUTINE W REFLEX MICROSCOPIC
Bacteria, UA: NONE SEEN
Bilirubin Urine: NEGATIVE
Glucose, UA: NEGATIVE mg/dL
Ketones, ur: 20 mg/dL — AB
Leukocytes,Ua: NEGATIVE
Nitrite: NEGATIVE
Protein, ur: NEGATIVE mg/dL
RBC / HPF: >50 RBC/hpf — ABNORMAL HIGH (ref 0–5)
Specific Gravity, Urine: 1.008 (ref 1.005–1.030)
pH: 9 — ABNORMAL HIGH (ref 5.0–8.0)

## 2019-11-28 LAB — CBC WITH DIFFERENTIAL/PLATELET
Abs Immature Granulocytes: 0.01 10*3/uL (ref 0.00–0.07)
Basophils Absolute: 0 10*3/uL (ref 0.0–0.1)
Basophils Relative: 0 %
Eosinophils Absolute: 0 10*3/uL (ref 0.0–0.5)
Eosinophils Relative: 1 %
HCT: 43.7 % (ref 39.0–52.0)
Hemoglobin: 13.9 g/dL (ref 13.0–17.0)
Immature Granulocytes: 0 %
Lymphocytes Relative: 28 %
Lymphs Abs: 1.3 10*3/uL (ref 0.7–4.0)
MCH: 25.8 pg — ABNORMAL LOW (ref 26.0–34.0)
MCHC: 31.8 g/dL (ref 30.0–36.0)
MCV: 81.1 fL (ref 80.0–100.0)
Monocytes Absolute: 0.5 10*3/uL (ref 0.1–1.0)
Monocytes Relative: 11 %
Neutro Abs: 2.9 10*3/uL (ref 1.7–7.7)
Neutrophils Relative %: 60 %
Platelets: 305 10*3/uL (ref 150–400)
RBC: 5.39 MIL/uL (ref 4.22–5.81)
RDW: 15.3 % (ref 11.5–15.5)
WBC: 4.8 10*3/uL (ref 4.0–10.5)
nRBC: 0 % (ref 0.0–0.2)

## 2019-11-28 LAB — BASIC METABOLIC PANEL
Anion gap: 11 (ref 5–15)
BUN: 10 mg/dL (ref 6–20)
CO2: 26 mmol/L (ref 22–32)
Calcium: 9.1 mg/dL (ref 8.9–10.3)
Chloride: 98 mmol/L (ref 98–111)
Creatinine, Ser: 0.81 mg/dL (ref 0.61–1.24)
GFR, Estimated: >60 mL/min (ref >60)
Glucose, Bld: 125 mg/dL — ABNORMAL HIGH (ref 70–99)
Potassium: 3.1 mmol/L — ABNORMAL LOW (ref 3.5–5.1)
Sodium: 135 mmol/L (ref 135–145)

## 2019-11-28 MED ORDER — OXYCODONE-ACETAMINOPHEN 5-325 MG PO TABS: 1.0000 | ORAL_TABLET | ORAL | 0 refills | Status: DC | PRN

## 2019-11-28 MED ORDER — ONDANSETRON HCL 4 MG/2ML IJ SOLN
4.0000 mg | Freq: Once | INTRAMUSCULAR | Status: AC
  Administered 2019-11-28: 4 mg via INTRAVENOUS
  Filled 2019-11-28: qty 2

## 2019-11-28 MED ORDER — HYDROMORPHONE HCL 1 MG/ML IJ SOLN
1.0000 mg | Freq: Once | INTRAMUSCULAR | Status: AC
  Administered 2019-11-28: 1 mg via INTRAVENOUS
  Filled 2019-11-28: qty 1

## 2019-11-28 NOTE — ED Triage Notes (Signed)
Pt with c/o flank pain x 1-2 hrs.

## 2019-11-28 NOTE — ED Provider Notes (Signed)
[facility] EMERGENCY DEPARTMENT Provider Note   CSN: [id number] Arrival date & time: 11/28/19  0539     History Chief Complaint  Patient presents with  . Flank Pain    Nicholas King is a 49 y.o. male.  Patient reports that he had sudden onset of severe stabbing pain in the left flank approximately 2 hours ago.  This is causing nausea and vomiting.  He reports that the pain feels like a knife in his side.  He has never had this pain before.        Past Medical History:  Diagnosis Date  . Bone pain 12/26/2012  . Cancer ([place])   . Chronic back pain   . Chronic neck pain    Hx C7 fx after fall 01/2014  . Compression fracture 01/2014   L1  . 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 Dysphagia, unspecified(787.20) 05/16/2012  . GERD (gastroesophageal reflux disease)   . Lumbago 01/06/2013  . Lymphoma ([place])   . Neuropathic pain 11/06/2012  . Neuropathy 12/26/2012  . Pain management   . Pinched nerve in neck   . Pneumonia    April 2014  . 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 Radiation 10/30/12-11/25/12   Bilateral Neck/Tonsil  . Radiation 10/30/12-11/25/12   Bilateral neck and tonsils    Patient Active Problem List   Diagnosis Date Noted  . Hepatitis C antibody positive in blood 11/27/2017  . Need for hepatitis C screening test 11/26/2017  . Cellulitis 10/14/2016  . Hyponatremia 10/14/2016  . Skin ulcers of both feet ([place])   . Acquired hypothyroidism 10/12/2016  . Iron deficiency anemia 10/12/2016  . Bug bites 10/12/2016  . Chronic midline back pain 10/13/2015  . Anemia in chronic illness 01/20/2015  . Underweight 12/30/2014  . CAP (community acquired pneumonia) 12/28/2014  . Encounter for central line placement   . Malnutrition of moderate degree ([place]) 08/03/2014  . Fever chills 07/31/2014  . Sepsis ([place]) 07/31/2014  . Leukopenia 07/20/2014  . Thrombocytopenia ([place]) 07/20/2014  . Tobacco abuse 07/20/2014  . Compression fracture 01/19/2014  .  Dysphagia 07/20/2013  . Poor dentition 07/20/2013  . Meralgia paraesthetica 01/06/2013  . Lumbago 01/06/2013  . Neuropathy ([place]) 12/26/2012  . Bone pain 12/26/2012  . Neuropathic pain 11/06/2012  . Cachexia ([place]) 05/16/2012  . History of B-cell lymphoma 05/16/2012  . Dysphagia, unspecified(787.20) 05/16/2012  . GERD (gastroesophageal reflux disease) 05/16/2012  . Thyroid nodule 05/16/2012  . Cigarette smoker 05/16/2012    Past Surgical History:  Procedure Laterality Date  . DIRECT LARYNGOSCOPY Left 05/08/2012   Procedure: DIRECT LARYNGOSCOPY with biopsy left tonsil;  Surgeon: Melissa Montane, MD;  Location: [facility];  Service: ENT;  Laterality: Left;  . NO PAST SURGERIES    . right eyebrown bone     Right frontal skull fracture       Family History  Problem Relation Age of Onset  . Cancer Father        lung  . Diabetes Father   . Cancer Maternal Aunt        gyn cancer, pancreas  . Cancer Paternal Aunt        pancreas cancer  . Cancer Paternal Uncle        unk  . Cancer Sister     Social History   Tobacco Use  . Smoking status: Current Every Day Smoker    Packs/day: 0.50    Years: 25.00    Pack years: 12.50  Types: Cigarettes  . Smokeless tobacco: Never Used  Vaping Use  . Vaping Use: Never used  Substance Use Topics  . Alcohol use: No  . Drug use: Yes    Types: Heroin    Home Medications Prior to Admission medications   Medication Sig Start Date End Date Taking? Authorizing Provider  gabapentin (NEURONTIN) 300 MG capsule Take 600 mg by mouth 3 (three) times daily.    [provider]  Ledipasvir-Sofosbuvir (HARVONI) 90-400 MG TABS Take 1 tablet by mouth daily. 12/17/17   Golden Circle, FNP  oxyCODONE-acetaminophen (PERCOCET) 5-325 MG tablet Take 1 tablet by mouth every 4 (four) hours as needed. 11/28/19   Orpah Greek, MD    Allergies    Vancomycin, Cymbalta [duloxetine hcl], Penicillins, Amoxicillin, and Hydrocodone  Review of Systems    Review of Systems  Gastrointestinal: Positive for nausea and vomiting.  Genitourinary: Positive for flank pain.  All other systems reviewed and are negative.   Physical Exam Updated Vital Signs BP (!) 130/109   Pulse (!) 55   Temp 98.7 F (37.1 C) (Oral)   Resp 18   Ht 5\' 7"  (1.702 m)   Wt 59 kg   SpO2 100%   BMI 20.36 kg/m   Physical Exam Vitals and nursing note reviewed.  Constitutional:      General: He is in acute distress.     Appearance: Normal appearance. He is well-developed.  HENT:     Head: Normocephalic and atraumatic.     Right Ear: Hearing normal.     Left Ear: Hearing normal.     Nose: Nose normal.  Eyes:     Conjunctiva/sclera: Conjunctivae normal.     Pupils: Pupils are equal, round, and reactive to light.  Cardiovascular:     Rate and Rhythm: Regular rhythm.     Heart sounds: S1 normal and S2 normal. No murmur heard.  No friction rub. No gallop.   Pulmonary:     Effort: Pulmonary effort is normal. No respiratory distress.     Breath sounds: Normal breath sounds.  Chest:     Chest wall: No tenderness.  Abdominal:     General: Bowel sounds are normal.     Palpations: Abdomen is soft.     Tenderness: There is no abdominal tenderness. There is no guarding or rebound. Negative signs include Murphy's sign and McBurney's sign.     Hernia: No hernia is present.  Musculoskeletal:        General: Normal range of motion.     Cervical back: Normal range of motion and neck supple.  Skin:    General: Skin is warm and dry.     Findings: No rash.  Neurological:     Mental Status: He is alert and oriented to person, place, and time.     GCS: GCS eye subscore is 4. GCS verbal subscore is 5. GCS motor subscore is 6.     Cranial Nerves: No cranial nerve deficit.     Sensory: No sensory deficit.     Coordination: Coordination normal.  Psychiatric:        Speech: Speech normal.        Behavior: Behavior normal.        Thought Content: Thought content normal.      ED Results / Procedures / Treatments   Labs (all labs ordered are listed, but only abnormal results are displayed) Labs Reviewed  CBC WITH DIFFERENTIAL/PLATELET - Abnormal; Notable for the following components:  Result Value   MCH 25.8 (*)    All other components within normal limits  BASIC METABOLIC PANEL - Abnormal; Notable for the following components:   Potassium 3.1 (*)    Glucose, Bld 125 (*)    All other components within normal limits  URINALYSIS, ROUTINE W REFLEX MICROSCOPIC - Abnormal; Notable for the following components:   Color, Urine COLORLESS (*)    pH 9.0 (*)    Hgb urine dipstick LARGE (*)    Ketones, ur 20 (*)    RBC / HPF >50 (*)    All other components within normal limits    EKG None  Radiology CT RENAL STONE STUDY  Result Date: 11/28/2019 CLINICAL DATA:  Flank pain. EXAM: CT ABDOMEN AND PELVIS WITHOUT CONTRAST TECHNIQUE: Multidetector CT imaging of the abdomen and pelvis was performed following the standard protocol without IV contrast. COMPARISON:  CT abdomen dated 08/02/2014 FINDINGS: Lower chest: No acute abnormality. Hepatobiliary: No focal liver abnormality is seen. No gallstones, gallbladder wall thickening, or biliary dilatation. Pancreas: Unremarkable. No pancreatic ductal dilatation or surrounding inflammatory changes. Spleen: Questionable splenomegaly. Adrenals/Urinary Tract: Moderate LEFT-sided hydronephrosis and perinephric fluid/inflammation. No ureteral or bladder stone is identified. RIGHT kidney is unremarkable without stone or hydronephrosis. Stomach/Bowel: No dilated large or small bowel loops. No evidence of bowel wall inflammation. Appendix is normal. Vascular/Lymphatic: No significant vascular findings are present. No enlarged abdominal or pelvic lymph nodes. Reproductive: Prostate is unremarkable. Other: No free fluid or abscess collection. No free intraperitoneal air. Musculoskeletal: No acute or suspicious osseous finding. Chronic  compression fracture deformity of the L1 vertebral body. IMPRESSION: 1. Moderate LEFT-sided hydronephrosis and perinephric fluid/inflammation. No ureteral or bladder stone is identified. Findings could represent a recently passed stone or pyelonephritis. Recommend correlation with clinical symptoms and/or urinalysis to help differentiate between these 2 possibilities. 2. Borderline splenomegaly. 3. Remainder of the abdomen and pelvis CT is unremarkable. No bowel obstruction or evidence of bowel wall inflammation. No free fluid or abscess collection. Appendix is normal. Electronically Signed   By: Franki Cabot M.D.   On: 11/28/2019 06:12    Procedures Procedures (including critical care time)  Medications Ordered in ED Medications  HYDROmorphone (DILAUDID) injection 1 mg (1 mg Intravenous Given 11/28/19 0334)  ondansetron (ZOFRAN) injection 4 mg (4 mg Intravenous Given 11/28/19 3825)    ED Course  I have reviewed the triage vital signs and the nursing notes.  Pertinent labs & imaging results that were available during my care of the patient were reviewed by me and considered in my medical decision making (see chart for details).    MDM Rules/Calculators/A&P                          Patient presents to the emergency department with acute onset of severe left flank pain proximally 2 hours before arrival.  Patient appears to be in distress arrival to the emergency department, obviously in pain.  He has had some nausea and vomiting.  This presentation is consistent with renal colic.  Blood work was unremarkable.  Urinalysis shows large blood without signs of infection.  CT scan shows hydronephrosis but no stone.  This is suspected as a recently passed stone.  Will discharge and have follow-up with urology to ensure that this is not a chronic hydronephrosis that requires further evaluation.  Final Clinical Impression(s) / ED Diagnoses Final diagnoses:  Renal colic on left side    Rx / DC  Orders  ED Discharge Orders         Ordered    oxyCODONE-acetaminophen (PERCOCET) 5-325 MG tablet  Every 4 hours PRN        11/28/19 0631           Orpah Greek, MD 11/28/19 [phone]

## 2019-11-30 MED FILL — Oxycodone w/ Acetaminophen Tab 5-325 MG: ORAL | Qty: 6 | Status: AC

## 2020-01-27 DIAGNOSIS — H903 Sensorineural hearing loss, bilateral: Secondary | ICD-10-CM | POA: Diagnosis not present

## 2020-01-27 DIAGNOSIS — H60332 Swimmer's ear, left ear: Secondary | ICD-10-CM | POA: Diagnosis not present

## 2020-01-27 DIAGNOSIS — H838X3 Other specified diseases of inner ear, bilateral: Secondary | ICD-10-CM | POA: Diagnosis not present

## 2020-07-09 ENCOUNTER — Emergency Department (HOSPITAL_COMMUNITY): Payer: Medicare Other

## 2020-07-09 ENCOUNTER — Emergency Department (HOSPITAL_COMMUNITY)
Admission: EM | Admit: 2020-07-09 | Discharge: 2020-07-09 | Disposition: A | Discharge status: Home / Self Care | Payer: Medicare Other | Attending: Emergency Medicine | Admitting: Emergency Medicine

## 2020-07-09 ENCOUNTER — Other Ambulatory Visit: Payer: Self-pay

## 2020-07-09 ENCOUNTER — Encounter (HOSPITAL_COMMUNITY): Payer: Self-pay | Admitting: Emergency Medicine

## 2020-07-09 DIAGNOSIS — S60212A Contusion of left wrist, initial encounter: Secondary | ICD-10-CM | POA: Insufficient documentation

## 2020-07-09 DIAGNOSIS — F17210 Nicotine dependence, cigarettes, uncomplicated: Secondary | ICD-10-CM | POA: Insufficient documentation

## 2020-07-09 DIAGNOSIS — Z23 Encounter for immunization: Secondary | ICD-10-CM | POA: Insufficient documentation

## 2020-07-09 DIAGNOSIS — S6992XA Unspecified injury of left wrist, hand and finger(s), initial encounter: Secondary | ICD-10-CM | POA: Diagnosis present

## 2020-07-09 MED ORDER — IBUPROFEN 800 MG PO TABS: 800.0000 mg | ORAL_TABLET | Freq: Three times a day (TID) | ORAL | 0 refills | Status: DC | PRN

## 2020-07-09 MED ORDER — IBUPROFEN 800 MG PO TABS
800.0000 mg | ORAL_TABLET | Freq: Once | ORAL | Status: AC
  Administered 2020-07-09: 800 mg via ORAL
  Filled 2020-07-09: qty 1

## 2020-07-09 MED ORDER — TETANUS-DIPHTH-ACELL PERTUSSIS 5-2.5-18.5 LF-MCG/0.5 IM SUSY
0.5000 mL | PREFILLED_SYRINGE | Freq: Once | INTRAMUSCULAR | Status: AC
  Administered 2020-07-09: 0.5 mL via INTRAMUSCULAR
  Filled 2020-07-09: qty 0.5
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
## 2021-02-23 ENCOUNTER — Inpatient Hospital Stay (HOSPITAL_COMMUNITY)
Admission: EM | Admit: 2021-02-23 | Discharge: 2021-02-25 | DRG: 482 | Disposition: A | Discharge status: Home Health | Payer: Medicare Other | Attending: Family Medicine | Admitting: Family Medicine

## 2021-02-23 ENCOUNTER — Other Ambulatory Visit: Payer: Self-pay

## 2021-02-23 ENCOUNTER — Encounter (HOSPITAL_COMMUNITY): Payer: Self-pay

## 2021-02-23 ENCOUNTER — Ambulatory Visit (INDEPENDENT_AMBULATORY_CARE_PROVIDER_SITE_OTHER): Payer: Medicare Other | Admitting: Orthopaedic Surgery

## 2021-02-23 ENCOUNTER — Emergency Department (HOSPITAL_COMMUNITY): Payer: Medicare Other

## 2021-02-23 ENCOUNTER — Encounter: Payer: Self-pay | Admitting: Orthopaedic Surgery

## 2021-02-23 ENCOUNTER — Ambulatory Visit: Payer: Medicare Other

## 2021-02-23 VITALS — BP 153/101 | HR 104 | Ht 67.0 in | Wt 135.0 lb

## 2021-02-23 DIAGNOSIS — Z833 Family history of diabetes mellitus: Secondary | ICD-10-CM

## 2021-02-23 DIAGNOSIS — E039 Hypothyroidism, unspecified: Secondary | ICD-10-CM | POA: Diagnosis present

## 2021-02-23 DIAGNOSIS — S72141A Displaced intertrochanteric fracture of right femur, initial encounter for closed fracture: Principal | ICD-10-CM | POA: Diagnosis present

## 2021-02-23 DIAGNOSIS — Z8572 Personal history of non-Hodgkin lymphomas: Secondary | ICD-10-CM | POA: Diagnosis not present

## 2021-02-23 DIAGNOSIS — M25551 Pain in right hip: Secondary | ICD-10-CM

## 2021-02-23 DIAGNOSIS — Z881 Allergy status to other antibiotic agents status: Secondary | ICD-10-CM | POA: Diagnosis not present

## 2021-02-23 DIAGNOSIS — G8929 Other chronic pain: Secondary | ICD-10-CM | POA: Diagnosis present

## 2021-02-23 DIAGNOSIS — K219 Gastro-esophageal reflux disease without esophagitis: Secondary | ICD-10-CM | POA: Diagnosis present

## 2021-02-23 DIAGNOSIS — R768 Other specified abnormal immunological findings in serum: Secondary | ICD-10-CM | POA: Diagnosis present

## 2021-02-23 DIAGNOSIS — K089 Disorder of teeth and supporting structures, unspecified: Secondary | ICD-10-CM

## 2021-02-23 DIAGNOSIS — Z885 Allergy status to narcotic agent status: Secondary | ICD-10-CM

## 2021-02-23 DIAGNOSIS — S72001A Fracture of unspecified part of neck of right femur, initial encounter for closed fracture: Secondary | ICD-10-CM | POA: Diagnosis not present

## 2021-02-23 DIAGNOSIS — F17210 Nicotine dependence, cigarettes, uncomplicated: Secondary | ICD-10-CM | POA: Diagnosis present

## 2021-02-23 DIAGNOSIS — Z20822 Contact with and (suspected) exposure to covid-19: Secondary | ICD-10-CM | POA: Diagnosis present

## 2021-02-23 DIAGNOSIS — Z800 Family history of malignant neoplasm of digestive organs: Secondary | ICD-10-CM | POA: Diagnosis not present

## 2021-02-23 DIAGNOSIS — T148XXA Other injury of unspecified body region, initial encounter: Secondary | ICD-10-CM

## 2021-02-23 DIAGNOSIS — Z880 Allergy status to penicillin: Secondary | ICD-10-CM

## 2021-02-23 DIAGNOSIS — D638 Anemia in other chronic diseases classified elsewhere: Secondary | ICD-10-CM

## 2021-02-23 DIAGNOSIS — Z01818 Encounter for other preprocedural examination: Secondary | ICD-10-CM

## 2021-02-23 DIAGNOSIS — Z23 Encounter for immunization: Secondary | ICD-10-CM

## 2021-02-23 DIAGNOSIS — W06XXXA Fall from bed, initial encounter: Secondary | ICD-10-CM

## 2021-02-23 DIAGNOSIS — W19XXXA Unspecified fall, initial encounter: Secondary | ICD-10-CM

## 2021-02-23 DIAGNOSIS — Z888 Allergy status to other drugs, medicaments and biological substances status: Secondary | ICD-10-CM | POA: Diagnosis not present

## 2021-02-23 DIAGNOSIS — E559 Vitamin D deficiency, unspecified: Secondary | ICD-10-CM | POA: Diagnosis present

## 2021-02-23 DIAGNOSIS — G629 Polyneuropathy, unspecified: Secondary | ICD-10-CM | POA: Diagnosis present

## 2021-02-23 DIAGNOSIS — M792 Neuralgia and neuritis, unspecified: Secondary | ICD-10-CM | POA: Diagnosis present

## 2021-02-23 DIAGNOSIS — S72001D Fracture of unspecified part of neck of right femur, subsequent encounter for closed fracture with routine healing: Secondary | ICD-10-CM | POA: Diagnosis not present

## 2021-02-23 DIAGNOSIS — S72009A Fracture of unspecified part of neck of unspecified femur, initial encounter for closed fracture: Secondary | ICD-10-CM

## 2021-02-23 LAB — RESP PANEL BY RT-PCR (FLU A&B, COVID) ARPGX2
Influenza A by PCR: NEGATIVE
Influenza B by PCR: NEGATIVE
SARS Coronavirus 2 by RT PCR: NEGATIVE

## 2021-02-23 LAB — BASIC METABOLIC PANEL
Anion gap: 12 (ref 5–15)
BUN: 21 mg/dL — ABNORMAL HIGH (ref 6–20)
CO2: 22 mmol/L (ref 22–32)
Calcium: 9 mg/dL (ref 8.9–10.3)
Chloride: 105 mmol/L (ref 98–111)
Creatinine, Ser: 0.75 mg/dL (ref 0.61–1.24)
GFR, Estimated: >60 mL/min (ref >60)
Glucose, Bld: 110 mg/dL — ABNORMAL HIGH (ref 70–99)
Potassium: 3.5 mmol/L (ref 3.5–5.1)
Sodium: 139 mmol/L (ref 135–145)

## 2021-02-23 LAB — CBC WITH DIFFERENTIAL/PLATELET
Abs Immature Granulocytes: 0.04 10*3/uL (ref 0.00–0.07)
Basophils Absolute: 0 10*3/uL (ref 0.0–0.1)
Basophils Relative: 0 %
Eosinophils Absolute: 0 10*3/uL (ref 0.0–0.5)
Eosinophils Relative: 1 %
HCT: 41.2 % (ref 39.0–52.0)
Hemoglobin: 13.6 g/dL (ref 13.0–17.0)
Immature Granulocytes: 1 %
Lymphocytes Relative: 8 %
Lymphs Abs: 0.6 10*3/uL — ABNORMAL LOW (ref 0.7–4.0)
MCH: 28.6 pg (ref 26.0–34.0)
MCHC: 33 g/dL (ref 30.0–36.0)
MCV: 86.6 fL (ref 80.0–100.0)
Monocytes Absolute: 0.3 10*3/uL (ref 0.1–1.0)
Monocytes Relative: 5 %
Neutro Abs: 6.6 10*3/uL (ref 1.7–7.7)
Neutrophils Relative %: 85 %
Platelets: 248 10*3/uL (ref 150–400)
RBC: 4.76 MIL/uL (ref 4.22–5.81)
RDW: 13.7 % (ref 11.5–15.5)
WBC: 7.6 10*3/uL (ref 4.0–10.5)
nRBC: 0 % (ref 0.0–0.2)

## 2021-02-23 LAB — TYPE AND SCREEN
ABO/RH(D): A POS
Antibody Screen: NEGATIVE

## 2021-02-23 MED ORDER — HEPARIN SODIUM (PORCINE) 5000 UNIT/ML IJ SOLN
5000.0000 [IU] | Freq: Three times a day (TID) | INTRAMUSCULAR | Status: AC
  Administered 2021-02-23 (×2): 5000 [IU] via SUBCUTANEOUS
  Filled 2021-02-23 (×2): qty 1

## 2021-02-23 MED ORDER — PANTOPRAZOLE SODIUM 40 MG PO TBEC
40.0000 mg | DELAYED_RELEASE_TABLET | Freq: Every evening | ORAL | Status: DC
  Administered 2021-02-23 – 2021-02-24 (×2): 40 mg via ORAL
  Filled 2021-02-23 (×2): qty 1

## 2021-02-23 MED ORDER — MORPHINE SULFATE (PF) 2 MG/ML IV SOLN
0.5000 mg | INTRAVENOUS | Status: DC | PRN
  Administered 2021-02-24 – 2021-02-25 (×3): 0.5 mg via INTRAVENOUS
  Filled 2021-02-23 (×3): qty 1

## 2021-02-23 MED ORDER — BISACODYL 5 MG PO TBEC: 5.0000 mg | DELAYED_RELEASE_TABLET | Freq: Every day | ORAL | Status: DC | PRN

## 2021-02-23 MED ORDER — ZOLPIDEM TARTRATE 5 MG PO TABS: 5.0000 mg | ORAL_TABLET | Freq: Every evening | ORAL | Status: DC | PRN

## 2021-02-23 MED ORDER — OXYCODONE-ACETAMINOPHEN 5-325 MG PO TABS
1.0000 | ORAL_TABLET | Freq: Four times a day (QID) | ORAL | Status: DC | PRN
  Administered 2021-02-23 – 2021-02-25 (×3): 2 via ORAL
  Filled 2021-02-23 (×3): qty 2

## 2021-02-23 MED ORDER — HYDROCODONE-ACETAMINOPHEN 5-325 MG PO TABS
1.0000 | ORAL_TABLET | Freq: Four times a day (QID) | ORAL | Status: DC | PRN
  Filled 2021-02-23: qty 2

## 2021-02-23 MED ORDER — TRANEXAMIC ACID-NACL 1000-0.7 MG/100ML-% IV SOLN
1000.0000 mg | Freq: Once | INTRAVENOUS | Status: AC
  Administered 2021-02-23: 1000 mg via INTRAVENOUS
  Filled 2021-02-23: qty 100

## 2021-02-23 MED ORDER — ACETAMINOPHEN 325 MG PO TABS: 650.0000 mg | ORAL_TABLET | Freq: Four times a day (QID) | ORAL | Status: DC | PRN

## 2021-02-23 MED ORDER — OXYCODONE-ACETAMINOPHEN 5-325 MG PO TABS
1.0000 | ORAL_TABLET | Freq: Once | ORAL | Status: AC
  Administered 2021-02-23: 1 via ORAL
  Filled 2021-02-23: qty 1

## 2021-02-23 NOTE — Progress Notes (Signed)
Subjective:    Patient ID: Nicholas King, male    DOB: [birth year], 51 y.o.   MRN: [id number]  HPI He is in [facility] as an inmate.  He fell out of the top bunk on 02-20-21.  X-rays done showed nondisplaced trochanteric fracture.  I have the report but not the films to review.  He is in pain.  He is in a wheelchair.  He had no other injury.  He is accompanied by Quarry manager.    He is unable to stand, he is in pain.   Review of Systems  Constitutional:  Positive for activity change.  Musculoskeletal:  Positive for arthralgias and gait problem.  All other systems reviewed and are negative. For Review of Systems, all other systems reviewed and are negative.  The following is a summary of the past history medically, past history surgically, known current medicines, social history and family history.  This information is gathered electronically by the computer from prior information and documentation.  I review this each visit and have found including this information at this point in the chart is beneficial and informative.   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[place])    Dysphagia, unspecified(787.20) 05/16/2012   GERD (gastroesophageal reflux disease)    Lumbago 01/06/2013   Lymphoma ([place])    Neuropathic pain 11/06/2012   Neuropathy 12/26/2012   Pain management    Pinched nerve in neck    Pneumonia    April 2014   Post lumbar puncture headache 07/03/2012   Radiation 10/30/12-11/25/12   Bilateral Neck/Tonsil   Radiation 10/30/12-11/25/12   Bilateral neck and tonsils    Past Surgical History:  Procedure Laterality Date   DIRECT LARYNGOSCOPY Left 05/08/2012   Procedure: DIRECT LARYNGOSCOPY with biopsy left tonsil;  Surgeon: Melissa Montane, MD;  Location: [facility] OR;  Service: ENT;  Laterality:  Left;   NO PAST SURGERIES     right eyebrown bone     Right frontal skull fracture    Current Outpatient Medications on File Prior to Visit  Medication Sig Dispense Refill   ibuprofen (ADVIL) 800 MG tablet Take 1 tablet (800 mg total) by mouth every 8 (eight) hours as needed. 21 tablet 0   No current facility-administered medications on file prior to visit.    Social History   Socioeconomic History   Marital status: Single    Spouse name: Not on file   Number of children: 0   Years of education: 9th grade   Highest education level: Not on file  Occupational History   Occupation: Disability   Tobacco Use   Smoking status: Every Day    Packs/day: 0.50    Years: 25.00    Pack years: 12.50    Types: Cigarettes   Smokeless tobacco: Never  Vaping Use   Vaping Use: Never used  Substance and Sexual Activity   Alcohol use: No   Drug use: Yes    Types: Heroin   Sexual activity: Never  Other Topics Concern   Not on file  Social History Narrative   Not on file   Social Determinants of Health   Financial Resource Strain: Not on file  Food Insecurity: Not on file  Transportation Needs: Not on file  Physical Activity: Not on file  Stress: Not on file  Social Connections: Not on file  Intimate Partner Violence: Not on file    Family History  Problem Relation Age of Onset   Cancer Father        lung   Diabetes Father    Cancer Maternal Aunt        gyn cancer, pancreas   Cancer Paternal Aunt        pancreas cancer   Cancer Paternal Uncle        unk   Cancer Sister     BP (!) 153/101    Pulse (!) 104    Ht 5\' 7"  (1.702 m)    Wt 135 lb (61.2 kg)    BMI 21.14 kg/m   Body mass index is 21.14 kg/m.     Objective:   Physical Exam Vitals and nursing note reviewed. Exam conducted with a chaperone present.  Constitutional:      Appearance: He is well-developed.  HENT:     Head: Normocephalic and atraumatic.  Eyes:     Conjunctiva/sclera: Conjunctivae normal.      Pupils: Pupils are equal, round, and reactive to light.  Cardiovascular:     Rate and Rhythm: Normal rate and regular rhythm.  Pulmonary:     Effort: Pulmonary effort is normal.  Abdominal:     Palpations: Abdomen is soft.  Musculoskeletal:     Cervical back: Normal range of motion and neck supple.       Legs:  Skin:    General: Skin is warm and dry.  Neurological:     Mental Status: He is alert and oriented to person, place, and time.     Cranial Nerves: No cranial nerve deficit.     Motor: No abnormal muscle tone.     Coordination: Coordination normal.     Deep Tendon Reflexes: Reflexes are normal and symmetric. Reflexes normal.  Psychiatric:        Behavior: Behavior normal.        Thought Content: Thought content normal.        Judgment: Judgment normal.   X-rays were done of the right hip, reported separately.       Assessment & Plan:   Encounter Diagnoses  Name Primary?   Pain in right hip Yes   Intertrochanteric fracture of right hip, closed, initial encounter [facility])    I have told him and the deputy of the findings.  He will need surgery on the right hip urgently.  The fracture is not displaced yet but that could happen at any time.  I filled out forms for the jail.    I have shown the patient the X-rays.  The medical personnel at the jail can call me if any questions.  Call if any problem.  Precautions discussed.  Electronically Signed Sanjuana Kava, MD 1/19/20238:48 AM

## 2021-02-23 NOTE — ED Notes (Signed)
Pt fell from top bunk a week ago and c/o right hip pain and unable to put weight on right hip since.  Pt was in jail and released today per pt.  Pt seen Dr. Luna Glasgow this morning and was informed that right hip is fx'd.

## 2021-02-23 NOTE — H&P (Signed)
History and Physical  [place] KWI:[id number] DOB: [birth year] DOA: 02/23/2021  PCP: Pcp, No  Patient coming from: Dr. Brooke Bonito office  Level of care: Med-Surg  I have personally briefly reviewed patient's old medical records in [place]  Chief Complaint: right hip pain   HPI: Nicholas King is a 51 y.o. male with medical history significant for B-cell lymphoma who was recently incarcerated (just released today) unfortunately fell out of a bunk bed while incarcerated 1 week ago (02/16/21).  He reportedly did hit his head and fell on right hip but did not lose consciousness.  He lay in bed unable to bear weight for several days in severe pain.  He was finally sent for xrays but reportedly never was informed of the results.  He was told by prison staff that they were taking him to see an orthopedist today.  He saw Dr. Luna Glasgow who got xrays that revealed intertrochanteric right hip fracture nondisplaced.  He reports that he was told his prison sentence was adjusted and he was released from custody today.  He served about 133 days out of a 200 day sentence for a larceny misdemeanor.  Dr. Luna Glasgow made arrangements for him to come to the ED for treatment.  Dr. Amedeo Kinsman orthopedist on call was consulted and agreed to see patient with plans for operative management in the next 24 hours.    Review of Systems: Review of Systems  Constitutional:  Positive for diaphoresis and malaise/fatigue. Negative for fever and weight loss.  HENT: Negative.    Eyes: Negative.   Respiratory: Negative.    Cardiovascular: Negative.   Gastrointestinal: Negative.   Genitourinary: Negative.   Musculoskeletal:  Positive for falls, joint pain and myalgias.  Skin: Negative.   Neurological: Negative.   Endo/Heme/Allergies: Negative.   Psychiatric/Behavioral: Negative.    All other systems reviewed and are negative.   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[place])    Dysphagia, unspecified(787.20) 05/16/2012   GERD (gastroesophageal reflux disease)    Lumbago 01/06/2013   Lymphoma ([place])    Neuropathic pain 11/06/2012   Neuropathy 12/26/2012   Pain management    Pinched nerve in neck    Pneumonia    April 2014   Post lumbar puncture headache 07/03/2012   Radiation 10/30/12-11/25/12   Bilateral Neck/Tonsil   Radiation 10/30/12-11/25/12   Bilateral neck and tonsils    Past Surgical History:  Procedure Laterality Date   DIRECT LARYNGOSCOPY Left 05/08/2012   Procedure: DIRECT LARYNGOSCOPY with biopsy left tonsil;  Surgeon: Melissa Montane, MD;  Location: [facility] OR;  Service: ENT;  Laterality: Left;   NO PAST SURGERIES     right eyebrown bone     Right frontal skull fracture     reports that he has been smoking cigarettes. He has a 12.50 pack-year smoking history. He has never used smokeless tobacco. He reports current drug use. Drug: Heroin. He reports that he does not drink alcohol.  Allergies  Allergen Reactions   Vancomycin Itching and Rash    Extreme Itching, red skin from head to torso.     Cymbalta [Duloxetine Hcl] Other (See Comments)    Suicidal and "crazy"   Penicillins Hives    Has  patient had a PCN reaction causing immediate rash, facial/tongue/throat swelling, SOB or lightheadedness with hypotension: no Has patient had a PCN reaction causing severe rash involving mucus membranes or skin necrosis: no Has patient had a PCN reaction that required hospitalization no Has patient had a PCN reaction occurring within the last 10 years: no If all of the above answers are "NO", then may proceed with Cephalosporin use.    Amoxicillin Rash   Hydrocodone Rash    Family History  Problem Relation Age of Onset   Cancer Father        lung   Diabetes Father    Cancer Maternal Aunt        gyn  cancer, pancreas   Cancer Paternal Aunt        pancreas cancer   Cancer Paternal Uncle        unk   Cancer Sister     Prior to Admission medications   Not on File    Physical Exam: Vitals:   02/23/21 1155 02/23/21 1157 02/23/21 1340 02/23/21 1345  BP:  (!) 151/99  (!) 139/100  Pulse:  (!) 110  88  Resp:  18  20  Temp:  98.3 F (36.8 C) 98.3 F (36.8 C)   TempSrc:  Oral Oral   SpO2:  99%  99%  Weight: 61.2 kg     Height: 5\' 7"  (1.702 m)      Constitutional: very thin male, appears uncomfortable, but not distressed.  Eyes: PERRL, lids and conjunctivae normal ENMT: Mucous membranes are dry. Posterior pharynx clear of any exudate or lesions. Poor dentition.  Neck: normal, supple, no masses, no thyromegaly.  Respiratory: clear to auscultation bilaterally, no wheezing, no crackles. Normal respiratory effort. No accessory muscle use.  Cardiovascular: normal s1, s2 sounds, no murmurs / rubs / gallops. No extremity edema. 2+ pedal pulses. No carotid bruits.  Abdomen: no tenderness, no masses palpated. No hepatosplenomegaly. Bowel sounds positive.  Musculoskeletal: no clubbing / cyanosis. Right leg externally rotated. Warm extremities, warm feet bilateral,  no contractures. Normal muscle tone.  Skin: no rashes, lesions, ulcers. No induration Neurologic: CN 2-12 grossly intact. Sensation intact, DTR normal. Strength 5/5 in all 4.  Psychiatric: Normal judgment and insight. Alert and oriented x 3. Normal mood.   Labs on Admission: I have personally reviewed following labs and imaging studies  CBC: Recent Labs  Lab 02/23/21 1240  WBC 7.6  NEUTROABS 6.6  HGB 13.6  HCT 41.2  MCV 86.6  PLT 283   Basic Metabolic Panel: Recent Labs  Lab 02/23/21 1240  NA 139  K 3.5  CL 105  CO2 22  GLUCOSE 110*  BUN 21*  CREATININE 0.75  CALCIUM 9.0   GFR: Estimated Creatinine Clearance: 95.6 mL/min (by C-G formula based on SCr of 0.75 mg/dL). Liver Function Tests: No results for  input(s): AST, ALT, ALKPHOS, BILITOT, PROT, ALBUMIN in the last 168 hours. No results for input(s): LIPASE, AMYLASE in the last 168 hours. No results for input(s): AMMONIA in the last 168 hours. Coagulation Profile: No results for input(s): INR, PROTIME in the last 168 hours. Cardiac Enzymes: No results for input(s): CKTOTAL, CKMB, CKMBINDEX, TROPONINI in the last 168 hours. BNP (last 3 results) No results for input(s): PROBNP in the last 8760 hours. HbA1C: No results for input(s): HGBA1C in the last 72 hours. CBG: No results for input(s): GLUCAP in the last 168 hours. Lipid Profile: No results for input(s): CHOL, HDL, LDLCALC, TRIG, CHOLHDL, LDLDIRECT in the  last 72 hours. Thyroid Function Tests: No results for input(s): TSH, T4TOTAL, FREET4, T3FREE, THYROIDAB in the last 72 hours. Anemia Panel: No results for input(s): VITAMINB12, FOLATE, FERRITIN, TIBC, IRON, RETICCTPCT in the last 72 hours. Urine analysis:    Component Value Date/Time   COLORURINE COLORLESS (A) 11/28/2019 0327   APPEARANCEUR CLEAR 11/28/2019 0327   LABSPEC 1.008 11/28/2019 0327   PHURINE 9.0 (H) 11/28/2019 0327   GLUCOSEU NEGATIVE 11/28/2019 0327   HGBUR LARGE (A) 11/28/2019 0327   BILIRUBINUR NEGATIVE 11/28/2019 0327   KETONESUR 20 (A) 11/28/2019 0327   PROTEINUR NEGATIVE 11/28/2019 0327   UROBILINOGEN 0.2 07/31/2014 1612   NITRITE NEGATIVE 11/28/2019 0327   LEUKOCYTESUR NEGATIVE 11/28/2019 0327    Radiological Exams on Admission: DG Chest 1 View  Result Date: 02/23/2021 CLINICAL DATA:  Preoperative for hip fracture EXAM: CHEST  1 VIEW COMPARISON:  07/28/2016 chest radiograph. FINDINGS: Stable cardiomediastinal silhouette with normal heart size. No pneumothorax. No pleural effusion. Lungs appear clear, with no acute consolidative airspace disease and no pulmonary edema. IMPRESSION: No active disease. Electronically Signed   By: Ilona Sorrel M.D.   On: 02/23/2021 12:43   DG HIP UNILAT WITH PELVIS 2-3  VIEWS RIGHT  Result Date: 02/23/2021 Clinical:  [place] out of top bunk, hip pain right X-rays were done of the right hip, three views. There is an incomplete intertrochanteric fracture of the right hip.  Fracture line just goes to lesser trochanter but not through the cortex there.  Hip is located.  Bone quality is good. Impression:  Incomplete intertrochanteric fracture of the right hip nondisplaced. Electronically Signed Sanjuana Kava, MD 1/19/20238:35 AM    EKG: Independently reviewed. Normal sinus rhythm   Assessment/Plan Principal Problem:   Hip fracture, right (HCC) Active Problems:   History of B-cell lymphoma   GERD (gastroesophageal reflux disease)   Cigarette smoker   Neuropathic pain   Neuropathy   Poor dentition   Anemia in chronic illness   Acquired hypothyroidism   Hepatitis C antibody positive in blood    Nondisplaced right hip fracture  - patient is in great deal of pain - bed rest recommended for now - consulted to Dr. Amedeo Kinsman for orthopedics  - NPO after midnight - planning for OR 1/20 - tranexamic acid IV ordered  - sars 2 coronavirus test negative / Influenza tests negative  - EKG reviewed: normal sinus rhythm  - patient evaluated and felt appropriate medically to proceed with orthopedic surgery as scheduled - IV pain management ordered per hip surgery protocol     DVT prophylaxis: sq heparin   Code Status: full   Family Communication: n/a   Disposition Plan: anticipate home with home health   Consults called: orthopedics   Admission status: Inpatient   Level of care: Med-Surg Irwin Brakeman MD [facility] How to contact the [facility] Attending or Consulting provider [place] or covering provider during after hours 7P -7A, for this patient?  Check the care team in [facility] and look for a) attending/consulting [facility] provider listed and b) the [facility] team listed Log into [email or web address] and use [place]'s universal password to access. If you do not have the  password, please contact the hospital operator. Locate the [facility] provider you are looking for under [facility] and page to a number that you can be directly reached. If you still have difficulty reaching the provider, please page the [facility] (Director on Call) for the Hospitalists listed on amion for assistance.   If 7PM-7AM, please  contact night-coverage [email or web address] Password [facility]  02/23/2021, 2:33 PM

## 2021-02-23 NOTE — ED Provider Notes (Signed)
[facility] EMERGENCY DEPARTMENT Provider Note   CSN: [id number] Arrival date & time: 02/23/21  1046     History  Chief Complaint  Patient presents with   Hip Pain    Nicholas King is a 51 y.o. male presenting for evaluation of right hip pain.  Patient states 5 days ago, on the 12th, he excellently fell off the top bunk while in jail, landing on his right hip.  He did hit his head, but did not lose consciousness.  He was seen by orthopedics today, had an x-ray which showed an intertrochanteric hip fracture and he was instructed to come to the ER for surgery.  He states he has had severe pain in the right hip since the incident.  He denies headache, neck pain, back pain.  No chest pain or shortness of breath.  He does not currently take any medications.  He has taken ibuprofen, but not anything else for his pain.  He is not on blood thinners  HPI     Home Medications Prior to Admission medications   Medication Sig Start Date End Date Taking? Authorizing Provider  ibuprofen (ADVIL) 800 MG tablet Take 1 tablet (800 mg total) by mouth every 8 (eight) hours as needed. Patient not taking: Reported on 02/23/2021 07/09/20   Milton Ferguson, MD      Allergies    Vancomycin, Cymbalta [duloxetine hcl], Penicillins, Amoxicillin, and Hydrocodone    Review of Systems   Review of Systems  Musculoskeletal:  Positive for arthralgias.  All other systems reviewed and are negative.  Physical Exam Updated Vital Signs BP (!) 139/100    Pulse 88    Temp 98.3 F (36.8 C) (Oral)    Resp 20    Ht 5\' 7"  (1.702 m)    Wt 61.2 kg    SpO2 99%    BMI 21.14 kg/m  Physical Exam Vitals and nursing note reviewed.  Constitutional:      General: He is not in acute distress.    Appearance: Normal appearance.  HENT:     Head: Normocephalic and atraumatic.  Eyes:     Conjunctiva/sclera: Conjunctivae normal.     Pupils: Pupils are equal, round, and reactive to light.  Cardiovascular:     Rate and Rhythm:  Normal rate and regular rhythm.     Pulses: Normal pulses.  Pulmonary:     Effort: Pulmonary effort is normal. No respiratory distress.     Breath sounds: Normal breath sounds. No wheezing.     Comments: Speaking in full sentences.  Clear lung sounds in all fields. Abdominal:     General: There is no distension.     Palpations: Abdomen is soft. There is no mass.     Tenderness: There is no abdominal tenderness. There is no guarding or rebound.  Musculoskeletal:        General: Tenderness present. Normal range of motion.     Cervical back: Normal range of motion and neck supple.     Comments: Ttp of R hip. Pedal pulse 2+  Skin:    General: Skin is warm and dry.     Capillary Refill: Capillary refill takes less than 2 seconds.  Neurological:     Mental Status: He is alert and oriented to person, place, and time.  Psychiatric:        Mood and Affect: Mood and affect normal.        Speech: Speech normal.        Behavior: Behavior  normal.    ED Results / Procedures / Treatments   Labs (all labs ordered are listed, but only abnormal results are displayed) Labs Reviewed  CBC WITH DIFFERENTIAL/PLATELET - Abnormal; Notable for the following components:      Result Value   Lymphs Abs 0.6 (*)    All other components within normal limits  BASIC METABOLIC PANEL - Abnormal; Notable for the following components:   Glucose, Bld 110 (*)    BUN 21 (*)    All other components within normal limits  RESP PANEL BY RT-PCR (FLU A&B, COVID) ARPGX2  RAPID URINE DRUG SCREEN, HOSP PERFORMED    EKG EKG Interpretation  Date/Time:  Thursday February 23 2021 13:44:34 EST Ventricular Rate:  90 PR Interval:  139 QRS Duration: 86 QT Interval:  335 QTC Calculation: 410 R Axis:   78 Text Interpretation: Sinus rhythm ST elev, probable normal early repol pattern No significant change since last tracing Confirmed by Dorie Rank [phone]) on 02/23/2021 1:50:43 PM  Radiology DG Chest 1 View  Result Date:  02/23/2021 CLINICAL DATA:  Preoperative for hip fracture EXAM: CHEST  1 VIEW COMPARISON:  07/28/2016 chest radiograph. FINDINGS: Stable cardiomediastinal silhouette with normal heart size. No pneumothorax. No pleural effusion. Lungs appear clear, with no acute consolidative airspace disease and no pulmonary edema. IMPRESSION: No active disease. Electronically Signed   By: Ilona Sorrel M.D.   On: 02/23/2021 12:43   DG HIP UNILAT WITH PELVIS 2-3 VIEWS RIGHT  Result Date: 02/23/2021 Clinical:  [place] out of top bunk, hip pain right X-rays were done of the right hip, three views. There is an incomplete intertrochanteric fracture of the right hip.  Fracture line just goes to lesser trochanter but not through the cortex there.  Hip is located.  Bone quality is good. Impression:  Incomplete intertrochanteric fracture of the right hip nondisplaced. Electronically Signed Sanjuana Kava, MD 1/19/20238:35 AM    Procedures Procedures    Medications Ordered in ED Medications  oxyCODONE-acetaminophen (PERCOCET/ROXICET) 5-325 MG per tablet 1 tablet (1 tablet Oral Given 02/23/21 1340)    ED Course/ Medical Decision Making/ A&P                           Medical Decision Making Amount and/or Complexity of Data Reviewed Labs: ordered. Radiology: ordered.  Risk Prescription drug management. Decision regarding hospitalization.    This patient presents to the ED for concern of R hip pain. This involves a number of treatment options, and is a complaint that carries with it a moderate risk of complications and morbidity.  The differential diagnosis includes hip fx, MSK pain.   Additional history: I reviewed OP xray from today which shows an intertrochanteric fx.    Lab Tests:  I ordered, and personally interpreted labs.  The pertinent results include:  stable hgb   Cardiac Monitoring:  The patient was maintained on a cardiac monitor.  I personally viewed and interpreted the cardiac monitored which  showed an underlying rhythm of: nsr   Medicines ordered:  I ordered medication including norco for pain  Consults:  I requested consultation with the on call orthopedic provider, Dr. Amedeo Kinsman. He recommends admission to medicine with plan for surgery tomorrow.   Disposition:  After consideration of the diagnostic results and the patients response to treatment, I feel that the patent would benefit from admission for management of his R hip fx.   Discussed with Dr. Wynetta Emery from [facility], patient  to be admitted  Final Clinical Impression(s) / ED Diagnoses Final diagnoses:  Closed fracture of right hip, initial encounter [facility])  Fall, initial encounter    Rx / DC Orders ED Discharge Orders     None         Franchot Heidelberg, PA-C 02/23/21 1439    Dorie Rank, MD 02/24/21 [phone]

## 2021-02-23 NOTE — ED Triage Notes (Signed)
Pt reports he was in jail last week and fell off of top bunk.  Reports had an xray at the jail but was never told the result.  Says got out of jail today and went to Dr. Luna Glasgow.  Says Dr. Luna Glasgow told him his r hip was broken and needed surgery within a couple of days.

## 2021-02-24 ENCOUNTER — Encounter (HOSPITAL_COMMUNITY)
Admission: EM | Disposition: A | Discharge status: Home Health | Payer: Self-pay | Source: Home / Self Care | Attending: Family Medicine

## 2021-02-24 ENCOUNTER — Inpatient Hospital Stay (HOSPITAL_COMMUNITY): Payer: Medicare Other

## 2021-02-24 ENCOUNTER — Inpatient Hospital Stay (HOSPITAL_COMMUNITY): Payer: Medicare Other | Admitting: Certified Registered"

## 2021-02-24 ENCOUNTER — Encounter (HOSPITAL_COMMUNITY): Payer: Self-pay | Admitting: Family Medicine

## 2021-02-24 DIAGNOSIS — E559 Vitamin D deficiency, unspecified: Secondary | ICD-10-CM | POA: Diagnosis present

## 2021-02-24 DIAGNOSIS — S72001A Fracture of unspecified part of neck of right femur, initial encounter for closed fracture: Secondary | ICD-10-CM

## 2021-02-24 DIAGNOSIS — S72001D Fracture of unspecified part of neck of right femur, subsequent encounter for closed fracture with routine healing: Secondary | ICD-10-CM

## 2021-02-24 HISTORY — PX: INTRAMEDULLARY (IM) NAIL INTERTROCHANTERIC: SHX5875

## 2021-02-24 LAB — RAPID URINE DRUG SCREEN, HOSP PERFORMED
Amphetamines: NOT DETECTED
Barbiturates: NOT DETECTED
Benzodiazepines: NOT DETECTED
Cocaine: NOT DETECTED
Opiates: POSITIVE — AB
Tetrahydrocannabinol: NOT DETECTED

## 2021-02-24 LAB — CBC
HCT: 38.6 % — ABNORMAL LOW (ref 39.0–52.0)
Hemoglobin: 12.4 g/dL — ABNORMAL LOW (ref 13.0–17.0)
MCH: 28.1 pg (ref 26.0–34.0)
MCHC: 32.1 g/dL (ref 30.0–36.0)
MCV: 87.5 fL (ref 80.0–100.0)
Platelets: 256 10*3/uL (ref 150–400)
RBC: 4.41 MIL/uL (ref 4.22–5.81)
RDW: 13.7 % (ref 11.5–15.5)
WBC: 4 10*3/uL (ref 4.0–10.5)
nRBC: 0 % (ref 0.0–0.2)

## 2021-02-24 LAB — BASIC METABOLIC PANEL
Anion gap: 9 (ref 5–15)
BUN: 16 mg/dL (ref 6–20)
CO2: 28 mmol/L (ref 22–32)
Calcium: 8.9 mg/dL (ref 8.9–10.3)
Chloride: 101 mmol/L (ref 98–111)
Creatinine, Ser: 0.72 mg/dL (ref 0.61–1.24)
GFR, Estimated: >60 mL/min (ref >60)
Glucose, Bld: 93 mg/dL (ref 70–99)
Potassium: 3.7 mmol/L (ref 3.5–5.1)
Sodium: 138 mmol/L (ref 135–145)

## 2021-02-24 LAB — HIV ANTIBODY (ROUTINE TESTING W REFLEX): HIV Screen 4th Generation wRfx: NONREACTIVE

## 2021-02-24 LAB — VITAMIN D 25 HYDROXY (VIT D DEFICIENCY, FRACTURES): Vit D, 25-Hydroxy: 19.24 ng/mL — ABNORMAL LOW (ref 30–100)

## 2021-02-24 LAB — SURGICAL PCR SCREEN
MRSA, PCR: NEGATIVE
Staphylococcus aureus: NEGATIVE

## 2021-02-24 LAB — MAGNESIUM: Magnesium: 2 mg/dL (ref 1.7–2.4)

## 2021-02-24 SURGERY — FIXATION, FRACTURE, INTERTROCHANTERIC, WITH INTRAMEDULLARY ROD
Anesthesia: General | Site: Hip | Laterality: Right

## 2021-02-24 MED ORDER — PHENYLEPHRINE HCL (PRESSORS) 10 MG/ML IV SOLN
INTRAVENOUS | Status: AC
  Filled 2021-02-24: qty 1

## 2021-02-24 MED ORDER — MIDAZOLAM HCL 5 MG/5ML IJ SOLN
INTRAMUSCULAR | Status: DC | PRN
Start: 2021-02-24 — End: 2021-02-24
  Administered 2021-02-24: 2 mg via INTRAVENOUS

## 2021-02-24 MED ORDER — PROPOFOL 10 MG/ML IV BOLUS
INTRAVENOUS | Status: AC
  Filled 2021-02-24: qty 20

## 2021-02-24 MED ORDER — CLINDAMYCIN PHOSPHATE 900 MG/50ML IV SOLN
900.0000 mg | Freq: Three times a day (TID) | INTRAVENOUS | Status: AC
  Administered 2021-02-24 – 2021-02-25 (×2): 900 mg via INTRAVENOUS
  Filled 2021-02-24 (×2): qty 50

## 2021-02-24 MED ORDER — VITAMIN D (ERGOCALCIFEROL) 1.25 MG (50000 UNIT) PO CAPS: 50000.0000 [IU] | ORAL_CAPSULE | ORAL | Status: DC

## 2021-02-24 MED ORDER — ROCURONIUM BROMIDE 10 MG/ML (PF) SYRINGE
PREFILLED_SYRINGE | INTRAVENOUS | Status: DC | PRN
  Administered 2021-02-24: 60 mg via INTRAVENOUS

## 2021-02-24 MED ORDER — ORAL CARE MOUTH RINSE: 15.0000 mL | Freq: Once | OROMUCOSAL | Status: AC

## 2021-02-24 MED ORDER — PHENYLEPHRINE HCL-NACL 20-0.9 MG/250ML-% IV SOLN
INTRAVENOUS | Status: DC | PRN
Start: 2021-02-24 — End: 2021-02-24
  Administered 2021-02-24: 50 ug/min via INTRAVENOUS

## 2021-02-24 MED ORDER — BUPIVACAINE HCL (PF) 0.5 % IJ SOLN
INTRAMUSCULAR | Status: AC
  Filled 2021-02-24: qty 30

## 2021-02-24 MED ORDER — LIDOCAINE HCL (PF) 1 % IJ SOLN
INTRAMUSCULAR | Status: AC
  Filled 2021-02-24: qty 2

## 2021-02-24 MED ORDER — LIDOCAINE HCL (PF) 2 % IJ SOLN
INTRAMUSCULAR | Status: AC
  Filled 2021-02-24: qty 5

## 2021-02-24 MED ORDER — LACTATED RINGERS IV SOLN: INTRAVENOUS | Status: DC

## 2021-02-24 MED ORDER — CHLORHEXIDINE GLUCONATE 0.12 % MT SOLN
15.0000 mL | Freq: Once | OROMUCOSAL | Status: AC
  Administered 2021-02-24: 15 mL via OROMUCOSAL

## 2021-02-24 MED ORDER — DEXAMETHASONE SODIUM PHOSPHATE 10 MG/ML IJ SOLN
INTRAMUSCULAR | Status: AC
  Filled 2021-02-24: qty 1

## 2021-02-24 MED ORDER — STERILE WATER FOR IRRIGATION IR SOLN
Status: DC | PRN
  Administered 2021-02-24: 1000 mL

## 2021-02-24 MED ORDER — FENTANYL CITRATE (PF) 250 MCG/5ML IJ SOLN
INTRAMUSCULAR | Status: AC
  Filled 2021-02-24: qty 5

## 2021-02-24 MED ORDER — TRANEXAMIC ACID-NACL 1000-0.7 MG/100ML-% IV SOLN
1000.0000 mg | INTRAVENOUS | Status: AC
  Administered 2021-02-24: 1000 mg via INTRAVENOUS
  Filled 2021-02-24: qty 100

## 2021-02-24 MED ORDER — FENTANYL CITRATE (PF) 100 MCG/2ML IJ SOLN
INTRAMUSCULAR | Status: DC | PRN
  Administered 2021-02-24 (×7): 50 ug via INTRAVENOUS

## 2021-02-24 MED ORDER — ONDANSETRON HCL 4 MG/2ML IJ SOLN
INTRAMUSCULAR | Status: AC
  Filled 2021-02-24: qty 2

## 2021-02-24 MED ORDER — DEXMEDETOMIDINE (PRECEDEX) IN NS 20 MCG/5ML (4 MCG/ML) IV SYRINGE
PREFILLED_SYRINGE | INTRAVENOUS | Status: DC | PRN
  Administered 2021-02-24 (×2): 10 ug via INTRAVENOUS

## 2021-02-24 MED ORDER — LIDOCAINE 2% (20 MG/ML) 5 ML SYRINGE
INTRAMUSCULAR | Status: DC | PRN
  Administered 2021-02-24: 60 mg via INTRAVENOUS

## 2021-02-24 MED ORDER — CLINDAMYCIN PHOSPHATE 900 MG/50ML IV SOLN
900.0000 mg | INTRAVENOUS | Status: AC
  Administered 2021-02-24: 900 mg via INTRAVENOUS
  Filled 2021-02-24: qty 50

## 2021-02-24 MED ORDER — MIDAZOLAM HCL 2 MG/2ML IJ SOLN
INTRAMUSCULAR | Status: AC
  Filled 2021-02-24: qty 2

## 2021-02-24 MED ORDER — BUPIVACAINE HCL (PF) 0.5 % IJ SOLN
INTRAMUSCULAR | Status: DC | PRN
  Administered 2021-02-24: 30 mL

## 2021-02-24 MED ORDER — PROPOFOL 10 MG/ML IV BOLUS
INTRAVENOUS | Status: DC | PRN
Start: 2021-02-24 — End: 2021-02-24
  Administered 2021-02-24: 140 mg via INTRAVENOUS

## 2021-02-24 MED ORDER — CHLORHEXIDINE GLUCONATE CLOTH 2 % EX PADS
6.0000 | MEDICATED_PAD | Freq: Every day | CUTANEOUS | Status: DC
  Administered 2021-02-24: 6 via TOPICAL

## 2021-02-24 MED ORDER — COVID-19 MRNA VACC (MODERNA) 100 MCG/0.5ML IM SUSP
0.5000 mL | Freq: Once | INTRAMUSCULAR | Status: AC
  Administered 2021-02-25: 0.5 mL via INTRAMUSCULAR
  Filled 2021-02-24: qty 0.5

## 2021-02-24 MED ORDER — PHENYLEPHRINE 40 MCG/ML (10ML) SYRINGE FOR IV PUSH (FOR BLOOD PRESSURE SUPPORT)
PREFILLED_SYRINGE | INTRAVENOUS | Status: DC | PRN
  Administered 2021-02-24 (×5): 80 ug via INTRAVENOUS

## 2021-02-24 MED ORDER — ONDANSETRON HCL 4 MG/2ML IJ SOLN: 4.0000 mg | Freq: Once | INTRAMUSCULAR | Status: DC | PRN

## 2021-02-24 MED ORDER — SUGAMMADEX SODIUM 200 MG/2ML IV SOLN
INTRAVENOUS | Status: DC | PRN
  Administered 2021-02-24: 200 mg via INTRAVENOUS

## 2021-02-24 MED ORDER — SODIUM CHLORIDE 0.9 % IR SOLN
Status: DC | PRN
  Administered 2021-02-24 (×2): 1000 mL

## 2021-02-24 MED ORDER — FENTANYL CITRATE (PF) 100 MCG/2ML IJ SOLN
INTRAMUSCULAR | Status: AC
  Filled 2021-02-24: qty 2

## 2021-02-24 MED ORDER — DEXAMETHASONE SODIUM PHOSPHATE 10 MG/ML IJ SOLN
INTRAMUSCULAR | Status: DC | PRN
  Administered 2021-02-24: 10 mg via INTRAVENOUS

## 2021-02-24 MED ORDER — DEXMEDETOMIDINE (PRECEDEX) IN NS 20 MCG/5ML (4 MCG/ML) IV SYRINGE
PREFILLED_SYRINGE | INTRAVENOUS | Status: AC
  Filled 2021-02-24: qty 10

## 2021-02-24 MED ORDER — FENTANYL CITRATE PF 50 MCG/ML IJ SOSY
25.0000 ug | PREFILLED_SYRINGE | INTRAMUSCULAR | Status: DC | PRN
  Administered 2021-02-24 (×2): 50 ug via INTRAVENOUS
  Filled 2021-02-24 (×2): qty 1

## 2021-02-24 MED ORDER — ONDANSETRON HCL 4 MG/2ML IJ SOLN
INTRAMUSCULAR | Status: DC | PRN
  Administered 2021-02-24: 4 mg via INTRAVENOUS

## 2021-02-24 SURGICAL SUPPLY — 59 items
APL PRP STRL LF DISP 70% ISPRP (MISCELLANEOUS) ×1
BIT DRILL 4.0X280 (BIT) ×1 IMPLANT
BLADE SURG SZ10 CARB STEEL (BLADE) ×4 IMPLANT
BNDG GAUZE ELAST 4 BULKY (GAUZE/BANDAGES/DRESSINGS) ×2 IMPLANT
BRUSH SCRUB EZ W/ULTRADEX 3%PC (MISCELLANEOUS) ×1 IMPLANT
CHLORAPREP W/TINT 26 (MISCELLANEOUS) ×2 IMPLANT
CLOTH BEACON ORANGE TIMEOUT ST (SAFETY) ×2 IMPLANT
COVER LIGHT HANDLE STERIS (MISCELLANEOUS) ×4 IMPLANT
COVER MAYO STAND XLG (MISCELLANEOUS) ×2 IMPLANT
COVER PERINEAL POST (MISCELLANEOUS) ×2 IMPLANT
DECANTER SPIKE VIAL GLASS SM (MISCELLANEOUS) ×2 IMPLANT
DRAPE HALF SHEET 40X57 (DRAPES) ×2 IMPLANT
DRAPE INCISE IOBAN 44X35 STRL (DRAPES) ×1 IMPLANT
DRAPE STERI IOBAN 125X83 (DRAPES) ×2 IMPLANT
DRSG TEGADERM 4.75X4.75 WINDOW (GAUZE/BANDAGES/DRESSINGS) ×4 IMPLANT
DRSG TEGADERM 4X4.75 (GAUZE/BANDAGES/DRESSINGS) ×8 IMPLANT
DRSG XEROFORM 1X8 (GAUZE/BANDAGES/DRESSINGS) ×1 IMPLANT
ELECT REM PT RETURN 9FT ADLT (ELECTROSURGICAL) ×2
ELECTRODE REM PT RTRN 9FT ADLT (ELECTROSURGICAL) ×1 IMPLANT
GAUZE SPONGE 4X4 12PLY STRL (GAUZE/BANDAGES/DRESSINGS) ×2 IMPLANT
GAUZE XEROFORM 1X8 LF (GAUZE/BANDAGES/DRESSINGS) ×3 IMPLANT
GLOVE SRG 8 PF TXTR STRL LF DI (GLOVE) ×1 IMPLANT
GLOVE SURG POLYISO LF SZ8 (GLOVE) ×4 IMPLANT
GLOVE SURG UNDER POLY LF SZ7 (GLOVE) ×4 IMPLANT
GLOVE SURG UNDER POLY LF SZ8 (GLOVE) ×2
GOWN STRL REUS W/ TWL XL LVL3 (GOWN DISPOSABLE) ×1 IMPLANT
GOWN STRL REUS W/TWL LRG LVL3 (GOWN DISPOSABLE) ×2 IMPLANT
GOWN STRL REUS W/TWL XL LVL3 (GOWN DISPOSABLE) ×2
GUIDE PIN 3.2X330 (PIN) ×2
GUIDEWIRE BALL NOSE 3.0X900 (WIRE) ×2
GUIDEWIRE ORTH 900X3XBALL NOSE (WIRE) IMPLANT
INST SET MAJOR BONE (KITS) ×2 IMPLANT
KIT BLADEGUARD II DBL (SET/KITS/TRAYS/PACK) ×2 IMPLANT
KIT TURNOVER CYSTO (KITS) ×2 IMPLANT
MANIFOLD NEPTUNE II (INSTRUMENTS) ×2 IMPLANT
MARKER SKIN DUAL TIP RULER LAB (MISCELLANEOUS) ×2 IMPLANT
NAIL IT ES RT FEM 11X130X36 (Nail) ×1 IMPLANT
NDL HYPO 21X1.5 SAFETY (NEEDLE) ×1 IMPLANT
NEEDLE HYPO 21X1.5 SAFETY (NEEDLE) ×2 IMPLANT
NS IRRIG 1000ML POUR BTL (IV SOLUTION) ×3 IMPLANT
PACK BASIC III (CUSTOM PROCEDURE TRAY) ×2
PACK SRG BSC III STRL LF ECLPS (CUSTOM PROCEDURE TRAY) ×1 IMPLANT
PAD ABD 5X9 TENDERSORB (GAUZE/BANDAGES/DRESSINGS) ×1 IMPLANT
PAD ARMBOARD 7.5X6 YLW CONV (MISCELLANEOUS) ×2 IMPLANT
PENCIL SMOKE EVACUATOR COATED (MISCELLANEOUS) ×2 IMPLANT
PIN GUIDE 3.2X330 (PIN) IMPLANT
SCREW LOCK CORT 5X36 (Screw) ×1 IMPLANT
SCREW LOCK LAG 10.5X95 GALILEO (Screw) ×1 IMPLANT
SET BASIN LINEN APH (SET/KITS/TRAYS/PACK) ×2 IMPLANT
SPONGE T-LAP 18X18 ~~LOC~~+RFID (SPONGE) ×4 IMPLANT
SUT MON AB 2-0 CT1 36 (SUTURE) ×2 IMPLANT
SUT VIC AB 0 CT1 27 (SUTURE) ×2
SUT VIC AB 0 CT1 27XBRD ANTBC (SUTURE) ×1 IMPLANT
SYR 30ML LL (SYRINGE) ×3 IMPLANT
SYR BULB IRRIG 60ML STRL (SYRINGE) ×4 IMPLANT
TOOL ACTIVATION (INSTRUMENTS) ×1 IMPLANT
TRAY FOLEY MTR SLVR 16FR STAT (SET/KITS/TRAYS/PACK) ×2 IMPLANT
WATER STERILE IRR 1000ML POUR (IV SOLUTION) ×1 IMPLANT
YANKAUER SUCT BULB TIP NO VENT (SUCTIONS) ×2 IMPLANT

## 2021-02-24 NOTE — Progress Notes (Addendum)
PROGRESS NOTE   GEVON MARKUS  NLZ:[id number] DOB: 09/09/1970 DOA: 02/23/2021 PCP: Pcp, No   Chief Complaint  Patient presents with   Hip Pain   Level of care: Med-Surg  Brief Admission History:  51 y.o. male with medical history significant for B-cell lymphoma who was recently incarcerated (just released today) unfortunately fell out of a bunk bed while incarcerated 1 week ago (02/16/21).  He reportedly did hit his head and fell on right hip but did not lose consciousness.  He lay in bed unable to bear weight for several days in severe pain.  He was finally sent for xrays but reportedly never was informed of the results.  He was told by prison staff that they were taking him to see an orthopedist today.  He saw Dr. Luna Glasgow who got xrays that revealed intertrochanteric right hip fracture nondisplaced.  He reports that he was told his prison sentence was adjusted and he was released from custody today.  He served about 133 days out of a 200 day sentence for a larceny misdemeanor.  Dr. Luna Glasgow made arrangements for him to come to the ED for treatment.  Dr. Amedeo Kinsman orthopedist on call was consulted and agreed to see patient with plans for operative management in the next 24 hours.    Assessment & Plan:   Principal Problem:   Hip fracture, right (HCC) Active Problems:   History of B-cell lymphoma   GERD (gastroesophageal reflux disease)   Cigarette smoker   Neuropathic pain   Neuropathy   Poor dentition   Anemia in chronic illness   Acquired hypothyroidism   Hepatitis C antibody positive in blood   Right hip intertrochanteric hip fracture - continue pain management as ordered - orthopedics planning ORIF later today 02/24/21 - continue supportive measures    Vitamin D deficiency - starting Drisdol 50k IU caps once weekly    DVT prophylaxis: sq hep Code Status: full  Family Communication: patient  Disposition: home with [facility] [place] Status is: Inpatient  Remains inpatient appropriate  because: IV pain management    Consultants:  Dr. Amedeo Kinsman orthopedics   Procedures:  Tentative plan for ORIF 1/20 with Dr. Amedeo Kinsman  Antimicrobials:  Cleocin preop 1/20 x 1 dose   Subjective: Pt reports ongoing pain in right hip.    Objective: Vitals:   02/23/21 1508 02/23/21 1511 02/23/21 2209 02/24/21 0329  BP:  127/82 108/90 108/77  Pulse:  83 85 65  Resp:  17 19 18   Temp: 98.4 F (36.9 C) 97.9 F (36.6 C) 98 F (36.7 C) 97.9 F (36.6 C)  TempSrc: Oral Oral  Oral  SpO2:  100% 97% 98%  Weight:      Height:        Intake/Output Summary (Last 24 hours) at 02/24/2021 0843 Last data filed at 02/24/2021 9024 Gross per 24 hour  Intake 636.13 ml  Output 1000 ml  Net -363.87 ml   Filed Weights   02/23/21 1155  Weight: 61.2 kg    Examination:  General exam: Appears calm and comfortable  Respiratory system: Clear to auscultation. Respiratory effort normal. Cardiovascular system: normal S1 & S2 heard. No JVD, murmurs, rubs, gallops or clicks. No pedal edema. Gastrointestinal system: Abdomen is nondistended, soft and nontender. No organomegaly or masses felt. Normal bowel sounds heard. Central nervous system: Alert and oriented. No focal neurological deficits. Extremities: 2+ DP pulses, sensation intact.  Skin: No rashes, lesions or ulcers Psychiatry: Judgement and insight appear normal. Mood & affect appropriate.  Data Reviewed: I have personally reviewed following labs and imaging studies  CBC: Recent Labs  Lab 02/23/21 1240 02/24/21 0505  WBC 7.6 4.0  NEUTROABS 6.6  --   HGB 13.6 12.4*  HCT 41.2 38.6*  MCV 86.6 87.5  PLT 248 379    Basic Metabolic Panel: Recent Labs  Lab 02/23/21 1240 02/24/21 0505  NA 139 138  K 3.5 3.7  CL 105 101  CO2 22 28  GLUCOSE 110* 93  BUN 21* 16  CREATININE 0.75 0.72  CALCIUM 9.0 8.9  MG  --  2.0    GFR: Estimated Creatinine Clearance: 95.6 mL/min (by C-G formula based on SCr of 0.72 mg/dL).  Liver Function  Tests: No results for input(s): AST, ALT, ALKPHOS, BILITOT, PROT, ALBUMIN in the last 168 hours.  CBG: No results for input(s): GLUCAP in the last 168 hours.  Recent Results (from the past 240 hour(s))  Resp Panel by RT-PCR (Flu A&B, Covid) Nasopharyngeal Swab     Status: None   Collection Time: 02/23/21  2:12 PM   Specimen: Nasopharyngeal Swab; Nasopharyngeal(NP) swabs in vial transport medium  Result Value Ref Range Status   SARS Coronavirus 2 by RT PCR NEGATIVE NEGATIVE Final    Comment: (NOTE) SARS-CoV-2 target nucleic acids are NOT DETECTED.  The SARS-CoV-2 RNA is generally detectable in upper respiratory specimens during the acute phase of infection. The lowest concentration of SARS-CoV-2 viral copies this assay can detect is 138 copies/mL. A negative result does not preclude SARS-Cov-2 infection and should not be used as the sole basis for treatment or other patient management decisions. A negative result may occur with  improper specimen collection/handling, submission of specimen other than nasopharyngeal swab, presence of viral mutation(s) within the areas targeted by this assay, and inadequate number of viral copies(<138 copies/mL). A negative result must be combined with clinical observations, patient history, and epidemiological information. The expected result is Negative.  Fact Sheet for Patients:  EntrepreneurPulse.com.au  Fact Sheet for Healthcare Providers:  IncredibleEmployment.be  This test is no t yet approved or cleared by the [place] FDA and  has been authorized for detection and/or diagnosis of SARS-CoV-2 by FDA under an Emergency Use Authorization (EUA). This EUA will remain  in effect (meaning this test can be used) for the duration of the COVID-19 declaration under Section 564(b)(1) of the Act, 21 U.S.C.section 360bbb-3(b)(1), unless the authorization is terminated  or revoked sooner.       Influenza A by  PCR NEGATIVE NEGATIVE Final   Influenza B by PCR NEGATIVE NEGATIVE Final    Comment: (NOTE) The Xpert Xpress SARS-CoV-2/FLU/RSV plus assay is intended as an aid in the diagnosis of influenza from Nasopharyngeal swab specimens and should not be used as a sole basis for treatment. Nasal washings and aspirates are unacceptable for Xpert Xpress SARS-CoV-2/FLU/RSV testing.  Fact Sheet for Patients: EntrepreneurPulse.com.au  Fact Sheet for Healthcare Providers: IncredibleEmployment.be  This test is not yet approved or cleared by the [place] FDA and has been authorized for detection and/or diagnosis of SARS-CoV-2 by FDA under an Emergency Use Authorization (EUA). This EUA will remain in effect (meaning this test can be used) for the duration of the COVID-19 declaration under Section 564(b)(1) of the Act, 21 U.S.C. section 360bbb-3(b)(1), unless the authorization is terminated or revoked.  Performed at [facility], [address], [place], [place] [address]   Surgical PCR screen     Status: None   Collection Time: 02/24/21  1:08 AM  Specimen: Urine, Clean Catch; Nasal Swab  Result Value Ref Range Status   MRSA, PCR NEGATIVE NEGATIVE Final   Staphylococcus aureus NEGATIVE NEGATIVE Final    Comment: (NOTE) The Xpert SA Assay (FDA approved for NASAL specimens in patients 80 years of age and older), is one component of a comprehensive surveillance program. It is not intended to diagnose infection nor to guide or monitor treatment. Performed at [facility], [address], [place], [place] [address]      Radiology Studies: DG Chest 1 View  Result Date: 02/23/2021 CLINICAL DATA:  Preoperative for hip fracture EXAM: CHEST  1 VIEW COMPARISON:  07/28/2016 chest radiograph. FINDINGS: Stable cardiomediastinal silhouette with normal heart size. No pneumothorax. No pleural effusion. Lungs appear clear, with no acute consolidative airspace disease and no  pulmonary edema. IMPRESSION: No active disease. Electronically Signed   By: Ilona Sorrel M.D.   On: 02/23/2021 12:43   DG HIP UNILAT WITH PELVIS 2-3 VIEWS RIGHT  Result Date: 02/23/2021 Clinical:  [place] out of top bunk, hip pain right X-rays were done of the right hip, three views. There is an incomplete intertrochanteric fracture of the right hip.  Fracture line just goes to lesser trochanter but not through the cortex there.  Hip is located.  Bone quality is good. Impression:  Incomplete intertrochanteric fracture of the right hip nondisplaced. Electronically Signed Sanjuana Kava, MD 1/19/20238:35 AM    Scheduled Meds:  chlorhexidine  15 mL Mouth/Throat Once   Or   mouth rinse  15 mL Mouth Rinse Once   pantoprazole  40 mg Oral QPM   Continuous Infusions:  clindamycin (CLEOCIN) IV     lactated ringers     tranexamic acid       LOS: 1 day   Time spent: 35 mins   Tiffney Haughton Wynetta Emery, MD How to contact the [facility] Attending or Consulting provider [place] or covering provider during after hours [place], for this patient?  Check the care team in [facility] and look for a) attending/consulting [facility] provider listed and b) the [facility] team listed Log into [email or web address] and use [place]'s universal password to access. If you do not have the password, please contact the hospital operator. Locate the [facility] provider you are looking for under [facility] and page to a number that you can be directly reached. If you still have difficulty reaching the provider, please page the [facility] (Director on Call) for the Hospitalists listed on amion for assistance.  02/24/2021, 8:43 AM

## 2021-02-24 NOTE — Op Note (Signed)
Orthopaedic Surgery Operative Note (CSN: [id number])  Nicholas King  24-Aug-1970 Date of Surgery: 02/23/2021 - 02/24/2021   Diagnoses:  Right intertrochanteric femur fracture  Procedure: Cephalomedullary nail for Right intertrochanteric femur fracture   Operative Finding Successful completion of the planned procedure.  Placement of cephalomedullary nail, no issues. 11 mm x 360 mm x 130 degree nail.    Post-Op Diagnosis: Same Surgeons:Primary: Mordecai Rasmussen, MD Assistants: N/A Location: [facility] OR ROOM 3 Anesthesia: General with local anesthesia Antibiotics: Ancef 2 g Tourniquet time: N/A Estimated Blood Loss: 865 cc Complications: None Specimens: None Implants: Implant Name Type Inv. Item Serial No. Manufacturer Lot No. LRB No. Used Action  [id number] Es Troch Nail    [employer] STERILE ON SET Right 1 Implanted  SCREW LOCK LAG 10.5X95 GALILEO - [id number] Screw SCREW LOCK LAG 10.5X95 GALILEO  [employer] ON SET Right 1 Implanted  SCREW LOCK CORT 5X36 - [id number] Screw SCREW LOCK CORT 5X36  [employer] STERILE ON SET Right 1 Implanted    Indications for Surgery:   Nicholas King is a 51 y.o. male who had a fall from a height and landed on his right hip, and sustained a Right intertrochanteric femur fracture.  I recommended operative fixation to restore stability and allow the patient to ambulate immediately postop.  Benefits and risks of operative and nonoperative management were discussed prior to surgery with patient/guardian(s) and informed consent form was completed.  Specific risks including infection, need for additional surgery, persistent pain, bleeding, malunion, nonunion and more severe complications associated with anesthesia.  The patient/guardian elected to proceed and surgical consent was obtained.    Procedure:   The patient was identified properly. Informed consent was obtained and the surgical site was marked. The patient was taken to the OR where general  anesthesia was induced.   The patient was placed supine on a fracture table and appropriate reduction was obtained and visualized on fluoroscopy prior to the beginning of the procedure.  Timeout was performed before the beginning of the case.  Ancef 2 g dosing was confirmed prior to making incision.  The patient received TXA prior to the start of surgery.   We made an incision proximal to the greater trochanter and dissected down through the fascia.  We then carefully placed a guidepin, localizing under fluoroscopy.  Once satisfied with the starting point, the entry reamer was used to gain entry into the intramedullary canal.  A ball tip guidewire was then introduced and passed to an appropriate level at the physeal scar of the distal femur and measurement was obtained proximally using fluoroscopy.  We selected the appropriate length of nail, as noted above.  Entry reamer was used.  Given his age and bone quality, we did have to ream up to 12.5 mm.  At this point we placed our nail localizing under fluoroscopy, and confirmed that it was at the appropriate level.  Next we used the outrigger device to pass a wire into the femoral neck, and then the cephalomedullary lag screw.  The screw was locked proximally to avoid over collapse.  We then turned our attention to the distal interlocking screw.  Once again, we used the outrigger device to place a single interlocking screw in the midshaft area.  The outrigger device was removed and final fluoroscopic images were obtained.  The wounds were thoroughly irrigated closed in a multilayer fashion with 0 vicryl, 2-0 monocryl and staples.  Sterile dressings were placed.  The patient was  awoken from general anesthesia and taken to the PACU in stable condition without complication.     Post-operative plan:  Weightbearing: The patient will be WBAT on the operative extremity.   DVT prophylaxis per primary team, no orthopedic contraindications.  Recommend 81 mg Aspirin  BID, unless patient cannot tolerate or was previously on anticoagulation.  Prefer to start Ppx POD#1 Pain control with PRN pain medication preferring oral medicines.   Dressing can be reinforced as needed, will change on POD#2/3 if needed.  Patient does not need to remain hospitalized for dressing change Follow up plan: approximately 2 weeks postop for incision check and XR.  If patient is discharged to a nursing facility, the staples can be removed around 2 weeks postop.  If staples removed in a facility, follow up can be scheduled at 6 weeks postop.  XR at next visit:  please obtain AP pelvis, and 2 views of the Right hip

## 2021-02-24 NOTE — Anesthesia Procedure Notes (Signed)
Procedure Name: Intubation Date/Time: 02/24/2021 1:09 PM Performed by: Myna Bright, CRNA Pre-anesthesia Checklist: Patient identified, Emergency Drugs available, Suction available and Patient being monitored Patient Re-evaluated:Patient Re-evaluated prior to induction Oxygen Delivery Method: Circle system utilized Preoxygenation: Pre-oxygenation with 100% oxygen Induction Type: IV induction Ventilation: Mask ventilation with difficulty Laryngoscope Size: Mac and 4 Grade View: Grade I Tube type: Oral Tube size: 7.5 mm Number of attempts: 1 Airway Equipment and Method: Stylet Placement Confirmation: ETT inserted through vocal cords under direct vision, positive ETCO2 and breath sounds checked- equal and bilateral Secured at: 22 cm Tube secured with: Tape Dental Injury: Teeth and Oropharynx as per pre-operative assessment

## 2021-02-24 NOTE — Anesthesia Postprocedure Evaluation (Signed)
Anesthesia Post Note  Patient: Nicholas King  Procedure(s) Performed: INTRAMEDULLARY (IM) NAIL INTERTROCHANTRIC (Right: Hip)  Patient location during evaluation: PACU Anesthesia Type: General Level of consciousness: awake and alert Pain management: pain level controlled Vital Signs Assessment: post-procedure vital signs reviewed and stable Respiratory status: spontaneous breathing, nonlabored ventilation, respiratory function stable and patient connected to nasal cannula oxygen Cardiovascular status: blood pressure returned to baseline and stable Postop Assessment: no apparent nausea or vomiting Anesthetic complications: no   No notable events documented.   Last Vitals:  Vitals:   02/24/21 1530 02/24/21 1554  BP: 95/71 106/69  Pulse: 81 78  Resp: (!) 9 18  Temp:  (!) 36.4 C  SpO2: 97% 100%    Last Pain:  Vitals:   02/24/21 1554  TempSrc: Oral  PainSc:   resp 19               Trixie Rude

## 2021-02-24 NOTE — Progress Notes (Signed)
Initial Nutrition Assessment  DOCUMENTATION CODES:   Not applicable  INTERVENTION:  When pt is cleared for oral intake RD will address   NUTRITION DIAGNOSIS:   Inadequate oral intake related to inability to eat as evidenced by NPO status (for hip surgery).   GOAL:  Patient will meet greater than or equal to 90% of their needs   MONITOR:  Diet advancement, Supplement acceptance, PO intake, Weight trends, Skin, Labs  REASON FOR ASSESSMENT:   Consult Assessment of nutrition requirement/status  ASSESSMENT: Patient is a 51 yo male with fall which resulted in right hip fracture. Patient scheduled for surgery this afternoon.   PMH: GERD, lymphoma, chronic back pain.   Patient denies changes in appetite or intake. Usual diet -Regular. Consistently eats 3 meals daily. He likes fruits, vegetables and protein foods. Encouraged meal intake and emphasized the importance of protein with each meal to meet increased needs following surgery.   Patient weights have been trending between 59-61 kg the past 15 months.   Labs reviewed:  BMP Latest Ref Rng & Units 02/24/2021 02/23/2021 11/28/2019  Glucose 70 - 99 mg/dL 93 110(H) 125(H)  BUN 6 - 20 mg/dL 16 21(H) 10  Creatinine 0.61 - 1.24 mg/dL 0.72 0.75 0.81  BUN/Creat Ratio 6 - 22 (calc) - - -  Sodium 135 - 145 mmol/L 138 139 135  Potassium 3.5 - 5.1 mmol/L 3.7 3.5 3.1(L)  Chloride 98 - 111 mmol/L 101 105 98  CO2 22 - 32 mmol/L 28 22 26   Calcium 8.9 - 10.3 mg/dL 8.9 9.0 9.1      NUTRITION - FOCUSED PHYSICAL EXAM:  Flowsheet Row Most Recent Value  Orbital Region Mild depletion  Upper Arm Region No depletion  Thoracic and Lumbar Region No depletion  Buccal Region No depletion  Temple Region Mild depletion  Clavicle Bone Region No depletion  Clavicle and Acromion Bone Region No depletion  Scapular Bone Region No depletion  Dorsal Hand No depletion  Patellar Region No depletion  Anterior Thigh Region No depletion  Posterior Calf  Region No depletion  Edema (RD Assessment) Mild  Hair Reviewed  Eyes Reviewed  Mouth Reviewed  Skin Reviewed  Nails Reviewed       Diet Order:   Diet Order             Diet NPO time specified  Diet effective now                   EDUCATION NEEDS:  Education needs have been addressed  Skin:  Skin Assessment: Reviewed RN Assessment- pending right hip surgery  Last BM:  1/19  Height:   Ht Readings from Last 1 Encounters:  02/23/21 5\' 7"  (1.702 m)    Weight:   Wt Readings from Last 1 Encounters:  02/23/21 61.2 kg    Ideal Body Weight:   67 kg  BMI:  Body mass index is 21.14 kg/m.  Estimated Nutritional Needs:   Kcal:  2000-2200  Protein:  85-92 gr  Fluid:  > 1800 ml daily   Colman Cater MS,RD,CSG,LDN Contact: Shea Evans

## 2021-02-24 NOTE — Consult Note (Addendum)
ORTHOPAEDIC CONSULTATION  REQUESTING PHYSICIAN: Murlean Iba, MD  ASSESSMENT AND PLAN: 51 y.o. male with the following: Right Hip Intertrochanteric femur fracture  This patient requires inpatient admission to the hospitalist, to include preoperative clearance and perioperative medical management  - Weight Bearing Status/Activity: NWB Right lower extremity  - Additional recommended labs/tests: Preop Labs: CBC, BMP, PT/INR, Chest XR, and EKG  -VTE Prophylaxis: Please hold prior to OR; to resume POD#1 at the discretion of the primary team  - Pain control: Recommend PO pain medications PRN; judicious use of narcotics  - Follow-up plan: F/u 10-14 days postop  -Procedures: Plan for OR once patient has been medically optimized  Plan for Right Hip Cephalomedullary nail  Plan for OR in the afternoon 02/24/2021  Chief Complaint: Right hip pain  HPI: Nicholas King is a 51 y.o. male who presented to the ED for evaluation after sustaining a fall.  Patient was incarcerated, and fell off the top bunk, approximately 1 week ago.  He had immediate pain, but essentially dealt with it.  Ultimately, they obtained an x-ray which demonstrated a fracture.  He was seen in clinic yesterday by Dr. Luna Glasgow, who sent him to the ED for further evaluation.  He has pain in the right hip.  No numbness or tingling.  Pain worsens with movement.  If he lays still, he has no issues.  Of note, he was released from prison yesterday.  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[place])    Dysphagia, unspecified(787.20) 05/16/2012   GERD (gastroesophageal reflux disease)    Lumbago 01/06/2013   Lymphoma ([place])    Neuropathic pain 11/06/2012   Neuropathy  12/26/2012   Pain management    Pinched nerve in neck    Pneumonia    April 2014   Post lumbar puncture headache 07/03/2012   Radiation 10/30/12-11/25/12   Bilateral Neck/Tonsil   Radiation 10/30/12-11/25/12   Bilateral neck and tonsils   Past Surgical History:  Procedure Laterality Date   DIRECT LARYNGOSCOPY Left 05/08/2012   Procedure: DIRECT LARYNGOSCOPY with biopsy left tonsil;  Surgeon: Melissa Montane, MD;  Location: [facility] OR;  Service: ENT;  Laterality: Left;   NO PAST SURGERIES     right eyebrown bone     Right frontal skull fracture   Social History   Socioeconomic History   Marital status: Single    Spouse name: Not on file   Number of children: 0   Years of education: 9th grade   Highest education level: Not on file  Occupational History   Occupation: Disability   Tobacco Use   Smoking status: Every Day    Packs/day: 0.50    Years: 25.00    Pack years: 12.50    Types: Cigarettes   Smokeless tobacco: Never  Vaping Use   Vaping Use: Never used  Substance and Sexual Activity   Alcohol use: No   Drug use: Yes    Types: Heroin    Comment: former   Sexual activity: Never  Other Topics Concern   Not on file  Social History Narrative   Not on file   Social Determinants of Health  Financial Resource Strain: Not on file  Food Insecurity: Not on file  Transportation Needs: Not on file  Physical Activity: Not on file  Stress: Not on file  Social Connections: Not on file   Family History  Problem Relation Age of Onset   Cancer Father        lung   Diabetes Father    Cancer Maternal Aunt        gyn cancer, pancreas   Cancer Paternal Aunt        pancreas cancer   Cancer Paternal Uncle        unk   Cancer Sister    Allergies  Allergen Reactions   Vancomycin Itching and Rash    Extreme Itching, red skin from head to torso.     Cymbalta [Duloxetine Hcl] Other (See Comments)    Suicidal and "crazy"   Penicillins Hives    Has patient had a PCN reaction causing  immediate rash, facial/tongue/throat swelling, SOB or lightheadedness with hypotension: no Has patient had a PCN reaction causing severe rash involving mucus membranes or skin necrosis: no Has patient had a PCN reaction that required hospitalization no Has patient had a PCN reaction occurring within the last 10 years: no If all of the above answers are "NO", then may proceed with Cephalosporin use.    Amoxicillin Rash   Hydrocodone Rash   Prior to Admission medications   Not on File   DG Chest 1 View  Result Date: 02/23/2021 CLINICAL DATA:  Preoperative for hip fracture EXAM: CHEST  1 VIEW COMPARISON:  07/28/2016 chest radiograph. FINDINGS: Stable cardiomediastinal silhouette with normal heart size. No pneumothorax. No pleural effusion. Lungs appear clear, with no acute consolidative airspace disease and no pulmonary edema. IMPRESSION: No active disease. Electronically Signed   By: Ilona Sorrel M.D.   On: 02/23/2021 12:43   DG HIP UNILAT WITH PELVIS 2-3 VIEWS RIGHT  Result Date: 02/23/2021 Clinical:  [place] out of top bunk, hip pain right X-rays were done of the right hip, three views. There is an incomplete intertrochanteric fracture of the right hip.  Fracture line just goes to lesser trochanter but not through the cortex there.  Hip is located.  Bone quality is good. Impression:  Incomplete intertrochanteric fracture of the right hip nondisplaced. Electronically Signed Sanjuana Kava, MD 1/19/20238:35 AM     Family History Reviewed and non-contributory, no pertinent history of problems with bleeding or anesthesia    Review of Systems No fevers or chills No numbness or tingling No chest pain No shortness of breath No bowel or bladder dysfunction No GI distress No headaches No blurry vision. No loss of consciousness No loss of appetite No excessive thirst.    OBJECTIVE  Vitals:Patient Vitals for the past 8 hrs:  BP Temp Temp src Pulse Resp SpO2  02/24/21 0329 108/77 97.9 F  (36.6 C) Oral 65 18 98 %   General: Alert, no acute distress Cardiovascular: Extremities are warm Respiratory: No cyanosis, no use of accessory musculature Skin: No lesions in the area of chief complaint  Neurologic: Sensation intact distally  Psychiatric: Patient is competent for consent with normal mood and affect Lymphatic: No swelling obvious and reported other than the area involved in the exam below Extremities  RLE: Extremity held in a fixed position.  ROM deferred due to known fracture.  Sensation is intact distally in the sural, saphenous, DP, SP, and plantar nerve distribution. 2+ DP pulse.  Toes are WWP.  Active motion intact in  the TA/EHL/GS. LLE: Sensation is intact distally in the sural, saphenous, DP, SP, and plantar nerve distribution. 2+ DP pulse.  Toes are WWP.  Active motion intact in the TA/EHL/GS. Tolerates gentle ROM of the hip.  No pain with axial loading.     Test Results Imaging XR of the Right hip demonstrates a minimally displaced Intertrochanteric femur fracture.  Labs cbc Recent Labs    02/23/21 1240 02/24/21 0505  WBC 7.6 4.0  HGB 13.6 12.4*  HCT 41.2 38.6*  PLT 248 256      Recent Labs    02/23/21 1240 02/24/21 0505  NA 139 138  K 3.5 3.7  CL 105 101  CO2 22 28  GLUCOSE 110* 93  BUN 21* 16  CREATININE 0.75 0.72  CALCIUM 9.0 8.9

## 2021-02-24 NOTE — Transfer of Care (Signed)
Immediate Anesthesia Transfer of Care Note  Patient: Nicholas King  Procedure(s) Performed: INTRAMEDULLARY (IM) NAIL INTERTROCHANTRIC (Right: Hip)  Patient Location: PACU  Anesthesia Type:General  Level of Consciousness: awake and alert   Airway & Oxygen Therapy: Patient Spontanous Breathing and Patient connected to face mask oxygen  Post-op Assessment: Report given to RN and Post -op Vital signs reviewed and stable  Post vital signs: Reviewed and stable  Last Vitals:  Vitals Value Taken Time  BP 130/95 02/24/21 1504  Temp    Pulse 81 02/24/21 1505  Resp 12 02/24/21 1505  SpO2 100 % 02/24/21 1505  Vitals shown include unvalidated device data.  Last Pain:  Vitals:   02/24/21 1220  TempSrc: Oral  PainSc: 8       Patients Stated Pain Goal: 5 (62/94/76 5465)  Complications: No notable events documented.

## 2021-02-24 NOTE — Anesthesia Preprocedure Evaluation (Signed)
Anesthesia Evaluation  Patient identified by MRN, date of birth, ID band Patient awake    Reviewed: Allergy & Precautions, NPO status , Patient's Chart, lab work & pertinent test results  History of Anesthesia Complications (+) POST - OP SPINAL HEADACHE and history of anesthetic complications  Airway Mallampati: I  TM Distance: >3 FB Neck ROM: Full    Dental  (+) Lower Dentures, Upper Dentures   Pulmonary neg pulmonary ROS, Current Smoker and Patient abstained from smoking.,    Pulmonary exam normal        Cardiovascular Exercise Tolerance: Good METS: 3 - Mets negative cardio ROS Normal cardiovascular exam     Neuro/Psych negative neurological ROS     GI/Hepatic GERD  ,(+) Hepatitis -, C  Endo/Other  Hypothyroidism Hx lymphoma  Renal/GU negative Renal ROS     Musculoskeletal negative musculoskeletal ROS (+)   Abdominal Normal abdominal exam  (+)   Peds  Hematology negative hematology ROS (+) anemia ,   Anesthesia Other Findings   Reproductive/Obstetrics                             Anesthesia Physical Anesthesia Plan  ASA: 3  Anesthesia Plan: General   Post-op Pain Management: Dilaudid IV   Induction: Intravenous  PONV Risk Score and Plan: 1 and Dexamethasone, Ondansetron and Midazolam  Airway Management Planned: Oral ETT  Additional Equipment:   Intra-op Plan:   Post-operative Plan: Extubation in OR  Informed Consent: I have reviewed the patients History and Physical, chart, labs and discussed the procedure including the risks, benefits and alternatives for the proposed anesthesia with the patient or authorized representative who has indicated his/her understanding and acceptance.     Dental advisory given  Plan Discussed with:   Anesthesia Plan Comments:         Anesthesia Quick Evaluation

## 2021-02-25 DIAGNOSIS — E559 Vitamin D deficiency, unspecified: Secondary | ICD-10-CM

## 2021-02-25 LAB — CBC
HCT: 37.4 % — ABNORMAL LOW (ref 39.0–52.0)
Hemoglobin: 11.7 g/dL — ABNORMAL LOW (ref 13.0–17.0)
MCH: 27.1 pg (ref 26.0–34.0)
MCHC: 31.3 g/dL (ref 30.0–36.0)
MCV: 86.6 fL (ref 80.0–100.0)
Platelets: 270 10*3/uL (ref 150–400)
RBC: 4.32 MIL/uL (ref 4.22–5.81)
RDW: 13.3 % (ref 11.5–15.5)
WBC: 6.3 10*3/uL (ref 4.0–10.5)
nRBC: 0 % (ref 0.0–0.2)

## 2021-02-25 MED ORDER — VITAMIN D (ERGOCALCIFEROL) 1.25 MG (50000 UNIT) PO CAPS: 50000.0000 [IU] | ORAL_CAPSULE | ORAL | 0 refills | Status: AC

## 2021-02-25 MED ORDER — ASPIRIN 81 MG PO TBEC: 81.0000 mg | DELAYED_RELEASE_TABLET | Freq: Two times a day (BID) | ORAL | 0 refills | Status: DC

## 2021-02-25 MED ORDER — ACETAMINOPHEN 325 MG PO TABS: 650.0000 mg | ORAL_TABLET | Freq: Three times a day (TID) | ORAL | 0 refills | Status: DC

## 2021-02-25 MED ORDER — VITAMIN D (ERGOCALCIFEROL) 1.25 MG (50000 UNIT) PO CAPS: 50000.0000 [IU] | ORAL_CAPSULE | ORAL | 0 refills | Status: DC

## 2021-02-25 MED ORDER — OXYCODONE HCL 5 MG PO TABS: 5.0000 mg | ORAL_TABLET | Freq: Four times a day (QID) | ORAL | 0 refills | Status: DC | PRN

## 2021-02-25 MED ORDER — ASPIRIN 81 MG PO TBEC: 81.0000 mg | DELAYED_RELEASE_TABLET | Freq: Two times a day (BID) | ORAL | 0 refills | Status: AC

## 2021-02-25 MED ORDER — OMEPRAZOLE MAGNESIUM 20 MG PO TBEC
20.0000 mg | DELAYED_RELEASE_TABLET | Freq: Every day | ORAL | 1 refills | Status: AC
Start: 2021-02-25 — End: ?

## 2021-02-25 MED ORDER — ACETAMINOPHEN 325 MG PO TABS: 650.0000 mg | ORAL_TABLET | Freq: Three times a day (TID) | ORAL | 0 refills | Status: AC

## 2021-02-25 MED ORDER — OMEPRAZOLE MAGNESIUM 20 MG PO TBEC: 20.0000 mg | DELAYED_RELEASE_TABLET | Freq: Every day | ORAL | 1 refills | Status: DC

## 2021-02-25 MED ORDER — ASPIRIN EC 81 MG PO TBEC
81.0000 mg | DELAYED_RELEASE_TABLET | Freq: Two times a day (BID) | ORAL | Status: DC
  Administered 2021-02-25: 81 mg via ORAL
  Filled 2021-02-25: qty 1

## 2021-02-25 NOTE — Progress Notes (Signed)
° °  ORTHOPAEDIC PROGRESS NOTE  s/p Procedure(s):  Cephalomedullary nail for right intertrochanteric femur fracture   DOS: 02/24/21  SUBJECTIVE: Pain is controlled.  No issues over night.  Working with PT this morning.  Required zero assistance and was walking in the hall with minimal assist.   OBJECTIVE: PE:  Alert and oriented.  No acute distress Walking with the assistance of a walker, very little difficulty.  Actively moving right hip Sensation to right foot intact Active motion to right foot intact Dressing to right hip are clean, dry and intact  Vitals:   02/24/21 2214 02/25/21 0449  BP: 111/90 (!) 133/93  Pulse: (!) 107 89  Resp:    Temp: 98.2 F (36.8 C) 98.2 F (36.8 C)  SpO2: 97% 99%    CBC Latest Ref Rng & Units 02/25/2021 02/24/2021 02/23/2021  WBC 4.0 - 10.5 K/uL 6.3 4.0 7.6  Hemoglobin 13.0 - 17.0 g/dL 11.7(L) 12.4(L) 13.6  Hematocrit 39.0 - 52.0 % 37.4(L) 38.6(L) 41.2  Platelets 150 - 400 K/uL 270 256 248   XR of the pelvis and right hip demonstrates stable alignment of the intertrochanteric femur fracture.  Hardware in good position.  No acute injuries.    ASSESSMENT: Nicholas King is a 51 y.o. male doing well postoperatively.  Walked over 100 feet with PT  PLAN: Weightbearing: WBAT RLE Insicional and dressing care: Reinforce dressings as needed Orthopedic device(s): None VTE prophylaxis:  At discretion of primary team; recommend 81 mg aspirin BID x 6 weeks   Pain control: PRN pain medications, PO.  Wean off narcotics as soon as possible.  Follow - up plan: 2 weeks for staple removal and XR   Contact information:     Zavon Hyson A. Amedeo Kinsman, MD [facility] [address] [place],  [place]  [address] Phone: [phone] Fax: [phone]

## 2021-02-25 NOTE — Discharge Instructions (Signed)
Please take aspirin 81 mg twice daily for 6 weeks to prevent blood clots Please follow up with orthopedics in 2 weeks for staple removal and xray    IMPORTANT INFORMATION: PAY CLOSE ATTENTION   PHYSICIAN DISCHARGE INSTRUCTIONS  Follow with Primary care provider  Pcp, No  and other consultants as instructed by your Hospitalist Physician  [facility] IF SYMPTOMS COME BACK, WORSEN OR NEW PROBLEM DEVELOPS   Please note: You were cared for by a hospitalist during your hospital stay. Every effort will be made to forward records to your primary care provider.  You can request that your primary care provider send for your hospital records if they have not received them.  Once you are discharged, your primary care physician will handle any further medical issues. Please note that NO REFILLS for any discharge medications will be authorized once you are discharged, as it is imperative that you return to your primary care physician (or establish a relationship with a primary care physician if you do not have one) for your post hospital discharge needs so that they can reassess your need for medications and monitor your lab values.  Please get a complete blood count and chemistry panel checked by your Primary MD at your next visit, and again as instructed by your Primary MD.  Get Medicines reviewed and adjusted: Please take all your medications with you for your next visit with your Primary MD  Laboratory/radiological data: Please request your Primary MD to go over all hospital tests and procedure/radiological results at the follow up, please ask your primary care provider to get all Hospital records sent to his/her office.  In some cases, they will be blood work, cultures and biopsy results pending at the time of your discharge. Please request that your primary care provider follow up on these results.  If you are diabetic, please bring your blood sugar readings with  you to your follow up appointment with primary care.    Please call and make your follow up appointments as soon as possible.    Also Note the following: If you experience worsening of your admission symptoms, develop shortness of breath, life threatening emergency, suicidal or homicidal thoughts you must seek medical attention immediately by calling 911 or calling your MD immediately  if symptoms less severe.  You must read complete instructions/literature along with all the possible adverse reactions/side effects for all the Medicines you take and that have been prescribed to you. Take any new Medicines after you have completely understood and accpet all the possible adverse reactions/side effects.   Do not drive when taking Pain medications or sleeping medications (Benzodiazepines)  Do not take more than prescribed Pain, Sleep and Anxiety Medications. It is not advisable to combine anxiety,sleep and pain medications without talking with your primary care practitioner  Special Instructions: If you have smoked or chewed Tobacco  in the last 2 yrs please stop smoking, stop any regular Alcohol  and or any Recreational drug use.  Wear Seat belts while driving.  Do not drive if taking any narcotic, mind altering or controlled substances or recreational drugs or alcohol.

## 2021-02-25 NOTE — Evaluation (Signed)
Physical Therapy Evaluation Patient Details Name: Nicholas King MRN: [id number] DOB: 1970-07-14 Today's Date: 02/25/2021  History of Present Illness  Nicholas King is a 51 y.o. male with medical history significant for B-cell lymphoma who was recently incarcerated (just released today) unfortunately fell out of a bunk bed while incarcerated 1 week ago (02/16/21).  He reportedly did hit his head and fell on right hip but did not lose consciousness.  He lay in bed unable to bear weight for several days in severe pain.  He was finally sent for xrays but reportedly never was informed of the results.  He was told by prison staff that they were taking him to see an orthopedist today.  He saw Dr. Luna Glasgow who got xrays that revealed intertrochanteric right hip fracture nondisplaced.  He reports that he was told his prison sentence was adjusted and he was released from custody today.  He served about 133 days out of a 200 day sentence for a larceny misdemeanor.  Dr. Luna Glasgow made arrangements for him to come to the ED for treatment.  Dr. Amedeo Kinsman orthopedist on call was consulted and agreed to see patient with plans for operative management in the next 24 hours.   Clinical Impression  Patient does not require assist with bed mobility or transfer to standing with RW. Intermittent cueing required for proper RW use and sequencing. Patient ambulates without loss of balance with RW. Patient will require RW for ambulation to discharge home. Patient discharged to care of nursing for ambulation daily as tolerated for length of stay.      Recommendations for follow up therapy are one component of a multi-disciplinary discharge planning process, led by the attending physician.  Recommendations may be updated based on patient status, additional functional criteria and insurance authorization.  Follow Up Recommendations Home health PT    Assistance Recommended at Discharge Intermittent Supervision/Assistance  Patient  can return home with the following  Assistance with cooking/housework;Assist for transportation;A little help with bathing/dressing/bathroom    Equipment Recommendations Rolling walker (2 wheels)  Recommendations for Other Services       Functional Status Assessment Patient has had a recent decline in their functional status and demonstrates the ability to make significant improvements in function in a reasonable and predictable amount of time.     Precautions / Restrictions Precautions Precautions: Fall Restrictions Weight Bearing Restrictions: Yes RLE Weight Bearing: Weight bearing as tolerated      Mobility  Bed Mobility Overal bed mobility: Independent                  Transfers Overall transfer level: Independent Equipment used: Rolling walker (2 wheels)                    Ambulation/Gait Ambulation/Gait assistance: Modified independent (Device/Increase time), Min guard Gait Distance (Feet): 150 Feet Assistive device: Rolling walker (2 wheels) Gait Pattern/deviations: Step-through pattern Gait velocity: decreased     General Gait Details: cueing for proper RW used  Stairs            Wheelchair Mobility    Modified Rankin (Stroke Patients Only)       Balance Overall balance assessment: Modified Independent                                           Pertinent Vitals/Pain Pain Assessment Pain Assessment: 0-10 Pain Score:  8  Pain Location: R hip Pain Descriptors / Indicators: Sore Pain Intervention(s): Limited activity within patient's tolerance, Monitored during session, Repositioned    Home Living Family/patient expects to be discharged to:: Private residence Living Arrangements: Other relatives Available Help at Discharge: Family;Available 24 hours/day Type of Home: House Home Access: Level entry       Home Layout: One level Home Equipment: None      Prior Function Prior Level of Function :  Independent/Modified Independent             Mobility Comments: community ambulator without AD       Hand Dominance        Extremity/Trunk Assessment   Upper Extremity Assessment Upper Extremity Assessment: Overall WFL for tasks assessed    Lower Extremity Assessment Lower Extremity Assessment: Overall WFL for tasks assessed;RLE deficits/detail RLE Deficits / Details: decreased mobility due to pain s/p hip surgery       Communication   Communication: No difficulties  Cognition Arousal/Alertness: Awake/alert Behavior During Therapy: WFL for tasks assessed/performed Overall Cognitive Status: Within Functional Limits for tasks assessed                                          General Comments      Exercises     Assessment/Plan    PT Assessment All further PT needs can be met in the next venue of care  PT Problem List Decreased strength;Decreased mobility;Decreased activity tolerance;Decreased balance;Decreased knowledge of use of DME;Decreased range of motion       PT Treatment Interventions      PT Goals (Current goals can be found in the Care Plan section)  Acute Rehab PT Goals Patient Stated Goal: Return home PT Goal Formulation: With patient Time For Goal Achievement: 02/25/21 Potential to Achieve Goals: Good    Frequency       Co-evaluation               AM-PAC PT "6 Clicks" Mobility  Outcome Measure Help needed turning from your back to your side while in a flat bed without using bedrails?: None Help needed moving from lying on your back to sitting on the side of a flat bed without using bedrails?: None Help needed moving to and from a bed to a chair (including a wheelchair)?: None Help needed standing up from a chair using your arms (e.g., wheelchair or bedside chair)?: None Help needed to walk in hospital room?: None Help needed climbing 3-5 steps with a railing? : A Little 6 Click Score: 23    End of Session  Equipment Utilized During Treatment: Gait belt Activity Tolerance: Patient tolerated treatment well Patient left: in chair;with call bell/phone within reach Nurse Communication: Mobility status PT Visit Diagnosis: Unsteadiness on feet (R26.81);Other abnormalities of gait and mobility (R26.89);Muscle weakness (generalized) (M62.81)    Time: 2878-6767 PT Time Calculation (min) (ACUTE ONLY): 17 min   Charges:   PT Evaluation $PT Eval Low Complexity: 1 Low PT Treatments $Therapeutic Activity: 8-22 mins        10:26 AM, 02/25/21 Mearl Latin PT, DPT Physical Therapist at [facility]

## 2021-02-25 NOTE — Progress Notes (Signed)
Patient had had uneventful shift.  Patient has received pain medications per MD order.  Patient requested foley be taken out due to discomfort.  Vitals have been stable and patient able to tolerated food and drink with no issues.

## 2021-02-25 NOTE — TOC Initial Note (Addendum)
Transition of Care [facility]) - Initial/Assessment Note    Patient Details  Name: Nicholas King MRN: [id number] Date of Birth: 1970-09-19  Transition of Care [facility]) CM/SW Contact:    Elliot Gurney [place], [place] Phone Number:[phone] 02/25/2021, 11:40 AM  Clinical Narrative:                 Patient admitted with right hip fracture following fall from bunk bed while incarcerated. Patient discharging home today. Patient currently resides with his brother who will be transporting patient home today. Per patient, his brother is available to assist with his care once discharged. Discharge recommendations discussed, patient has no preferences regarding [place] agency utilized. Corene Cornea from [facility] accepts [facility] referral for PT. [employer] contacted and  will deliver walker to patient's room before discharge. Patient will follow up with  Dr. Amedeo Kinsman in 2 weeks for staple removal and xray.  Expected Discharge Plan: [place] Barriers to Discharge: No Barriers Identified   Patient Goals and CMS Choice Patient states their goals for this hospitalization and ongoing recovery are:: "To get better"   Choice offered to / list presented to : Patient  Expected Discharge Plan and Services Expected Discharge Plan: [place] In-house Referral: Clinical Social Work   Post Acute Care Choice: Durable Medical Equipment Living arrangements for the past 2 months: Single Family Home Expected Discharge Date: 02/25/21               DME Arranged: Gilford Rile rolling DME Agency: [employer] Date DME Agency Contacted: 02/24/21 Time DME Agency Contacted: 29 Representative spoke with at DME Agency: [place]: PT [place] Agency: Other - See comment Date [place]: 02/25/21 Time [place]: 41 Representative spoke with at [place]: [facility]  Prior Living Arrangements/Services Living arrangements for the past 2 months: [place] with::  Siblings Patient language and need for interpreter reviewed:: No Do you feel safe going back to the place where you live?: Yes      Need for Family Participation in Patient Care: No (Comment) Care giver support system in place?: Yes (comment)   Criminal Activity/Legal Involvement Pertinent to Current Situation/Hospitalization: Yes - Comment as needed (per patient, fell out of a bunk bed while incarcerated resulting in right hip fracture)  Activities of Daily Living Home Assistive Devices/Equipment: None ADL Screening (condition at time of admission) Patient's cognitive ability adequate to safely complete daily activities?: Yes Is the patient deaf or have difficulty hearing?: No Does the patient have difficulty seeing, even when wearing glasses/contacts?: No Does the patient have difficulty concentrating, remembering, or making decisions?: No Patient able to express need for assistance with ADLs?: Yes Does the patient have difficulty dressing or bathing?: No Independently performs ADLs?: Yes (appropriate for developmental age) Does the patient have difficulty walking or climbing stairs?: No Weakness of Legs: Right Weakness of Arms/Hands: None  Permission Sought/Granted                  Emotional Assessment Appearance:: Appears older than stated age Attitude/Demeanor/Rapport: Engaged Affect (typically observed): Accepting Orientation: : Oriented to Place, Oriented to  Time, Oriented to Situation, Oriented to Self Alcohol / Substance Use: Not Applicable Psych Involvement: No (comment)  Admission diagnosis:  Hip fracture, right ([place]) [S72.001A] Pre-op exam [Z01.818] Fall, initial encounter [W19.XXXA] Closed fracture of right hip, initial encounter ([place]) [S72.001A] Closed fracture of hip, unspecified laterality, initial encounter [facility]) [S72.009A] Patient Active Problem List   Diagnosis Date Noted  Vitamin D deficiency 02/24/2021   Hip fracture, right ([place]) 02/23/2021    Hepatitis C antibody positive in blood 11/27/2017   Need for hepatitis C screening test 11/26/2017   Cellulitis 10/14/2016   Hyponatremia 10/14/2016   Skin ulcers of both feet ([place])    Acquired hypothyroidism 10/12/2016   Iron deficiency anemia 10/12/2016   Bug bites 10/12/2016   Chronic midline back pain 10/13/2015   Anemia in chronic illness 01/20/2015   Underweight 12/30/2014   CAP (community acquired pneumonia) 12/28/2014   Encounter for central line placement    Malnutrition of moderate degree ([place]) 08/03/2014   Fever chills 07/31/2014   Sepsis (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 [place] [address] Phone: [phone] Fax: [phone]  [place] [address] Phone: [phone] Fax: [phone]  [place], [place] [address] [place] [address] Phone: [phone] Fax: [phone]     Social Determinants of Health (SDOH) Interventions    Readmission Risk Interventions No flowsheet data found.

## 2021-02-25 NOTE — Discharge Summary (Signed)
Physician Discharge Summary  Nicholas King MBW:[id number] DOB: Jul 04, 1970 DOA: 02/23/2021  Orthopedics: Dr. Amedeo Kinsman  Admit date: 02/23/2021 Discharge date: 02/25/2021  Disposition: Home with home health  Recommendations for Outpatient Follow-up:  Follow up with Dr. Amedeo Kinsman in 2 weeks for staple removal and xray Please take aspirin 81 mg twice daily for 6 weeks for DVT prophylaxis  Home Health:  PT, rolling walker   Discharge Condition: STABLE   CODE STATUS: FULL  DIET: regular    Brief Hospitalization Summary: Please see all hospital notes, images, labs for full details of the hospitalization. ADMISSION HPI: Nicholas King is a 51 y.o. male with medical history significant for B-cell lymphoma who was recently incarcerated (just released today) unfortunately fell out of a bunk bed while incarcerated 1 week ago (02/16/21).  He reportedly did hit his head and fell on right hip but did not lose consciousness.  He lay in bed unable to bear weight for several days in severe pain.  He was finally sent for xrays but reportedly never was informed of the results.  He was told by prison staff that they were taking him to see an orthopedist today.  He saw Dr. Luna Glasgow who got xrays that revealed intertrochanteric right hip fracture nondisplaced.  He reports that he was told his prison sentence was adjusted and he was released from custody today.  He served about 133 days out of a 200 day sentence for a larceny misdemeanor.  Dr. Luna Glasgow made arrangements for him to come to the ED for treatment.  Dr. Amedeo Kinsman orthopedist on call was consulted and agreed to see patient with plans for operative management in the next 24 hours.    Hospital Course  Pt was admitted with right hip intertrochanteric fracture and treated by Dr. Amedeo Kinsman with ORIF on 02/24/2021.  Patient noted to have vitamin D deficiency and started on high-dose vitamin D 50,000 IU capsule once weekly.  Patient tolerated procedure well.  He has ambulated  with physical therapy and they are recommending home health PT with rolling walker which has been ordered.  The patient has been started on aspirin 81 mg twice daily to continue for 6 weeks for DVT prophylaxis as recommended by orthopedics.  Patient stable to discharge home.  Patient is to follow-up with Dr. Amedeo Kinsman in 2 weeks for staple removal and x-ray.   Discharge Diagnoses:  Principal Problem:   Hip fracture, right (HCC) Active Problems:   History of B-cell lymphoma   GERD (gastroesophageal reflux disease)   Cigarette smoker   Neuropathic pain   Neuropathy   Poor dentition   Anemia in chronic illness   Acquired hypothyroidism   Hepatitis C antibody positive in blood   Vitamin D deficiency   Discharge Instructions:  Allergies as of 02/25/2021       Reactions   Vancomycin Itching, Rash   Extreme Itching, red skin from head to torso.     Cymbalta [duloxetine Hcl] Other (See Comments)   Suicidal and "crazy"   Penicillins Hives   Has patient had a PCN reaction causing immediate rash, facial/tongue/throat swelling, SOB or lightheadedness with hypotension: no Has patient had a PCN reaction causing severe rash involving mucus membranes or skin necrosis: no Has patient had a PCN reaction that required hospitalization no Has patient had a PCN reaction occurring within the last 10 years: no If all of the above answers are "NO", then may proceed with Cephalosporin use.   Amoxicillin Rash   Hydrocodone Rash  Medication List     TAKE these medications    acetaminophen 325 MG tablet Commonly known as: TYLENOL Take 2 tablets (650 mg total) by mouth in the morning, at noon, and at bedtime.   aspirin 81 MG EC tablet Take 1 tablet (81 mg total) by mouth 2 (two) times daily. Swallow whole.   omeprazole 20 MG tablet Commonly known as: PriLOSEC OTC Take 1 tablet (20 mg total) by mouth daily.   oxyCODONE 5 MG immediate release tablet Commonly known as: Oxy  IR/ROXICODONE Take 1 tablet (5 mg total) by mouth every 6 (six) hours as needed for up to 5 days for severe pain.   Vitamin D (Ergocalciferol) 1.25 MG (50000 UNIT) Caps capsule Commonly known as: DRISDOL Take 1 capsule (50,000 Units total) by mouth every 7 (seven) days.               Durable Medical Equipment  (From admission, onward)           Start     Ordered   02/25/21 1030  For home use only DME Walker rolling  Once       Question Answer Comment  Walker: With 5 Inch Wheels   Patient needs a walker to treat with the following condition Hip fracture, right ([place])      02/25/21 1030            Follow-up Information     Mordecai Rasmussen, MD. Schedule an appointment as soon as possible for a visit in 2 week(s).   Specialties: Orthopedic Surgery, Sports Medicine Why: For staple removal and xray Contact information: [address] [place] [address] [phone]                Allergies  Allergen Reactions   Vancomycin Itching and Rash    Extreme Itching, red skin from head to torso.     Cymbalta [Duloxetine Hcl] Other (See Comments)    Suicidal and "crazy"   Penicillins Hives    Has patient had a PCN reaction causing immediate rash, facial/tongue/throat swelling, SOB or lightheadedness with hypotension: no Has patient had a PCN reaction causing severe rash involving mucus membranes or skin necrosis: no Has patient had a PCN reaction that required hospitalization no Has patient had a PCN reaction occurring within the last 10 years: no If all of the above answers are "NO", then may proceed with Cephalosporin use.    Amoxicillin Rash   Hydrocodone Rash   Allergies as of 02/25/2021       Reactions   Vancomycin Itching, Rash   Extreme Itching, red skin from head to torso.     Cymbalta [duloxetine Hcl] Other (See Comments)   Suicidal and "crazy"   Penicillins Hives   Has patient had a PCN reaction causing immediate rash, facial/tongue/throat  swelling, SOB or lightheadedness with hypotension: no Has patient had a PCN reaction causing severe rash involving mucus membranes or skin necrosis: no Has patient had a PCN reaction that required hospitalization no Has patient had a PCN reaction occurring within the last 10 years: no If all of the above answers are "NO", then may proceed with Cephalosporin use.   Amoxicillin Rash   Hydrocodone Rash        Medication List     TAKE these medications    acetaminophen 325 MG tablet Commonly known as: TYLENOL Take 2 tablets (650 mg total) by mouth in the morning, at noon, and at bedtime.   aspirin 81 MG  EC tablet Take 1 tablet (81 mg total) by mouth 2 (two) times daily. Swallow whole.   omeprazole 20 MG tablet Commonly known as: PriLOSEC OTC Take 1 tablet (20 mg total) by mouth daily.   oxyCODONE 5 MG immediate release tablet Commonly known as: Oxy IR/ROXICODONE Take 1 tablet (5 mg total) by mouth every 6 (six) hours as needed for up to 5 days for severe pain.   Vitamin D (Ergocalciferol) 1.25 MG (50000 UNIT) Caps capsule Commonly known as: DRISDOL Take 1 capsule (50,000 Units total) by mouth every 7 (seven) days.               Durable Medical Equipment  (From admission, onward)           Start     Ordered   02/25/21 1030  For home use only DME Walker rolling  Once       Question Answer Comment  Walker: With 5 Inch Wheels   Patient needs a walker to treat with the following condition Hip fracture, right ([place])      02/25/21 1030            Procedures/Studies: DG Chest 1 View  Result Date: 02/23/2021 CLINICAL DATA:  Preoperative for hip fracture EXAM: CHEST  1 VIEW COMPARISON:  07/28/2016 chest radiograph. FINDINGS: Stable cardiomediastinal silhouette with normal heart size. No pneumothorax. No pleural effusion. Lungs appear clear, with no acute consolidative airspace disease and no pulmonary edema. IMPRESSION: No active disease. Electronically Signed    By: Ilona Sorrel M.D.   On: 02/23/2021 12:43   DG Pelvis Portable  Result Date: 02/24/2021 CLINICAL DATA:  Postop hip fracture EXAM: PORTABLE PELVIS 1-2 VIEWS COMPARISON:  01/10/2014, 02/23/2021 FINDINGS: Interval intramedullary rodding of the right femur for intertrochanteric fracture. Gas in the soft tissues consistent with recent surgery IMPRESSION: Interval surgical fixation of right intertrochanteric fracture with expected postsurgical changes Electronically Signed   By: Donavan Foil M.D.   On: 02/24/2021 15:50   DG C-Arm 1-60 Min-No Report  Result Date: 02/24/2021 Fluoroscopy was utilized by the requesting physician.  No radiographic interpretation.   DG C-Arm 1-60 Min-No Report  Result Date: 02/24/2021 Fluoroscopy was utilized by the requesting physician.  No radiographic interpretation.   DG HIP UNILAT WITH PELVIS 2-3 VIEWS RIGHT  Result Date: 02/24/2021 CLINICAL DATA:  Right hip fracture.  ORIF. EXAM: DG HIP (WITH OR WITHOUT PELVIS) 2-3V RIGHT; DG C-ARM 1-60 MIN-NO REPORT COMPARISON:  02/23/2021 FINDINGS: Eight C-arm images obtained. Right intertrochanteric fracture in satisfactory alignment post ORIF. Compression fracture and intramedullary rod in the proximal right femur. IMPRESSION: ORIF right intertrochanteric fracture.  Fracture in good alignment. Electronically Signed   By: Franchot Gallo M.D.   On: 02/24/2021 16:03   DG HIP UNILAT WITH PELVIS 2-3 VIEWS RIGHT  Result Date: 02/23/2021 Clinical:  [place] out of top bunk, hip pain right X-rays were done of the right hip, three views. There is an incomplete intertrochanteric fracture of the right hip.  Fracture line just goes to lesser trochanter but not through the cortex there.  Hip is located.  Bone quality is good. Impression:  Incomplete intertrochanteric fracture of the right hip nondisplaced. Electronically Signed Sanjuana Kava, MD 1/19/20238:35 AM   DG FEMUR PORT, MIN 2 VIEWS RIGHT  Result Date: 02/24/2021 CLINICAL DATA:   Postop ORIF for hip fracture EXAM: RIGHT FEMUR PORTABLE 2 VIEW COMPARISON:  02/23/2021 FINDINGS: Compression screw and locking intramedullary rod in the right femur. Fracture of the intertrochanteric right fracture  femur in good alignment. Hardware in good position. No new fracture or complication. IMPRESSION: ORIF right intertrochanteric fracture Electronically Signed   By: Franchot Gallo M.D.   On: 02/24/2021 16:01     Subjective: Pt has been feeling well and motivated to go home. Ambulated with PT.  Agreeable to home health.    Discharge Exam: Vitals:   02/24/21 2214 02/25/21 0449  BP: 111/90 (!) 133/93  Pulse: (!) 107 89  Resp:    Temp: 98.2 F (36.8 C) 98.2 F (36.8 C)  SpO2: 97% 99%   Vitals:   02/24/21 1530 02/24/21 1554 02/24/21 2214 02/25/21 0449  BP: 95/71 106/69 111/90 (!) 133/93  Pulse: 81 78 (!) 107 89  Resp: (!) 9 18    Temp:  (!) 97.5 F (36.4 C) 98.2 F (36.8 C) 98.2 F (36.8 C)  TempSrc:  Oral Oral Oral  SpO2: 97% 100% 97% 99%  Weight:      Height:       General: Pt is alert, awake, not in acute distress Cardiovascular: normal S1/S2 +, no rubs, no gallops Respiratory: CTA bilaterally, no wheezing, no rhonchi Abdominal: Soft, NT, ND, bowel sounds + Extremities: wound clean and dry and intact with good distal pulses    The results of significant diagnostics from this hospitalization (including imaging, microbiology, ancillary and laboratory) are listed below for reference.     Microbiology: Recent Results (from the past 240 hour(s))  Resp Panel by RT-PCR (Flu A&B, Covid) Nasopharyngeal Swab     Status: None   Collection Time: 02/23/21  2:12 PM   Specimen: Nasopharyngeal Swab; Nasopharyngeal(NP) swabs in vial transport medium  Result Value Ref Range Status   SARS Coronavirus 2 by RT PCR NEGATIVE NEGATIVE Final    Comment: (NOTE) SARS-CoV-2 target nucleic acids are NOT DETECTED.  The SARS-CoV-2 RNA is generally detectable in upper respiratory specimens  during the acute phase of infection. The lowest concentration of SARS-CoV-2 viral copies this assay can detect is 138 copies/mL. A negative result does not preclude SARS-Cov-2 infection and should not be used as the sole basis for treatment or other patient management decisions. A negative result may occur with  improper specimen collection/handling, submission of specimen other than nasopharyngeal swab, presence of viral mutation(s) within the areas targeted by this assay, and inadequate number of viral copies(<138 copies/mL). A negative result must be combined with clinical observations, patient history, and epidemiological information. The expected result is Negative.  Fact Sheet for Patients:  EntrepreneurPulse.com.au  Fact Sheet for Healthcare Providers:  IncredibleEmployment.be  This test is no t yet approved or cleared by the [place] FDA and  has been authorized for detection and/or diagnosis of SARS-CoV-2 by FDA under an Emergency Use Authorization (EUA). This EUA will remain  in effect (meaning this test can be used) for the duration of the COVID-19 declaration under Section 564(b)(1) of the Act, 21 U.S.C.section 360bbb-3(b)(1), unless the authorization is terminated  or revoked sooner.       Influenza A by PCR NEGATIVE NEGATIVE Final   Influenza B by PCR NEGATIVE NEGATIVE Final    Comment: (NOTE) The Xpert Xpress SARS-CoV-2/FLU/RSV plus assay is intended as an aid in the diagnosis of influenza from Nasopharyngeal swab specimens and should not be used as a sole basis for treatment. Nasal washings and aspirates are unacceptable for Xpert Xpress SARS-CoV-2/FLU/RSV testing.  Fact Sheet for Patients: EntrepreneurPulse.com.au  Fact Sheet for Healthcare Providers: IncredibleEmployment.be  This test is not yet approved or cleared by  the [employer] and has been authorized for detection  and/or diagnosis of SARS-CoV-2 by FDA under an Emergency Use Authorization (EUA). This EUA will remain in effect (meaning this test can be used) for the duration of the COVID-19 declaration under Section 564(b)(1) of the Act, 21 U.S.C. section 360bbb-3(b)(1), unless the authorization is terminated or revoked.  Performed at [facility], [address], [place], [place] [address]   Surgical PCR screen     Status: None   Collection Time: 02/24/21  1:08 AM   Specimen: Urine, Clean Catch; Nasal Swab  Result Value Ref Range Status   MRSA, PCR NEGATIVE NEGATIVE Final   Staphylococcus aureus NEGATIVE NEGATIVE Final    Comment: (NOTE) The Xpert SA Assay (FDA approved for NASAL specimens in patients 9 years of age and older), is one component of a comprehensive surveillance program. It is not intended to diagnose infection nor to guide or monitor treatment. Performed at [facility], [address] [place], [place] [address]      Labs: BNP (last 3 results) No results for input(s): BNP in the last 8760 hours. Basic Metabolic Panel: Recent Labs  Lab 02/23/21 1240 02/24/21 0505  NA 139 138  K 3.5 3.7  CL 105 101  CO2 22 28  GLUCOSE 110* 93  BUN 21* 16  CREATININE 0.75 0.72  CALCIUM 9.0 8.9  MG  --  2.0   Liver Function Tests: No results for input(s): AST, ALT, ALKPHOS, BILITOT, PROT, ALBUMIN in the last 168 hours. No results for input(s): LIPASE, AMYLASE in the last 168 hours. No results for input(s): AMMONIA in the last 168 hours. CBC: Recent Labs  Lab 02/23/21 1240 02/24/21 0505 02/25/21 0409  WBC 7.6 4.0 6.3  NEUTROABS 6.6  --   --   HGB 13.6 12.4* 11.7*  HCT 41.2 38.6* 37.4*  MCV 86.6 87.5 86.6  PLT 248 256 270   Cardiac Enzymes: No results for input(s): CKTOTAL, CKMB, CKMBINDEX, TROPONINI in the last 168 hours. BNP: Invalid input(s): POCBNP CBG: No results for input(s): GLUCAP in the last 168 hours. D-Dimer No results for input(s): DDIMER in the last 72  hours. Hgb A1c No results for input(s): HGBA1C in the last 72 hours. Lipid Profile No results for input(s): CHOL, HDL, LDLCALC, TRIG, CHOLHDL, LDLDIRECT in the last 72 hours. Thyroid function studies No results for input(s): TSH, T4TOTAL, T3FREE, THYROIDAB in the last 72 hours.  Invalid input(s): FREET3 Anemia work up No results for input(s): VITAMINB12, FOLATE, FERRITIN, TIBC, IRON, RETICCTPCT in the last 72 hours. Urinalysis    Component Value Date/Time   COLORURINE COLORLESS (A) 11/28/2019 0327   APPEARANCEUR CLEAR 11/28/2019 0327   LABSPEC 1.008 11/28/2019 0327   PHURINE 9.0 (H) 11/28/2019 0327   GLUCOSEU NEGATIVE 11/28/2019 0327   HGBUR LARGE (A) 11/28/2019 0327   BILIRUBINUR NEGATIVE 11/28/2019 0327   KETONESUR 20 (A) 11/28/2019 0327   PROTEINUR NEGATIVE 11/28/2019 0327   UROBILINOGEN 0.2 07/31/2014 1612   NITRITE NEGATIVE 11/28/2019 0327   LEUKOCYTESUR NEGATIVE 11/28/2019 0327   Sepsis Labs Invalid input(s): PROCALCITONIN,  WBC,  LACTICIDVEN Microbiology Recent Results (from the past 240 hour(s))  Resp Panel by RT-PCR (Flu A&B, Covid) Nasopharyngeal Swab     Status: None   Collection Time: 02/23/21  2:12 PM   Specimen: Nasopharyngeal Swab; Nasopharyngeal(NP) swabs in vial transport medium  Result Value Ref Range Status   SARS Coronavirus 2 by RT PCR NEGATIVE NEGATIVE Final    Comment: (NOTE) SARS-CoV-2 target nucleic  acids are NOT DETECTED.  The SARS-CoV-2 RNA is generally detectable in upper respiratory specimens during the acute phase of infection. The lowest concentration of SARS-CoV-2 viral copies this assay can detect is 138 copies/mL. A negative result does not preclude SARS-Cov-2 infection and should not be used as the sole basis for treatment or other patient management decisions. A negative result may occur with  improper specimen collection/handling, submission of specimen other than nasopharyngeal swab, presence of viral mutation(s) within the areas  targeted by this assay, and inadequate number of viral copies(<138 copies/mL). A negative result must be combined with clinical observations, patient history, and epidemiological information. The expected result is Negative.  Fact Sheet for Patients:  EntrepreneurPulse.com.au  Fact Sheet for Healthcare Providers:  IncredibleEmployment.be  This test is no t yet approved or cleared by the [place] FDA and  has been authorized for detection and/or diagnosis of SARS-CoV-2 by FDA under an Emergency Use Authorization (EUA). This EUA will remain  in effect (meaning this test can be used) for the duration of the COVID-19 declaration under Section 564(b)(1) of the Act, 21 U.S.C.section 360bbb-3(b)(1), unless the authorization is terminated  or revoked sooner.       Influenza A by PCR NEGATIVE NEGATIVE Final   Influenza B by PCR NEGATIVE NEGATIVE Final    Comment: (NOTE) The Xpert Xpress SARS-CoV-2/FLU/RSV plus assay is intended as an aid in the diagnosis of influenza from Nasopharyngeal swab specimens and should not be used as a sole basis for treatment. Nasal washings and aspirates are unacceptable for Xpert Xpress SARS-CoV-2/FLU/RSV testing.  Fact Sheet for Patients: EntrepreneurPulse.com.au  Fact Sheet for Healthcare Providers: IncredibleEmployment.be  This test is not yet approved or cleared by the [place] FDA and has been authorized for detection and/or diagnosis of SARS-CoV-2 by FDA under an Emergency Use Authorization (EUA). This EUA will remain in effect (meaning this test can be used) for the duration of the COVID-19 declaration under Section 564(b)(1) of the Act, 21 U.S.C. section 360bbb-3(b)(1), unless the authorization is terminated or revoked.  Performed at [facility], [address], [place], [place] [address]   Surgical PCR screen     Status: None   Collection Time: 02/24/21  1:08 AM    Specimen: Urine, Clean Catch; Nasal Swab  Result Value Ref Range Status   MRSA, PCR NEGATIVE NEGATIVE Final   Staphylococcus aureus NEGATIVE NEGATIVE Final    Comment: (NOTE) The Xpert SA Assay (FDA approved for NASAL specimens in patients 21 years of age and older), is one component of a comprehensive surveillance program. It is not intended to diagnose infection nor to guide or monitor treatment. Performed at [facility], [address], [place], [place] [address]     Time coordinating discharge: 35 mins  SIGNED:  Irwin Brakeman, MD  [facility] 02/25/2021, 10:53 AM How to contact the [facility] Attending or Consulting provider [place] or covering provider during after hours [place], for this patient?  Check the care team in [facility] and look for a) attending/consulting [facility] provider listed and b) the [facility] team listed Log into [email or web address] and use [place]'s universal password to access. If you do not have the password, please contact the hospital operator. Locate the [facility] provider you are looking for under [facility] and page to a number that you can be directly reached. If you still have difficulty reaching the provider, please page the [facility] (Director on Call) for the Hospitalists listed on amion for assistance.

## 2021-02-27 ENCOUNTER — Encounter (HOSPITAL_COMMUNITY): Payer: Self-pay | Admitting: Orthopedic Surgery

## 2021-03-01 ENCOUNTER — Telehealth: Payer: Self-pay | Admitting: Orthopedic Surgery

## 2021-03-01 MED ORDER — OXYCODONE HCL 5 MG PO TABS: 5.0000 mg | ORAL_TABLET | Freq: Four times a day (QID) | ORAL | 0 refills | Status: AC | PRN

## 2021-03-01 NOTE — Telephone Encounter (Signed)
Patient called to request refill; states he is running out of pain medication following his surgery 02/24/21 on right hip;  last was given  oxyCODONE (OXY IR/ROXICODONE)   - [employer], [address], [place]

## 2021-03-08 ENCOUNTER — Encounter: Payer: Self-pay | Admitting: Orthopedic Surgery

## 2021-03-08 ENCOUNTER — Other Ambulatory Visit: Payer: Self-pay

## 2021-03-08 ENCOUNTER — Ambulatory Visit (INDEPENDENT_AMBULATORY_CARE_PROVIDER_SITE_OTHER): Payer: Medicare Other | Admitting: Orthopedic Surgery

## 2021-03-08 ENCOUNTER — Ambulatory Visit: Payer: Medicare Other

## 2021-03-08 VITALS — Ht 67.0 in | Wt 135.0 lb

## 2021-03-08 DIAGNOSIS — S72141A Displaced intertrochanteric fracture of right femur, initial encounter for closed fracture: Secondary | ICD-10-CM

## 2021-03-08 DIAGNOSIS — Z09 Encounter for follow-up examination after completed treatment for conditions other than malignant neoplasm: Secondary | ICD-10-CM

## 2021-03-08 DIAGNOSIS — S72144D Nondisplaced intertrochanteric fracture of right femur, subsequent encounter for closed fracture with routine healing: Secondary | ICD-10-CM | POA: Diagnosis not present

## 2021-03-08 MED ORDER — OXYCODONE HCL 5 MG PO TABS: 5.0000 mg | ORAL_TABLET | ORAL | 0 refills | Status: DC | PRN

## 2021-03-08 NOTE — Patient Instructions (Signed)
Continue to take tylenol and ibuprofen to help with pain control and limit the amount of narcotics required.

## 2021-03-08 NOTE — Progress Notes (Signed)
Orthopaedic Postop Note  Assessment: Nicholas King is a 51 y.o. male s/p cephalomedullary nail for right intertrochanteric femur fracture  DOS: 02/24/2021  Plan: J[place] Cipro removed, Steri-Strips placed. Radiographs are stable.  No interval displacement. Continue to ambulate with the assistance of a walker. Medications as needed.  Provided an updated prescription.  Stressed the importance of weaning off narcotic pain medications. Tylenol and ibuprofen note of narcotics required. Continue home health therapy, including exercises at home. Continue aspirin for DVT prophylaxis. Follow-up in 4 weeks  Meds ordered this encounter  Medications   oxyCODONE (ROXICODONE) 5 MG immediate release tablet    Sig: Take 1 tablet (5 mg total) by mouth every 4 (four) hours as needed for up to 7 days.    Dispense:  30 tablet    Refill:  0     Follow-up: Return in about 4 weeks (around 04/05/2021). XR at next visit: AP pelvis and right femur  Subjective:  Chief Complaint  Patient presents with   Routine Post Op    Rt hip DOS 02/24/21    History of Present Illness: Nicholas King is a 51 y.o. male who presents following the above stated procedure.  Surgery was 2 weeks ago.  He was discharged home.  He has been getting some home health physical therapy.  He continues to walk with the assistance of a walker.  He continues to take oxycodone for pain control.  No issues with his incisions.  No fevers or chills.  Review of Systems: No fevers or chills No numbness or tingling No Chest Pain No shortness of breath   Objective: Ht 5\' 7"  (1.702 m)    Wt 135 lb (61.2 kg)    BMI 21.14 kg/m   Physical Exam:  Alert and oriented.  No acute distress.  Ambulates with the assistance of a walker.  Lateral base surgical incisions are healing well.  No surrounding erythema or drainage.  He is able to maintain straight leg raise.  No pain with axial loading.  No pain with gentle range of motion of the  right hip.  Active motion is intact in the EHL/TA.  Sensation is intact over the dorsum of his foot.  Toes are warm and well-perfused.  IMAGING: I personally ordered and reviewed the following images:  AP pelvis and right femur x-rays were obtained in clinic today.  The intertrochanteric fracture is visualized, without interval displacement.  Hardware remains in good position.  There has been no evidence of hardware failure or loosening.   Impression: Healing right intertrochanteric femur fracture following operative fixation.  Mordecai Rasmussen, MD 03/08/2021 11:05 PM

## 2021-03-16 ENCOUNTER — Other Ambulatory Visit: Payer: Self-pay | Admitting: Orthopedic Surgery

## 2021-03-16 NOTE — Telephone Encounter (Signed)
Patient left a voice message this afternoon, then called back, to request refill/ discussed office policy of calling before noon on Thursdays - please advise: oxyCODONE (ROXICODONE) 5 MG immediate release tablet 30 tablet      [place], [place]

## 2021-03-17 MED ORDER — OXYCODONE HCL 5 MG PO TABS: 5.0000 mg | ORAL_TABLET | Freq: Four times a day (QID) | ORAL | 0 refills | Status: AC | PRN

## 2021-03-20 ENCOUNTER — Telehealth: Payer: Self-pay | Admitting: Orthopedic Surgery

## 2021-03-20 NOTE — Telephone Encounter (Signed)
Patient called and left message about his pain medicine needing to be refilled.  I called the patient back and I told him they have been called in on 03/17/21 he said no they haven't and he is out of them, he said he has called his pharmacy and they said they don't have it.  I told him to call his pharmacy again and check with them they were called in on 03/17/21.    He is going to call them right now.

## 2021-04-05 ENCOUNTER — Ambulatory Visit: Payer: Medicare Other

## 2021-04-05 ENCOUNTER — Other Ambulatory Visit: Payer: Self-pay

## 2021-04-05 ENCOUNTER — Ambulatory Visit (INDEPENDENT_AMBULATORY_CARE_PROVIDER_SITE_OTHER): Payer: Medicare Other | Admitting: Orthopedic Surgery

## 2021-04-05 ENCOUNTER — Encounter: Payer: Self-pay | Admitting: Orthopedic Surgery

## 2021-04-05 DIAGNOSIS — S72144D Nondisplaced intertrochanteric fracture of right femur, subsequent encounter for closed fracture with routine healing: Secondary | ICD-10-CM

## 2021-04-05 NOTE — Patient Instructions (Signed)
Continue to walk as therapy ? ?You can wean off the cane ? ?Tylenol or ibuprofen as needed ? ?Follow up in 6 weeks ?

## 2021-04-05 NOTE — Progress Notes (Signed)
Orthopaedic Postop Note ? ?Assessment: ?Nicholas King is a 51 y.o. male s/p cephalomedullary nail for right intertrochanteric femur fracture ? ?DOS: 02/24/2021 ? ?Plan: ?Mr Heidinger is doing very well overall.  He is no longer needed pain medicines.  He is walking with a cane.  Radiographs are stable.  Follow up in 6 weeks ? ?Follow-up: ?Return in about 6 weeks (around 05/17/2021). ?XR at next visit: AP pelvis and right femur ? ?Subjective: ? ?Chief Complaint  ?Patient presents with  ? Hip Pain  ?  DOS 02/24/2021 Cephalomedullary nail for Right intertrochanteric femur fracture.  Doing well, occas taking pain medication, walking with cane.   ? ? ?History of Present Illness: ?Nicholas King is a 51 y.o. male who presents following the above stated procedure.  Surgery was 6 weeks ago.  No issues.  He is occasionally taking medications for pain.   ? ?Review of Systems: ?No fevers or chills ?No numbness or tingling ?No Chest Pain ?No shortness of breath ? ? ?Objective: ?There were no vitals taken for this visit. ? ?Physical Exam: ? ?Alert and oriented.  No acute distress. ? ?Ambulates with the assistance of a cane. ? ?Lateral hip incision is healing well.  No tenderness.  No erythema.  He can maintain a SLR.  No pain with gentle range of motion.  No pain with axial loading.  Active motion in TA/EHL ? ?IMAGING: ?I personally ordered and reviewed the following images: ? ?AP pelvis and right femur XR were obtained in clinic today and compared to prior XR.  Intertrochanteric femur fracture remains in good position.  No interval displacement.  There has been interval consolidation.  No evidence of hardware failure or loosening.  No acute injuries.  ? ? ?Impression: Healing right intertrochanteric femur fracture following operative fixation. ? ?Mordecai Rasmussen, MD ?04/05/2021 ?9:13 AM ?  ?

## 2021-05-23 ENCOUNTER — Encounter: Payer: Medicare Other | Admitting: Orthopedic Surgery

## 2021-05-26 ENCOUNTER — Encounter: Payer: Medicare Other | Admitting: Orthopedic Surgery

## 2023-08-02 ENCOUNTER — Other Ambulatory Visit: Payer: Self-pay

## 2023-08-02 ENCOUNTER — Emergency Department (HOSPITAL_COMMUNITY)

## 2023-08-02 ENCOUNTER — Encounter (HOSPITAL_COMMUNITY): Payer: Self-pay | Admitting: *Deleted

## 2023-08-02 ENCOUNTER — Emergency Department (HOSPITAL_COMMUNITY)
Admission: EM | Admit: 2023-08-02 | Discharge: 2023-08-02 | Disposition: A | Discharge status: Home / Self Care | Attending: Emergency Medicine | Admitting: Emergency Medicine

## 2023-08-02 DIAGNOSIS — K529 Noninfective gastroenteritis and colitis, unspecified: Secondary | ICD-10-CM | POA: Insufficient documentation

## 2023-08-02 DIAGNOSIS — E860 Dehydration: Secondary | ICD-10-CM | POA: Insufficient documentation

## 2023-08-02 DIAGNOSIS — R531 Weakness: Secondary | ICD-10-CM | POA: Diagnosis present

## 2023-08-02 LAB — COMPREHENSIVE METABOLIC PANEL WITH GFR
ALT: 41 U/L (ref 0–44)
AST: 26 U/L (ref 15–41)
Albumin: 3.5 g/dL (ref 3.5–5.0)
Alkaline Phosphatase: 83 U/L (ref 38–126)
Anion gap: 12 (ref 5–15)
BUN: 22 mg/dL — ABNORMAL HIGH (ref 6–20)
CO2: 23 mmol/L (ref 22–32)
Calcium: 9.1 mg/dL (ref 8.9–10.3)
Chloride: 102 mmol/L (ref 98–111)
Creatinine, Ser: 0.96 mg/dL (ref 0.61–1.24)
GFR, Estimated: >60 mL/min (ref >60)
Glucose, Bld: 162 mg/dL — ABNORMAL HIGH (ref 70–99)
Potassium: 3.8 mmol/L (ref 3.5–5.1)
Sodium: 137 mmol/L (ref 135–145)
Total Bilirubin: 0.5 mg/dL (ref 0.0–1.2)
Total Protein: 8.3 g/dL — ABNORMAL HIGH (ref 6.5–8.1)

## 2023-08-02 LAB — CBC WITH DIFFERENTIAL/PLATELET
Abs Immature Granulocytes: 0.01 10*3/uL (ref 0.00–0.07)
Basophils Absolute: 0 10*3/uL (ref 0.0–0.1)
Basophils Relative: 0 %
Eosinophils Absolute: 0 10*3/uL (ref 0.0–0.5)
Eosinophils Relative: 1 %
HCT: 36.6 % — ABNORMAL LOW (ref 39.0–52.0)
Hemoglobin: 11 g/dL — ABNORMAL LOW (ref 13.0–17.0)
Immature Granulocytes: 0 %
Lymphocytes Relative: 35 %
Lymphs Abs: 2.3 10*3/uL (ref 0.7–4.0)
MCH: 22.1 pg — ABNORMAL LOW (ref 26.0–34.0)
MCHC: 30.1 g/dL (ref 30.0–36.0)
MCV: 73.6 fL — ABNORMAL LOW (ref 80.0–100.0)
Monocytes Absolute: 0.6 10*3/uL (ref 0.1–1.0)
Monocytes Relative: 9 %
Neutro Abs: 3.6 10*3/uL (ref 1.7–7.7)
Neutrophils Relative %: 55 %
Platelets: 292 10*3/uL (ref 150–400)
RBC: 4.97 MIL/uL (ref 4.22–5.81)
RDW: 18.7 % — ABNORMAL HIGH (ref 11.5–15.5)
WBC: 6.6 10*3/uL (ref 4.0–10.5)
nRBC: 0 % (ref 0.0–0.2)

## 2023-08-02 LAB — TROPONIN I (HIGH SENSITIVITY)
Troponin I (High Sensitivity): 2 ng/L (ref <18)
Troponin I (High Sensitivity): <2 ng/L (ref <18)

## 2023-08-02 LAB — CBG MONITORING, ED: Glucose-Capillary: 153 mg/dL — ABNORMAL HIGH (ref 70–99)

## 2023-08-02 MED ORDER — LACTATED RINGERS IV BOLUS
1000.0000 mL | Freq: Once | INTRAVENOUS | Status: AC
  Administered 2023-08-02: 1000 mL via INTRAVENOUS

## 2023-08-02 MED ORDER — ONDANSETRON 4 MG PO TBDP: 4.0000 mg | ORAL_TABLET | Freq: Three times a day (TID) | ORAL | 0 refills | Status: AC | PRN

## 2023-08-02 NOTE — Discharge Instructions (Addendum)
 Be sure to increase your fluid intake as you appear dehydrated today.  We are also prescribing you a nausea medicine to help with nausea and you may take over-the-counter meds for diarrhea such as Imodium.  If you develop new or worsening weakness, chest pain, abdominal pain, or any other new/concerning symptoms then return to the ER.

## 2023-08-02 NOTE — ED Notes (Signed)
Went over dc papers. Verbalized understanding. Ambulatory to lobby.  

## 2023-08-02 NOTE — ED Provider Notes (Signed)
 [place] EMERGENCY DEPARTMENT AT [facility] Provider Note   CSN: [id number] Arrival date & time: 08/02/23  1826     Patient presents with: Loss of Consciousness   Nicholas King is a 53 y.o. male.  {Add pertinent medical, surgical, social history, OB history to HPI:7216} HPI 53 year old male presents for evaluation of generalized weakness.  He has been feeling this way for a couple days.  He had a sandwich that he was concerned caused vomiting and diarrhea that he has been experiencing.  He feels weak and has some dyspnea on exertion.  He denies any chest pain.  He has been using heroin, as recently as 3 days ago.  Prior to Admission medications   Medication Sig Start Date End Date Taking? Authorizing Provider  acetaminophen  (TYLENOL ) 325 MG tablet Take 2 tablets (650 mg total) by mouth in the morning, at noon, and at bedtime. 02/25/21   Johnson, Clanford L, MD  omeprazole  (PRILOSEC  OTC) 20 MG tablet Take 1 tablet (20 mg total) by mouth daily. 02/25/21   Johnson, Clanford L, MD  oxycodone  (OXY-IR) 5 MG capsule Take 5 mg by mouth every 6 (six) hours as needed.    [provider]    Allergies: Vancomycin , Cymbalta [duloxetine hcl], Penicillins, Amoxicillin , and Hydrocodone     Review of Systems  Constitutional:  Negative for fever.  Cardiovascular:  Negative for chest pain.  Gastrointestinal:  Positive for abdominal pain (cramping), diarrhea, nausea and vomiting.  Neurological:  Positive for weakness. Negative for syncope.    Updated Vital Signs BP (!) 74/55   Pulse 64   Temp (!) 97.5 F (36.4 C) (Temporal)   Resp 16   Ht 5' 7 (1.702 m)   Wt 61.2 kg   SpO2 100%   BMI 21.14 kg/m   Physical Exam Vitals and nursing note reviewed.  Constitutional:      Appearance: He is well-developed.  HENT:     Head: Normocephalic and atraumatic.     Mouth/Throat:     Mouth: Mucous membranes are dry.   Eyes:     Extraocular Movements: Extraocular movements  intact.    Cardiovascular:     Rate and Rhythm: Normal rate and regular rhythm.     Heart sounds: Normal heart sounds. No murmur heard. Pulmonary:     Effort: Pulmonary effort is normal.     Breath sounds: Normal breath sounds.  Abdominal:     General: There is no distension.     Palpations: Abdomen is soft.     Tenderness: There is no abdominal tenderness.   Musculoskeletal:     Cervical back: No rigidity.   Skin:    General: Skin is warm and dry.   Neurological:     Mental Status: He is alert.     Comments: CN 3-12 grossly intact. 5/5 strength in all 4 extremities. Grossly normal sensation. Normal finger to nose.     (all labs ordered are listed, but only abnormal results are displayed) Labs Reviewed  CBG MONITORING, ED - Abnormal; Notable for the following components:      Result Value   Glucose-Capillary 153 (*)    All other components within normal limits  COMPREHENSIVE METABOLIC PANEL WITH GFR  CBC WITH DIFFERENTIAL/PLATELET  I-STAT CHEM 8, ED  TROPONIN I (HIGH SENSITIVITY)    EKG: None  Radiology: No results found.  {Document cardiac monitor, telemetry assessment procedure when appropriate:32947} Procedures   Medications Ordered in the ED  lactated ringers  bolus 1,000 mL (  has no administration in time range)    Clinical Course as of 08/02/23 1909  Fri Aug 02, 2023  1853 I discussed case and EKG with Dr. Wonda.  Does not feel like this is STEMI.  This is most likely not MI, and does not really fit clinically.  We will hold off on calling a STEMI, workup for electrolyte disturbances and other workup for his generalized weakness. [SG]    Clinical Course User Index [SG] Freddi Hamilton, MD   {Click here for ABCD2, HEART and other calculators REFRESH Note before signing:1}                              Medical Decision Making Amount and/or Complexity of Data Reviewed Labs: ordered. Radiology: ordered.   ***  {Document critical care time when  appropriate  Document review of labs and clinical decision tools ie CHADS2VASC2, etc  Document your independent review of radiology images and any outside records  Document your discussion with family members, caretakers and with consultants  Document social determinants of health affecting pt's care  Document your decision making why or why not admission, treatments were needed:32947:::1}   Final diagnoses:  None    ED Discharge Orders     None

## 2023-08-02 NOTE — ED Notes (Signed)
 See triage notes. No peripheral edema noted. Pt states he has track marks on his legs. Last used heroin 3 days ago and has been using every few days for 5 years. Pt denies pain but c/o dizziness and all over weakness. A/o. Color slightly pale. Marks noted to arms/lower legs and left side of neck.

## 2023-08-02 NOTE — ED Notes (Signed)
 Denies needing to urinate. [place] he will call if he needs to go.

## 2023-08-02 NOTE — ED Notes (Signed)
 EDP at bedside

## 2023-08-02 NOTE — ED Triage Notes (Signed)
 Pt states he has passed out x 3 over past several days. Able to find a ride here today. Denies any pain. + SOB with walking.  BP low in triage.

## 2023-08-07 LAB — I-STAT CHEM 8, ED
BUN: 22 mg/dL — ABNORMAL HIGH (ref 6–20)
Calcium, Ion: 1.2 mmol/L (ref 1.15–1.40)
Chloride: 103 mmol/L (ref 98–111)
Creatinine, Ser: 1 mg/dL (ref 0.61–1.24)
Glucose, Bld: 161 mg/dL — ABNORMAL HIGH (ref 70–99)
HCT: 38 % — ABNORMAL LOW (ref 39.0–52.0)
Hemoglobin: 12.9 g/dL — ABNORMAL LOW (ref 13.0–17.0)
Potassium: 3.8 mmol/L (ref 3.5–5.1)
Sodium: 138 mmol/L (ref 135–145)
TCO2: 23 mmol/L (ref 22–32)

## 2023-08-15 DIAGNOSIS — F1120 Opioid dependence, uncomplicated: Secondary | ICD-10-CM | POA: Diagnosis not present

## 2023-08-15 DIAGNOSIS — F17210 Nicotine dependence, cigarettes, uncomplicated: Secondary | ICD-10-CM | POA: Diagnosis not present

## 2023-08-15 DIAGNOSIS — F1520 Other stimulant dependence, uncomplicated: Secondary | ICD-10-CM | POA: Diagnosis not present

## 2023-08-15 DIAGNOSIS — F1021 Alcohol dependence, in remission: Secondary | ICD-10-CM | POA: Diagnosis not present

## 2023-08-16 DIAGNOSIS — F1021 Alcohol dependence, in remission: Secondary | ICD-10-CM | POA: Diagnosis not present

## 2023-08-16 DIAGNOSIS — F17210 Nicotine dependence, cigarettes, uncomplicated: Secondary | ICD-10-CM | POA: Diagnosis not present

## 2023-08-16 DIAGNOSIS — F1120 Opioid dependence, uncomplicated: Secondary | ICD-10-CM | POA: Diagnosis not present

## 2023-08-16 DIAGNOSIS — F1520 Other stimulant dependence, uncomplicated: Secondary | ICD-10-CM | POA: Diagnosis not present

## 2023-08-30 DIAGNOSIS — F1120 Opioid dependence, uncomplicated: Secondary | ICD-10-CM | POA: Diagnosis not present

## 2023-08-30 DIAGNOSIS — F1021 Alcohol dependence, in remission: Secondary | ICD-10-CM | POA: Diagnosis not present

## 2023-08-30 DIAGNOSIS — F1520 Other stimulant dependence, uncomplicated: Secondary | ICD-10-CM | POA: Diagnosis not present

## 2023-08-30 DIAGNOSIS — F17210 Nicotine dependence, cigarettes, uncomplicated: Secondary | ICD-10-CM | POA: Diagnosis not present

## 2023-09-13 DIAGNOSIS — F17210 Nicotine dependence, cigarettes, uncomplicated: Secondary | ICD-10-CM | POA: Diagnosis not present

## 2023-09-13 DIAGNOSIS — F1120 Opioid dependence, uncomplicated: Secondary | ICD-10-CM | POA: Diagnosis not present

## 2023-09-13 DIAGNOSIS — F1520 Other stimulant dependence, uncomplicated: Secondary | ICD-10-CM | POA: Diagnosis not present

## 2023-09-13 DIAGNOSIS — F1021 Alcohol dependence, in remission: Secondary | ICD-10-CM | POA: Diagnosis not present

## 2023-09-27 DIAGNOSIS — F1520 Other stimulant dependence, uncomplicated: Secondary | ICD-10-CM | POA: Diagnosis not present

## 2023-09-27 DIAGNOSIS — F1120 Opioid dependence, uncomplicated: Secondary | ICD-10-CM | POA: Diagnosis not present

## 2023-09-27 DIAGNOSIS — F1021 Alcohol dependence, in remission: Secondary | ICD-10-CM | POA: Diagnosis not present

## 2023-09-27 DIAGNOSIS — F17210 Nicotine dependence, cigarettes, uncomplicated: Secondary | ICD-10-CM | POA: Diagnosis not present
